# Patient Record
Sex: Female | Born: 1935 | Race: Black or African American | Hispanic: No | Smoking: Former smoker
Health system: Southern US, Community
[De-identification: ages and names within clinical notes are randomized; demographics above are authoritative.]

## PROBLEM LIST (undated history)

## (undated) DIAGNOSIS — D509 Iron deficiency anemia, unspecified: Secondary | ICD-10-CM

## (undated) DIAGNOSIS — C189 Malignant neoplasm of colon, unspecified: Secondary | ICD-10-CM

## (undated) DIAGNOSIS — I480 Paroxysmal atrial fibrillation: Secondary | ICD-10-CM

## (undated) DIAGNOSIS — I739 Peripheral vascular disease, unspecified: Secondary | ICD-10-CM

## (undated) DIAGNOSIS — N186 End stage renal disease: Secondary | ICD-10-CM

## (undated) DIAGNOSIS — I10 Essential (primary) hypertension: Secondary | ICD-10-CM

## (undated) DIAGNOSIS — E7800 Pure hypercholesterolemia, unspecified: Secondary | ICD-10-CM

## (undated) DIAGNOSIS — I639 Cerebral infarction, unspecified: Secondary | ICD-10-CM

## (undated) DIAGNOSIS — Z972 Presence of dental prosthetic device (complete) (partial): Secondary | ICD-10-CM

## (undated) DIAGNOSIS — K08109 Complete loss of teeth, unspecified cause, unspecified class: Secondary | ICD-10-CM

## (undated) DIAGNOSIS — K219 Gastro-esophageal reflux disease without esophagitis: Secondary | ICD-10-CM

## (undated) DIAGNOSIS — D72819 Decreased white blood cell count, unspecified: Secondary | ICD-10-CM

## (undated) DIAGNOSIS — Z973 Presence of spectacles and contact lenses: Secondary | ICD-10-CM

## (undated) HISTORY — PX: CATARACT EXTRACTION, BILATERAL: SHX1313

## (undated) HISTORY — DX: Malignant neoplasm of colon, unspecified: C18.9

## (undated) HISTORY — DX: Paroxysmal atrial fibrillation: I48.0

## (undated) HISTORY — DX: Pure hypercholesterolemia, unspecified: E78.00

## (undated) HISTORY — DX: Cerebral infarction, unspecified: I63.9

## (undated) HISTORY — PX: ABDOMINAL HYSTERECTOMY: SHX81

## (undated) HISTORY — PX: MULTIPLE TOOTH EXTRACTIONS: SHX2053

## (undated) HISTORY — DX: Essential (primary) hypertension: I10

## (undated) HISTORY — PX: EYE SURGERY: SHX253

## (undated) HISTORY — DX: Iron deficiency anemia, unspecified: D50.9

## (undated) HISTORY — DX: Decreased white blood cell count, unspecified: D72.819

---

## 2003-08-19 DIAGNOSIS — I639 Cerebral infarction, unspecified: Secondary | ICD-10-CM

## 2003-08-19 HISTORY — DX: Cerebral infarction, unspecified: I63.9

## 2005-02-12 ENCOUNTER — Ambulatory Visit (HOSPITAL_COMMUNITY): Admission: RE | Admit: 2005-02-12 | Discharge: 2005-02-12 | Payer: Self-pay | Admitting: Internal Medicine

## 2005-03-26 ENCOUNTER — Ambulatory Visit (HOSPITAL_COMMUNITY): Admission: RE | Admit: 2005-03-26 | Discharge: 2005-03-26 | Payer: Self-pay | Admitting: Internal Medicine

## 2006-04-22 ENCOUNTER — Ambulatory Visit (HOSPITAL_COMMUNITY): Admission: RE | Admit: 2006-04-22 | Discharge: 2006-04-22 | Payer: Self-pay | Admitting: Internal Medicine

## 2006-08-18 HISTORY — PX: COLONOSCOPY: SHX174

## 2006-08-18 HISTORY — PX: ESOPHAGOGASTRODUODENOSCOPY: SHX1529

## 2006-08-18 HISTORY — PX: HEMICOLECTOMY: SHX854

## 2007-05-06 ENCOUNTER — Ambulatory Visit (HOSPITAL_COMMUNITY): Admission: RE | Admit: 2007-05-06 | Discharge: 2007-05-06 | Payer: Self-pay | Admitting: Internal Medicine

## 2007-05-27 ENCOUNTER — Ambulatory Visit: Payer: Self-pay | Admitting: Internal Medicine

## 2007-05-28 ENCOUNTER — Ambulatory Visit (HOSPITAL_COMMUNITY): Admission: RE | Admit: 2007-05-28 | Discharge: 2007-05-28 | Payer: Self-pay | Admitting: Internal Medicine

## 2007-07-05 ENCOUNTER — Ambulatory Visit: Payer: Self-pay | Admitting: Gastroenterology

## 2007-07-05 ENCOUNTER — Inpatient Hospital Stay (HOSPITAL_COMMUNITY): Admission: AD | Admit: 2007-07-05 | Discharge: 2007-07-13 | Payer: Self-pay | Admitting: Internal Medicine

## 2007-07-06 ENCOUNTER — Encounter: Payer: Self-pay | Admitting: Gastroenterology

## 2007-07-06 ENCOUNTER — Ambulatory Visit: Payer: Self-pay | Admitting: Gastroenterology

## 2007-07-07 ENCOUNTER — Ambulatory Visit: Payer: Self-pay | Admitting: Internal Medicine

## 2007-07-09 ENCOUNTER — Encounter (INDEPENDENT_AMBULATORY_CARE_PROVIDER_SITE_OTHER): Payer: Self-pay | Admitting: General Surgery

## 2007-08-17 ENCOUNTER — Encounter (HOSPITAL_COMMUNITY): Admission: RE | Admit: 2007-08-17 | Discharge: 2007-08-18 | Payer: Self-pay | Admitting: Oncology

## 2007-08-17 ENCOUNTER — Ambulatory Visit (HOSPITAL_COMMUNITY): Payer: Self-pay | Admitting: Oncology

## 2007-08-19 HISTORY — PX: ESOPHAGOGASTRODUODENOSCOPY: SHX1529

## 2007-08-19 HISTORY — PX: COLONOSCOPY: SHX174

## 2007-08-19 HISTORY — PX: GIVENS CAPSULE STUDY: SHX5432

## 2007-08-24 ENCOUNTER — Ambulatory Visit (HOSPITAL_COMMUNITY): Admission: RE | Admit: 2007-08-24 | Discharge: 2007-08-24 | Payer: Self-pay | Admitting: Oncology

## 2007-08-30 ENCOUNTER — Encounter (HOSPITAL_COMMUNITY): Admission: RE | Admit: 2007-08-30 | Discharge: 2007-09-29 | Payer: Self-pay | Admitting: Oncology

## 2007-11-16 ENCOUNTER — Ambulatory Visit (HOSPITAL_COMMUNITY): Payer: Self-pay | Admitting: Oncology

## 2007-11-16 ENCOUNTER — Encounter (HOSPITAL_COMMUNITY): Admission: RE | Admit: 2007-11-16 | Discharge: 2007-12-16 | Payer: Self-pay | Admitting: Oncology

## 2007-12-24 ENCOUNTER — Ambulatory Visit: Payer: Self-pay | Admitting: Gastroenterology

## 2008-05-23 ENCOUNTER — Ambulatory Visit (HOSPITAL_COMMUNITY): Payer: Self-pay | Admitting: Oncology

## 2008-05-23 ENCOUNTER — Ambulatory Visit (HOSPITAL_COMMUNITY): Admission: RE | Admit: 2008-05-23 | Discharge: 2008-05-23 | Payer: Self-pay | Admitting: Internal Medicine

## 2008-07-03 ENCOUNTER — Ambulatory Visit: Payer: Self-pay | Admitting: Gastroenterology

## 2008-07-04 ENCOUNTER — Encounter: Payer: Self-pay | Admitting: Gastroenterology

## 2008-07-04 LAB — CONVERTED CEMR LAB
Basophils Relative: 1 % (ref 0–1)
Eosinophils Absolute: 0.2 10*3/uL (ref 0.0–0.7)
Lymphocytes Relative: 34 % (ref 12–46)
Lymphs Abs: 1.1 10*3/uL (ref 0.7–4.0)
MCHC: 29.1 g/dL — ABNORMAL LOW (ref 30.0–36.0)
MCV: 76.2 fL — ABNORMAL LOW (ref 78.0–100.0)
Neutro Abs: 1.7 10*3/uL (ref 1.7–7.7)
Platelets: 203 10*3/uL (ref 150–400)
RDW: 17.8 % — ABNORMAL HIGH (ref 11.5–15.5)
WBC: 3.2 10*3/uL — ABNORMAL LOW (ref 4.0–10.5)

## 2008-07-20 ENCOUNTER — Ambulatory Visit (HOSPITAL_COMMUNITY): Admission: RE | Admit: 2008-07-20 | Discharge: 2008-07-20 | Payer: Self-pay | Admitting: Gastroenterology

## 2008-07-20 ENCOUNTER — Encounter: Payer: Self-pay | Admitting: Gastroenterology

## 2008-07-20 ENCOUNTER — Ambulatory Visit: Payer: Self-pay | Admitting: Gastroenterology

## 2008-07-27 ENCOUNTER — Ambulatory Visit (HOSPITAL_COMMUNITY): Admission: RE | Admit: 2008-07-27 | Discharge: 2008-07-27 | Payer: Self-pay | Admitting: Gastroenterology

## 2008-07-28 ENCOUNTER — Ambulatory Visit: Payer: Self-pay | Admitting: Gastroenterology

## 2008-08-14 ENCOUNTER — Ambulatory Visit (HOSPITAL_COMMUNITY): Payer: Self-pay | Admitting: Oncology

## 2008-08-14 ENCOUNTER — Encounter (HOSPITAL_COMMUNITY): Admission: RE | Admit: 2008-08-14 | Discharge: 2008-09-13 | Payer: Self-pay | Admitting: Oncology

## 2008-08-18 HISTORY — PX: COLON SURGERY: SHX602

## 2008-10-09 ENCOUNTER — Encounter (HOSPITAL_COMMUNITY): Admission: RE | Admit: 2008-10-09 | Discharge: 2008-11-08 | Payer: Self-pay | Admitting: Oncology

## 2008-10-10 ENCOUNTER — Ambulatory Visit (HOSPITAL_COMMUNITY): Payer: Self-pay | Admitting: Oncology

## 2008-10-30 DIAGNOSIS — I10 Essential (primary) hypertension: Secondary | ICD-10-CM | POA: Insufficient documentation

## 2008-10-30 DIAGNOSIS — C189 Malignant neoplasm of colon, unspecified: Secondary | ICD-10-CM

## 2008-10-30 DIAGNOSIS — E119 Type 2 diabetes mellitus without complications: Secondary | ICD-10-CM

## 2008-10-30 DIAGNOSIS — Z87891 Personal history of nicotine dependence: Secondary | ICD-10-CM

## 2008-10-30 HISTORY — DX: Malignant neoplasm of colon, unspecified: C18.9

## 2008-11-01 ENCOUNTER — Encounter (INDEPENDENT_AMBULATORY_CARE_PROVIDER_SITE_OTHER): Payer: Self-pay | Admitting: *Deleted

## 2008-11-22 ENCOUNTER — Ambulatory Visit: Payer: Self-pay | Admitting: Gastroenterology

## 2008-11-22 DIAGNOSIS — K649 Unspecified hemorrhoids: Secondary | ICD-10-CM | POA: Insufficient documentation

## 2008-11-22 DIAGNOSIS — R319 Hematuria, unspecified: Secondary | ICD-10-CM

## 2008-11-23 ENCOUNTER — Encounter: Payer: Self-pay | Admitting: Gastroenterology

## 2008-11-23 LAB — CONVERTED CEMR LAB
Specific Gravity, Urine: 1.02 (ref 1.005–1.030)
Urobilinogen, UA: 0.2 (ref 0.0–1.0)
WBC, UA: NONE SEEN cells/hpf (ref <3)
pH: 5.5 (ref 5.0–8.0)

## 2008-12-01 ENCOUNTER — Encounter: Payer: Self-pay | Admitting: Gastroenterology

## 2008-12-04 ENCOUNTER — Ambulatory Visit (HOSPITAL_COMMUNITY): Admission: RE | Admit: 2008-12-04 | Discharge: 2008-12-04 | Payer: Self-pay | Admitting: Urology

## 2008-12-25 ENCOUNTER — Ambulatory Visit (HOSPITAL_COMMUNITY): Admission: RE | Admit: 2008-12-25 | Discharge: 2008-12-26 | Payer: Self-pay | Admitting: Urology

## 2008-12-25 ENCOUNTER — Encounter (INDEPENDENT_AMBULATORY_CARE_PROVIDER_SITE_OTHER): Payer: Self-pay | Admitting: Urology

## 2009-02-12 ENCOUNTER — Ambulatory Visit (HOSPITAL_COMMUNITY): Admission: RE | Admit: 2009-02-12 | Discharge: 2009-02-12 | Payer: Self-pay | Admitting: Urology

## 2009-02-12 ENCOUNTER — Encounter (INDEPENDENT_AMBULATORY_CARE_PROVIDER_SITE_OTHER): Payer: Self-pay | Admitting: Urology

## 2009-03-06 ENCOUNTER — Encounter: Payer: Self-pay | Admitting: Gastroenterology

## 2009-10-09 ENCOUNTER — Ambulatory Visit (HOSPITAL_COMMUNITY): Payer: Self-pay | Admitting: Oncology

## 2009-10-09 ENCOUNTER — Encounter (HOSPITAL_COMMUNITY): Admission: RE | Admit: 2009-10-09 | Discharge: 2009-11-08 | Payer: Self-pay | Admitting: Oncology

## 2009-11-06 ENCOUNTER — Encounter (HOSPITAL_COMMUNITY): Admission: RE | Admit: 2009-11-06 | Discharge: 2009-12-06 | Payer: Self-pay | Admitting: Oncology

## 2009-12-04 ENCOUNTER — Ambulatory Visit (HOSPITAL_COMMUNITY): Payer: Self-pay | Admitting: Oncology

## 2010-01-30 ENCOUNTER — Encounter: Payer: Self-pay | Admitting: Gastroenterology

## 2010-04-10 ENCOUNTER — Encounter (HOSPITAL_COMMUNITY): Admission: RE | Admit: 2010-04-10 | Discharge: 2010-05-10 | Payer: Self-pay | Admitting: Oncology

## 2010-04-10 ENCOUNTER — Ambulatory Visit (HOSPITAL_COMMUNITY): Payer: Self-pay | Admitting: Oncology

## 2010-07-02 ENCOUNTER — Encounter (HOSPITAL_COMMUNITY)
Admission: RE | Admit: 2010-07-02 | Discharge: 2010-08-01 | Payer: Self-pay | Source: Home / Self Care | Attending: Oncology | Admitting: Oncology

## 2010-07-02 ENCOUNTER — Ambulatory Visit (HOSPITAL_COMMUNITY): Payer: Self-pay | Admitting: Oncology

## 2010-07-08 ENCOUNTER — Ambulatory Visit (HOSPITAL_COMMUNITY): Admission: RE | Admit: 2010-07-08 | Discharge: 2010-07-08 | Payer: Self-pay | Admitting: Oncology

## 2010-07-18 ENCOUNTER — Encounter: Payer: Self-pay | Admitting: Gastroenterology

## 2010-09-07 ENCOUNTER — Other Ambulatory Visit (HOSPITAL_COMMUNITY): Payer: Self-pay | Admitting: Oncology

## 2010-09-07 ENCOUNTER — Encounter: Payer: Self-pay | Admitting: Internal Medicine

## 2010-09-07 DIAGNOSIS — M8580 Other specified disorders of bone density and structure, unspecified site: Secondary | ICD-10-CM

## 2010-09-07 DIAGNOSIS — Z859 Personal history of malignant neoplasm, unspecified: Secondary | ICD-10-CM

## 2010-09-07 DIAGNOSIS — D509 Iron deficiency anemia, unspecified: Secondary | ICD-10-CM

## 2010-09-08 ENCOUNTER — Encounter: Payer: Self-pay | Admitting: Internal Medicine

## 2010-09-08 ENCOUNTER — Encounter (HOSPITAL_COMMUNITY): Payer: Self-pay | Admitting: Oncology

## 2010-09-17 NOTE — Letter (Signed)
Summary: [facility]  [facility]   Imported By: Rexene Alberts 07/18/2010 09:44:05  _____________________________________________________________________  External Attachment:    Type:   Image     Comment:   External Document

## 2010-09-17 NOTE — Letter (Signed)
Summary: External Other  External Other   Imported By: Peggyann Shoals 01/30/2010 14:13:03  _____________________________________________________________________  External Attachment:    Type:   Image     Comment:   External Document

## 2010-10-01 ENCOUNTER — Other Ambulatory Visit (HOSPITAL_COMMUNITY): Payer: Medicare Other

## 2010-10-01 ENCOUNTER — Encounter (HOSPITAL_COMMUNITY): Payer: Medicare Other | Attending: Oncology

## 2010-10-01 DIAGNOSIS — E119 Type 2 diabetes mellitus without complications: Secondary | ICD-10-CM | POA: Insufficient documentation

## 2010-10-01 DIAGNOSIS — D509 Iron deficiency anemia, unspecified: Secondary | ICD-10-CM | POA: Insufficient documentation

## 2010-10-01 DIAGNOSIS — Z85038 Personal history of other malignant neoplasm of large intestine: Secondary | ICD-10-CM | POA: Insufficient documentation

## 2010-10-01 DIAGNOSIS — C180 Malignant neoplasm of cecum: Secondary | ICD-10-CM

## 2010-10-04 ENCOUNTER — Ambulatory Visit (HOSPITAL_COMMUNITY): Payer: Medicare Other | Admitting: Oncology

## 2010-10-04 DIAGNOSIS — D509 Iron deficiency anemia, unspecified: Secondary | ICD-10-CM

## 2010-10-04 DIAGNOSIS — C180 Malignant neoplasm of cecum: Secondary | ICD-10-CM

## 2010-10-29 LAB — CBC
HCT: 31.1 % — ABNORMAL LOW (ref 36.0–46.0)
Hemoglobin: 10.1 g/dL — ABNORMAL LOW (ref 12.0–15.0)
MCH: 26.6 pg (ref 26.0–34.0)
MCHC: 32.5 g/dL (ref 30.0–36.0)
MCV: 82 fL (ref 78.0–100.0)
Platelets: 197 10*3/uL (ref 150–400)
RBC: 3.8 MIL/uL — ABNORMAL LOW (ref 3.87–5.11)

## 2010-10-29 LAB — IRON AND TIBC
Saturation Ratios: 20 % (ref 20–55)
UIBC: 347 ug/dL

## 2010-10-31 LAB — CEA: CEA: 8.5 ng/mL — ABNORMAL HIGH (ref 0.0–5.0)

## 2010-11-01 LAB — IRON AND TIBC
Iron: 97 ug/dL (ref 42–135)
Saturation Ratios: 23 % (ref 20–55)
TIBC: 421 ug/dL (ref 250–470)

## 2010-11-01 LAB — CBC
RDW: 15.1 % (ref 11.5–15.5)
WBC: 4.2 10*3/uL (ref 4.0–10.5)

## 2010-11-01 LAB — CEA: CEA: 12.4 ng/mL — ABNORMAL HIGH (ref 0.0–5.0)

## 2010-11-01 LAB — FERRITIN: Ferritin: 279 ng/mL (ref 10–291)

## 2010-11-05 LAB — CBC
MCHC: 32.8 g/dL (ref 30.0–36.0)
MCV: 80.3 fL (ref 78.0–100.0)
RBC: 4.01 MIL/uL (ref 3.87–5.11)
WBC: 4.1 10*3/uL (ref 4.0–10.5)

## 2010-11-05 LAB — FERRITIN: Ferritin: 363 ng/mL — ABNORMAL HIGH (ref 10–291)

## 2010-11-05 LAB — IRON AND TIBC
Iron: 96 ug/dL (ref 42–135)
Saturation Ratios: 23 % (ref 20–55)
TIBC: 426 ug/dL (ref 250–470)

## 2010-11-06 LAB — CBC
Hemoglobin: 10.2 g/dL — ABNORMAL LOW (ref 12.0–15.0)
MCHC: 32.2 g/dL (ref 30.0–36.0)
RDW: 15.2 % (ref 11.5–15.5)

## 2010-11-06 LAB — IRON AND TIBC: Iron: 60 ug/dL (ref 42–135)

## 2010-11-06 LAB — FERRITIN: Ferritin: 31 ng/mL (ref 10–291)

## 2010-11-25 LAB — CBC
HCT: 31.1 % — ABNORMAL LOW (ref 36.0–46.0)
MCV: 78.2 fL (ref 78.0–100.0)
RBC: 3.97 MIL/uL (ref 3.87–5.11)
WBC: 3.9 10*3/uL — ABNORMAL LOW (ref 4.0–10.5)

## 2010-11-25 LAB — BASIC METABOLIC PANEL
BUN: 32 mg/dL — ABNORMAL HIGH (ref 6–23)
Chloride: 111 mEq/L (ref 96–112)
Potassium: 4 mEq/L (ref 3.5–5.1)

## 2010-11-26 LAB — BASIC METABOLIC PANEL
BUN: 12 mg/dL (ref 6–23)
BUN: 27 mg/dL — ABNORMAL HIGH (ref 6–23)
Chloride: 111 mEq/L (ref 96–112)
Chloride: 114 mEq/L — ABNORMAL HIGH (ref 96–112)
Glucose, Bld: 109 mg/dL — ABNORMAL HIGH (ref 70–99)
Glucose, Bld: 131 mg/dL — ABNORMAL HIGH (ref 70–99)
Potassium: 3.4 mEq/L — ABNORMAL LOW (ref 3.5–5.1)
Potassium: 4.1 mEq/L (ref 3.5–5.1)

## 2010-11-26 LAB — CBC
HCT: 30.4 % — ABNORMAL LOW (ref 36.0–46.0)
HCT: 32.1 % — ABNORMAL LOW (ref 36.0–46.0)
MCV: 76.8 fL — ABNORMAL LOW (ref 78.0–100.0)
MCV: 78 fL (ref 78.0–100.0)
Platelets: 153 10*3/uL (ref 150–400)
Platelets: 188 10*3/uL (ref 150–400)
RDW: 16.2 % — ABNORMAL HIGH (ref 11.5–15.5)
RDW: 16.3 % — ABNORMAL HIGH (ref 11.5–15.5)
WBC: 3.9 10*3/uL — ABNORMAL LOW (ref 4.0–10.5)

## 2010-12-03 LAB — CBC
HCT: 33.2 % — ABNORMAL LOW (ref 36.0–46.0)
Hemoglobin: 10.9 g/dL — ABNORMAL LOW (ref 12.0–15.0)
MCV: 75.8 fL — ABNORMAL LOW (ref 78.0–100.0)
Platelets: 203 10*3/uL (ref 150–400)
WBC: 3 10*3/uL — ABNORMAL LOW (ref 4.0–10.5)

## 2010-12-11 ENCOUNTER — Ambulatory Visit (HOSPITAL_COMMUNITY)
Admission: RE | Admit: 2010-12-11 | Discharge: 2010-12-11 | Disposition: A | Discharge status: Home / Self Care | Payer: Medicare Other | Source: Ambulatory Visit | Attending: Internal Medicine | Admitting: Internal Medicine

## 2010-12-11 ENCOUNTER — Other Ambulatory Visit (HOSPITAL_COMMUNITY): Payer: Self-pay | Admitting: Internal Medicine

## 2010-12-11 DIAGNOSIS — M25439 Effusion, unspecified wrist: Secondary | ICD-10-CM | POA: Insufficient documentation

## 2010-12-11 DIAGNOSIS — M25539 Pain in unspecified wrist: Secondary | ICD-10-CM | POA: Insufficient documentation

## 2010-12-11 DIAGNOSIS — M899 Disorder of bone, unspecified: Secondary | ICD-10-CM | POA: Insufficient documentation

## 2010-12-11 DIAGNOSIS — M25639 Stiffness of unspecified wrist, not elsewhere classified: Secondary | ICD-10-CM | POA: Insufficient documentation

## 2010-12-11 DIAGNOSIS — M25531 Pain in right wrist: Secondary | ICD-10-CM

## 2010-12-11 DIAGNOSIS — M19039 Primary osteoarthritis, unspecified wrist: Secondary | ICD-10-CM | POA: Insufficient documentation

## 2010-12-25 ENCOUNTER — Other Ambulatory Visit (HOSPITAL_COMMUNITY): Payer: Self-pay | Admitting: Oncology

## 2010-12-25 ENCOUNTER — Encounter (HOSPITAL_COMMUNITY): Payer: Medicare Other

## 2010-12-25 ENCOUNTER — Encounter (HOSPITAL_COMMUNITY): Payer: Medicare Other | Admitting: Oncology

## 2010-12-25 DIAGNOSIS — Z85038 Personal history of other malignant neoplasm of large intestine: Secondary | ICD-10-CM | POA: Insufficient documentation

## 2010-12-25 DIAGNOSIS — E119 Type 2 diabetes mellitus without complications: Secondary | ICD-10-CM | POA: Insufficient documentation

## 2010-12-25 DIAGNOSIS — Z79899 Other long term (current) drug therapy: Secondary | ICD-10-CM | POA: Insufficient documentation

## 2010-12-25 DIAGNOSIS — D509 Iron deficiency anemia, unspecified: Secondary | ICD-10-CM | POA: Insufficient documentation

## 2010-12-25 DIAGNOSIS — C180 Malignant neoplasm of cecum: Secondary | ICD-10-CM

## 2010-12-25 LAB — CBC
Hemoglobin: 9.7 g/dL — ABNORMAL LOW (ref 12.0–15.0)
MCH: 24.9 pg — ABNORMAL LOW (ref 26.0–34.0)
MCHC: 31 g/dL (ref 30.0–36.0)
MCV: 80.5 fL (ref 78.0–100.0)

## 2010-12-25 LAB — IRON AND TIBC: UIBC: 285 ug/dL

## 2010-12-25 LAB — FERRITIN: Ferritin: 269 ng/mL (ref 10–291)

## 2010-12-25 LAB — URIC ACID: Uric Acid, Serum: 10 mg/dL — ABNORMAL HIGH (ref 2.4–7.0)

## 2010-12-25 LAB — CEA: CEA: 10.2 ng/mL — ABNORMAL HIGH (ref 0.0–5.0)

## 2010-12-27 ENCOUNTER — Ambulatory Visit (HOSPITAL_COMMUNITY): Payer: Medicare Other

## 2010-12-27 ENCOUNTER — Other Ambulatory Visit (HOSPITAL_COMMUNITY): Payer: Self-pay | Admitting: Oncology

## 2010-12-27 ENCOUNTER — Encounter (HOSPITAL_COMMUNITY): Payer: Medicare Other | Attending: Oncology | Admitting: Oncology

## 2010-12-27 DIAGNOSIS — R634 Abnormal weight loss: Secondary | ICD-10-CM

## 2010-12-27 DIAGNOSIS — C180 Malignant neoplasm of cecum: Secondary | ICD-10-CM

## 2010-12-27 DIAGNOSIS — C801 Malignant (primary) neoplasm, unspecified: Secondary | ICD-10-CM

## 2010-12-27 LAB — CREATININE, SERUM
Creatinine, Ser: 3.19 mg/dL — ABNORMAL HIGH (ref 0.4–1.2)
GFR calc Af Amer: 17 mL/min — ABNORMAL LOW (ref >60)

## 2010-12-31 ENCOUNTER — Other Ambulatory Visit (HOSPITAL_COMMUNITY): Payer: Medicare Other

## 2010-12-31 NOTE — Op Note (Signed)
NAME:  DREYA, BUHRMAN              ACCOUNT NO.:  [id number]   MEDICAL RECORD NO.:  [id number]          PATIENT TYPE:  AMB   LOCATION:  DAY                           FACILITY:  [facility]   PHYSICIAN:  Kassie Mends, M.D.      DATE OF BIRTH:  [birth year]   DATE OF PROCEDURE:  DATE OF DISCHARGE:                               OPERATIVE REPORT   REFERRING Annajulia Lewing:  Tesfaye D. Felecia Shelling, MD   PRIMARY ONCOLOGIST:  Ladona Horns. Neijstrom, MD   PROCEDURE:  1. Ileocolonoscopy with snare cautery polypectomy.  2. Esophagogastroduodenoscopy with cold forceps biopsy of the gastric      and duodenal mucosa.   INDICATION FOR EXAM:  [name] is a 77 year old female who continues  to have intermittent rectal bleeding.  She was diagnosed with colon  cancer in November 2008.  She also was noted to have microcytic anemia.  Her hemoglobin was noted to be 9.8 with an MCV of 73.9.  She denies any  problem swallowing, nausea, vomiting, or abdominal pain.  She is  chronically maintained on aspirin 81 mg daily with no PPI or H2 blocker.   FINDINGS:  1. Normal neoterminal ileum, approximately 20 cm visualized.  2. A 6-mm sessile rectal polyp removed via snare cautery.  3. Rare sigmoid colon diverticula.  Otherwise, no masses, inflammatory      changes, or AVMs seen.  4. Normal anastomosis.  5. Normal esophagus without evidence of Barrett's, mass, erosion,      ulceration, or stricture.  6. Multiple erosions seen in the antrum with occasional evidence of      oozing.  No ulcerations.  Biopsies obtained via cold forceps to      evaluate for H. pylori gastritis.  7. Normal duodenal bulb and second portion of the duodenum.  Biopsies      obtained via cold forceps to evaluate for celiac sprue as an      etiology for microcytic anemia.  8. Moderate internal hemorrhoids.  Otherwise, normal retroflex view of      the rectum.   DIAGNOSES:  1. Rectal polyp.  2. Mild sigmoid colon diverticulosis.  3. Moderate internal  hemorrhoids.  4. Moderate gastritis.   RECOMMENDATIONS:  1. Will call Ms. Apgar with the results of her biopsies.  2. She should stop aspirin until August 21, 2008.  She should begin      Prilosec 20 mg daily and continue it indefinitely while taking      aspirin.  3. We will measure her ferritin today.  4. No NSAIDs for 14 days.  No anticoagulation for 5 days.  5. She was given a handout on gastritis, high-fiber diet, polyps,      diverticulosis, and hemorrhoids.   MEDICATIONS:  1. Demerol 75 mg IV.  2. Versed 5 mg IV.   PROCEDURE TECHNIQUE:  Physical exam was performed.  Informed consent was  obtained from the patient after explaining the benefits, risks, and  alternatives to the procedure.  The patient was connected to the monitor  and placed in the left lateral position.  Continuous oxygen was provided  by nasal cannula.  IV medicine administered with an indwelling cannula.  After administration of sedation and rectal exam, the patient's rectum  was intubated, and the scope was advanced under direct visualization to  the neoterminal ileum.  The scope was removed slowly by carefully  examining the color, texture, anatomy, and integrity of the mucosa on  the way out.   After the colonoscopy, the patient's esophagus was intubated with a  diagnostic gastroscope, and the scope was advanced under direct  visualization to the second portion of the duodenum.  The scope was  removed slowly by carefully examining the color, texture, anatomy, and  integrity of the mucosa on the way out.  The patient was recovered in  Endoscopy and discharged home in satisfactory condition.   PATH:  Benign colonic mucosa. Reactive gastropathy. Nl duodenum.      Kassie Mends, M.D.  Electronically Signed     SM/MEDQ  D:  07/20/2008  T:  07/20/2008  Job:  [id number]   cc:   Ladona Horns. Mariel Sleet, MD  Fax: [phone]   Tesfaye D. Felecia Shelling, MD  Fax: [phone]

## 2010-12-31 NOTE — Consult Note (Signed)
NAMECHANON, Destiny Boyd              ACCOUNT NO.:  [id number]   MEDICAL RECORD NO.:  [id number]          PATIENT TYPE:  INP   LOCATION:  IC04                          FACILITY:  [facility]   PHYSICIAN:  Dalia Heading, M.D.  DATE OF BIRTH:  [birth year]   DATE OF CONSULTATION:  07/08/2007  DATE OF DISCHARGE:                                 CONSULTATION   REASON FOR CONSULTATION:  Cecal carcinoma.   HISTORY OF PRESENT ILLNESS:  The patient is a 77 year old black female  who presented to the emergency department with generalized malaise, was  noted to be severely anemic.  She underwent a colonoscopy by Dr. Cira Servant  and was found to have a cecal mass.  This was biopsy positive for  adenocarcinoma.  Since her colonoscopy, she has started having episodes  of hematochezia.  She had one episode of the drop in blood pressure,  thus she was transferred to the intensive care unit.  While in the  intensive care unit, her blood pressure and pulse have been stable.  She  has had several more episodes of hematochezia.  The patient states that  she feels okay and denies any significant abdominal pain, nausea,  vomiting.   PAST MEDICAL HISTORY:  Includes hypertension and insulin-dependent  diabetes mellitus.   PAST SURGICAL HISTORY:  Hysterectomy.   CURRENT MEDICATIONS:  As listed on the chart.   ALLERGIES:  NO KNOWN DRUG ALLERGIES.   REVIEW OF SYSTEMS:  The patient denies drinking or smoking.  She denies  any recent chest pain, MI, CVA, or known bleeding disorders.   PHYSICAL EXAMINATION:  GENERAL:  The patient is a pleasant black female  in no acute distress.  VITAL SIGNS:  Blood pressure is 115/70, pulse 70, respirations 18.  ABDOMEN:  Her abdomen is soft.  Periumbilical incisional hernia is  present.  No hepatosplenomegaly, masses, rigidity are noted.   LABORATORY DATA:  White blood cell count 9.1, hemoglobin 7.8, hematocrit  24.3, platelet count is now 104, though it had been as low as 68.  __________  was remarkable for potassium of 3.3, BUN 22, creatinine 1.4  CEA level is pending.  Liver profile done at the time of admission was  unremarkable.   IMPRESSION:  Cecal mass with post procedure hemorrhage.  She appears to  be stable at this point.   PLAN:  I have ordered a transfusion of platelets which are arriving from  another city in the next few hours.  I would like to see her hemoglobin  up to 10.  She will need a right hemicolectomy as well as incisional  herniorrhaphy.  This  will be planned for tomorrow.  I did make aware to the patient that she  does have a cancer.  The risks and benefits of the procedure including  bleeding, infection and cardiopulmonary difficulties were fully  explained to the patient, who gave informed consent.      Dalia Heading, M.D.  Electronically Signed     MAJ/MEDQ  D:  07/08/2007  T:  07/08/2007  Job:  [id number]   cc:   Tesfaye D.  Felecia Shelling, MD  Fax: [phone]   Kassie Mends, M.D.  [address]  [place] , [place] [address]

## 2010-12-31 NOTE — Op Note (Signed)
Destiny Boyd, Destiny Boyd              ACCOUNT NO.:  [id number]   MEDICAL RECORD NO.:  [id number]          PATIENT TYPE:  AMB   LOCATION:  DAY                           FACILITY:  [facility]   PHYSICIAN:  Dennie Maizes, M.D.   DATE OF BIRTH:  April 10, 1934   DATE OF PROCEDURE:  02/12/2009  DATE OF DISCHARGE:                               OPERATIVE REPORT   PREOPERATIVE DIAGNOSIS:  Carcinoma of bladder.   POSTOPERATIVE DIAGNOSIS:  Carcinoma of bladder.   OPERATIVE PROCEDURE:  Cystoscopy, biopsy of bladder tumor, and  fulguration of bladder tumor.   ANESTHESIA:  General.   SURGEON:  Dennie Maizes, MD   COMPLICATIONS:  None.   ESTIMATED BLOOD LOSS:  Minimal.   DRAINS:  18-French Foley catheter in the bladder.   SPECIMEN:  Bladder tumor.   INDICATIONS FOR THE PROCEDURE:  This 77 year old female was evaluated  for intermittent gross hematuria and found to have 2.5 cm size papillary  tumor on the post lateral wall of the bladder on the right side.  She  has undergone transurethral  resection of bladder tumor about a month  ago.  This was done under spinal anesthesia and deep resection could not  be done as the patient had obturator nerve stimulation and adductor  spasms at that time.  The patient was brought back to the operating room  today for repeat transurethral  resection of bladder tumor under general  anesthesia with muscle relaxation.   DESCRIPTION OF THE PROCEDURE:  General anesthesia was induced and muscle  relaxants were given.  The patient was then placed on the table in the  dorsal lithotomy position.  The lower abdomen and genitalia were prepped  and draped in a sterile fashion.  Cystoscopy was done with 25-French  scope.  The trigone ureteral orifices were normal.  There was some  residual tumor on the right lateral wall and the posterior side about 1  cm in size.  The tumor was flat and not amendable for transurethral  resection.  Cold cup biopsies of the lesion was done  x2.  The cystoscope  was then removed.   A 28-French  resectoscope with continuous bladder irrigation was then  inserted into the bladder.  The biopsy sites were fulgurated thoroughly.  The tumor was thoroughly fulgurated.  There was no active bleeding at  this time.  The rest of the bladder mucosa was normal.  A 18-French  Foley catheter was inserted into the bladder.  The patient was  transferred to the PACU in a satisfactory condition.      Dennie Maizes, M.D.  Electronically Signed     SK/MEDQ  D:  02/12/2009  T:  02/12/2009  Job:  [id number]   cc:   Tesfaye D. Felecia Shelling, MD  Fax: [phone]   Kassie Mends, M.D.  [address]  [place] , [place] [address]

## 2010-12-31 NOTE — Op Note (Signed)
[name], Destiny Boyd              ACCOUNT NO.:  [id number]   MEDICAL RECORD NO.:  [id number]          PATIENT TYPE:  INP   LOCATION:  A301                          FACILITY:  [facility]   PHYSICIAN:  Kassie Mends, M.D.      DATE OF BIRTH:  1934-04-01   DATE OF PROCEDURE:  07/06/2007  DATE OF DISCHARGE:                               OPERATIVE REPORT   REFERRING PHYSICIAN:  Tesfaye D. Felecia Shelling, MD   PROCEDURES:  1. Colonoscopy with snare cautery polypectomy.  2. Esophagogastroduodenoscopy.   FINDINGS:  1. A 3-4 cm cecal polyp, sessile, which was partially removed via      snare cautery.  Otherwise, no inflammatory changes, diverticula or      AVMs.  2. Normal retroflexed view of the rectum.  3. Normal esophagus without evidence of Barrett's, mass, erosions,      ulceration or stricture.  4. Normal stomach, duodenal bulb and second portion of the duodenum.   DIAGNOSIS:  Ms. Boot profound iron-deficiency anemia is likely  secondary to her cecal polyp.   RECOMMENDATIONS:  1. Advance diet.  2. Avoid aspirin until her polyp is removed.  3. No NSAIDs or anticoagulation for 7 days.  4. Will call [name] with the results of her biopsies.  If her      biopsies show an adenomatous polyp, then she will be referred to      [facility] for endoscopic      mucosal resection.  If her polyp shows cecal adenocarcinoma, then      she should have a right hemicolectomy.   PROCEDURE TECHNIQUE:  Physical exam was performed and informed consent  was obtained from the patient after explaining the benefits, risks and  alternatives to the procedure.  The patient was connected to the monitor  and placed in the left lateral position.  Continuous oxygen was provided  by nasal cannula and IV medicine administered through an indwelling  cannula.  After administration of sedation and rectal exam, the  patient's rectum was intubated and the scope was advanced under  direct  visualization to the cecum.  The scope was removed slowly by carefully  examining the color, texture, anatomy and integrity of the mucosa on the  way out.   After the colonoscopy, the patient's esophagus was intubated and the  diagnostic gastroscope was passed under direct visualization to the  second portion of the duodenum.  The scope was removed slowly by  carefully examining the color, texture, anatomy and integrity  of the  mucosa on the way out.  The patient was recovered in endoscopy and  discharged to the floor in satisfactory condition.      Kassie Mends, M.D.  Electronically Signed     SM/MEDQ  D:  07/06/2007  T:  07/06/2007  Job:  [id number]

## 2010-12-31 NOTE — Op Note (Signed)
NAMEMERION, CATON              ACCOUNT NO.:  [id number]   MEDICAL RECORD NO.:  [id number]          PATIENT TYPE:  OIB   LOCATION:  A314                          FACILITY:  [facility]   PHYSICIAN:  Dennie Maizes, M.D.   DATE OF BIRTH:  [birth year]   DATE OF PROCEDURE:  DATE OF DISCHARGE:                               OPERATIVE REPORT   PREOPERATIVE DIAGNOSIS:  Hematuria, bladder tumor (2.5 cm).   POSTOPERATIVE DIAGNOSIS:  Hematuria, bladder tumor (2.5 cm).   OPERATIVE PROCEDURE:  Cystoscopy, multiple bladder biopsies, and  transurerthral  resection of bladder tumor (2.5 cm).   ANESTHESIA:  Spinal.   SURGEON:  Dennie Maizes, MD   COMPLICATIONS:  None.   ESTIMATED BLOOD LOSS:  Minimal.   DRAINS:  A 22-French triple-lumen Foley catheter with 30 mL balloon in  the bladder.   SPECIMENS:  Biopsy x3, bladder tumor x1.   INDICATIONS FOR THE PROCEDURE:  This 77 year old female was evaluated  for intermittent gross hematuria.  Evaluation revealed a 2.5-cm  papillary bladder tumor in the right lateral wall of the bladder.  The  patient was taken to the operating room today for cystoscopy, multiple  bladder biopsies, and transurethral resection of bladder tumor.   DESCRIPTION OF THE PROCEDURE:  Spinal anesthesia was induced, and the  patient was placed on the OR table in the dorsal lithotomy position.  Lower abdomen and genitalia were prepped and draped in a sterile  fashion.  Cystoscopy was done with a 20-French scope.  Prior to this,  the urethra needed to be dilated up to 30-French with straight metal  sounds.  Cystoscopy revealed normal trigone and ureteral orifices.  The  rest of the papillary tumor on the right lateral wall of the bladder  about 2.5 cm in size.  The rest of bladder mucosa was normal.   With a rigid biopsy forceps, mucosal biopsies were done from the right  lateral wall, posterior wall, and left lateral wall of the bladder.  The  cystoscope was then removed.   A 28-French Iglesias resectoscope with continuous bladder irrigation was  then inserted into the bladder.  The biopsy sites were fulgurated and  hemostasis was obtained.  Tumor was then resected up to the muscle base.  During this process, the patient had obturator nerve stimulation with  adductor contractions.  I could not resect the base of the tumor due to  the adductor spasms.  I fulgurated the bladder tumor base thoroughly.  There was no active bleeding at this time.  The instruments were  removed.  A 22-French triple-lumen Foley catheter with 30 mL balloon was  inserted to the bladder and continuous bladder irrigation was started.  The returns were clear.  The patient was transferred to the PACU in a  satisfactory condition.   Due to obturator nerve stimulation and the adductor spasms, I could not  resect the base of the tumor completely.  I plan to bring the patient to  the OR in about 6 weeks and re-resect the tumor base under general  anesthesia with muscle relaxation.      Dennie Maizes,  M.D.  Electronically Signed     SK/MEDQ  D:  12/25/2008  T:  12/26/2008  Job:  [id number]   cc:   Tesfaye D. Felecia Shelling, MD  Fax: [phone]   Kassie Mends, M.D.  [address]  [place] , [place] [address]

## 2010-12-31 NOTE — H&P (Signed)
NAME:  Destiny Boyd, Destiny Boyd              ACCOUNT NO.:  [id number]   MEDICAL RECORD NO.:  [id number]          PATIENT TYPE:  INP   LOCATION:  A301                          FACILITY:  [facility]   PHYSICIAN:  Tesfaye D. Felecia Shelling, MD   DATE OF BIRTH:  22-Mar-1934   DATE OF ADMISSION:  07/05/2007  DATE OF DISCHARGE:  LH                              HISTORY & PHYSICAL   CHIEF COMPLAINT:  Low hemoglobin and hematocrit.   HISTORY OF PRESENT ILLNESS:  This is a 77 year old female patient with  known case of hypertension and diabetes mellitus who was admitted due to  low hemoglobin and hematocrit.  The patient was recently seen in the  cardiology clinic for complaint of shortness of breath.  She was  evaluated, and routine lab test were done.  Her lab test showed a very  low hemoglobin and hematocrit.  The labs were faxed to me, and I  repeated them in the office.  Her hemoglobin was like 4.7 with  hematocrit of 16.  The patient had no symptoms.  She denies nausea,  vomiting, melena, or hematemesis.  The patient, however, continued to  have intermittent shortness of breath and some weakness.  Her stool  guaiac done in the office was positive.  The patient was then admitted  for transfusion and further evaluation.   REVIEW OF SYSTEMS:  The patient has no fever, chills, cough, nausea,  vomiting, abdominal pain, dysuria, urgency or frequency of urination.   PAST MEDICAL HISTORY:  1. Hypertension.  2. Diabetes mellitus.  3. Status post CVA.   CURRENT MEDICATIONS:  1. Actos 45 mg p.o. daily.  2. Glucophage 500 mg 2 tablets p.o. b.i.d.  3. Glucotrol XL 10 mg p.o. daily.  4. Diovan and HCTZ 160/25 1 tablet p.o. daily.  5. Lipitor 10 mg p.o. daily.  6. Atenolol 100 mg p.o. daily.  7. Aspirin 81 mg daily.  8. Plavix 75 mg p.o. daily.   SOCIAL HISTORY:  The patient is single.  No history of alcohol, tobacco,  or substance abuse.   PHYSICAL EXAMINATION:  GENERAL:  The patient is alert, awake, and not in  any acute distress.  VITAL SIGNS:  Blood pressure 124/59, pulse 71, respiratory rate 20,  temperature 99.2 degrees Fahrenheit.  HEENT:  Pupils are equal and reactive.  NECK:  Supple.  CHEST:  Decreased air entry, few rhonchi.  CARDIOVASCULAR:  First and second heart sounds heard.  There is a  systolic murmur, grade 2/6, no gallop.  ABDOMEN:  Soft and lax.  Bowel  sounds positive.  No mass or organomegaly.  EXTREMITIES:  No leg edema.   LABORATORY DATA ON ADMISSION:  CBC:  WBC 4.6, hemoglobin 4.7, hematocrit  16.5, and platelets 81.  Sodium 140, potassium 3.8, chloride 107, carbon  dioxide 27, glucose 137, BUN 22, creatinine 1.19, calcium 9.1.   ASSESSMENT:  1. Severe microcytic anemia secondary to chronic gastrointestinal      bleed.  2. Diabetes mellitus, type 2.  3. Hypertension.   PLAN:  Will type, cross match, and transfuse 3 units of packed red blood  cells.  Will do GI consult.  Will do serum ferritin, iron, vitamin B,  and folate level.  Will continue the patient on her regular medications.      Tesfaye D. Felecia Shelling, MD  Electronically Signed     TDF/MEDQ  D:  07/06/2007  T:  07/06/2007  Job:  [id number]

## 2010-12-31 NOTE — Procedures (Signed)
NAMEREASE, SWINSON              ACCOUNT NO.:  [id number]   MEDICAL RECORD NO.:  [id number]          PATIENT TYPE:  OUT   LOCATION:  RAD                           FACILITY:  [facility]   PHYSICIAN:  Pricilla Riffle, MD, FACCDATE OF BIRTH:  06/13/1934   DATE OF PROCEDURE:  05/28/2007  DATE OF DISCHARGE:                                ECHOCARDIOGRAM   REFERRING PHYSICIAN:  Tesfaye D. Felecia Shelling, MD.   TEST INDICATION:  The patient is a 77 year old with shortness of breath,  edema, test to evaluate LV function.   2-D ECHO WITH ECHO DOPPLER:  The left ventricle is normal in size with  an end-diastolic dimension of 47 mm.  The interventricular septum and  posterior wall are minimally thickened at 13 mm of the septum, posterior  wall normal at 11 mm.   The left atrium is grossly normal, right atrium and right ventricle are  normal. Right atrium and right ventricle are normal in size.  Aortic  root is normal at in size at 31 mm.   The aortic valve is mildly sclerotic.  There is no stenosis.  No  insufficiency.   Mitral valve is mildly thickened without stenosis. There is mild  insufficiency (1/4). Pulmonic valve is normal with no insufficiency.  Tricuspid valve is normal with trace insufficiency.   Overall LV systolic function is normal with an LVEF of 60%.  RVEF is  normal.   No pericardial effusion is seen.  IVC is slightly prominent.      Pricilla Riffle, MD, [facility]  Electronically Signed     PVR/MEDQ  D:  05/28/2007  T:  05/29/2007  Job:  [id number]

## 2010-12-31 NOTE — Assessment & Plan Note (Signed)
NAMEZONYA, GUDGER               CHART#:  [id number]   DATE:  12/24/2007                       DOB:  01/10/1934   CHIEF COMPLAINT:  Recent diarrhea.   PRIMARY CARE PHYSICIAN:  Tesfaye D. Felecia Shelling, M.D.   SUBJECTIVE:  Destiny Boyd is a 77 year old African American female who  underwent a right hemicolectomy with resection of the terminal ileum on  July 09, 2007, because of a cecal adenocarcinoma detected at time of  colonoscopy by Dr. Cira Servant on July 06, 2007.  A colonoscopy was being  done for iron deficiency anemia.  Destiny Boyd had tumor invading, but not  through muscularis mucosa, though with lymphovascular space invasion  present, the mucosal margins were negative for tumor.  Destiny Boyd had a total  of 21 lymph nodes which were negative for tumor.  Destiny Boyd has been seen by  Dr. Mariel Sleet.  The patient states that Destiny Boyd was not offered any adjuvant  therapy.  Destiny Boyd is generally following up with him every 3 months per her  report.  Her last CEA on November 16, 2007, was 7.6.  In December, it was  7.8.  This is mildly elevated with the upper limits of normal being 5.   Destiny Boyd states Destiny Boyd recently saw Dr. Felecia Shelling because Destiny Boyd went to the bathroom  and when Destiny Boyd urinated Destiny Boyd saw blood in the toilet.  Destiny Boyd thought it was  because Destiny Boyd was doing some heavy lifting.  Destiny Boyd says Destiny Boyd had some blood  work and urinalysis done through his office.  We are trying to retrieve  his records.  Destiny Boyd says Destiny Boyd was having diarrhea ever since her surgery,  but about 3 weeks ago it when away.  Now Destiny Boyd is having one daily bowel  movement.  Destiny Boyd denies any blood in the stool or melena.  Destiny Boyd denies any  abdominal pain.  Her appetite is good.  Her weight has been stable and  Destiny Boyd says Destiny Boyd has actually been gaining some weight.  Destiny Boyd is down about  19 pounds since her surgery, but Destiny Boyd states Destiny Boyd lost all that around the  time of her surgery.  Destiny Boyd denies any nausea or vomiting.   CURRENT MEDICATIONS:  See updated list.   ALLERGIES:  No  known drug allergies.   PHYSICAL EXAMINATION:  VITAL SIGNS:  Weight 170 pounds, height 5 feet 3-  1/2 inches, temperature 97.7, blood pressure 144/80, pulse 56.  GENERAL:  Pleasant, well-nourished, well-developed black female in no acute  distress.  SKIN:  Warm and dry.  No jaundice.  HEENT:  Sclerae are  nonicteric.  Oropharyngeal mucosa moist and pink.  CHEST:  Lungs are clear to auscultation.  CARDIAC:  Reveals regular rate  and rhythm.  ABDOMEN:  Positive bowel sounds.  Abdomen is soft,  nontender, nondistended.  No organomegaly or masses.  No rebound or  guarding.  LOWER EXTREMITIES:  No edema.   IMPRESSION:  [name] is a 73-year lady with a history of cecal  adenocarcinoma status post right hemicolectomy and resection of part of  the terminal ileum with 21 negative lymph nodes.  Tumor measured 2.3 cm.  Destiny Boyd did have postoperative diarrhea.  This has since resolved.  Destiny Boyd  seems to be doing fairly well.  Recently Destiny Boyd had blood in the toilet  after urination.  This does not seem to be  associated with her bowels.  This was an isolated event and associated with heavy lifting.  I would  like to review her recent workup that was done at Dr. Letitia Neri office.  Will also review the last office note from Dr. Mariel Sleet.   I have discussed this case with Dr. Kassie Mends.  At this point in time,  there is no recommendation for a colonoscopy.  However, Destiny Boyd will need to  have a follow up colonoscopy 1 year from diagnosis which would make that  November 2009.   PLAN:  1. Retrieve records from Dr. Felecia Shelling including office note and      urinalysis and labs.  2. Retrieve office note from Dr. Mariel Sleet.  3. Colonoscopy in November 2009 for surveillance given history of      colon cancer.       Tana Coast, P.A.  Electronically Signed     Kassie Mends, M.D.  Electronically Signed    LL/MEDQ  D:  12/24/2007  T:  12/24/2007  Job:  [id number]   cc:   Tesfaye D. Felecia Shelling, MD

## 2010-12-31 NOTE — Op Note (Signed)
NAMETEMPESTT, SILBA              ACCOUNT NO.:  [id number]   MEDICAL RECORD NO.:  [id number]          PATIENT TYPE:  INP   LOCATION:  A313                          FACILITY:  [facility]   PHYSICIAN:  Dalia Heading, M.D.  DATE OF BIRTH:  1933-10-28   DATE OF PROCEDURE:  07/09/2007  DATE OF DISCHARGE:                               OPERATIVE REPORT   PREOPERATIVE DIAGNOSIS:  Cecal carcinoma, incisional hernia.   POSTOPERATIVE DIAGNOSIS:  Cecal carcinoma, incisional hernia.   PROCEDURE:  1. Right hemicolectomy with resection of terminal ileum.  2. Incisional herniorrhaphy.   SURGEON:  Dalia Heading, M.D.   ANESTHESIA:  General endotracheal.   INDICATIONS:  The patient is a 77 year old black female who presented to  [facility] with severe anemia.  A colonoscopy was performed  which revealed a cecal mass which was biopsy-positive for  adenocarcinoma.  The patient has also had previous hysterectomy and has  an incisional hernia.  The risks and benefits of both procedures  including bleeding, infection, cardiopulmonary difficulties, the  possible recurrence of the hernia were fully explained to the patient,  who gave informed consent.   PROCEDURE NOTE:  The patient was placed in supine position.  After  induction of general endotracheal anesthesia, the abdomen was prepped  and draped in the usual sterile technique with Betadine.  Surgical site  confirmation was performed.   A midline incision was made from above the umbilicus to below the  umbilicus.  The incisional hernia was entered into in order to  facilitate reduction of the abdominal contents as well as resection of  the hernia sac.  This was performed down to the fascia.  Multiple layers  of omentum and small bowel were noted adhesed to the anterior abdominal  wall as well as into the pelvis.  She had a lot of adhesions to an  atretic right ovary.  These were freed away using sharp dissection.  The  right ureter was  identified and kept from the operative field.  The  right colon was mobilized along its peritoneal reflection.  The  dissection was taken around to the mid transverse colon.  A GIA stapler  was initially placed across the terminal ileum and fired.  This was  likewise done across the proximal transverse colon.  The mesentery was  then divided using the LigaSure.  The specimen was removed from the  operative field.  The specimen was opened and the neoplasm was noted be  within the specimen removed.  In order to facilitate a tension-free  ileocolic anastomosis, more transverse colon was resected as well as  more terminal ileum.  A side-to-side ileocolic anastomosis was then  performed using a GIA 70 stapler.  The enterotomy was closed using a TA-  60 stapler.  The staple line was bolstered using 3-0 silk Lembert  sutures.  Omentum was then placed over the anastomosis and secured in  place using a silk suture.  The mesenteric defect was closed using a 2-0  chromic gut running suture.  The bowel was returned into the abdominal  cavity in orderly fashion.  The abdominal cavity was then copiously  irrigated with gentamicin and normal saline.  Liver was inspected and  noted to be within normal limits.  The gallbladder, stomach, spleen,  proximal small bowel, descending colon, sigmoid colon, and rectum all  within normal limits.  Uterus was absent.  All operating room personnel  then changed their gloves.   The fascia was then reapproximated in order to close the incisional  hernia using a looped O Novofil running suture.  Subcutaneous layer was  irrigated with normal saline and the skin was closed using staples.  Betadine ointment and dry sterile dressings were applied.   All tape and needle counts correct at the end of the procedure.  The  patient was extubated in the operating room went back to recovery room  awake in stable condition.   COMPLICATIONS:  None.   SPECIMEN:  Terminal ileum  and ascending colon, more terminal ileum, more  transverse colon.   ESTIMATED BLOOD LOSS:  150 mL.      Dalia Heading, M.D.  Electronically Signed     MAJ/MEDQ  D:  07/09/2007  T:  07/10/2007  Job:  [id number]   cc:   Tesfaye D. Felecia Shelling, MD  Fax: [phone]   Kassie Mends, M.D.  [address]  [place] , [place] [address]

## 2010-12-31 NOTE — Discharge Summary (Signed)
Destiny Boyd, ICENHOUR              ACCOUNT NO.:  [id number]   MEDICAL RECORD NO.:  [id number]          PATIENT TYPE:  INP   LOCATION:  A313                          FACILITY:  [facility]   PHYSICIAN:  Dalia Heading, M.D.  DATE OF BIRTH:  1934-03-30   DATE OF ADMISSION:  07/05/2007  DATE OF DISCHARGE:  11/25/2008LH                               DISCHARGE SUMMARY   HOSPITAL COURSE SUMMARY:  The patient is a 76 year old black female who  presented to the emergency room with generalized weakness.  She was  noted be severely anemic.  She was admitted to the hospital for further  evaluation and treatment.  She did receive multiple transfusions of  blood.  Gastroenterology was consulted, and colonoscopy was performed  which revealed a cecal mass.  This was biopsy-positive for  adenocarcinoma.  A surgery consultation was obtained and the patient  subsequently was taken to the operating room on July 09, 2007, and  underwent a right hemicolectomy.  She tolerated the surgery well.  Her  preoperative CEA level was elevated at 11, though her liver examination  intraoperatively was unremarkable.  Final pathology revealed a T2N0M0  adenocarcinoma of the colon.  The patient did receive a transfusion of  blood postoperatively.  Her diet was advanced without difficulty once  her bowel function returned.   The patient is being discharged home on postoperative day #4 in good and  improving condition.   DISCHARGE INSTRUCTIONS:  The patient is to follow up with Dr. Franky Macho on July 20, 2007.   DISCHARGE MEDICATIONS:  Discharge medications include:  1. Vicodin, 1-2 tablets by mouth every 4 hours p.r.n. pain.  2. Glucophage, 2 tablets by mouth twice a day.  3. Glucotrol XL 10 mg by mouth daily.  4. Diovan/hydrochlorothiazide 160/25 mg by mouth daily.  5. Lipitor 10 mg by mouth daily.  6. Atenolol 100 mg by mouth daily.  7. Actos 45 mg by mouth daily.  8. She is to hold her aspirin and Plavix  for now.   PRINCIPLE DIAGNOSES:  1. Colon carcinoma.  2. Anemia.  3. Hypertension.  4. Non-insulin dependent diabetes mellitus.  5. Status post cerebrovascular accident.   PRINCIPLE PROCEDURES:  1. Colonoscopy by Dr. Cira Servant.  2. Right hemicolectomy by Dr. Franky Macho on July 09, 2007.      Dalia Heading, M.D.  Electronically Signed     MAJ/MEDQ  D:  07/13/2007  T:  07/13/2007  Job:  [id number]   cc:   Dalia Heading, M.D.  Fax: [phone]   Tesfaye D. Felecia Shelling, MD  Fax: [phone]   Kassie Mends, M.D.  [address]  [place] , [place] [address]

## 2010-12-31 NOTE — H&P (Signed)
NAME:  Destiny Boyd, Destiny Boyd              ACCOUNT NO.:  [id number]   MEDICAL RECORD NO.:  [id number]          PATIENT TYPE:  AMB   LOCATION:  DAY                           FACILITY:  [facility]   PHYSICIAN:  Kassie Mends, M.D.      DATE OF BIRTH:  04-Jun-1934   DATE OF ADMISSION:  DATE OF DISCHARGE:  LH                              HISTORY & PHYSICAL   REASON FOR VISIT:  Time for colonoscopy, history of colon cancer.   HISTORY OF PRESENT ILLNESS:  The patient is a very pleasant 77 year old  African American female with a history of right hemicolectomy with  resection of the terminal ileum on July 09, 2007, because of cecal  adenocarcinoma detected at time of colonoscopy by Dr. Cira Servant on July 06, 2007.  Colonoscopy was done for iron deficiency anemia.  She had  tumor invading, but not through the muscularis mucosa, though with  lymphovascular space invasion present, the mucosa margins were negative  for tumor.  She had a total of 21 lymph nodes, which were negative.  She  has been doing well.  She states that she occasionally goes to the  bathroom and was seen blood in the toilet.  Most of the time when she  has a bowel movement.  Sometimes is just when she urinates.  She notes  that especially if she does some heavy lifting or stands for long time.  Generally, she has couple of stools a day.  Denies any abdominal pain.  Her appetite has been good.  She has gained about 9 pounds since May  2009.  She denies any nausea, vomiting, or heartburn.   CURRENT MEDICATIONS:  1. Metformin 1 g b.i.d.  2. Glipizide 10 mg daily.  3. Actos 45 mg daily.  4. Aspirin 81 mg daily.  5. Diovan 160 mg daily.  6. Lasix 40 mg daily.  7. Simvastatin 20 mg daily.  8. Atenolol 100 mg daily.   ALLERGIES:  No known drug allergies.   PAST MEDICAL HISTORY:  Colon cancer as above, hypertension, diabetes  mellitus, hysterectomy, and status post right hemicolectomy as above.   FAMILY HISTORY:  Negative for  colorectal cancer or colon polyps, brother  died with cancer of unknown source.   SOCIAL HISTORY:  She is widowed.  She has five children.  One of her  children lives in [place].  She does not drink any alcohol.  She quit  smoking 5-10 years ago.   REVIEW OF SYSTEMS:  See HPI for GI.  CONSTITUTIONAL:  No weight loss.  CARDIOPULMONARY:  No chest pain, shortness of breath, palpitations or  cough.  GENITOURINARY:  No dysuria, questionable hematuria.   PHYSICAL EXAMINATION:  VITAL SIGNS:  Weight 179, height 5 feet 3-1/2  inches, temp 97.8, blood pressure 138/82, and pulse 68.  GENERAL:  Pleasant, well-nourished and well-developed black female, in  no acute distress.  SKIN:  Warm and dry.  No jaundice.  HEENT:  Sclerae nonicteric.  Oropharyngeal mucosa moist and pink.  No  lesions, erythema, or exudate.  No lymphadenopathy or thyromegaly.  CHEST:  Lungs are clear  to auscultation.  CARDIAC:  Regular rate and rhythm.  Normal S1 and S2.  No murmurs, rubs,  or gallops.  ABDOMEN:  Positive bowel sounds.  Soft, nontender, and nondistended.  No  organomegaly or masses.  No rebound or guarding.  No abdominal bruits or  hernias.  EXTREMITIES:  Lower extremities with no edema.   IMPRESSION:  Destiny Boyd is a very pleasant 77 year old lady with a  history of colon carcinoma as outlined above.  She is due for 1-year  followup surveillance colonoscopy.  The last hemoglobin we had in the  chart from April 2009, was 9.3.  Dr. Mariel Sleet has been following her  CEA levels on May 23, 2008, was 9.3 up from 7.6 in March 2009.  The  patient describes having blood in the toilet possibly from rectal  bleeding versus urinary or vaginal source.  She states it only happens  intermittently and not very often.  If nothing is seen at time of  colonoscopy, we would recommend her followup with her primary care  physician for evaluation.   PLAN:  1. Colonoscopy in near future with Dr. Cira Servant for  surveillance      purposes.  2. We will get a CBC on the patient.  Further recommendations to      follow.      Tana Coast, P.A.      Kassie Mends, M.D.  Electronically Signed    LL/MEDQ  D:  07/03/2008  T:  07/04/2008  Job:  [id number]   cc:   Tesfaye D. Felecia Shelling, MD  Fax: [phone]

## 2010-12-31 NOTE — H&P (Signed)
Destiny Boyd, Destiny Boyd              ACCOUNT NO.:  [id number]   MEDICAL RECORD NO.:  [id number]          PATIENT TYPE:  AMB   LOCATION:  DAY                           FACILITY:  [facility]   PHYSICIAN:  Dennie Maizes, M.D.   DATE OF BIRTH:  May 11, 1934   DATE OF ADMISSION:  12/25/2008  DATE OF DISCHARGE:  LH                              HISTORY & PHYSICAL   CHIEF COMPLAINT:  Intermittent pain with gross hematuria, bladder tumor.   HISTORY OF PRESENT ILLNESS:  This 73-year female was referred to me by  Dr. Cira Servant for evaluation of intermittent gross painless hematuria.  The  patient has had intermittent gross hematuria for the past 1 year.  She  had bleeding once or twice a week and she had periods during which she  did not have any bleeding at all.  She has not seen any blood clots in  the urine.  She denied having any upper abdominal pain or flank pain.  There is no history of voiding difficulty.  She has urinary frequency x3  to 4 and nocturia times 1.  There is no history of urinary incontinence,  urinary tract infections or urolithiasis.   PAST MEDICAL HISTORY:  1. Non insulin-dependent diabetes mellitus.  2. Chronic anemia due to rectal bleeding.  3. Macrocytic anemia.  4. Hypertension.  5. Elevated cholesterol.  6. Status post right hemicolectomy for colon cancer.  7. Status post resection of some terminal ileum.  8. Status post hysterectomy.   MEDICATIONS:  1. Metformin hydrochloride 500 mg 2 p.o. daily.  2. Glipizide 10 mg 1 p.o. daily.  3. Actos 45 mg 1 p.o. daily.  4. Ferrous sulfate 325 mg 1 p.o. daily.  5. Diovan 160 mg 1 p.o. daily.  6. Lasix 40 mg 1 p.o. daily.  7. Simvastatin 20 mg 1 p.o. daily.  8. Atenolol 100 mg 1 p.o. daily   ALLERGIES:  None.   FAMILY HISTORY:  Unremarkable.   REVIEW OF SYSTEMS:  Positive for hypertension.   EXAMINATION:  Height 5 feet 3, weight 160 pounds.  HEAD, EYES, EARS, NOSE AND THROAT:  Normal.  LUNGS:  Clear to auscultation.  HEART:  Regular rate and rhythm.  No murmurs.  ABDOMEN:  Soft.  No palpable flank mass or CVA tenderness.  Bladder is  not palpable.  PELVIC:  No evidence of any cystocele   Renal function was abnormal with BUN 34, creatinine 1.171.  Urine  cytology revealed malignant cells suggestive of transitional carcinoma.  CT scan of abdomen and pelvis without contrast revealed right renal  cysts.  There was a soft tissue mass in the right lateral wall of the  bladder measuring 2.4 cm in size.  Cystoscopy in the office confirmed  the presence of a papillary bladder tumor on the right lateral wall  measuring about 2.4 cm in size.   IMPRESSION:  Gross hematuria, bladder tumor.   PLAN:  I discussed with the patient regarding the management options.  She is scheduled to undergo cystoscopy, multiple bladder biopsies and  transurethral resection of bladder tumor in the short-stay center.  I  have informed the patient regarding the diagnosis, operative details,  ultimate treatments, the outcome, possible risks and complications, and  she has agreed for the procedure to be done.  Mitomycin-C bladder  irrigation will be done in the postoperative period.      Dennie Maizes, M.D.  Electronically Signed     SK/MEDQ  D:  12/24/2008  T:  12/24/2008  Job:  [id number]   cc:   Kassie Mends, M.D.  [address]  [place] , [place] [address]

## 2010-12-31 NOTE — Op Note (Signed)
[name], [name]              ACCOUNT NO.:  [id number]   MEDICAL RECORD NO.:  [id number]          PATIENT TYPE:  AMB   LOCATION:  DAY                           FACILITY:  [facility]   PHYSICIAN:  Kassie Mends, M.D.      DATE OF BIRTH:  08-02-1934   DATE OF PROCEDURE:  DATE OF DISCHARGE:                               OPERATIVE REPORT   PROCEDURE:  Capsule endoscopy.   INDICATION FOR EXAM:  Destiny Boyd is a 77 year old female with  intermittent rectal bleeding and microcytic anemia.  She had an upper  endoscopy and colonoscopy, which did not reveal etiology for her  microcytic anemia and rectal bleeding.  The capsule endoscopy is being  performed to evaluate obscure GI bleed.   PATIENT'S DATA:  Height 63 inches, weight 173 pounds, waist 36 inches,  felt protuberant, gastric passage time 15 minutes, small bowel passage  time 2 hours 22 minutes.   FINDINGS:  Limited visualization of gastric mucosa due to retained  contents.  Adequate visualization of the small bowel mucosa was  obtained.  No ulcers, AVMs, or masses were seen.  Limited visualization  of the colon due to retained contents.  The ileocolic anastomosis was  identified.   RECOMMENDATIONS:  1. The patient should continue iron twice daily for 3 months.  Will      check her hemoglobin, hematocrit, and ferritin in 3 months.  2. Follow up appointment in 3 months with Dr. Kassie Mends.  I      discussed these findings with Ms. Elliott.  She should hold her      aspirin for 3 months.      Kassie Mends, M.D.  Electronically Signed     SM/MEDQ  D:  08/03/2008  T:  08/04/2008  Job:  [id number]   cc:   Tesfaye D. Felecia Shelling, MD  Fax: [phone]

## 2010-12-31 NOTE — Consult Note (Signed)
NAMEADRIENE, KNIPFER              ACCOUNT NO.:  [id number]   MEDICAL RECORD NO.:  [id number]          PATIENT TYPE:  INP   LOCATION:  A301                          FACILITY:  [facility]   PHYSICIAN:  Kassie Mends, M.D.      DATE OF BIRTH:  07/05/1934   DATE OF CONSULTATION:  07/05/2007  DATE OF DISCHARGE:                                 CONSULTATION   REFERRING PHYSICIAN:  Tesfaye D. Felecia Shelling, MD.   REASON FOR CONSULTATION:  Anemia.   HISTORY OF PRESENT ILLNESS:  Ms. Altemose is a 77 year old female who  has never had a colonoscopy.  She uses baby aspirin on a daily basis for  greater than the last year.  She came in because she did not really feel  well.  In the emergency department she was found to have a hemoglobin of  4.7.  She denies any vomiting, diarrhea, black tarry stools or blood  from her rectum.  She has one daily bowel movement.  She denies any  heartburn, indigestion or difficulty swallowing.  She occasionally has  abdominal pain.  She has had a little weight loss.  Her appetite has  been good.   PAST MEDICAL HISTORY:  1. Hypertension.  2. Diabetes.   PAST SURGICAL HISTORY:  Hysterectomy.   ALLERGIES:  No known drug allergies.   MEDICATIONS:  1. Tenormin.  2. Glucotrol.  3. Hydrochlorothiazide.  4. NovoLog sliding scale.  5. Avapro.  6. Glucophage.  7. Protonix 40 mg daily.  8. Actos.  9. Zocor.   FAMILY HISTORY:  She has no family history of colon cancer or colon  polyps.   SOCIAL HISTORY:  She does not drink any alcohol.  She quit smoking 5-10  years ago.  She is widowed and has five children.  [place] in  [place].   REVIEW OF SYSTEMS:  As per the HPI, otherwise all systems are negative.   PHYSICAL EXAMINATION:  VITAL SIGNS:  T-max 98.3, blood pressure 84/47,  O2 saturation 97% on room air.  GENERAL APPEARANCE:  She is in no acute distress, alert and oriented x4.  HEENT:  Atraumatic and normocephalic.  Pupils are equal and reactive  to  light.  Mouth with no oral lesions.  Posterior pharynx without erythema  or exudate.  NECK:  Full range of motion, no lymphadenopathy.  LUNGS:  Clear to auscultation bilaterally.  CARDIOVASCULAR:  Regular rhythm, no murmur, normal S1 and S2.  ABDOMEN:  Bowel sounds present.  Soft, nontender, nondistended, no  rebound or guarding, no hepatosplenomegaly, obese.  EXTREMITIES:  No cyanosis or edema.  NEUROLOGIC:  She has no focal neurological deficits.   LABORATORY DATA:  Hemoglobin 4.7, platelets 81.  Albumin 3.  Creatinine  1.19.   RADIOLOGY:  Chest x-ray shows cardiomegaly.   Echocardiogram  shows an ejection fraction 60%.   ASSESSMENT:  Ms. Getchell is a 77 year old female with a profound  microcytic anemia and hypoalbuminemia.  The differential diagnosis  includes colorectal cancer or colorectal polyp.  She has little  likelihood of peptic ulcer disease or arteriovenous malformation.   Thank you  for allowing me to see Ms. Bruso in consultation.  My  recommendations follow.   RECOMMENDATIONS:  1. Clear liquids now.  2. Begin bowel prep now.  3. Colonoscopy plus/minus EGD on July 06, 2007, if her systolic      blood pressure is greater than 90-95.  4. Hold Glucotrol on July 06, 2007.      Kassie Mends, M.D.  Electronically Signed     SM/MEDQ  D:  07/06/2007  T:  07/06/2007  Job:  [id number]

## 2010-12-31 NOTE — H&P (Signed)
NAMELEALER, MARSLAND              ACCOUNT NO.:  [id number]   MEDICAL RECORD NO.:  [id number]          PATIENT TYPE:  AMB   LOCATION:  DAY                           FACILITY:  [facility]   PHYSICIAN:  Dennie Maizes, M.D.   DATE OF BIRTH:  1934/07/12   DATE OF ADMISSION:  DATE OF DISCHARGE:  LH                              HISTORY & PHYSICAL   CHIEF COMPLAINT:  Hematuria, bladder tumor, carcinoma bladder.   HISTORY OF PRESENT ILLNESS:  This 77 year old female was referred to me  by Dr. Cira Servant for evaluation of intermittent gross painless hematuria.  She underwent evaluation in May 2010.  Cystoscopy revealed a large  papillary tumor in the right lateral wall of the bladder.  The patient  was taken to the operating room on Dec 25, 2008.  Cystoscopy, multiple  bladder biopsies and transurethral  resection of bladder tumor were done  at that time.  The patient had spinal anesthesia  for the resection. She  had obturator nerve stimulation and spasms during the procedure.The  tumor base  could only be fulgurated.  Pathology revealed no evidence of  malignancy  in the bladder biopsies.  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  Muscle was not  present in the specimen.  The patient is doing well at present.  She  denied having voiding difficulty, hematuria or abdominal pain.  She has  urinary frequency x3, nocturia times one.  There is no history of  urinary incontinence, urinary tract infections or urolithiasis.   She is brought to the short-stay center today for cystoscopy, multiple  bladder biopsy and three TURBT.   PAST MEDICAL HISTORY:  1. Non-insulin-dependent diabetes mellitus.  2. Chronic anemia due to rectal bleeding.  3. Microcytic anemia.  4. Hypertension.  5. Elevated cholesterol.  6. Status post right hemicolectomy for colon cancer.  7. Status post resection of the terminal ileum.  8. Status post hysterectomy.   MEDICATIONS:  1.  Metformin.  2. Hydrochloride 5 mg two p.o. daily.  3. Glipizide 10 mg p.o. daily.  4. Actos 45 mg p.o. daily.  5. Ferrous sulfate 325 mg p.o. daily.  6. Diovan 160 one p.o. daily.  7. Lasix 40 mg p.o. daily.  8. Simvastatin 20 mg one p.o. daily.  9. Atenolol 100 mg one p.o. daily.   ALLERGIES:  None.   FAMILY HISTORY:  Unremarkable.   PHYSICAL EXAMINATION:  VITAL SIGNS:  Height 5 feet, weight 150 pounds.  HEAD, EYES, EARS, NOSE AND THROAT:  Normal.  NECK:  No masses.  LUNGS:  Clear to auscultation.  HEART:  Regular rate and rhythm.  No murmurs.  ABDOMEN:  Soft.  No palpable flank mass or costovertebral angle  tenderness.  Bladder is not palpable.   IMPRESSION:  Carcinoma of the bladder, high-grade, history of hematuria.   PLAN:  Cystoscopy, multiple bladder biopsies and repeat transurethral  resection of bladder tumor under general anesthesia with multiple  relaxation.  I explained to the patient regarding the diagnosis,  operative details of the treatment outcome, possible risks and  complications and she  has agreed for the procedure to be done.  She will  receive mitomycin C, bladder irrigation of the postoperative care unit.      Dennie Maizes, M.D.  Electronically Signed     SK/MEDQ  D:  02/11/2009  T:  02/12/2009  Job:  [id number]   cc:   Tesfaye D. Felecia Shelling, MD  Fax: [phone]   Kassie Mends, M.D.  [address]  [place] , [place] [address]   [facility]

## 2011-01-14 ENCOUNTER — Ambulatory Visit (HOSPITAL_COMMUNITY)
Admission: RE | Admit: 2011-01-14 | Discharge: 2011-01-14 | Disposition: A | Discharge status: Home / Self Care | Payer: Medicare Other | Source: Ambulatory Visit | Attending: Oncology | Admitting: Oncology

## 2011-01-14 DIAGNOSIS — C801 Malignant (primary) neoplasm, unspecified: Secondary | ICD-10-CM

## 2011-01-14 DIAGNOSIS — R634 Abnormal weight loss: Secondary | ICD-10-CM | POA: Insufficient documentation

## 2011-01-14 DIAGNOSIS — K8020 Calculus of gallbladder without cholecystitis without obstruction: Secondary | ICD-10-CM | POA: Insufficient documentation

## 2011-01-14 DIAGNOSIS — C189 Malignant neoplasm of colon, unspecified: Secondary | ICD-10-CM | POA: Insufficient documentation

## 2011-01-14 DIAGNOSIS — Q619 Cystic kidney disease, unspecified: Secondary | ICD-10-CM | POA: Insufficient documentation

## 2011-03-20 ENCOUNTER — Encounter (HOSPITAL_COMMUNITY): Payer: Medicare Other | Attending: Oncology

## 2011-03-20 DIAGNOSIS — C189 Malignant neoplasm of colon, unspecified: Secondary | ICD-10-CM

## 2011-03-20 DIAGNOSIS — D509 Iron deficiency anemia, unspecified: Secondary | ICD-10-CM | POA: Insufficient documentation

## 2011-03-20 LAB — DIFFERENTIAL
Eosinophils Relative: 8 % — ABNORMAL HIGH (ref 0–5)
Lymphocytes Relative: 39 % (ref 12–46)
Lymphs Abs: 1.7 10*3/uL (ref 0.7–4.0)
Monocytes Relative: 8 % (ref 3–12)

## 2011-03-20 LAB — CBC
HCT: 32.2 % — ABNORMAL LOW (ref 36.0–46.0)
Hemoglobin: 10.2 g/dL — ABNORMAL LOW (ref 12.0–15.0)
MCH: 25.1 pg — ABNORMAL LOW (ref 26.0–34.0)
MCHC: 31.7 g/dL (ref 30.0–36.0)
MCV: 79.3 fL (ref 78.0–100.0)
RBC: 4.06 MIL/uL (ref 3.87–5.11)

## 2011-03-20 LAB — IRON AND TIBC
Iron: 89 ug/dL (ref 42–135)
UIBC: 307 ug/dL

## 2011-03-20 LAB — FERRITIN: Ferritin: 226 ng/mL (ref 10–291)

## 2011-03-20 LAB — CEA: CEA: 9.4 ng/mL — ABNORMAL HIGH (ref 0.0–5.0)

## 2011-03-20 NOTE — Progress Notes (Signed)
Labs drawn today for cbc/diff,cea,ferr,feibc

## 2011-03-21 ENCOUNTER — Ambulatory Visit (HOSPITAL_COMMUNITY): Payer: Medicare Other

## 2011-04-16 ENCOUNTER — Encounter (HOSPITAL_COMMUNITY): Payer: Self-pay | Admitting: Oncology

## 2011-04-16 ENCOUNTER — Encounter (HOSPITAL_BASED_OUTPATIENT_CLINIC_OR_DEPARTMENT_OTHER): Payer: Medicare Other | Admitting: Oncology

## 2011-04-16 DIAGNOSIS — C189 Malignant neoplasm of colon, unspecified: Secondary | ICD-10-CM

## 2011-04-16 DIAGNOSIS — C180 Malignant neoplasm of cecum: Secondary | ICD-10-CM

## 2011-04-16 DIAGNOSIS — D509 Iron deficiency anemia, unspecified: Secondary | ICD-10-CM

## 2011-04-16 NOTE — Patient Instructions (Signed)
[facility]  Discharge Instructions  RECOMMENDATIONS MADE BY THE CONSULTANT AND ANY TEST RESULTS WILL BE SENT TO YOUR REFERRING DOCTOR.         SPECIAL INSTRUCTIONS/FOLLOW-UP:  Return to clinic in 3 months for lab work and MD appointment.   I acknowledge that I have been informed and understand all the instructions given to me and received a copy. I do not have any more questions at this time, but understand that I may call the Specialty Clinic at [facility] at [phone] during business hours should I have any further questions or need assistance in obtaining follow-up care.    __________________________________________  _____________  __________ Signature of Patient or Authorized Representative            Date                   Time    __________________________________________ Nurse's Signature

## 2011-04-16 NOTE — Progress Notes (Signed)
Avon Gully, MD [address] [place] [address]  1. Adenocarcinoma of cecum  CBC, Differential, Comprehensive metabolic panel, CEA  2. Iron deficiency anemia  Iron and TIBC, Ferritin    CURRENT THERAPY: S/P resection on 07/09/2007.  S/P Feraheme 510 mg infusion on 10/30/09 and 11/06/09  INTERVAL HISTORY: Destiny Boyd 77 y.o. female returns for  regular  visit for followup of Stage I Adenocarcinoma of the cecum.    The patient admits to a swollen right knee which she blames on a gouty attack.  She denies any trauma to the joint.  She denies any pain.    The patient reports that her bowels are moving appropriately.  She denies any pain or discomfort.  She denies any blood in her stool, black tarry stool, and hematuria.  She denies any fevers, chills, night sweats.    She explains to me that she is going on a cruise to [place] at the beginning of September.  She is really looking forward to that.  She wishes for a "good report" from me before she departs.  She explains that her energy level is good and her appetite is great.  I spent some time personally going over the patient's lab work with her.  We also went over a CT scan from May 2012 which does not illustrate any evidence for disease.  Past Medical History  Diagnosis Date  . Colon cancer     cecum cancer  . Iron deficiency anemia   . Diabetes mellitus   . Leukopenia   . Gout   . Hypercholesteremia   . Stroke 2005  . Obesity   . Adenocarcinoma of cecum 10/30/2008    has Adenocarcinoma of cecum; DM; HYPERTENSION; HEMORRHOIDS; HEMATURIA UNSPECIFIED; and TOBACCO USE, QUIT on her problem list.      has no known allergies.  Destiny Boyd does not currently have medications on file.  Past Surgical History  Procedure Date  . Cataract extraction, bilateral     lens implants    Denies any headaches, dizziness, double vision, fevers, chills, night sweats, nausea, vomiting, diarrhea, constipation, chest pain,  heart palpitations, shortness of breath, blood in stool, black tarry stool, urinary pain, urinary burning, urinary frequency, hematuria.   PHYSICAL EXAMINATION  ECOG PERFORMANCE STATUS: 0 - Asymptomatic  Filed Vitals:   04/16/11 0907  BP: 157/75  Pulse: 61  Temp: 98.2 F (36.8 C)    GENERAL:alert, healthy, no distress, well nourished, well developed, comfortable, cooperative and smiling SKIN: skin color, texture, turgor are normal HEAD: Normocephalic EYES: normal EARS: External ears normal OROPHARYNX:mucous membranes are moist  NECK: supple, no bruits, no JVD, thyroid normal size, non-tender, without nodularity, no stridor, non-tender LYMPH:  No epitrochlear nodes or inguinal nodes noted BREAST:not examined LUNGS: clear to auscultation and percussion HEART: regular rate & rhythm, no murmurs, no gallops, S1 normal and S2 normal ABDOMEN:abdomen soft, non-tender, normal bowel sounds, no masses or organomegaly and no hepatosplenomegaly BACK: Back symmetric, no curvature., No CVA tenderness EXTREMITIES:less then 2 second capillary refill, no edema, no skin discoloration, no clubbing, no cyanosis, no tenderness to palpation of the right knee.  No right knee heat or erythema.  No tenderness through its range of motion.   NEURO: alert & oriented x 3 with fluent speech, no focal motor/sensory deficits, gait normal   LABORATORY DATA: CBC    Component Value Date/Time   WBC 4.3 03/20/2011 0902   RBC 4.06 03/20/2011 0902   HGB 10.2* 03/20/2011 0902  HCT 32.2* 03/20/2011 0902   PLT 204 03/20/2011 0902   MCV 79.3 03/20/2011 0902   MCH 25.1* 03/20/2011 0902   MCHC 31.7 03/20/2011 0902   RDW 13.9 03/20/2011 0902   LYMPHSABS 1.7 03/20/2011 0902   MONOABS 0.3 03/20/2011 0902   EOSABS 0.3 03/20/2011 0902   BASOSABS 0.0 03/20/2011 0902      Chemistry      Component Value Date/Time   NA 141 02/09/2009 0933   K 4.0 02/09/2009 0933   CL 111 02/09/2009 0933   CO2 25 02/09/2009 0933   BUN 32* 02/09/2009 0933    CREATININE 3.19* 12/27/2010 1156      Component Value Date/Time   CALCIUM 9.6 02/09/2009 0933     Lab Results  Component Value Date   CEA 9.4* 03/20/2011   RADIOGRAPHIC STUDIES:  01/14/2011  *RADIOLOGY REPORT*  Clinical Data: Unintentional weight loss. Colon carcinoma.  CT ABDOMEN AND PELVIS WITHOUT CONTRAST  Technique: Multidetector CT imaging of the abdomen and pelvis was  performed following the standard protocol without intravenous  contrast.  Comparison: 12/04/2008  Findings: Previously suspected bladder mass along the right  lateral wall is no longer visualized on today's study. There is no  evidence of ureteral calculi or dilatation. No evidence of renal  calculi hydronephrosis. 3 cm fluid attenuation cyst in the upper  pole of the right kidney is stable, as well as a smaller cyst in  the mid pole of the right kidney. No evidence of perinephric fluid  or inflammatory changes.  Cholelithiasis again noted, however there is no evidence of  cholecystitis. Noncontrast images of the liver, spleen, pancreas,  and adrenal glands are normal in appearance. No lymphadenopathy  identified within the abdomen or pelvis. No evidence of bowel wall  thickening or dilatation. Previous hysterectomy noted. A small  periumbilical abdominal wall hernia containing only fat is stable  in appearance. No evidence of herniated bowel.  IMPRESSION:  1. No acute findings within the abdomen or pelvis.  2. Stable right renal fluid attenuation cysts. No evidence of  urolithiasis or hydronephrosis.  3. Cholelithiasis. No evidence of cholecystitis.  4. Stable small periumbilical hernia containing only fat.  Original Report Authenticated By: Danae Orleans, M.D.   PATHOLOGY: 1. Intestine, segmental resection of right colon and terminal ileum- invasive colonic adenocarcinoma, 2.3 cm, moderately differentiated.  Tumor invades into but not through the muscularis mucosa.  LVI identified.  Mucosal margins  negative for tumor.  0/21 nodes for metastatic disease.    ASSESSMENT:  1. Stage I Adenocarcinoma of the cecum, S/P resection on 07/09/2007.   2. Iron deficiency anemia at presentation.  Poor oral absorption presently requiring intermittent IV iron 3. Anemia of chronic disease 4. Right knee "swelling"   PLAN:  1. Recommend an over the counter anti-inflammatory medication such as Advil, aleve, Ibuprofen. 2. Lab work in 3 months: CBC diff, CMET, CEA, Iron and TIBC, Ferritin 3. Return in 3 months. 4. I personally reviewed the patient's lab work with the patient 5. I personally reviewed and went over radiographic studies with the patient.    All questions were answered. The patient knows to call the clinic with any problems, questions or concerns. We can certainly see the patient much sooner if necessary.  The patient and plan discussed with Glenford Peers, MD and he is in agreement with the aforementioned.  More than 50% of the time spent with the patient was utilized for counseling.   KEFALAS,[place]

## 2011-05-12 LAB — CEA: CEA: 7.6 — ABNORMAL HIGH

## 2011-05-23 ENCOUNTER — Encounter: Payer: Self-pay | Admitting: Gastroenterology

## 2011-05-23 LAB — COMPREHENSIVE METABOLIC PANEL
ALT: 11 U/L (ref 0–35)
ALT: 12
AST: 17 U/L (ref 0–37)
AST: 19
Albumin: 3.6 g/dL (ref 3.5–5.2)
Albumin: 3.7
Alkaline Phosphatase: 130 U/L — ABNORMAL HIGH (ref 39–117)
Alkaline Phosphatase: 84
BUN: 26 mg/dL — ABNORMAL HIGH (ref 6–23)
BUN: 15
CO2: 23 mEq/L (ref 19–32)
CO2: 19
Calcium: 9.3 mg/dL (ref 8.4–10.5)
Calcium: 9.5
Chloride: 110 mEq/L (ref 96–112)
Chloride: 113 — ABNORMAL HIGH
Creatinine, Ser: 1.53 mg/dL — ABNORMAL HIGH (ref 0.4–1.2)
Creatinine, Ser: 1.22 — ABNORMAL HIGH
GFR calc Af Amer: 40 mL/min — ABNORMAL LOW (ref >60)
GFR calc Af Amer: 53 — ABNORMAL LOW
GFR calc non Af Amer: 33 mL/min — ABNORMAL LOW (ref >60)
GFR calc non Af Amer: 43 — ABNORMAL LOW
Glucose, Bld: 64 mg/dL — ABNORMAL LOW (ref 70–99)
Glucose, Bld: 94
Potassium: 3.8 mEq/L (ref 3.5–5.1)
Potassium: 3.9
Sodium: 139 mEq/L (ref 135–145)
Sodium: 139
Total Bilirubin: 0.7 mg/dL (ref 0.3–1.2)
Total Bilirubin: 0.7
Total Protein: 7.4 g/dL (ref 6.0–8.3)
Total Protein: 7.8

## 2011-05-23 LAB — CBC
HCT: 31.7 % — ABNORMAL LOW (ref 36.0–46.0)
Hemoglobin: 10.3 g/dL — ABNORMAL LOW (ref 12.0–15.0)
MCHC: 32.5 g/dL (ref 30.0–36.0)
MCV: 74.6 fL — ABNORMAL LOW (ref 78.0–100.0)
Platelets: 197 10*3/uL (ref 150–400)
RBC: 4.25 MIL/uL (ref 3.87–5.11)
RBC: 4.01
RDW: 17.5 % — ABNORMAL HIGH (ref 11.5–15.5)
WBC: 3.7 10*3/uL — ABNORMAL LOW (ref 4.0–10.5)
WBC: 4.9

## 2011-05-23 LAB — CEA
CEA: 8.6 ng/mL — ABNORMAL HIGH (ref 0.0–5.0)
CEA: 7.8 — ABNORMAL HIGH

## 2011-05-23 LAB — FERRITIN: Ferritin: 8 ng/mL — ABNORMAL LOW (ref 10–291)

## 2011-05-23 LAB — GLUCOSE, CAPILLARY: Glucose-Capillary: 109 mg/dL — ABNORMAL HIGH (ref 70–99)

## 2011-05-27 LAB — BASIC METABOLIC PANEL
BUN: 13
BUN: 16
BUN: 20
BUN: 22
CO2: 26
CO2: 29
CO2: 30
Calcium: 8.1 — ABNORMAL LOW
Calcium: 8.1 — ABNORMAL LOW
Calcium: 8.4
Calcium: 8.5
Calcium: 8.9
Calcium: 9.1
Chloride: 106
Chloride: 106
Chloride: 113 — ABNORMAL HIGH
Creatinine, Ser: 1.13
Creatinine, Ser: 1.22 — ABNORMAL HIGH
Creatinine, Ser: 1.44 — ABNORMAL HIGH
Creatinine, Ser: 1.5 — ABNORMAL HIGH
GFR calc Af Amer: 41 — ABNORMAL LOW
GFR calc Af Amer: 43 — ABNORMAL LOW
GFR calc Af Amer: 48 — ABNORMAL LOW
GFR calc Af Amer: 53 — ABNORMAL LOW
GFR calc Af Amer: 57 — ABNORMAL LOW
GFR calc non Af Amer: 34 — ABNORMAL LOW
GFR calc non Af Amer: 36 — ABNORMAL LOW
GFR calc non Af Amer: 40 — ABNORMAL LOW
GFR calc non Af Amer: 47 — ABNORMAL LOW
GFR calc non Af Amer: 47 — ABNORMAL LOW
Glucose, Bld: 138 — ABNORMAL HIGH
Glucose, Bld: 145 — ABNORMAL HIGH
Glucose, Bld: 151 — ABNORMAL HIGH
Glucose, Bld: 192 — ABNORMAL HIGH
Potassium: 3.3 — ABNORMAL LOW
Potassium: 3.5
Potassium: 4.1
Potassium: 4.5
Sodium: 139
Sodium: 141
Sodium: 141
Sodium: 143
Sodium: 143

## 2011-05-27 LAB — DIFFERENTIAL
Basophils Absolute: 0
Basophils Absolute: 0
Basophils Absolute: 0
Basophils Absolute: 0
Basophils Relative: 0
Basophils Relative: 0
Basophils Relative: 0
Basophils Relative: 0
Basophils Relative: 0
Basophils Relative: 0
Basophils Relative: 0
Eosinophils Absolute: 0 — ABNORMAL LOW
Eosinophils Absolute: 0.2
Eosinophils Absolute: 0.2
Eosinophils Absolute: 0.3
Eosinophils Absolute: 0.3
Eosinophils Absolute: 0.3
Eosinophils Relative: 0
Eosinophils Relative: 3
Eosinophils Relative: 3
Eosinophils Relative: 3
Eosinophils Relative: 4
Lymphocytes Relative: 11 — ABNORMAL LOW
Lymphocytes Relative: 14
Lymphocytes Relative: 14
Lymphocytes Relative: 15
Lymphocytes Relative: 17
Lymphocytes Relative: 17
Lymphocytes Relative: 6 — ABNORMAL LOW
Lymphocytes Relative: 9 — ABNORMAL LOW
Lymphs Abs: 0.9
Lymphs Abs: 1.2
Lymphs Abs: 1.3
Lymphs Abs: 1.3
Lymphs Abs: 1.4
Lymphs Abs: 1.5
Lymphs Abs: 1.8
Monocytes Absolute: 0.6
Monocytes Absolute: 0.8
Monocytes Absolute: 0.8
Monocytes Absolute: 0.8
Monocytes Absolute: 0.8
Monocytes Absolute: 0.9
Monocytes Absolute: 0.9
Monocytes Relative: 10
Monocytes Relative: 11
Monocytes Relative: 13 — ABNORMAL HIGH
Monocytes Relative: 5
Monocytes Relative: 7
Monocytes Relative: 9
Monocytes Relative: 9
Monocytes Relative: 9
Neutro Abs: 11.3 — ABNORMAL HIGH
Neutro Abs: 3.2
Neutro Abs: 5.7
Neutro Abs: 6
Neutro Abs: 6.1
Neutro Abs: 6.4
Neutro Abs: 6.7
Neutro Abs: 7.6
Neutrophils Relative %: 53
Neutrophils Relative %: 54
Neutrophils Relative %: 73
Neutrophils Relative %: 73
Neutrophils Relative %: 76
Neutrophils Relative %: 78 — ABNORMAL HIGH
Neutrophils Relative %: 83 — ABNORMAL HIGH
Neutrophils Relative %: 88 — ABNORMAL HIGH
Smear Review: DECREASED

## 2011-05-27 LAB — CROSSMATCH
ABO/RH(D): B POS
Antibody Screen: NEGATIVE
Antibody Screen: NEGATIVE

## 2011-05-27 LAB — CBC
HCT: 16.5 — ABNORMAL LOW
HCT: 22.5 — ABNORMAL LOW
HCT: 24.3 — ABNORMAL LOW
HCT: 24.8 — ABNORMAL LOW
HCT: 25.1 — ABNORMAL LOW
HCT: 30.4 — ABNORMAL LOW
HCT: 31.2 — ABNORMAL LOW
Hemoglobin: 10.1 — ABNORMAL LOW
Hemoglobin: 4.7 — CL
Hemoglobin: 7.2 — CL
Hemoglobin: 7.8 — CL
Hemoglobin: 7.8 — CL
Hemoglobin: 8 — ABNORMAL LOW
Hemoglobin: 8.8 — ABNORMAL LOW
Hemoglobin: 9.7 — ABNORMAL LOW
MCHC: 28.6 — ABNORMAL LOW
MCHC: 31.5
MCHC: 31.7
MCHC: 32
MCHC: 32
MCHC: 32.1
MCHC: 32.1
MCHC: 32.2
MCV: 56.4 — ABNORMAL LOW
MCV: 66.9 — ABNORMAL LOW
MCV: 67.7 — ABNORMAL LOW
MCV: 71.9 — ABNORMAL LOW
MCV: 74.3 — ABNORMAL LOW
MCV: 79
MCV: 79.9
Platelets: 104 — ABNORMAL LOW
Platelets: 157
Platelets: 168
Platelets: 266
Platelets: 273
Platelets: 66 — ABNORMAL LOW
Platelets: 68 — ABNORMAL LOW
Platelets: 81 — ABNORMAL LOW
Platelets: 88 — ABNORMAL LOW
RBC: 2.93 — ABNORMAL LOW
RBC: 3.03 — ABNORMAL LOW
RBC: 3.37 — ABNORMAL LOW
RBC: 3.47 — ABNORMAL LOW
RBC: 3.51 — ABNORMAL LOW
RBC: 3.67 — ABNORMAL LOW
RBC: 3.83 — ABNORMAL LOW
RBC: 3.85 — ABNORMAL LOW
RBC: 3.85 — ABNORMAL LOW
RBC: 3.9
RDW: 25.1 — ABNORMAL HIGH
RDW: 26 — ABNORMAL HIGH
RDW: 26.2 — ABNORMAL HIGH
RDW: 26.6 — ABNORMAL HIGH
RDW: 29.8 — ABNORMAL HIGH
RDW: 33.8 — ABNORMAL HIGH
RDW: 34.5 — ABNORMAL HIGH
WBC: 13.6 — ABNORMAL HIGH
WBC: 15.8 — ABNORMAL HIGH
WBC: 4.6
WBC: 5.9
WBC: 8
WBC: 8.2
WBC: 8.7
WBC: 8.9
WBC: 9.1
WBC: 9.7

## 2011-05-27 LAB — MAGNESIUM
Magnesium: 1.3 — ABNORMAL LOW
Magnesium: 1.4 — ABNORMAL LOW

## 2011-05-27 LAB — CEA: CEA: 11.7 — ABNORMAL HIGH

## 2011-05-27 LAB — URINALYSIS, ROUTINE W REFLEX MICROSCOPIC
Bilirubin Urine: NEGATIVE
Glucose, UA: NEGATIVE
Ketones, ur: NEGATIVE
Leukocytes, UA: NEGATIVE
Nitrite: NEGATIVE
Protein, ur: NEGATIVE
Specific Gravity, Urine: 1.015
Urobilinogen, UA: 0.2
pH: 5.5

## 2011-05-27 LAB — PREPARE RBC (CROSSMATCH)

## 2011-05-27 LAB — COMPREHENSIVE METABOLIC PANEL
ALT: 19
AST: 24
Alkaline Phosphatase: 81
GFR calc Af Amer: 54 — ABNORMAL LOW
Glucose, Bld: 137 — ABNORMAL HIGH
Potassium: 3.8
Sodium: 140
Total Protein: 6.2

## 2011-05-27 LAB — IRON AND TIBC: Iron: 25 — ABNORMAL LOW

## 2011-05-27 LAB — FOLATE RBC: RBC Folate: 1209 — ABNORMAL HIGH

## 2011-05-27 LAB — PHOSPHORUS
Phosphorus: 2.6
Phosphorus: 2.9
Phosphorus: 3.3

## 2011-05-27 LAB — URINE MICROSCOPIC-ADD ON

## 2011-05-27 LAB — FERRITIN: Ferritin: 6 — ABNORMAL LOW (ref 10–291)

## 2011-05-27 LAB — PREPARE PLATELET PHERESIS

## 2011-05-27 LAB — PROTIME-INR
INR: 1.1
Prothrombin Time: 14.3

## 2011-05-27 LAB — ALBUMIN: Albumin: 2.5 — ABNORMAL LOW

## 2011-07-17 ENCOUNTER — Other Ambulatory Visit (HOSPITAL_COMMUNITY): Payer: Medicare Other

## 2011-07-21 ENCOUNTER — Other Ambulatory Visit (HOSPITAL_COMMUNITY): Payer: Self-pay | Admitting: Oncology

## 2011-07-21 ENCOUNTER — Encounter (HOSPITAL_COMMUNITY): Payer: Medicare Other | Attending: Oncology | Admitting: Oncology

## 2011-07-21 DIAGNOSIS — Z09 Encounter for follow-up examination after completed treatment for conditions other than malignant neoplasm: Secondary | ICD-10-CM | POA: Insufficient documentation

## 2011-07-21 DIAGNOSIS — C180 Malignant neoplasm of cecum: Secondary | ICD-10-CM

## 2011-07-21 DIAGNOSIS — D509 Iron deficiency anemia, unspecified: Secondary | ICD-10-CM | POA: Insufficient documentation

## 2011-07-21 DIAGNOSIS — E876 Hypokalemia: Secondary | ICD-10-CM

## 2011-07-21 DIAGNOSIS — C189 Malignant neoplasm of colon, unspecified: Secondary | ICD-10-CM

## 2011-07-21 DIAGNOSIS — Z85038 Personal history of other malignant neoplasm of large intestine: Secondary | ICD-10-CM | POA: Insufficient documentation

## 2011-07-21 LAB — DIFFERENTIAL
Basophils Relative: 1 % (ref 0–1)
Eosinophils Absolute: 0.4 10*3/uL (ref 0.0–0.7)
Eosinophils Relative: 9 % — ABNORMAL HIGH (ref 0–5)
Monocytes Absolute: 0.4 10*3/uL (ref 0.1–1.0)
Monocytes Relative: 8 % (ref 3–12)

## 2011-07-21 LAB — COMPREHENSIVE METABOLIC PANEL
Albumin: 3.8 g/dL (ref 3.5–5.2)
BUN: 60 mg/dL — ABNORMAL HIGH (ref 6–23)
Creatinine, Ser: 3.73 mg/dL — ABNORMAL HIGH (ref 0.50–1.10)
GFR calc Af Amer: 13 mL/min — ABNORMAL LOW (ref >90)
Glucose, Bld: 145 mg/dL — ABNORMAL HIGH (ref 70–99)
Total Protein: 7.7 g/dL (ref 6.0–8.3)

## 2011-07-21 LAB — CBC
HCT: 32.1 % — ABNORMAL LOW (ref 36.0–46.0)
Hemoglobin: 10.3 g/dL — ABNORMAL LOW (ref 12.0–15.0)
MCH: 25.6 pg — ABNORMAL LOW (ref 26.0–34.0)
MCHC: 32.1 g/dL (ref 30.0–36.0)
MCV: 79.9 fL (ref 78.0–100.0)

## 2011-07-21 LAB — CEA: CEA: 14.3 ng/mL — ABNORMAL HIGH (ref 0.0–5.0)

## 2011-07-21 LAB — IRON AND TIBC
Iron: 46 ug/dL (ref 42–135)
Saturation Ratios: 11 % — ABNORMAL LOW (ref 20–55)
UIBC: 368 ug/dL (ref 125–400)

## 2011-07-21 NOTE — Patient Instructions (Signed)
Destiny Boyd  [id number] 19-Apr-1934  [facility]  Discharge Instructions  RECOMMENDATIONS MADE BY THE CONSULTANT AND ANY TEST RESULTS WILL BE SENT TO YOUR REFERRING DOCTOR.   EXAM FINDINGS BY MD TODAY AND SIGNS AND SYMPTOMS TO REPORT TO CLINIC OR PRIMARY MD: you are doing well.  If your labs are ok we will check labs and see you in 4 months.  If not ok, we will recheck your labs in 2 months, we will call you.  MEDICATIONS PRESCRIBED: none   INSTRUCTIONS GIVEN AND DISCUSSED: Other :  Report shortness of breath, changes in bowel habits, blood in stool, etc.  SPECIAL INSTRUCTIONS/FOLLOW-UP: Return to Clinic in 4 months for labs and follow-up   I acknowledge that I have been informed and understand all the instructions given to me and received a copy. I do not have any more questions at this time, but understand that I may call the Specialty Clinic at [facility] at [phone] during business hours should I have any further questions or need assistance in obtaining follow-up care.    __________________________________________  _____________  __________ Signature of Patient or Authorized Representative            Date                   Time    __________________________________________ Nurse's Signature

## 2011-07-21 NOTE — Progress Notes (Signed)
Avon Gully, MD [address] [place] [address]  1. Adenocarcinoma of cecum  CBC, Differential, Comprehensive metabolic panel, CEA, CBC, Differential, Ferritin, Basic metabolic panel, CEA  2. Iron deficiency anemia  Iron and TIBC, Ferritin, CBC, Ferritin    CURRENT THERAPY:S/P resection on 07/09/2007. S/P Feraheme 510 mg infusion on 10/30/09 and 11/06/09   INTERVAL HISTORY: Destiny Boyd 77 y.o. female returns for  regular  visit for followup of Stage I Adenocarcinoma of the cecum.   The patient recently went on a cruise. She reports a cruise with extremely well. She is excited to go again. She explains that she would recommend a cruise to anyone. Patient also reports that she had a wonderful Thanksgiving holiday.  The patient denies any complaints. She reports that she is moving her bowels well. She denies any blood in her stool or black tarry stool. She denies any pencil thin stools. Her bowels are operating as expected. She does not have any problems with her urine. She denies any fevers, chills, night sweats. She has not had any gout flareups.   Past Medical History  Diagnosis Date  . Colon cancer     cecum cancer  . Iron deficiency anemia   . Diabetes mellitus   . Leukopenia   . Gout   . Hypercholesteremia   . Stroke 2005  . Obesity   . Adenocarcinoma of cecum 10/30/2008    has Adenocarcinoma of cecum; DM; HYPERTENSION; HEMORRHOIDS; HEMATURIA UNSPECIFIED; and TOBACCO USE, QUIT on her problem list.      has no known allergies.  Ms. Llera does not currently have medications on file.  Past Surgical History  Procedure Date  . Cataract extraction, bilateral     lens implants    Denies any headaches, dizziness, double vision, fevers, chills, night sweats, nausea, vomiting, diarrhea, constipation, chest pain, heart palpitations, shortness of breath, blood in stool, black tarry stool, urinary pain, urinary burning, urinary frequency, hematuria.   PHYSICAL  EXAMINATION  ECOG PERFORMANCE STATUS: 0 - Asymptomatic  Filed Vitals:   07/21/11 1023  BP: 161/78  Pulse: 59  Temp: 97.9 F (36.6 C)    GENERAL:alert, healthy, no distress, well nourished, well developed, comfortable, cooperative, obese and smiling SKIN: skin color, texture, turgor are normal HEAD: Normocephalic EYES: normal EARS: External ears normal OROPHARYNX:mucous membranes are moist  NECK: supple, no adenopathy, no bruits, thyroid normal size, non-tender, without nodularity, no stridor, non-tender, trachea midline LYMPH:  no palpable lymphadenopathy, no hepatosplenomegaly BREAST:not examined LUNGS: clear to auscultation and percussion HEART: regular rate & rhythm, no murmurs, no gallops, S1 normal and S2 normal ABDOMEN:abdomen soft, non-tender, obese, normal bowel sounds and no hepatosplenomegaly BACK: Back symmetric, no curvature., No CVA tenderness EXTREMITIES:less then 2 second capillary refill, no joint deformities, effusion, or inflammation, no edema, no skin discoloration, no clubbing, no cyanosis  NEURO: alert & oriented x 3 with fluent speech, no focal motor/sensory deficits, gait normal   LABORATORY DATA: CBC    Component Value Date/Time   WBC 4.4 07/21/2011 1023   RBC 4.02 07/21/2011 1023   HGB 10.3* 07/21/2011 1023   HCT 32.1* 07/21/2011 1023   PLT 213 07/21/2011 1023   MCV 79.9 07/21/2011 1023   MCH 25.6* 07/21/2011 1023   MCHC 32.1 07/21/2011 1023   RDW 14.4 07/21/2011 1023   LYMPHSABS 1.5 07/21/2011 1023   MONOABS 0.4 07/21/2011 1023   EOSABS 0.4 07/21/2011 1023   BASOSABS 0.0 07/21/2011 1023      Chemistry  Component Value Date/Time   NA 140 07/21/2011 1023   K 3.4* 07/21/2011 1023   CL 106 07/21/2011 1023   CO2 22 07/21/2011 1023   BUN 60* 07/21/2011 1023   CREATININE 3.73* 07/21/2011 1023      Component Value Date/Time   CALCIUM 9.4 07/21/2011 1023   ALKPHOS 144* 07/21/2011 1023   AST 13 07/21/2011 1023   ALT 9 07/21/2011 1023   BILITOT 0.4 07/21/2011  1023     Lab Results  Component Value Date   CEA 9.4* 03/20/2011      PENDING LABS: Ferritin, Iron/TIBC, CEA   RADIOGRAPHIC STUDIES:  01/14/2011  *RADIOLOGY REPORT*  Clinical Data: Unintentional weight loss. Colon carcinoma.  CT ABDOMEN AND PELVIS WITHOUT CONTRAST  Technique: Multidetector CT imaging of the abdomen and pelvis was  performed following the standard protocol without intravenous  contrast.  Comparison: 12/04/2008  Findings: Previously suspected bladder mass along the right  lateral wall is no longer visualized on today's study. There is no  evidence of ureteral calculi or dilatation. No evidence of renal  calculi hydronephrosis. 3 cm fluid attenuation cyst in the upper  pole of the right kidney is stable, as well as a smaller cyst in  the mid pole of the right kidney. No evidence of perinephric fluid  or inflammatory changes.  Cholelithiasis again noted, however there is no evidence of  cholecystitis. Noncontrast images of the liver, spleen, pancreas,  and adrenal glands are normal in appearance. No lymphadenopathy  identified within the abdomen or pelvis. No evidence of bowel wall  thickening or dilatation. Previous hysterectomy noted. A small  periumbilical abdominal wall hernia containing only fat is stable  in appearance. No evidence of herniated bowel.  IMPRESSION:  1. No acute findings within the abdomen or pelvis.  2. Stable right renal fluid attenuation cysts. No evidence of  urolithiasis or hydronephrosis.  3. Cholelithiasis. No evidence of cholecystitis.  4. Stable small periumbilical hernia containing only fat.  Original Report Authenticated By: Danae Orleans, M.D.    ASSESSMENT:  1. Stage I adenocarcinoma of cecum, S/P resection on 07/09/2007. 2. Iron deficiency anemia requiring intermittent feraheme infusion, S/P Feraheme 510 mg infusion on 10/30/09 and 11/06/09   PLAN:  1. Pending lab results today, may order lab work in 2 months 2. Lab work  in 4 months: CBC diff, BMET, CEA, Ferritin 3. I personally reviewed and went over laboratory results with the patient. 4. Return in 4 months for follow-up   All questions were answered. The patient knows to call the clinic with any problems, questions or concerns. We can certainly see the patient much sooner if necessary.   Birda Didonato

## 2011-07-22 ENCOUNTER — Other Ambulatory Visit (HOSPITAL_COMMUNITY): Payer: Self-pay

## 2011-07-31 ENCOUNTER — Other Ambulatory Visit (HOSPITAL_COMMUNITY): Payer: Self-pay | Admitting: Oncology

## 2011-07-31 DIAGNOSIS — C189 Malignant neoplasm of colon, unspecified: Secondary | ICD-10-CM

## 2011-08-06 ENCOUNTER — Other Ambulatory Visit (HOSPITAL_COMMUNITY): Payer: Medicare Other

## 2011-08-25 ENCOUNTER — Other Ambulatory Visit (HOSPITAL_COMMUNITY): Payer: Medicare Other

## 2011-08-27 ENCOUNTER — Encounter (HOSPITAL_COMMUNITY): Payer: Medicare Other | Attending: Oncology

## 2011-08-27 DIAGNOSIS — C189 Malignant neoplasm of colon, unspecified: Secondary | ICD-10-CM | POA: Insufficient documentation

## 2011-08-27 DIAGNOSIS — C180 Malignant neoplasm of cecum: Secondary | ICD-10-CM | POA: Diagnosis not present

## 2011-08-27 LAB — CBC
Hemoglobin: 9.8 g/dL — ABNORMAL LOW (ref 12.0–15.0)
MCH: 25.5 pg — ABNORMAL LOW (ref 26.0–34.0)
MCHC: 32.2 g/dL (ref 30.0–36.0)
MCV: 79 fL (ref 78.0–100.0)
Platelets: 193 10*3/uL (ref 150–400)

## 2011-08-27 NOTE — Progress Notes (Signed)
Labs drawn today for cbc,cea 

## 2011-08-28 LAB — CEA: CEA: 10.6 ng/mL — ABNORMAL HIGH (ref 0.0–5.0)

## 2011-09-22 ENCOUNTER — Encounter (HOSPITAL_COMMUNITY): Payer: Medicare Other | Attending: Oncology

## 2011-09-22 DIAGNOSIS — C180 Malignant neoplasm of cecum: Secondary | ICD-10-CM

## 2011-09-22 DIAGNOSIS — E876 Hypokalemia: Secondary | ICD-10-CM | POA: Diagnosis not present

## 2011-09-22 LAB — BASIC METABOLIC PANEL
BUN: 39 mg/dL — ABNORMAL HIGH (ref 6–23)
CO2: 21 mEq/L (ref 19–32)
Calcium: 9.4 mg/dL (ref 8.4–10.5)
Chloride: 111 mEq/L (ref 96–112)
Creatinine, Ser: 2.95 mg/dL — ABNORMAL HIGH (ref 0.50–1.10)

## 2011-09-22 NOTE — Progress Notes (Signed)
Labs drawn today for bmp 

## 2011-11-14 ENCOUNTER — Other Ambulatory Visit (HOSPITAL_COMMUNITY): Payer: Medicare Other

## 2011-11-17 ENCOUNTER — Encounter (HOSPITAL_COMMUNITY): Payer: Medicare Other | Attending: Oncology

## 2011-11-17 ENCOUNTER — Telehealth (HOSPITAL_COMMUNITY): Payer: Self-pay | Admitting: *Deleted

## 2011-11-17 DIAGNOSIS — C189 Malignant neoplasm of colon, unspecified: Secondary | ICD-10-CM | POA: Insufficient documentation

## 2011-11-17 DIAGNOSIS — D509 Iron deficiency anemia, unspecified: Secondary | ICD-10-CM | POA: Insufficient documentation

## 2011-11-17 LAB — BASIC METABOLIC PANEL
BUN: 75 mg/dL — ABNORMAL HIGH (ref 6–23)
Calcium: 9.9 mg/dL (ref 8.4–10.5)
Chloride: 103 mEq/L (ref 96–112)
Creatinine, Ser: 4 mg/dL — ABNORMAL HIGH (ref 0.50–1.10)
GFR calc Af Amer: 12 mL/min — ABNORMAL LOW (ref >90)
GFR calc non Af Amer: 10 mL/min — ABNORMAL LOW (ref >90)

## 2011-11-17 LAB — DIFFERENTIAL
Basophils Absolute: 0 10*3/uL (ref 0.0–0.1)
Basophils Relative: 1 % (ref 0–1)
Eosinophils Absolute: 0.3 10*3/uL (ref 0.0–0.7)
Eosinophils Relative: 7 % — ABNORMAL HIGH (ref 0–5)
Lymphocytes Relative: 35 % (ref 12–46)
Monocytes Absolute: 0.3 10*3/uL (ref 0.1–1.0)

## 2011-11-17 LAB — CBC
HCT: 31.7 % — ABNORMAL LOW (ref 36.0–46.0)
MCHC: 31.5 g/dL (ref 30.0–36.0)
MCV: 78.5 fL (ref 78.0–100.0)
Platelets: 232 10*3/uL (ref 150–400)
RDW: 14.4 % (ref 11.5–15.5)
WBC: 4.1 10*3/uL (ref 4.0–10.5)

## 2011-11-17 LAB — FERRITIN: Ferritin: 190 ng/mL (ref 10–291)

## 2011-11-17 LAB — CEA: CEA: 10.8 ng/mL — ABNORMAL HIGH (ref 0.0–5.0)

## 2011-11-17 NOTE — Telephone Encounter (Signed)
Message copied by Dennie Maizes on Mon Nov 17, 2011  2:20 PM ------      Message from: Ellouise Newer III      Created: Mon Nov 17, 2011  1:32 PM       Is she taking potassium?  Have her take it BID x 21 days

## 2011-11-18 ENCOUNTER — Other Ambulatory Visit (HOSPITAL_COMMUNITY): Payer: Self-pay

## 2011-11-18 DIAGNOSIS — E876 Hypokalemia: Secondary | ICD-10-CM

## 2011-11-18 MED ORDER — POTASSIUM CHLORIDE ER 10 MEQ PO TBCR: EXTENDED_RELEASE_TABLET | ORAL | Status: DC

## 2011-11-19 ENCOUNTER — Ambulatory Visit (HOSPITAL_COMMUNITY): Payer: Medicare Other | Admitting: Oncology

## 2011-11-21 ENCOUNTER — Ambulatory Visit (HOSPITAL_COMMUNITY): Payer: Medicare Other | Admitting: Oncology

## 2011-11-24 ENCOUNTER — Encounter (HOSPITAL_BASED_OUTPATIENT_CLINIC_OR_DEPARTMENT_OTHER): Payer: Medicare Other | Admitting: Oncology

## 2011-11-24 VITALS — BP 182/75 | HR 58 | Temp 97.8°F | Wt 153.2 lb

## 2011-11-24 DIAGNOSIS — C180 Malignant neoplasm of cecum: Secondary | ICD-10-CM

## 2011-11-24 DIAGNOSIS — C189 Malignant neoplasm of colon, unspecified: Secondary | ICD-10-CM

## 2011-11-24 DIAGNOSIS — D509 Iron deficiency anemia, unspecified: Secondary | ICD-10-CM | POA: Diagnosis not present

## 2011-11-24 NOTE — Progress Notes (Signed)
Avon Gully, MD, MD [address] [place] [address]  1. Adenocarcinoma of cecum  CBC, CEA, Basic metabolic panel, Ferritin, Ambulatory referral to Nephrology    CURRENT THERAPY:S/P resection on 07/09/2007. S/P Feraheme 510 mg infusion on 10/30/09 and 11/06/09   INTERVAL HISTORY: Destiny Boyd 78 y.o. female returns for  regular  visit for followup of Stage I Adenocarcinoma of the cecum.   The patient reports that she had a nice Easter holiday. The patient reports to me that she is living with a lady in [facility] because she broke her foot. Charlann is helping taking care of this lady.  Otherwise, the patient denies any complaints. She reports that she is urinating "frequently", she denies any burning, and hematuria. She also denies any diarrhea, constipation, pencil thin stools, blood in the stools, and black tarry stool.  I personally reviewed and went over laboratory results with the patient. The patient's CEA value although elevated remains very stable. She continues to remain mildly anemic. Her chemistries reveal hypokalemia, with an elevated BUN at 75 and elevated creatinine at 4.00. The patient may have an element of anemia of chronic disease secondary to renal failure/insufficiency. Due to the patient's BUN and creatinine, we will refer the patient to nephrology. Patient reports that she has not seen her primary care physician in quite some time. We will take care of this referral.  ROS: No TIA's or unusual headaches, no dysphagia.  No prolonged cough. No dyspnea or chest pain on exertion.  No abdominal pain, change in bowel habits, black or bloody stools.  No urinary tract symptoms.  No new or unusual musculoskeletal symptoms.  Normal menses, no abnormal vaginal bleeding, discharge or unexpected pelvic pain. No new breast lumps, breast pain or nipple discharge.  Patient does report a decreased appetite. I suggested to her that she try Glucerna to supplement her  meal intake.  She has not lost any weight in the past 3 months. She weighs in at 153 pounds which is consistent compared to her 07/21/2011 weight.     Past Medical History  Diagnosis Date  . Colon cancer     cecum cancer  . Iron deficiency anemia   . Diabetes mellitus   . Leukopenia   . Gout   . Hypercholesteremia   . Stroke 2005  . Obesity   . Adenocarcinoma of cecum 10/30/2008    has Adenocarcinoma of cecum; DM; HYPERTENSION; HEMORRHOIDS; HEMATURIA UNSPECIFIED; and TOBACCO USE, QUIT on her problem list.      has no known allergies.  [name] does not currently have medications on file.  Past Surgical History  Procedure Date  . Cataract extraction, bilateral     lens implants    Denies any headaches, dizziness, double vision, fevers, chills, night sweats, nausea, vomiting, diarrhea, constipation, chest pain, heart palpitations, shortness of breath, blood in stool, black tarry stool, urinary pain, urinary burning, urinary frequency, hematuria.   PHYSICAL EXAMINATION  ECOG PERFORMANCE STATUS: 0 - Asymptomatic  Filed Vitals:   11/24/11 1112  BP: 182/75  Pulse: 58  Temp: 97.8 F (36.6 C)    GENERAL:alert, no distress, well nourished, well developed, comfortable, cooperative and smiling SKIN: skin color, texture, turgor are normal, no rashes or significant lesions HEAD: Normocephalic, No masses, lesions, tenderness or abnormalities EYES: normal, Conjunctiva are pink and non-injected EARS: External ears normal OROPHARYNX:lips, buccal mucosa, and tongue normal and mucous membranes are moist  NECK: supple, trachea midline LYMPH:  no palpable lymphadenopathy BREAST:not examined  LUNGS: clear to auscultation and percussion HEART: regular rate & rhythm, no murmurs, no gallops, S1 normal and S2 normal ABDOMEN:abdomen soft, non-tender, obese and normal bowel sounds BACK: Back symmetric, no curvature., No CVA tenderness EXTREMITIES:less then 2 second capillary refill, no  joint deformities, effusion, or inflammation, no edema, no skin discoloration, no clubbing, no cyanosis  NEURO: alert & oriented x 3 with fluent speech, no focal motor/sensory deficits, gait normal    LABORATORY DATA: Results for Destiny, Boyd (MRN [id number]) as of 11/24/2011 11:50  Ref. Range 11/17/2011 08:42  Ferritin Latest Range: 10-291 ng/mL 190  WBC Latest Range: 4.0-10.5 K/uL 4.1  RBC Latest Range: 3.87-5.11 MIL/uL 4.04  HGB Latest Range: 12.0-15.0 g/dL 40.9 (L)  HCT Latest Range: 36.0-46.0 % 31.7 (L)  MCV Latest Range: 78.0-100.0 fL 78.5  MCH Latest Range: 26.0-34.0 pg 24.8 (L)  MCHC Latest Range: 30.0-36.0 g/dL 81.1  RDW Latest Range: 11.5-15.5 % 14.4  Platelets Latest Range: 150-400 K/uL 232  Neutrophils Relative Latest Range: 43-77 % 50  Lymphocytes Relative Latest Range: 12-46 % 35  Monocytes Relative Latest Range: 3-12 % 7  Eosinophils Relative Latest Range: 0-5 % 7 (H)  Basophils Relative Latest Range: 0-1 % 1  NEUT# Latest Range: 1.7-7.7 K/uL 2.1  Lymphocytes Absolute Latest Range: 0.7-4.0 K/uL 1.5  Monocytes Absolute Latest Range: 0.1-1.0 K/uL 0.3  Eosinophils Absolute Latest Range: 0.0-0.7 K/uL 0.3  Basophils Absolute Latest Range: 0.0-0.1 K/uL 0.0  Glucose Latest Range: 70-99 mg/dL 914 (H)  CEA Latest Range: 0.0-5.0 ng/mL 10.8 (H)    Results for Destiny, Boyd (MRN [id number]) as of 11/24/2011 11:50  Ref. Range 07/21/2011 10:23 08/27/2011 09:45 09/22/2011 09:03 11/17/2011 08:42  Sodium Latest Range: 135-145 mEq/L 140  144 140  Potassium Latest Range: 3.5-5.1 mEq/L 3.4 (L)  3.6 3.2 (L)  Chloride Latest Range: 96-112 mEq/L 106  111 103  CO2 Latest Range: 19-32 mEq/L 22  21 22   BUN Latest Range: 6-23 mg/dL 60 (H)  39 (H) 75 (H)  Creat Latest Range: 0.50-1.10 mg/dL 0.86 (H)  5.78 (H) 4.69 (H)  Calcium Latest Range: 8.4-10.5 mg/dL 9.4  9.4 9.9  GFR calc non Af Amer Latest Range: >90 mL/min 11 (L)  15 (L) 10 (L)  GFR calc Af Amer Latest Range: >90 mL/min 13 (L)  17 (L)  12 (L)  Glucose Latest Range: 70-99 mg/dL 629 (H)  528 (H) 413 (H)  Alkaline Phosphatase Latest Range: 39-117 U/L 144 (H)     Albumin Latest Range: 3.5-5.2 g/dL 3.8     AST Latest Range: 0-37 U/L 13     ALT Latest Range: 0-35 U/L 9     Total Protein Latest Range: 6.0-8.3 g/dL 7.7     Total Bilirubin Latest Range: 0.3-1.2 mg/dL 0.4        ASSESSMENT:  1. Stage I adenocarcinoma of cecum, S/P resection on 07/09/2007.  2. Iron deficiency anemia requiring intermittent feraheme infusion, S/P Feraheme 510 mg infusion on 10/30/09 and 11/06/09, likely secondary to #1   PLAN:  1. I personally reviewed and went over laboratory results with the patient. 2. Referral to Nephrology for elevated BUN and Creatinine 3. Lab work every 8 weeks: CBC, CEA 4. Lab work in 16 weeks: CBC, CEA, BMET, Ferritin 5. Continue with potassium, PO. 6. Recommend Glucerna to supplement her oral food intake. 7. Return in 5 months for follow-up.   All questions were answered. The patient knows to call the clinic with any problems, questions or concerns. We  can certainly see the patient much sooner if necessary.  The patient and plan discussed with Glenford Peers, MD and he is in agreement with the aforementioned.   Fatin Bachicha

## 2011-11-24 NOTE — Patient Instructions (Signed)
[facility]  Discharge Instructions  RECOMMENDATIONS MADE BY THE CONSULTANT AND ANY TEST RESULTS WILL BE SENT TO YOUR REFERRING DOCTOR.   Return to clinic as scheduled for lab work. Return to clinic in 5 months to see MD. We will refer you to a kidney doctor because your lab values have been abnormal.   I acknowledge that I have been informed and understand all the instructions given to me and received a copy. I do not have any more questions at this time, but understand that I may call the Specialty Clinic at [facility] at [phone] during business hours should I have any further questions or need assistance in obtaining follow-up care.    __________________________________________  _____________  __________ Signature of Patient or Authorized Representative            Date                   Time    __________________________________________ Nurse's Signature

## 2011-12-08 NOTE — Progress Notes (Signed)
Labs drawn

## 2011-12-16 DIAGNOSIS — I10 Essential (primary) hypertension: Secondary | ICD-10-CM | POA: Diagnosis not present

## 2011-12-16 DIAGNOSIS — E119 Type 2 diabetes mellitus without complications: Secondary | ICD-10-CM | POA: Diagnosis not present

## 2011-12-16 DIAGNOSIS — I6789 Other cerebrovascular disease: Secondary | ICD-10-CM | POA: Diagnosis not present

## 2011-12-31 ENCOUNTER — Other Ambulatory Visit (HOSPITAL_COMMUNITY): Payer: Medicare Other

## 2012-03-16 DIAGNOSIS — E119 Type 2 diabetes mellitus without complications: Secondary | ICD-10-CM | POA: Diagnosis not present

## 2012-03-16 DIAGNOSIS — D649 Anemia, unspecified: Secondary | ICD-10-CM | POA: Diagnosis not present

## 2012-03-16 DIAGNOSIS — E7800 Pure hypercholesterolemia, unspecified: Secondary | ICD-10-CM | POA: Diagnosis not present

## 2012-03-16 DIAGNOSIS — I10 Essential (primary) hypertension: Secondary | ICD-10-CM | POA: Diagnosis not present

## 2012-03-17 DIAGNOSIS — I10 Essential (primary) hypertension: Secondary | ICD-10-CM | POA: Diagnosis not present

## 2012-03-17 DIAGNOSIS — E7800 Pure hypercholesterolemia, unspecified: Secondary | ICD-10-CM | POA: Diagnosis not present

## 2012-03-17 DIAGNOSIS — D649 Anemia, unspecified: Secondary | ICD-10-CM | POA: Diagnosis not present

## 2012-03-17 DIAGNOSIS — E119 Type 2 diabetes mellitus without complications: Secondary | ICD-10-CM | POA: Diagnosis not present

## 2012-03-25 DIAGNOSIS — E119 Type 2 diabetes mellitus without complications: Secondary | ICD-10-CM | POA: Diagnosis not present

## 2012-03-25 DIAGNOSIS — N19 Unspecified kidney failure: Secondary | ICD-10-CM | POA: Diagnosis not present

## 2012-04-01 DIAGNOSIS — N19 Unspecified kidney failure: Secondary | ICD-10-CM | POA: Diagnosis not present

## 2012-04-01 DIAGNOSIS — I10 Essential (primary) hypertension: Secondary | ICD-10-CM | POA: Diagnosis not present

## 2012-04-01 DIAGNOSIS — E119 Type 2 diabetes mellitus without complications: Secondary | ICD-10-CM | POA: Diagnosis not present

## 2012-04-15 DIAGNOSIS — N19 Unspecified kidney failure: Secondary | ICD-10-CM | POA: Diagnosis not present

## 2012-04-15 DIAGNOSIS — E119 Type 2 diabetes mellitus without complications: Secondary | ICD-10-CM | POA: Diagnosis not present

## 2012-04-15 DIAGNOSIS — K319 Disease of stomach and duodenum, unspecified: Secondary | ICD-10-CM | POA: Diagnosis not present

## 2012-04-15 DIAGNOSIS — I10 Essential (primary) hypertension: Secondary | ICD-10-CM | POA: Diagnosis not present

## 2012-05-07 ENCOUNTER — Ambulatory Visit (HOSPITAL_COMMUNITY): Payer: Medicare Other | Admitting: Oncology

## 2012-05-17 DIAGNOSIS — Z23 Encounter for immunization: Secondary | ICD-10-CM | POA: Diagnosis not present

## 2012-05-17 DIAGNOSIS — E119 Type 2 diabetes mellitus without complications: Secondary | ICD-10-CM | POA: Diagnosis not present

## 2012-05-17 DIAGNOSIS — N19 Unspecified kidney failure: Secondary | ICD-10-CM | POA: Diagnosis not present

## 2012-05-17 DIAGNOSIS — I10 Essential (primary) hypertension: Secondary | ICD-10-CM | POA: Diagnosis not present

## 2012-05-20 ENCOUNTER — Encounter (HOSPITAL_COMMUNITY): Payer: Medicare Other | Attending: Oncology | Admitting: Oncology

## 2012-05-20 ENCOUNTER — Encounter (HOSPITAL_COMMUNITY): Payer: Self-pay | Admitting: Oncology

## 2012-05-20 VITALS — BP 188/86 | HR 65 | Temp 97.8°F | Resp 18 | Wt 159.0 lb

## 2012-05-20 DIAGNOSIS — Z85038 Personal history of other malignant neoplasm of large intestine: Secondary | ICD-10-CM | POA: Insufficient documentation

## 2012-05-20 DIAGNOSIS — Z09 Encounter for follow-up examination after completed treatment for conditions other than malignant neoplasm: Secondary | ICD-10-CM | POA: Diagnosis not present

## 2012-05-20 DIAGNOSIS — C189 Malignant neoplasm of colon, unspecified: Secondary | ICD-10-CM

## 2012-05-20 DIAGNOSIS — C180 Malignant neoplasm of cecum: Secondary | ICD-10-CM | POA: Diagnosis not present

## 2012-05-20 DIAGNOSIS — D509 Iron deficiency anemia, unspecified: Secondary | ICD-10-CM | POA: Insufficient documentation

## 2012-05-20 DIAGNOSIS — E611 Iron deficiency: Secondary | ICD-10-CM | POA: Insufficient documentation

## 2012-05-20 LAB — COMPREHENSIVE METABOLIC PANEL
ALT: 10 U/L (ref 0–35)
AST: 17 U/L (ref 0–37)
Albumin: 4 g/dL (ref 3.5–5.2)
CO2: 20 mEq/L (ref 19–32)
Calcium: 9.4 mg/dL (ref 8.4–10.5)
Chloride: 109 mEq/L (ref 96–112)
GFR calc non Af Amer: 13 mL/min — ABNORMAL LOW (ref >90)
Sodium: 141 mEq/L (ref 135–145)

## 2012-05-20 LAB — CBC WITH DIFFERENTIAL/PLATELET
Basophils Absolute: 0 10*3/uL (ref 0.0–0.1)
Basophils Relative: 1 % (ref 0–1)
Eosinophils Absolute: 0.4 10*3/uL (ref 0.0–0.7)
Hemoglobin: 9.5 g/dL — ABNORMAL LOW (ref 12.0–15.0)
MCH: 25 pg — ABNORMAL LOW (ref 26.0–34.0)
MCHC: 31.7 g/dL (ref 30.0–36.0)
Monocytes Relative: 8 % (ref 3–12)
Neutro Abs: 2.3 10*3/uL (ref 1.7–7.7)
Neutrophils Relative %: 53 % (ref 43–77)
RDW: 15.1 % (ref 11.5–15.5)

## 2012-05-20 NOTE — Patient Instructions (Signed)
[facility]  Discharge Instructions  RECOMMENDATIONS MADE BY THE CONSULTANT AND ANY TEST RESULTS WILL BE SENT TO YOUR REFERRING DOCTOR.   EXAM FINDINGS BY MD TODAY AND SIGNS AND SYMPTOMS TO REPORT TO CLINIC OR PRIMARY MD: Exam per T. Jacalyn Lefevre PA   Call us with any problems, belly pain, blood in stools or change in bowel habits.:   SPECIAL INSTRUCTIONS/FOLLOW-UP: Labs today and in 6 months then to see T. Kefalas PA   I acknowledge that I have been informed and understand all the instructions given to me and received a copy. I do not have any more questions at this time, but understand that I may call the Specialty Clinic at [facility] at [phone] during business hours should I have any further questions or need assistance in obtaining follow-up care.    __________________________________________  _____________  __________ Signature of Patient or Authorized Representative            Date                   Time    __________________________________________ Nurse's Signature

## 2012-05-20 NOTE — Progress Notes (Signed)
Destiny Gully, MD [address] [place] [address]  1. Adenocarcinoma of cecum  amLODipine (NORVASC) 5 MG tablet, hydrALAZINE (APRESOLINE) 25 MG tablet, linagliptin (TRADJENTA) 5 MG TABS tablet, CBC with Differential, Comprehensive metabolic panel, CEA, Ferritin, CEA, CBC with Differential, Comprehensive metabolic panel, CEA, Ferritin  2. Iron deficiency      CURRENT THERAPY: CEA observation  INTERVAL HISTORY: Destiny Boyd 78 y.o. female returns for  regular  visit for followup of Stage I Adenocarcinoma of the cecum S/P resection on 07/09/2007. AND Iron deficiency anemia requiring IV Feraheme occasionally.  S/P Feraheme 510 mg infusion on 10/30/09 and 11/06/09  The patient saw her PCP, Dr. Felecia Shelling, the other day and he referred her to a kidney specialist.  She has an appointment this month she reports.   Otherwise, Destiny Boyd is doing well.  She will be unable to go on a cruise this year due to finances, but she is saving up for a cruise next year.  Complete ROS questionings if negative.  She denies any changes in appetite, stool color or caliber, etc.     Past Medical History  Diagnosis Date  . Colon cancer     cecum cancer  . Iron deficiency anemia   . Diabetes mellitus   . Leukopenia   . Gout   . Hypercholesteremia   . Stroke 2005  . Obesity   . 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 Iron deficiency 05/20/2012    has Adenocarcinoma of cecum; DM; HYPERTENSION; HEMORRHOIDS; HEMATURIA UNSPECIFIED; TOBACCO USE, QUIT; and Iron deficiency on her problem list.      has no known allergies.  Destiny Boyd does not currently have medications on file.  Past Surgical History  Procedure Date  . Cataract extraction, bilateral     lens implants    Denies any headaches, dizziness, double vision, fevers, chills, night sweats, nausea, vomiting, diarrhea, constipation, chest pain, heart palpitations, shortness of breath, blood in stool, black tarry stool, urinary pain, urinary  burning, urinary frequency, hematuria.   PHYSICAL EXAMINATION  ECOG PERFORMANCE STATUS: 0 - Asymptomatic  Filed Vitals:   05/20/12 0936  BP: 188/86  Pulse: 65  Temp: 97.8 F (36.6 C)  Resp: 18    GENERAL:alert, no distress, well nourished, well developed, comfortable, cooperative and smiling SKIN: skin color, texture, turgor are normal, no rashes or significant lesions HEAD: Normocephalic, No masses, lesions, tenderness or abnormalities EYES: normal, Conjunctiva are pink and non-injected EARS: External ears normal OROPHARYNX:lips, buccal mucosa, and tongue normal and mucous membranes are moist  NECK: supple, no adenopathy, thyroid normal size, non-tender, without nodularity, no stridor, non-tender, trachea midline LYMPH:  no palpable lymphadenopathy BREAST:not examined LUNGS: clear to auscultation and percussion HEART: regular rate & rhythm, no murmurs, no gallops, S1 normal and S2 normal ABDOMEN:abdomen soft, non-tender, obese, normal bowel sounds, no masses or organomegaly and no hepatosplenomegaly BACK: Back symmetric, no curvature., No CVA tenderness EXTREMITIES:less then 2 second capillary refill, no joint deformities, effusion, or inflammation, no edema, no skin discoloration, no clubbing, no cyanosis  NEURO: alert & oriented x 3 with fluent speech, no focal motor/sensory deficits, gait normal    LABORATORY DATA: CBC    Component Value Date/Time   WBC 4.1 11/17/2011 0842   RBC 4.04 11/17/2011 0842   HGB 10.0* 11/17/2011 0842   HCT 31.7* 11/17/2011 0842   PLT 232 11/17/2011 0842   MCV 78.5 11/17/2011 0842   MCH 24.8* 11/17/2011 0842   MCHC 31.5 11/17/2011 0842   RDW 14.4 11/17/2011  0842   LYMPHSABS 1.5 11/17/2011 0842   MONOABS 0.3 11/17/2011 0842   EOSABS 0.3 11/17/2011 0842   BASOSABS 0.0 11/17/2011 0842      Chemistry      Component Value Date/Time   NA 140 11/17/2011 0842   K 3.2* 11/17/2011 0842   CL 103 11/17/2011 0842   CO2 22 11/17/2011 0842   BUN 75* 11/17/2011 0842   CREATININE  4.00* 11/17/2011 0842      Component Value Date/Time   CALCIUM 9.9 11/17/2011 0842   ALKPHOS 144* 07/21/2011 1023   AST 13 07/21/2011 1023   ALT 9 07/21/2011 1023   BILITOT 0.4 07/21/2011 1023     Lab Results  Component Value Date   FERRITIN 190 11/17/2011     PENDING LABS: CBC diff, CMET, CEA, Ferritin   ASSESSMENT:  1. Stage I adenocarcinoma of cecum, S/P resection on 07/09/2007 with stable, yet elevated CEA. 2. Iron deficiency anemia requiring intermittent feraheme infusion, S/P Feraheme 510 mg infusion on 10/30/09 and 11/06/09, likely secondary    PLAN:  1. Lab work today:  CEA, Ferritin 2. Will ascertain lab work from PCP. 3. Patient will follow-up with Nephrologist as recommended by PCP. 4. Lab work in 6 months: CBC diff, CMET, CEA, Ferritin 5. Return in 6 months for follow-up.    All questions were answered. The patient knows to call the clinic with any problems, questions or concerns. We can certainly see the patient much sooner if necessary.  Shafin Pollio

## 2012-05-24 ENCOUNTER — Other Ambulatory Visit (HOSPITAL_COMMUNITY): Payer: Self-pay | Admitting: Oncology

## 2012-06-14 ENCOUNTER — Other Ambulatory Visit (HOSPITAL_COMMUNITY): Payer: Self-pay | Admitting: Nephrology

## 2012-06-14 DIAGNOSIS — N289 Disorder of kidney and ureter, unspecified: Secondary | ICD-10-CM

## 2012-06-21 ENCOUNTER — Ambulatory Visit (HOSPITAL_COMMUNITY)
Admission: RE | Admit: 2012-06-21 | Discharge: 2012-06-21 | Disposition: A | Discharge status: Home / Self Care | Payer: Medicare Other | Source: Ambulatory Visit | Attending: Nephrology | Admitting: Nephrology

## 2012-06-21 DIAGNOSIS — R809 Proteinuria, unspecified: Secondary | ICD-10-CM | POA: Diagnosis not present

## 2012-06-21 DIAGNOSIS — N189 Chronic kidney disease, unspecified: Secondary | ICD-10-CM | POA: Diagnosis not present

## 2012-06-21 DIAGNOSIS — Q619 Cystic kidney disease, unspecified: Secondary | ICD-10-CM | POA: Insufficient documentation

## 2012-06-21 DIAGNOSIS — Z79899 Other long term (current) drug therapy: Secondary | ICD-10-CM | POA: Diagnosis not present

## 2012-06-21 DIAGNOSIS — N281 Cyst of kidney, acquired: Secondary | ICD-10-CM | POA: Diagnosis not present

## 2012-06-21 DIAGNOSIS — N289 Disorder of kidney and ureter, unspecified: Secondary | ICD-10-CM | POA: Insufficient documentation

## 2012-06-21 DIAGNOSIS — D649 Anemia, unspecified: Secondary | ICD-10-CM | POA: Diagnosis not present

## 2012-07-28 DIAGNOSIS — D649 Anemia, unspecified: Secondary | ICD-10-CM | POA: Diagnosis not present

## 2012-07-28 DIAGNOSIS — I10 Essential (primary) hypertension: Secondary | ICD-10-CM | POA: Diagnosis not present

## 2012-07-28 DIAGNOSIS — E872 Acidosis: Secondary | ICD-10-CM | POA: Diagnosis not present

## 2012-07-28 DIAGNOSIS — N184 Chronic kidney disease, stage 4 (severe): Secondary | ICD-10-CM | POA: Diagnosis not present

## 2012-10-18 DIAGNOSIS — N189 Chronic kidney disease, unspecified: Secondary | ICD-10-CM | POA: Diagnosis not present

## 2012-10-18 DIAGNOSIS — Z79899 Other long term (current) drug therapy: Secondary | ICD-10-CM | POA: Diagnosis not present

## 2012-10-18 DIAGNOSIS — I10 Essential (primary) hypertension: Secondary | ICD-10-CM | POA: Diagnosis not present

## 2012-10-18 DIAGNOSIS — D649 Anemia, unspecified: Secondary | ICD-10-CM | POA: Diagnosis not present

## 2012-10-18 DIAGNOSIS — E1165 Type 2 diabetes mellitus with hyperglycemia: Secondary | ICD-10-CM | POA: Diagnosis not present

## 2012-10-18 DIAGNOSIS — E559 Vitamin D deficiency, unspecified: Secondary | ICD-10-CM | POA: Diagnosis not present

## 2012-10-20 DIAGNOSIS — E872 Acidosis: Secondary | ICD-10-CM | POA: Diagnosis not present

## 2012-10-20 DIAGNOSIS — I10 Essential (primary) hypertension: Secondary | ICD-10-CM | POA: Diagnosis not present

## 2012-10-20 DIAGNOSIS — E559 Vitamin D deficiency, unspecified: Secondary | ICD-10-CM | POA: Diagnosis not present

## 2012-10-20 DIAGNOSIS — N184 Chronic kidney disease, stage 4 (severe): Secondary | ICD-10-CM | POA: Diagnosis not present

## 2012-10-20 DIAGNOSIS — R809 Proteinuria, unspecified: Secondary | ICD-10-CM | POA: Diagnosis not present

## 2012-10-20 DIAGNOSIS — N2581 Secondary hyperparathyroidism of renal origin: Secondary | ICD-10-CM | POA: Diagnosis not present

## 2012-10-20 DIAGNOSIS — D649 Anemia, unspecified: Secondary | ICD-10-CM | POA: Diagnosis not present

## 2012-11-18 ENCOUNTER — Other Ambulatory Visit (HOSPITAL_COMMUNITY): Payer: Medicare Other

## 2012-11-22 ENCOUNTER — Encounter (HOSPITAL_COMMUNITY): Payer: Medicare Other | Attending: Oncology | Admitting: Oncology

## 2012-11-22 ENCOUNTER — Encounter (HOSPITAL_COMMUNITY): Payer: Self-pay | Admitting: Oncology

## 2012-11-22 VITALS — BP 135/62 | HR 66 | Temp 97.9°F | Resp 16 | Wt 156.3 lb

## 2012-11-22 DIAGNOSIS — C180 Malignant neoplasm of cecum: Secondary | ICD-10-CM

## 2012-11-22 DIAGNOSIS — N19 Unspecified kidney failure: Secondary | ICD-10-CM | POA: Insufficient documentation

## 2012-11-22 DIAGNOSIS — Z0000 Encounter for general adult medical examination without abnormal findings: Secondary | ICD-10-CM

## 2012-11-22 DIAGNOSIS — Z85038 Personal history of other malignant neoplasm of large intestine: Secondary | ICD-10-CM | POA: Diagnosis not present

## 2012-11-22 DIAGNOSIS — E611 Iron deficiency: Secondary | ICD-10-CM

## 2012-11-22 DIAGNOSIS — D509 Iron deficiency anemia, unspecified: Secondary | ICD-10-CM | POA: Diagnosis not present

## 2012-11-22 DIAGNOSIS — Z09 Encounter for follow-up examination after completed treatment for conditions other than malignant neoplasm: Secondary | ICD-10-CM | POA: Diagnosis not present

## 2012-11-22 DIAGNOSIS — C189 Malignant neoplasm of colon, unspecified: Secondary | ICD-10-CM

## 2012-11-22 LAB — CBC WITH DIFFERENTIAL/PLATELET
Eosinophils Absolute: 0.2 10*3/uL (ref 0.0–0.7)
Eosinophils Relative: 5 % (ref 0–5)
HCT: 29.3 % — ABNORMAL LOW (ref 36.0–46.0)
Lymphocytes Relative: 23 % (ref 12–46)
Lymphs Abs: 0.9 10*3/uL (ref 0.7–4.0)
MCH: 24.3 pg — ABNORMAL LOW (ref 26.0–34.0)
MCV: 78.3 fL (ref 78.0–100.0)
Monocytes Absolute: 0.3 10*3/uL (ref 0.1–1.0)
Monocytes Relative: 7 % (ref 3–12)
Platelets: 195 10*3/uL (ref 150–400)
RBC: 3.74 MIL/uL — ABNORMAL LOW (ref 3.87–5.11)

## 2012-11-22 LAB — COMPREHENSIVE METABOLIC PANEL
AST: 15 U/L (ref 0–37)
Albumin: 3.6 g/dL (ref 3.5–5.2)
BUN: 41 mg/dL — ABNORMAL HIGH (ref 6–23)
Calcium: 8.8 mg/dL (ref 8.4–10.5)
Creatinine, Ser: 3.63 mg/dL — ABNORMAL HIGH (ref 0.50–1.10)

## 2012-11-22 LAB — FERRITIN: Ferritin: 63 ng/mL (ref 10–291)

## 2012-11-22 LAB — CEA: CEA: 7 ng/mL — ABNORMAL HIGH (ref 0.0–5.0)

## 2012-11-22 NOTE — Progress Notes (Signed)
[facility], MD [address] [place] [address]  Adenocarcinoma of cecum - Plan: CBC with Differential, Comprehensive metabolic panel, CEA, Ferritin  Iron deficiency  CURRENT THERAPY:CEA observation per NCCN guidelines  INTERVAL HISTORY: Destiny Boyd 79 y.o. female returns for  regular  visit for followup of Stage I Adenocarcinoma of the cecum S/P resection on 07/09/2007. AND Iron deficiency anemia requiring IV Feraheme occasionally. S/P Feraheme 510 mg infusion on 10/30/09 and 11/06/09.  [name] is here for her regular follow-up.  She reports that she is seeing Dr. Kristian Covey for her renal issues. I do not have any notes from him so I will ask our office staff for his last note or so.   [name] denies any complaints.  She reports that she feels well.  She denies any issues with her bowel.  She denies any blood in stool, black tarry stool, rectal pain, and pencil thin stools.   I personally reviewed and went over laboratory results with the patient.  Some of her labs are still pending, but her CBC is stable with moderate anemia and mild leukopenia which she is typically low-normal and an unremarkable differential.   I asked her about her last mammogram.  She reports that it was last year and she is not yet scheduled for another mammogram yet because she has not received a letter for this to be scheduled.  On chart review, her mammogram was last performed in 2009.  She laughed about this.  I provided her education regarding there importance of preventative health.  I will order a screening mammogram for this month.  She was encouraged to have an annual mammogram.   Oncologically, she denies any complaints and ROS questioning is negative.    Past Medical History  Diagnosis Date  . Colon cancer     cecum cancer  . Iron deficiency anemia   . Diabetes mellitus   . Leukopenia   . Gout   . Hypercholesteremia   . Stroke 2005  . Obesity   . 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  Iron deficiency 05/20/2012    has Adenocarcinoma of cecum; DM; HYPERTENSION; HEMORRHOIDS; HEMATURIA UNSPECIFIED; TOBACCO USE, QUIT; and Iron deficiency on her problem list.     has No Known Allergies.  Ms. Gardiner had no medications administered during this visit.  Past Surgical History  Procedure Laterality Date  . Cataract extraction, bilateral      lens implants    Denies any headaches, dizziness, double vision, fevers, chills, night sweats, nausea, vomiting, diarrhea, constipation, chest pain, heart palpitations, shortness of breath, blood in stool, black tarry stool, urinary pain, urinary burning, urinary frequency, hematuria.   PHYSICAL EXAMINATION  ECOG PERFORMANCE STATUS: 0 - Asymptomatic  Filed Vitals:   11/22/12 0943  BP: 135/62  Pulse: 66  Temp: 97.9 F (36.6 C)  Resp: 16    GENERAL:alert, no distress, well nourished, well developed, comfortable, cooperative and smiling  SKIN: skin color, texture, turgor are normal, no rashes or significant lesions  HEAD: Normocephalic, No masses, lesions, tenderness or abnormalities  EYES: normal, Conjunctiva are pink and non-injected  EARS: External ears normal  OROPHARYNX:lips, buccal mucosa, and tongue normal and mucous membranes are moist  NECK: supple, no adenopathy, thyroid normal size, non-tender, without nodularity, no stridor, non-tender, trachea midline  LYMPH: no palpable lymphadenopathy  BREAST:not examined  LUNGS: clear to auscultation and percussion  HEART: regular rate & rhythm, no murmurs, no gallops, S1 normal and S2 normal  ABDOMEN:abdomen soft, non-tender, obese, normal  bowel sounds, no masses or organomegaly and no hepatosplenomegaly  BACK: Back symmetric, no curvature., No CVA tenderness  EXTREMITIES:less then 2 second capillary refill, no joint deformities, effusion, or inflammation, no edema, no skin discoloration, no clubbing, no cyanosis  NEURO: alert & oriented x 3 with fluent speech, no focal  motor/sensory deficits, gait normal   LABORATORY DATA: CBC    Component Value Date/Time   WBC 3.7* 11/22/2012 0958   RBC 3.74* 11/22/2012 0958   HGB 9.1* 11/22/2012 0958   HCT 29.3* 11/22/2012 0958   PLT 195 11/22/2012 0958   MCV 78.3 11/22/2012 0958   MCH 24.3* 11/22/2012 0958   MCHC 31.1 11/22/2012 0958   RDW 15.8* 11/22/2012 0958   LYMPHSABS 0.9 11/22/2012 0958   MONOABS 0.3 11/22/2012 0958   EOSABS 0.2 11/22/2012 0958   BASOSABS 0.0 11/22/2012 0958      Chemistry      Component Value Date/Time   NA 141 05/20/2012 1048   K 3.8 05/20/2012 1048   CL 109 05/20/2012 1048   CO2 20 05/20/2012 1048   BUN 40* 05/20/2012 1048   CREATININE 3.26* 05/20/2012 1048      Component Value Date/Time   CALCIUM 9.4 05/20/2012 1048   ALKPHOS 124* 05/20/2012 1048   AST 17 05/20/2012 1048   ALT 10 05/20/2012 1048   BILITOT 0.4 05/20/2012 1048     Lab Results  Component Value Date   IRON 46 07/21/2011   TIBC 414 07/21/2011   FERRITIN 104 05/20/2012   Lab Results  Component Value Date   CEA 6.2* 05/20/2012      PENDING LABS: CEA, CMET, and Ferritin     ASSESSMENT:  1. Stage I adenocarcinoma of cecum, S/P resection on 07/09/2007 with stable, yet elevated CEA.  2. Iron deficiency anemia requiring intermittent feraheme infusion, S/P Feraheme 510 mg infusion on 10/30/09 and 11/06/09, likely secondary  3. Renal failure, followed by Dr. Kristian Covey.   PLAN:  1. I personally reviewed and went over laboratory results with the patient. 2. Labs today: CBC diff, CEA, Ferritin, CMET.  3. Labs in 6 months:  CBC diff, CMET, Ferritin 4. Screening mammogram ordered for the month of April 2014. 5. Will ask staff to ascertain Dr. Susa Griffins last notes.  6. Patient encouraged to continue follow-up with nephrology. 7. We will cease CEA monitoring due to the patient being out more than 5 years from her cancer per NCCN guidelines.  8. Return in 6 months for follow-up.  On the next visit we will need to see when her last colonoscopy  was performed and make sure she is following through with preventative health.    All questions were answered. The patient knows to call the clinic with any problems, questions or concerns. We can certainly see the patient much sooner if necessary.  The patient and plan discussed with Glenford Peers, MD and he is in agreement with the aforementioned.  Ethelyn Cerniglia

## 2012-11-22 NOTE — Patient Instructions (Addendum)
[facility] Discharge Instructions  RECOMMENDATIONS MADE BY THE CONSULTANT AND ANY TEST RESULTS WILL BE SENT TO YOUR REFERRING PHYSICIAN.  Lab work today. Lab work again in 6 months. Return to see Dr.Neijstrom in 6 months. We will get you scheduled for a mammogram sometime this month here at [facility]. Report any issues/concerns to clinic as needed.  Thank you for choosing [facility] to provide your oncology and hematology care.  To afford each patient quality time with our providers, please arrive at least 15 minutes before your scheduled appointment time.  With your help, our goal is to use those 15 minutes to complete the necessary work-up to ensure our physicians have the information they need to help with your evaluation and healthcare recommendations.    Effective January 1st, 2014, we ask that you re-schedule your appointment with our physicians should you arrive 10 or more minutes late for your appointment.  We strive to give you quality time with our providers, and arriving late affects you and other patients whose appointments are after yours.    Again, thank you for choosing [facility].  Our hope is that these requests will decrease the amount of time that you wait before being seen by our physicians.       _____________________________________________________________  Should you have questions after your visit to [facility], please contact our office at [phone] between the hours of 8:30 a.m. and 5:00 p.m.  Voicemails left after 4:30 p.m. will not be returned until the following business day.  For prescription refill requests, have your pharmacy contact our office with your prescription refill request.

## 2012-12-13 ENCOUNTER — Ambulatory Visit (HOSPITAL_COMMUNITY)
Admission: RE | Admit: 2012-12-13 | Discharge: 2012-12-13 | Disposition: A | Discharge status: Home / Self Care | Payer: Medicare Other | Source: Ambulatory Visit | Attending: Oncology | Admitting: Oncology

## 2012-12-13 DIAGNOSIS — Z1231 Encounter for screening mammogram for malignant neoplasm of breast: Secondary | ICD-10-CM | POA: Diagnosis not present

## 2012-12-13 DIAGNOSIS — Z0000 Encounter for general adult medical examination without abnormal findings: Secondary | ICD-10-CM

## 2012-12-14 ENCOUNTER — Telehealth (HOSPITAL_COMMUNITY): Payer: Self-pay

## 2012-12-14 NOTE — Telephone Encounter (Signed)
Patient called and informed of scan results.

## 2012-12-21 DIAGNOSIS — E119 Type 2 diabetes mellitus without complications: Secondary | ICD-10-CM | POA: Diagnosis not present

## 2012-12-21 DIAGNOSIS — D649 Anemia, unspecified: Secondary | ICD-10-CM | POA: Diagnosis not present

## 2012-12-21 DIAGNOSIS — I10 Essential (primary) hypertension: Secondary | ICD-10-CM | POA: Diagnosis not present

## 2012-12-21 DIAGNOSIS — N19 Unspecified kidney failure: Secondary | ICD-10-CM | POA: Diagnosis not present

## 2013-03-22 DIAGNOSIS — I10 Essential (primary) hypertension: Secondary | ICD-10-CM | POA: Diagnosis not present

## 2013-03-22 DIAGNOSIS — D649 Anemia, unspecified: Secondary | ICD-10-CM | POA: Diagnosis not present

## 2013-03-22 DIAGNOSIS — N19 Unspecified kidney failure: Secondary | ICD-10-CM | POA: Diagnosis not present

## 2013-03-22 DIAGNOSIS — E119 Type 2 diabetes mellitus without complications: Secondary | ICD-10-CM | POA: Diagnosis not present

## 2013-03-28 DIAGNOSIS — I10 Essential (primary) hypertension: Secondary | ICD-10-CM | POA: Diagnosis not present

## 2013-03-28 DIAGNOSIS — E559 Vitamin D deficiency, unspecified: Secondary | ICD-10-CM | POA: Diagnosis not present

## 2013-03-28 DIAGNOSIS — Z79899 Other long term (current) drug therapy: Secondary | ICD-10-CM | POA: Diagnosis not present

## 2013-03-28 DIAGNOSIS — R809 Proteinuria, unspecified: Secondary | ICD-10-CM | POA: Diagnosis not present

## 2013-03-28 DIAGNOSIS — D649 Anemia, unspecified: Secondary | ICD-10-CM | POA: Diagnosis not present

## 2013-04-06 DIAGNOSIS — N185 Chronic kidney disease, stage 5: Secondary | ICD-10-CM | POA: Diagnosis not present

## 2013-04-06 DIAGNOSIS — I10 Essential (primary) hypertension: Secondary | ICD-10-CM | POA: Diagnosis not present

## 2013-04-06 DIAGNOSIS — D649 Anemia, unspecified: Secondary | ICD-10-CM | POA: Diagnosis not present

## 2013-04-06 DIAGNOSIS — E559 Vitamin D deficiency, unspecified: Secondary | ICD-10-CM | POA: Diagnosis not present

## 2013-04-19 ENCOUNTER — Other Ambulatory Visit: Payer: Self-pay | Admitting: *Deleted

## 2013-04-19 DIAGNOSIS — Z01810 Encounter for preprocedural cardiovascular examination: Secondary | ICD-10-CM

## 2013-04-19 DIAGNOSIS — N185 Chronic kidney disease, stage 5: Secondary | ICD-10-CM

## 2013-04-28 ENCOUNTER — Encounter (HOSPITAL_COMMUNITY): Payer: Medicare Other | Attending: Hematology and Oncology

## 2013-04-28 DIAGNOSIS — C180 Malignant neoplasm of cecum: Secondary | ICD-10-CM | POA: Diagnosis not present

## 2013-04-28 DIAGNOSIS — Z85038 Personal history of other malignant neoplasm of large intestine: Secondary | ICD-10-CM | POA: Insufficient documentation

## 2013-04-28 DIAGNOSIS — C189 Malignant neoplasm of colon, unspecified: Secondary | ICD-10-CM

## 2013-04-28 DIAGNOSIS — Z09 Encounter for follow-up examination after completed treatment for conditions other than malignant neoplasm: Secondary | ICD-10-CM | POA: Diagnosis not present

## 2013-04-28 DIAGNOSIS — E611 Iron deficiency: Secondary | ICD-10-CM

## 2013-04-28 LAB — CBC WITH DIFFERENTIAL/PLATELET
Eosinophils Relative: 6 % — ABNORMAL HIGH (ref 0–5)
HCT: 29.6 % — ABNORMAL LOW (ref 36.0–46.0)
Lymphocytes Relative: 32 % (ref 12–46)
Lymphs Abs: 1.3 10*3/uL (ref 0.7–4.0)
MCV: 78.7 fL (ref 78.0–100.0)
Monocytes Absolute: 0.3 10*3/uL (ref 0.1–1.0)
Neutro Abs: 2.2 10*3/uL (ref 1.7–7.7)
RBC: 3.76 MIL/uL — ABNORMAL LOW (ref 3.87–5.11)
WBC: 4 10*3/uL (ref 4.0–10.5)

## 2013-04-28 LAB — COMPREHENSIVE METABOLIC PANEL
ALT: 8 U/L (ref 0–35)
AST: 17 U/L (ref 0–37)
CO2: 20 mEq/L (ref 19–32)
Calcium: 9.1 mg/dL (ref 8.4–10.5)
Chloride: 109 mEq/L (ref 96–112)
GFR calc Af Amer: 13 mL/min — ABNORMAL LOW (ref >90)
GFR calc non Af Amer: 11 mL/min — ABNORMAL LOW (ref >90)
Glucose, Bld: 169 mg/dL — ABNORMAL HIGH (ref 70–99)
Sodium: 143 mEq/L (ref 135–145)
Total Bilirubin: 0.5 mg/dL (ref 0.3–1.2)

## 2013-04-28 NOTE — Progress Notes (Signed)
Labs drawn today for cbc/diff,cmp,ferr

## 2013-04-29 ENCOUNTER — Other Ambulatory Visit (HOSPITAL_COMMUNITY): Payer: Self-pay | Admitting: Oncology

## 2013-04-29 DIAGNOSIS — E876 Hypokalemia: Secondary | ICD-10-CM

## 2013-04-29 MED ORDER — POTASSIUM CHLORIDE ER 10 MEQ PO TBCR: 10.0000 meq | EXTENDED_RELEASE_TABLET | Freq: Every day | ORAL | Status: DC

## 2013-05-03 ENCOUNTER — Encounter: Payer: Self-pay | Admitting: Nephrology

## 2013-05-05 ENCOUNTER — Encounter (HOSPITAL_COMMUNITY): Payer: Self-pay

## 2013-05-05 ENCOUNTER — Encounter (HOSPITAL_BASED_OUTPATIENT_CLINIC_OR_DEPARTMENT_OTHER): Payer: Medicare Other

## 2013-05-05 VITALS — BP 165/99 | HR 93 | Temp 97.9°F | Resp 18 | Wt 155.0 lb

## 2013-05-05 DIAGNOSIS — N189 Chronic kidney disease, unspecified: Secondary | ICD-10-CM | POA: Diagnosis not present

## 2013-05-05 DIAGNOSIS — Z23 Encounter for immunization: Secondary | ICD-10-CM

## 2013-05-05 DIAGNOSIS — C180 Malignant neoplasm of cecum: Secondary | ICD-10-CM | POA: Diagnosis not present

## 2013-05-05 DIAGNOSIS — D638 Anemia in other chronic diseases classified elsewhere: Secondary | ICD-10-CM | POA: Diagnosis not present

## 2013-05-05 DIAGNOSIS — E611 Iron deficiency: Secondary | ICD-10-CM

## 2013-05-05 DIAGNOSIS — D631 Anemia in chronic kidney disease: Secondary | ICD-10-CM | POA: Insufficient documentation

## 2013-05-05 DIAGNOSIS — C189 Malignant neoplasm of colon, unspecified: Secondary | ICD-10-CM

## 2013-05-05 DIAGNOSIS — Z0000 Encounter for general adult medical examination without abnormal findings: Secondary | ICD-10-CM

## 2013-05-05 MED ORDER — INFLUENZA VAC SPLIT QUAD 0.5 ML IM SUSP
0.5000 mL | INTRAMUSCULAR | Status: AC
  Administered 2013-05-05: 0.5 mL via INTRAMUSCULAR
  Filled 2013-05-05: qty 0.5

## 2013-05-05 NOTE — Progress Notes (Signed)
Destiny Boyd presents today for injection per MD orders. Flu vaccine administered IM in right Upper Arm. Administration without incident. Patient tolerated well.

## 2013-05-05 NOTE — Addendum Note (Signed)
Addended by: Edythe Lynn A on: 05/05/2013 01:48 PM   Modules accepted: Orders

## 2013-05-05 NOTE — Progress Notes (Signed)
[facility] OFFICE PROGRESS Destiny Kuba, MD [address] [place] [address]  DIAGNOSIS: Adenocarcinoma of cecum  Iron deficiency  Chief Complaint  Patient presents with  . Colon Cancer    CURRENT THERAPY: No active antineoplastic therapy.  INTERVAL HISTORY: Destiny Boyd 79 y.o. female returns for followup of stage I carcinoma of the cecum, status post right hemicolectomy in 2008. Patient states she has been anemic for all her life practically. She denies any worsening shortness of breath, fatigue, diarrhea, constipation, melena, hematochezia, hematuria, lower extremity swelling or redness, cough, wheezing, headache, skin rash, or seizures.   MEDICAL HISTORY: Past Medical History  Diagnosis Date  . Colon cancer     cecum cancer  . Iron deficiency anemia   . Diabetes mellitus   . Leukopenia   . Gout   . Hypercholesteremia   . Stroke 2005  . Obesity   . 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 Iron deficiency 05/20/2012    INTERIM HISTORY: has Adenocarcinoma of cecum; DM; HYPERTENSION; HEMORRHOIDS; HEMATURIA UNSPECIFIED; TOBACCO USE, QUIT; and Iron deficiency on her problem list.    ALLERGIES:  has No Known Allergies.  MEDICATIONS: has a current medication list which includes the following prescription(s): amlodipine, aspirin, atenolol, calcitriol, cholecalciferol, hydralazine, linagliptin, and potassium chloride.  SURGICAL HISTORY:  Past Surgical History  Procedure Laterality Date  . Cataract extraction, bilateral      lens implants    REVIEW OF SYSTEMS:   Constitutional: Denies fevers, chills or abnormal weight loss Eyes: Denies blurriness of vision Ears, nose, mouth, throat, and face: Denies mucositis or sore throat Respiratory: Denies cough, dyspnea or wheezes Cardiovascular: Denies palpitation, chest discomfort or lower extremity swelling Gastrointestinal:  Denies nausea, heartburn or change in bowel habits Skin: Denies  abnormal skin rashes Lymphatics: Denies new lymphadenopathy or easy bruising Neurological:Denies numbness, tingling or new weaknesses Behavioral/Psych: Mood is stable, no new changes  All other systems were reviewed with the patient and are negative.  PHYSICAL EXAMINATION: ECOG PERFORMANCE STATUS: 0 - Asymptomatic  Blood pressure 165/99, pulse 93, temperature 97.9 F (36.6 C), temperature source Oral, resp. rate 18, weight 155 lb (70.308 kg).  GENERAL:alert, no distress and comfortable SKIN: skin color, texture, turgor are normal, no rashes or significant lesions EYES: normal, Conjunctiva are pink and non-injected, sclera clear OROPHARYNX:no exudate, no erythema and lips, buccal mucosa, and tongue normal  NECK: supple, thyroid normal size, non-tender, without nodularity LYMPH:  no palpable lymphadenopathy in the cervical, axillary or inguinal LUNGS: clear to auscultation and percussion with normal breathing effort HEART: regular rate & rhythm and no murmurs and no lower extremity edema ABDOMEN:abdomen soft, non-tender and normal bowel sounds Musculoskeletal:no cyanosis of digits and no clubbing  NEURO: alert & oriented x 3 with fluent speech, no focal motor/sensory deficits   LABORATORY DATA:  Infusion on 04/28/2013  Component Date Value Range Status  . WBC 04/28/2013 4.0  4.0 - 10.5 K/uL Final  . RBC 04/28/2013 3.76* 3.87 - 5.11 MIL/uL Final  . Hemoglobin 04/28/2013 9.3* 12.0 - 15.0 g/dL Final  . HCT 19/14/7829 29.6* 36.0 - 46.0 % Final  . MCV 04/28/2013 78.7  78.0 - 100.0 fL Final  . MCH 04/28/2013 24.7* 26.0 - 34.0 pg Final  . MCHC 04/28/2013 31.4  30.0 - 36.0 g/dL Final  . RDW 56/21/3086 15.6* 11.5 - 15.5 % Final  . Platelets 04/28/2013 177  150 - 400 K/uL Final  . Neutrophils Relative % 04/28/2013 55  43 - 77 %  Final  . Neutro Abs 04/28/2013 2.2  1.7 - 7.7 K/uL Final  . Lymphocytes Relative 04/28/2013 32  12 - 46 % Final  . Lymphs Abs 04/28/2013 1.3  0.7 - 4.0 K/uL Final   . Monocytes Relative 04/28/2013 7  3 - 12 % Final  . Monocytes Absolute 04/28/2013 0.3  0.1 - 1.0 K/uL Final  . Eosinophils Relative 04/28/2013 6* 0 - 5 % Final  . Eosinophils Absolute 04/28/2013 0.3  0.0 - 0.7 K/uL Final  . Basophils Relative 04/28/2013 1  0 - 1 % Final  . Basophils Absolute 04/28/2013 0.0  0.0 - 0.1 K/uL Final  . Sodium 04/28/2013 143  135 - 145 mEq/L Final  . Potassium 04/28/2013 3.4* 3.5 - 5.1 mEq/L Final  . Chloride 04/28/2013 109  96 - 112 mEq/L Final  . CO2 04/28/2013 20  19 - 32 mEq/L Final  . Glucose, Bld 04/28/2013 169* 70 - 99 mg/dL Final  . BUN 45/40/9811 35* 6 - 23 mg/dL Final  . Creatinine, Ser 04/28/2013 3.62* 0.50 - 1.10 mg/dL Final  . Calcium 91/47/8295 9.1  8.4 - 10.5 mg/dL Final  . Total Protein 04/28/2013 7.8  6.0 - 8.3 g/dL Final  . Albumin 62/13/0865 3.8  3.5 - 5.2 g/dL Final  . AST 78/46/9629 17  0 - 37 U/L Final  . ALT 04/28/2013 8  0 - 35 U/L Final  . Alkaline Phosphatase 04/28/2013 118* 39 - 117 U/L Final  . Total Bilirubin 04/28/2013 0.5  0.3 - 1.2 mg/dL Final  . GFR calc non Af Amer 04/28/2013 11* >90 mL/min Final  . GFR calc Af Amer 04/28/2013 13* >90 mL/min Final   Comment: (NOTE)                          The eGFR has been calculated using the CKD EPI equation.                          This calculation has not been validated in all clinical situations.                          eGFR's persistently <90 mL/min signify possible Chronic Kidney                          Disease.  . Ferritin 04/28/2013 64  10 - 291 ng/mL Final   Performed at [facility]     Urinalysis    Component Value Date/Time   COLORURINE YELLOW 11/22/2008 2014   APPEARANCEUR CLEAR 11/22/2008 2014   LABSPEC 1.020 11/22/2008 2014   PHURINE 5.5 11/22/2008 2014   GLUCOSEU NEGATIVE 07/07/2007 0956   HGBUR TRACE* 07/07/2007 0956   BILIRUBINUR NEG 11/22/2008 2014   KETONESUR NEG mg/dL 12/18/8411 2440   PROTEINUR 30* 11/22/2008 2014   UROBILINOGEN 0.2 11/22/2008 2014    NITRITE NEG 11/22/2008 2014   LEUKOCYTESUR NEG 11/22/2008 2014    RADIOGRAPHIC STUDIES: No results found.  ASSESSMENT: #1. Stage I carcinoma of the cecum, status post resection 07/09/2007, no indication for postoperative therapy. #2. Anemia of chronic disease currently with normal ferritin. #3 chronic kidney disease with GFR of 11.   PLAN: #1. Patient is given influenza virus vaccine. #2. Last colonoscopy was last year with plans to repeat in another year. #3 patient is also continue her current medications and of course any  new symptoms occur that are troublesome and/or persistent.   All questions were answered. The patient knows to call the clinic with any problems, questions or concerns. We can certainly see the patient much sooner if necessary.  The patient and plan discussed with Alla German A and he is in agreement with the aforementioned.  I spent 30 minutes counseling the patient face to face. The total time spent in the appointment was 25 minutes.    Maurilio Lovely, MD 05/05/2013 11:49 AM

## 2013-05-05 NOTE — Patient Instructions (Addendum)
.  [facility] Discharge Instructions  RECOMMENDATIONS MADE BY THE CONSULTANT AND ANY TEST RESULTS WILL BE SENT TO YOUR REFERRING PHYSICIAN.  EXAM FINDINGS BY THE PHYSICIAN TODAY AND SIGNS OR SYMPTOMS TO REPORT TO CLINIC OR PRIMARY PHYSICIAN: Exam and findings as discussed by Dr. Zigmund Daniel.   We will give you flu shot today Return in 6 months with labs  Thank you for choosing [facility] to provide your oncology and hematology care.  To afford each patient quality time with our providers, please arrive at least 15 minutes before your scheduled appointment time.  With your help, our goal is to use those 15 minutes to complete the necessary work-up to ensure our physicians have the information they need to help with your evaluation and healthcare recommendations.    Effective January 1st, 2014, we ask that you re-schedule your appointment with our physicians should you arrive 10 or more minutes late for your appointment.  We strive to give you quality time with our providers, and arriving late affects you and other patients whose appointments are after yours.    Again, thank you for choosing [facility].  Our hope is that these requests will decrease the amount of time that you wait before being seen by our physicians.       _____________________________________________________________  Should you have questions after your visit to [facility], please contact our office at [phone] between the hours of 8:30 a.m. and 5:00 p.m.  Voicemails left after 4:30 p.m. will not be returned until the following business day.  For prescription refill requests, have your pharmacy contact our office with your prescription refill request.

## 2013-05-17 DIAGNOSIS — D649 Anemia, unspecified: Secondary | ICD-10-CM | POA: Diagnosis not present

## 2013-05-17 DIAGNOSIS — N19 Unspecified kidney failure: Secondary | ICD-10-CM | POA: Diagnosis not present

## 2013-05-17 DIAGNOSIS — I10 Essential (primary) hypertension: Secondary | ICD-10-CM | POA: Diagnosis not present

## 2013-05-17 DIAGNOSIS — E119 Type 2 diabetes mellitus without complications: Secondary | ICD-10-CM | POA: Diagnosis not present

## 2013-05-18 ENCOUNTER — Other Ambulatory Visit (HOSPITAL_COMMUNITY): Payer: Medicare Other

## 2013-05-18 ENCOUNTER — Ambulatory Visit: Payer: Medicare Other | Admitting: Vascular Surgery

## 2013-05-20 ENCOUNTER — Other Ambulatory Visit (HOSPITAL_COMMUNITY): Payer: Medicare Other

## 2013-05-24 ENCOUNTER — Ambulatory Visit (HOSPITAL_COMMUNITY): Payer: Medicare Other

## 2013-05-26 DIAGNOSIS — I10 Essential (primary) hypertension: Secondary | ICD-10-CM | POA: Diagnosis not present

## 2013-05-26 DIAGNOSIS — Z79899 Other long term (current) drug therapy: Secondary | ICD-10-CM | POA: Diagnosis not present

## 2013-05-26 DIAGNOSIS — N189 Chronic kidney disease, unspecified: Secondary | ICD-10-CM | POA: Diagnosis not present

## 2013-05-26 DIAGNOSIS — E559 Vitamin D deficiency, unspecified: Secondary | ICD-10-CM | POA: Diagnosis not present

## 2013-05-26 DIAGNOSIS — D649 Anemia, unspecified: Secondary | ICD-10-CM | POA: Diagnosis not present

## 2013-05-26 DIAGNOSIS — R809 Proteinuria, unspecified: Secondary | ICD-10-CM | POA: Diagnosis not present

## 2013-06-08 DIAGNOSIS — D649 Anemia, unspecified: Secondary | ICD-10-CM | POA: Diagnosis not present

## 2013-06-08 DIAGNOSIS — N2581 Secondary hyperparathyroidism of renal origin: Secondary | ICD-10-CM | POA: Diagnosis not present

## 2013-06-08 DIAGNOSIS — E559 Vitamin D deficiency, unspecified: Secondary | ICD-10-CM | POA: Diagnosis not present

## 2013-06-08 DIAGNOSIS — N184 Chronic kidney disease, stage 4 (severe): Secondary | ICD-10-CM | POA: Diagnosis not present

## 2013-06-08 DIAGNOSIS — E872 Acidosis: Secondary | ICD-10-CM | POA: Diagnosis not present

## 2013-06-14 ENCOUNTER — Encounter: Payer: Self-pay | Admitting: Vascular Surgery

## 2013-06-15 ENCOUNTER — Ambulatory Visit: Payer: Medicare Other | Admitting: Vascular Surgery

## 2013-06-15 ENCOUNTER — Encounter (HOSPITAL_COMMUNITY): Payer: Medicare Other

## 2013-06-28 ENCOUNTER — Other Ambulatory Visit: Payer: Self-pay | Admitting: *Deleted

## 2013-06-28 DIAGNOSIS — N186 End stage renal disease: Secondary | ICD-10-CM

## 2013-06-28 DIAGNOSIS — Z01810 Encounter for preprocedural cardiovascular examination: Secondary | ICD-10-CM

## 2013-06-29 ENCOUNTER — Encounter: Payer: Self-pay | Admitting: Vascular Surgery

## 2013-06-30 ENCOUNTER — Encounter: Payer: Self-pay | Admitting: Vascular Surgery

## 2013-06-30 ENCOUNTER — Ambulatory Visit (INDEPENDENT_AMBULATORY_CARE_PROVIDER_SITE_OTHER)
Admission: RE | Admit: 2013-06-30 | Discharge: 2013-06-30 | Disposition: A | Discharge status: Home / Self Care | Payer: Medicare Other | Source: Ambulatory Visit | Attending: Vascular Surgery | Admitting: Vascular Surgery

## 2013-06-30 ENCOUNTER — Ambulatory Visit (HOSPITAL_COMMUNITY)
Admission: RE | Admit: 2013-06-30 | Discharge: 2013-06-30 | Disposition: A | Discharge status: Home / Self Care | Payer: Medicare Other | Source: Ambulatory Visit | Attending: Vascular Surgery | Admitting: Vascular Surgery

## 2013-06-30 ENCOUNTER — Ambulatory Visit (INDEPENDENT_AMBULATORY_CARE_PROVIDER_SITE_OTHER): Payer: Medicare Other | Admitting: Vascular Surgery

## 2013-06-30 VITALS — BP 161/70 | HR 58 | Resp 16 | Ht 64.5 in | Wt 155.3 lb

## 2013-06-30 DIAGNOSIS — N186 End stage renal disease: Secondary | ICD-10-CM | POA: Insufficient documentation

## 2013-06-30 DIAGNOSIS — Z01810 Encounter for preprocedural cardiovascular examination: Secondary | ICD-10-CM | POA: Diagnosis not present

## 2013-06-30 DIAGNOSIS — N185 Chronic kidney disease, stage 5: Secondary | ICD-10-CM

## 2013-06-30 NOTE — Progress Notes (Signed)
[facility] [place] HISTORY AND PHYSICAL   History of Present Illness:  Patient is a 79 y.o. year old female who presents for placement of a permanent hemodialysis access. She is referred by Dr. Fausto Skillern. The patient is right handed .  The patient is not currently on hemodialysis.  The cause of renal failure is thought to be secondary to diabetes and hypertension.  Other chronic medical problems include anemia, leukopenia, elevated cholesterol, prior history of stroke, obesity, prior history of colon cancer. All these are currently stable.  Past Medical History  Diagnosis Date  . Colon cancer     cecum cancer  . Iron deficiency anemia   . Diabetes mellitus   . Leukopenia   . Gout   . Hypercholesteremia   . Stroke 2005  . Obesity   . 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 Iron deficiency 05/20/2012  . Chronic kidney disease   . Hypertension     Past Surgical History  Procedure Laterality Date  . Cataract extraction, bilateral      lens implants  . Abdominal hysterectomy       Social History History  Substance Use Topics  . Smoking status: Former Games developer  . Smokeless tobacco: Never Used  . Alcohol Use: No    Family History Family History  Problem Relation Age of Onset  . Diabetes Mother   . Hypertension Mother     Allergies  No Known Allergies   Current Outpatient Prescriptions  Medication Sig Dispense Refill  . amLODipine (NORVASC) 10 MG tablet Take 10 mg by mouth daily.      Marland Kitchen aspirin 81 MG tablet Take 81 mg by mouth daily.        Marland Kitchen atenolol (TENORMIN) 100 MG tablet Take 100 mg by mouth daily.        . calcitRIOL (ROCALTROL) 0.25 MCG capsule Take 0.25 mcg by mouth 3 (three) times a week. Takes 1 capsule on Mondays, Wednesdays and Fridays      . Cholecalciferol (D3 HIGH POTENCY) 1000 UNITS capsule Take 1,000 Units by mouth daily.      . hydrALAZINE (APRESOLINE) 50 MG tablet Take 50 mg by mouth 2 (two) times daily.      Marland Kitchen linagliptin  (TRADJENTA) 5 MG TABS tablet Take 5 mg by mouth daily.      . potassium chloride (K-DUR) 10 MEQ tablet Take 1 tablet (10 mEq total) by mouth daily.  30 tablet  0   No current facility-administered medications for this visit.    ROS:   General:  No weight loss, Fever, chills  HEENT: No recent headaches, no nasal bleeding, no visual changes, no sore throat  Neurologic: No dizziness, blackouts, seizures. No recent symptoms of stroke or mini- stroke. No recent episodes of slurred speech, or temporary blindness.  Cardiac: No recent episodes of chest pain/pressure, no shortness of breath at rest.  +shortness of breath with exertion.  Denies history of atrial fibrillation or irregular heartbeat  Vascular: No history of rest pain in feet.  No history of claudication.  No history of non-healing ulcer, No history of DVT   Pulmonary: No home oxygen, no productive cough, no hemoptysis,  No asthma or wheezing  Musculoskeletal:  [ ]  Arthritis, [ ]  Low back pain,  [ ]  Joint pain  Hematologic:No history of hypercoagulable state.  No history of easy bleeding.  + history of anemia  Gastrointestinal: No hematochezia or melena,  No gastroesophageal reflux, no trouble swallowing  Urinary: [x ]  chronic Kidney disease, [ ]  on HD - [ ]  MWF or [ ]  TTHS, [ ]  Burning with urination, [ ]  Frequent urination, [ ]  Difficulty urinating;   Skin: No rashes  Psychological: No history of anxiety,  No history of depression   Physical Examination  Filed Vitals:   06/30/13 1317  BP: 161/70  Pulse: 58  Resp: 16  Height: 5' 4.5" (1.638 m)  Weight: 155 lb 4.8 oz (70.444 kg)    Body mass index is 26.26 kg/(m^2).  General:  Alert and oriented, no acute distress HEENT: Normal Neck: No bruit or JVD Pulmonary: Clear to auscultation bilaterally Cardiac: Regular Rate and Rhythm without murmur Gastrointestinal: Soft, non-tender, non-distended, no mass Skin: No rash Extremity Pulses:  2+ radial, brachial pulses  bilaterally Musculoskeletal: No deformity or edema  Neurologic: Upper and lower extremity motor 5/5 and symmetric  DATA: The patient had a vein mapping ultrasound of both upper extremities today. This showed the cephalic vein in the forearm is too small for use. The cephalic vein in the upper arm bilaterally is greater than 3 mm. The basilic vein is fairly small in the left. The right basilic vein is also fairly small but potentially usable if necessary. I reviewed and interpreted this study.   ASSESSMENT: Patient needs long-term hemodialysis access.   PLAN:  Left brachiocephalic AV fistula 07/26/2013. Risks benefits possible complications and procedure details including but not limited to non-maturation of fistula, ischemic steal, bleeding infection The patient today. She understands and agrees to proceed.  Fabienne Bruns, MD [facility] [place] Office: [phone] Pager: [phone]

## 2013-07-18 ENCOUNTER — Encounter (HOSPITAL_COMMUNITY): Payer: Self-pay | Admitting: Pharmacist

## 2013-07-19 ENCOUNTER — Other Ambulatory Visit: Payer: Self-pay

## 2013-08-09 DIAGNOSIS — I10 Essential (primary) hypertension: Secondary | ICD-10-CM | POA: Diagnosis not present

## 2013-08-09 DIAGNOSIS — N19 Unspecified kidney failure: Secondary | ICD-10-CM | POA: Diagnosis not present

## 2013-08-09 DIAGNOSIS — D649 Anemia, unspecified: Secondary | ICD-10-CM | POA: Diagnosis not present

## 2013-08-09 DIAGNOSIS — E119 Type 2 diabetes mellitus without complications: Secondary | ICD-10-CM | POA: Diagnosis not present

## 2013-08-22 NOTE — Pre-Procedure Instructions (Signed)
Destiny Boyd  08/22/2013   Your procedure is scheduled on:  Wednesday, Jan. 7th             [facility] Parking is available)  Report to [facility] Main Entrance "A" at 8:30 AM.  Call this number if you have problems the morning of surgery: [phone]   Remember:   Do not eat food or drink liquids after midnight Tuesday   Take these medicines the morning of surgery with A SIP OF WATER: Norvasc, Atenolol   Do not wear jewelry, make-up or nail polish.  Do not wear lotions, powders, or perfumes. You may NOT wear deodorant.  Do not shave underarms & legs 48 hours prior to surgery.    Do not bring valuables to the hospital.  [facility] is not responsible for any belongings or valuables.               Contacts, dentures or bridgework may not be worn into surgery.  Leave suitcase in the car. After surgery it may be brought to your room.               Patients discharged the day of surgery will not be allowed to drive home.   Name and phone number of your driver:    Special Instructions: Shower using CHG 2 nights before surgery and the night before surgery.  If you shower the day of surgery use CHG.  Use special wash - you have one bottle of CHG for all showers.  You should use approximately 1/3 of the bottle for each shower.   Please read over the following fact sheets that you were given: Pain Booklet and Surgical Site Infection Prevention

## 2013-08-23 ENCOUNTER — Encounter (HOSPITAL_COMMUNITY): Payer: Self-pay

## 2013-08-23 ENCOUNTER — Encounter (HOSPITAL_COMMUNITY)
Admission: RE | Admit: 2013-08-23 | Discharge: 2013-08-23 | Disposition: A | Discharge status: Home / Self Care | Payer: Medicare Other | Source: Ambulatory Visit | Attending: Vascular Surgery | Admitting: Vascular Surgery

## 2013-08-23 ENCOUNTER — Encounter (HOSPITAL_COMMUNITY)
Admission: RE | Admit: 2013-08-23 | Discharge: 2013-08-23 | Disposition: A | Discharge status: Home / Self Care | Payer: Medicare Other | Source: Ambulatory Visit | Attending: Anesthesiology | Admitting: Anesthesiology

## 2013-08-23 DIAGNOSIS — Z01818 Encounter for other preprocedural examination: Secondary | ICD-10-CM | POA: Diagnosis not present

## 2013-08-23 DIAGNOSIS — Z7982 Long term (current) use of aspirin: Secondary | ICD-10-CM | POA: Diagnosis not present

## 2013-08-23 DIAGNOSIS — N184 Chronic kidney disease, stage 4 (severe): Secondary | ICD-10-CM | POA: Diagnosis not present

## 2013-08-23 DIAGNOSIS — E119 Type 2 diabetes mellitus without complications: Secondary | ICD-10-CM | POA: Diagnosis not present

## 2013-08-23 DIAGNOSIS — Z85038 Personal history of other malignant neoplasm of large intestine: Secondary | ICD-10-CM | POA: Diagnosis not present

## 2013-08-23 DIAGNOSIS — Z01812 Encounter for preprocedural laboratory examination: Secondary | ICD-10-CM | POA: Diagnosis not present

## 2013-08-23 DIAGNOSIS — Z8673 Personal history of transient ischemic attack (TIA), and cerebral infarction without residual deficits: Secondary | ICD-10-CM | POA: Diagnosis not present

## 2013-08-23 DIAGNOSIS — Z01810 Encounter for preprocedural cardiovascular examination: Secondary | ICD-10-CM | POA: Diagnosis not present

## 2013-08-23 DIAGNOSIS — I129 Hypertensive chronic kidney disease with stage 1 through stage 4 chronic kidney disease, or unspecified chronic kidney disease: Secondary | ICD-10-CM | POA: Diagnosis not present

## 2013-08-23 DIAGNOSIS — Z87891 Personal history of nicotine dependence: Secondary | ICD-10-CM | POA: Diagnosis not present

## 2013-08-23 MED ORDER — CEFUROXIME SODIUM 1.5 G IJ SOLR
1.5000 g | INTRAMUSCULAR | Status: AC
  Administered 2013-08-24: 1.5 g via INTRAVENOUS
  Filled 2013-08-23: qty 1.5

## 2013-08-23 NOTE — Progress Notes (Signed)
Anesthesia Chart Review:  Patient is a 80 year old female scheduled for creation of left brachiocephlic AVF on 7/41/63 by Dr. Oneida Alar. Nephrologist is Dr. Hinda Lenis.  She is not currently on HD.  History includes CKD, DM2, HTN, CVA '05, colon cancer s/p right hemicolectomy with resection of terminal ilium 06/2007, bladder cancer s/p fulguration of bladder tumor '10, anemia. PCP is listed as Dr. Rosita Fire.  EKG on 08/23/13 showed NSR, LAD, LVH, non-specific ST/T wave abnormality. Her last echo was on 05/28/07 and showed EF 60%, mild MR, trace TR. No chest pain symptoms documented at her PAT visit.  CXR on 08/23/13 showed no acute cardiopulmonary disease, stable cardiomegaly.  She is for ISTAT on the day of surgery.  Further evaluation by her assigned anesthesiologist on the day of surgery.  If no acute changes and labs are acceptable then I would anticipate that she could proceed as planned.  George Hugh [facility]/Anesthesiology Phone [phone] 08/23/2013 2:01 PM

## 2013-08-24 ENCOUNTER — Encounter (HOSPITAL_COMMUNITY): Payer: Self-pay | Admitting: *Deleted

## 2013-08-24 ENCOUNTER — Ambulatory Visit (HOSPITAL_COMMUNITY)
Admission: RE | Admit: 2013-08-24 | Discharge: 2013-08-24 | Disposition: A | Discharge status: Home / Self Care | Payer: Medicare Other | Source: Ambulatory Visit | Attending: Vascular Surgery | Admitting: Vascular Surgery

## 2013-08-24 ENCOUNTER — Encounter (HOSPITAL_COMMUNITY): Payer: Medicare Other | Admitting: Vascular Surgery

## 2013-08-24 ENCOUNTER — Ambulatory Visit (HOSPITAL_COMMUNITY): Payer: Medicare Other | Admitting: Anesthesiology

## 2013-08-24 ENCOUNTER — Encounter (HOSPITAL_COMMUNITY)
Admission: RE | Disposition: A | Discharge status: Home / Self Care | Payer: Self-pay | Source: Ambulatory Visit | Attending: Vascular Surgery

## 2013-08-24 ENCOUNTER — Telehealth: Payer: Self-pay | Admitting: Vascular Surgery

## 2013-08-24 DIAGNOSIS — N183 Chronic kidney disease, stage 3 unspecified: Secondary | ICD-10-CM | POA: Diagnosis not present

## 2013-08-24 DIAGNOSIS — N186 End stage renal disease: Secondary | ICD-10-CM

## 2013-08-24 DIAGNOSIS — I129 Hypertensive chronic kidney disease with stage 1 through stage 4 chronic kidney disease, or unspecified chronic kidney disease: Secondary | ICD-10-CM | POA: Diagnosis not present

## 2013-08-24 DIAGNOSIS — Z01818 Encounter for other preprocedural examination: Secondary | ICD-10-CM | POA: Insufficient documentation

## 2013-08-24 DIAGNOSIS — Z7982 Long term (current) use of aspirin: Secondary | ICD-10-CM | POA: Insufficient documentation

## 2013-08-24 DIAGNOSIS — Z01812 Encounter for preprocedural laboratory examination: Secondary | ICD-10-CM | POA: Insufficient documentation

## 2013-08-24 DIAGNOSIS — E119 Type 2 diabetes mellitus without complications: Secondary | ICD-10-CM | POA: Insufficient documentation

## 2013-08-24 DIAGNOSIS — Z87891 Personal history of nicotine dependence: Secondary | ICD-10-CM | POA: Insufficient documentation

## 2013-08-24 DIAGNOSIS — Z8673 Personal history of transient ischemic attack (TIA), and cerebral infarction without residual deficits: Secondary | ICD-10-CM | POA: Insufficient documentation

## 2013-08-24 DIAGNOSIS — N184 Chronic kidney disease, stage 4 (severe): Secondary | ICD-10-CM | POA: Diagnosis not present

## 2013-08-24 DIAGNOSIS — Z01810 Encounter for preprocedural cardiovascular examination: Secondary | ICD-10-CM | POA: Insufficient documentation

## 2013-08-24 DIAGNOSIS — Z85038 Personal history of other malignant neoplasm of large intestine: Secondary | ICD-10-CM | POA: Insufficient documentation

## 2013-08-24 DIAGNOSIS — D509 Iron deficiency anemia, unspecified: Secondary | ICD-10-CM | POA: Diagnosis not present

## 2013-08-24 HISTORY — PX: AV FISTULA PLACEMENT: SHX1204

## 2013-08-24 LAB — POCT I-STAT 4, (NA,K, GLUC, HGB,HCT)
Glucose, Bld: 114 mg/dL — ABNORMAL HIGH (ref 70–99)
HCT: 33 % — ABNORMAL LOW (ref 36.0–46.0)
Hemoglobin: 11.2 g/dL — ABNORMAL LOW (ref 12.0–15.0)
Potassium: 3.7 mEq/L (ref 3.7–5.3)
SODIUM: 146 meq/L (ref 137–147)

## 2013-08-24 LAB — GLUCOSE, CAPILLARY
Glucose-Capillary: 106 mg/dL — ABNORMAL HIGH (ref 70–99)
Glucose-Capillary: 88 mg/dL (ref 70–99)

## 2013-08-24 SURGERY — ARTERIOVENOUS (AV) FISTULA CREATION
Anesthesia: Monitor Anesthesia Care | Site: Arm Upper | Laterality: Left

## 2013-08-24 MED ORDER — MEPERIDINE HCL 25 MG/ML IJ SOLN: 6.2500 mg | INTRAMUSCULAR | Status: DC | PRN

## 2013-08-24 MED ORDER — HYDROMORPHONE HCL PF 1 MG/ML IJ SOLN: 0.2500 mg | INTRAMUSCULAR | Status: DC | PRN

## 2013-08-24 MED ORDER — LIDOCAINE-EPINEPHRINE (PF) 1 %-1:200000 IJ SOLN
INTRAMUSCULAR | Status: DC | PRN
  Administered 2013-08-24: 2.5 mL

## 2013-08-24 MED ORDER — OXYCODONE HCL 5 MG/5ML PO SOLN: 5.0000 mg | Freq: Once | ORAL | Status: DC | PRN

## 2013-08-24 MED ORDER — THROMBIN 20000 UNITS EX SOLR
CUTANEOUS | Status: AC
  Filled 2013-08-24: qty 20000

## 2013-08-24 MED ORDER — BUPIVACAINE HCL (PF) 0.5 % IJ SOLN
INTRAMUSCULAR | Status: DC | PRN
  Administered 2013-08-24: 2.5 mL

## 2013-08-24 MED ORDER — SODIUM CHLORIDE 0.9 % IR SOLN
Status: DC | PRN
  Administered 2013-08-24: 13:00:00

## 2013-08-24 MED ORDER — 0.9 % SODIUM CHLORIDE (POUR BTL) OPTIME
TOPICAL | Status: DC | PRN
  Administered 2013-08-24: 1000 mL

## 2013-08-24 MED ORDER — ONDANSETRON HCL 4 MG/2ML IJ SOLN: 4.0000 mg | Freq: Once | INTRAMUSCULAR | Status: DC | PRN

## 2013-08-24 MED ORDER — LIDOCAINE-EPINEPHRINE (PF) 1 %-1:200000 IJ SOLN
INTRAMUSCULAR | Status: AC
  Filled 2013-08-24: qty 10

## 2013-08-24 MED ORDER — SODIUM CHLORIDE 0.9 % IV SOLN
INTRAVENOUS | Status: DC
  Administered 2013-08-24: 09:00:00 via INTRAVENOUS

## 2013-08-24 MED ORDER — OXYCODONE-ACETAMINOPHEN 5-325 MG PO TABS: 1.0000 | ORAL_TABLET | Freq: Four times a day (QID) | ORAL | Status: DC | PRN

## 2013-08-24 MED ORDER — OXYCODONE HCL 5 MG PO TABS: 5.0000 mg | ORAL_TABLET | Freq: Once | ORAL | Status: DC | PRN

## 2013-08-24 MED ORDER — PROPOFOL INFUSION 10 MG/ML OPTIME
INTRAVENOUS | Status: DC | PRN
  Administered 2013-08-24: 100 ug/kg/min via INTRAVENOUS

## 2013-08-24 MED ORDER — FENTANYL CITRATE 0.05 MG/ML IJ SOLN
INTRAMUSCULAR | Status: DC | PRN
  Administered 2013-08-24: 50 ug via INTRAVENOUS

## 2013-08-24 MED ORDER — MIDAZOLAM HCL 5 MG/5ML IJ SOLN
INTRAMUSCULAR | Status: DC | PRN
  Administered 2013-08-24: 1 mg via INTRAVENOUS

## 2013-08-24 SURGICAL SUPPLY — 36 items
ARMBAND PINK RESTRICT EXTREMIT (MISCELLANEOUS) ×3 IMPLANT
CANISTER SUCTION 2500CC (MISCELLANEOUS) ×3 IMPLANT
CLIP TI MEDIUM 6 (CLIP) ×3 IMPLANT
CLIP TI WIDE RED SMALL 6 (CLIP) ×3 IMPLANT
COVER PROBE W GEL 5X96 (DRAPES) IMPLANT
COVER SURGICAL LIGHT HANDLE (MISCELLANEOUS) ×3 IMPLANT
DECANTER SPIKE VIAL GLASS SM (MISCELLANEOUS) ×3 IMPLANT
DERMABOND ADVANCED (GAUZE/BANDAGES/DRESSINGS) ×2
DERMABOND ADVANCED .7 DNX12 (GAUZE/BANDAGES/DRESSINGS) ×1 IMPLANT
DRAIN PENROSE 1/4X12 LTX STRL (WOUND CARE) ×3 IMPLANT
ELECT REM PT RETURN 9FT ADLT (ELECTROSURGICAL) ×3
ELECTRODE REM PT RTRN 9FT ADLT (ELECTROSURGICAL) ×1 IMPLANT
GEL ULTRASOUND 20GR AQUASONIC (MISCELLANEOUS) IMPLANT
GLOVE BIO SURGEON STRL SZ7.5 (GLOVE) ×3 IMPLANT
GLOVE BIOGEL PI IND STRL 7.0 (GLOVE) ×1 IMPLANT
GLOVE BIOGEL PI INDICATOR 7.0 (GLOVE) ×2
GLOVE ECLIPSE 6.5 STRL STRAW (GLOVE) ×3 IMPLANT
GOWN PREVENTION PLUS XLARGE (GOWN DISPOSABLE) ×3 IMPLANT
GOWN STRL NON-REIN LRG LVL3 (GOWN DISPOSABLE) ×9 IMPLANT
KIT BASIN OR (CUSTOM PROCEDURE TRAY) ×3 IMPLANT
KIT ROOM TURNOVER OR (KITS) ×3 IMPLANT
LOOP VESSEL MINI RED (MISCELLANEOUS) IMPLANT
NEEDLE HYPO 25GX1X1/2 BEV (NEEDLE) ×3 IMPLANT
NS IRRIG 1000ML POUR BTL (IV SOLUTION) ×3 IMPLANT
PACK CV ACCESS (CUSTOM PROCEDURE TRAY) ×3 IMPLANT
PAD ARMBOARD 7.5X6 YLW CONV (MISCELLANEOUS) ×6 IMPLANT
SPONGE SURGIFOAM ABS GEL 100 (HEMOSTASIS) IMPLANT
SUT MNCRL AB 4-0 PS2 18 (SUTURE) ×3 IMPLANT
SUT PROLENE 7 0 BV 1 (SUTURE) ×3 IMPLANT
SUT VIC AB 3-0 SH 27 (SUTURE) ×2
SUT VIC AB 3-0 SH 27X BRD (SUTURE) ×1 IMPLANT
SUT VICRYL 4-0 PS2 18IN ABS (SUTURE) ×3 IMPLANT
TOWEL OR 17X24 6PK STRL BLUE (TOWEL DISPOSABLE) ×3 IMPLANT
TOWEL OR 17X26 10 PK STRL BLUE (TOWEL DISPOSABLE) ×3 IMPLANT
UNDERPAD 30X30 INCONTINENT (UNDERPADS AND DIAPERS) ×3 IMPLANT
WATER STERILE IRR 1000ML POUR (IV SOLUTION) ×3 IMPLANT

## 2013-08-24 NOTE — Preoperative (Signed)
Beta Blockers   Reason not to administer Beta Blockers:Not Applicable 

## 2013-08-24 NOTE — H&P (Signed)
Brief History and Physical  History of Present Illness  Destiny Boyd is a 80 y.o. female who presents with chief complaint: chronic kidney disease stage III-IV .  The patient presents today for L [facility] AVF placement.    Past Medical History  Diagnosis Date  . Colon cancer     cecum cancer  . Iron deficiency anemia   . Diabetes mellitus   . Leukopenia   . Gout   . Hypercholesteremia   . Stroke 2005  . Obesity   . 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 Iron deficiency 05/20/2012  . Chronic kidney disease   . Hypertension   . Shortness of breath     wheezest time    Past Surgical History  Procedure Laterality Date  . Cataract extraction, bilateral      lens implants  . Abdominal hysterectomy    . Colon surgery  2010  . Eye surgery      History   Social History  . Marital Status: Widowed    Spouse Name: N/A    Number of Children: N/A  . Years of Education: N/A   Occupational History  . Not on file.   Social History Main Topics  . Smoking status: Former Smoker -- 1.00 packs/day for 10 years    Types: Cigarettes  . Smokeless tobacco: Never Used  . Alcohol Use: No  . Drug Use: No  . Sexual Activity: No   Other Topics Concern  . Not on file   Social History Narrative  . No narrative on file    Family History  Problem Relation Age of Onset  . Diabetes Mother   . Hypertension Mother     No current facility-administered medications on file prior to encounter.   Current Outpatient Prescriptions on File Prior to Encounter  Medication Sig Dispense Refill  . amLODipine (NORVASC) 10 MG tablet Take 10 mg by mouth daily.      Marland Kitchen aspirin 81 MG tablet Take 81 mg by mouth daily.        Marland Kitchen atenolol (TENORMIN) 100 MG tablet Take 100 mg by mouth daily.        . calcitRIOL (ROCALTROL) 0.25 MCG capsule Take 0.25 mcg by mouth daily.       . Cholecalciferol (D3 HIGH POTENCY) 1000 UNITS capsule Take 1,000 Units by mouth daily.      . hydrALAZINE (APRESOLINE) 50 MG  tablet Take 50 mg by mouth daily.         No Known Allergies  Review of Systems: As listed above, otherwise negative.  Physical Examination  Filed Vitals:   08/24/13 0828 08/24/13 0835  BP:  199/72  Pulse: 69   Temp: 97.9 F (36.6 C)   TempSrc: Oral   Resp: 20   SpO2: 98%     General: A&O x 3, WDWN  Pulmonary: Sym exp, good air movt, CTAB, no rales, rhonchi, & wheezing  Cardiac: RRR, Nl S1, S2, no Murmurs, rubs or gallops  Gastrointestinal: soft, NTND, -G/R, - HSM, - masses, - CVAT B  Musculoskeletal: M/S 5/5 throughout , Extremities without ischemic changes   Laboratory See iStat  Medical Decision Making  Destiny Boyd is a 80 y.o. female who presents with: chronic kidney disease stage III-IV.   The patient is scheduled for: L BC AVF  Risk, benefits, and alternatives to access surgery were discussed.  The patient is aware the risks include but are not limited to: bleeding, infection, steal syndrome, nerve  damage, ischemic monomelic neuropathy, failure to mature, and need for additional procedures.  The patient is aware of the risks and agrees to proceed.  Adele Barthel, MD [facility] [place] Office: [phone] Pager: [phone]  08/24/2013, 12:02 PM

## 2013-08-24 NOTE — Anesthesia Preprocedure Evaluation (Signed)
Anesthesia Evaluation  Patient identified by MRN, date of birth, ID band Patient awake    Reviewed: Allergy & Precautions, H&P , NPO status , Patient's Chart, lab work & pertinent test results  Airway Mallampati: I TM Distance: >3 FB Neck ROM: Full    Dental   Pulmonary former smoker,          Cardiovascular hypertension, Pt. on medications     Neuro/Psych    GI/Hepatic   Endo/Other  diabetes, Type 2, Oral Hypoglycemic Agents  Renal/GU Dialysis and CRFRenal disease     Musculoskeletal   Abdominal   Peds  Hematology   Anesthesia Other Findings   Reproductive/Obstetrics                           Anesthesia Physical Anesthesia Plan  ASA: III  Anesthesia Plan: MAC   Post-op Pain Management:    Induction: Intravenous  Airway Management Planned:   Additional Equipment:   Intra-op Plan:   Post-operative Plan:   Informed Consent: I have reviewed the patients History and Physical, chart, labs and discussed the procedure including the risks, benefits and alternatives for the proposed anesthesia with the patient or authorized representative who has indicated his/her understanding and acceptance.     Plan Discussed with: CRNA and Surgeon  Anesthesia Plan Comments:         Anesthesia Quick Evaluation

## 2013-08-24 NOTE — Transfer of Care (Signed)
Immediate Anesthesia Transfer of Care Note  Patient: Destiny Boyd  Procedure(s) Performed: Procedure(s): ARTERIOVENOUS (AV) FISTULA CREATION- LEFT BRACHIAL CEPHALIC (Left)  Patient Location: PACU  Anesthesia Type:MAC  Level of Consciousness: awake, alert , oriented and patient cooperative  Airway & Oxygen Therapy: Patient Spontanous Breathing  Post-op Assessment: Report given to PACU RN, Post -op Vital signs reviewed and stable and Patient moving all extremities  Post vital signs: Reviewed and stable  Complications: No apparent anesthesia complications

## 2013-08-24 NOTE — Op Note (Signed)
OPERATIVE NOTE   PROCEDURE: left brachiocephalic arteriovenous fistula placement  PRE-OPERATIVE DIAGNOSIS: chronic kidney disease stage III-IV   POST-OPERATIVE DIAGNOSIS: same as above   SURGEON: Adele Barthel, MD  ASSISTANT(S): Gerri Lins, PAC   ANESTHESIA: local and MAC  ESTIMATED BLOOD LOSS: 50 cc  FINDING(S): 1. Palpable thrill at end of case 2. Dopplerable left radial signal at end of case  SPECIMEN(S):  none  INDICATIONS:   Destiny Boyd is a 80 y.o. female who presents with chronic kidney disease stage III-IV.  The patient is scheduled for left brachiocephalic arteriovenous fistula placement.  The patient is aware the risks include but are not limited to: bleeding, infection, steal syndrome, nerve damage, ischemic monomelic neuropathy, failure to mature, and need for additional procedures.  The patient is aware of the risks of the procedure and elects to proceed forward.  DESCRIPTION: After full informed written consent was obtained from the patient, the patient was brought back to the operating room and placed supine upon the operating table.  Prior to induction, the patient received IV antibiotics.   After obtaining adequate anesthesia, the patient was then prepped and draped in the standard fashion for a left arm access procedure.  I turned my attention first to identifying the patient's cephalic vein and brachial artery.  Using SonoSite guidance, the location of these vessels were marked out on the skin.   At this point, I injected local anesthetic to obtain a field block of the antecubitum.  In total, I injected about 5 mL of a 1:1 mixture of 0.5% Marcaine without epinephrine and 1% lidocaine with epinephrine.  I made a transverse incision at the level of the antecubitum and dissected through the subcutaneous tissue and fascia to gain exposure of the brachial artery.  This was noted to be 4 mm in diameter externally.  This was dissected out proximally and distally  and controlled with vessel loops .  I then dissected out the cephalic vein.  This was noted to be 4 mm in diameter externally.  The distal segment of the vein was ligated with a  2-0 silk, and the vein was transected.  The proximal segment was iinterrogated with serial dilators.  The vein accepted up to a 4 mm dilator without any difficulty.  I then instilled the heparinized saline into the vein and clamped it.  At this point, I reset my exposure of the brachial artery and placed the artery under tension proximally and distally.  I made an arteriotomy with a #11 blade, and then I extended the arteriotomy with a Potts scissor.  I injected heparinized saline proximal and distal to this arteriotomy.  The vein was then sewn to the artery in an end-to-side configuration with a running stitch of 7-0 Prolene.  Prior to completing this anastomosis, I allowed the vein and artery to backbleed.  There was no evidence of clot from any vessels.  I completed the anastomosis in the usual fashion and then released all vessel loops and clamps.  There was a palpable thrill in the venous outflow, and there was a dopplerable radial signal.  At this point, I irrigated out the surgical wound.  There was no further active bleeding.  The subcutaneous tissue was reapproximated with a running stitch of 3-0 Vicryl.  The skin was then reapproximated with a running subcuticular stitch of 4-0 Vicryl.  The skin was then cleaned, dried, and reinforced with Dermabond.  The patient tolerated this procedure well.   COMPLICATIONS: none  CONDITION: stable  Adele Barthel, MD [facility] Office: [phone] Pager: [phone]  08/24/2013, 1:26 PM

## 2013-08-24 NOTE — Telephone Encounter (Addendum)
Message copied by Gena Fray on Wed Aug 24, 2013  3:16 PM ------      Message from: Alfonso Patten      Created: Wed Aug 24, 2013  1:37 PM                   ----- Message -----         From: Conrad Benson, MD         Sent: 08/24/2013   1:28 PM           To: Patrici Ranks, Alfonso Patten, RN            Destiny Boyd      [id number]      10/28/1933            PROCEDURE:      left brachiocephalic arteriovenous fistula placement            Asst: Gerri Lins, [facility]             Follow-up: 6 weeks ------  08/24/13: lm for pt, dpm

## 2013-08-24 NOTE — Anesthesia Postprocedure Evaluation (Signed)
Anesthesia Post Note  Patient: Destiny Boyd  Procedure(s) Performed: Procedure(s) (LRB): ARTERIOVENOUS (AV) FISTULA CREATION- LEFT BRACHIAL CEPHALIC (Left)  Anesthesia type: general  Patient location: PACU  Post pain: Pain level controlled  Post assessment: Patient's Cardiovascular Status Stable  Last Vitals:  Filed Vitals:   08/24/13 1438  BP:   Pulse: 62  Temp:   Resp: 13    Post vital signs: Reviewed and stable  Level of consciousness: sedated  Complications: No apparent anesthesia complications

## 2013-08-24 NOTE — Anesthesia Procedure Notes (Signed)
Procedure Name: MAC Date/Time: 08/24/2013 12:22 PM Performed by: Julian Reil Pre-anesthesia Checklist: Patient identified, Emergency Drugs available, Suction available and Patient being monitored Patient Re-evaluated:Patient Re-evaluated prior to inductionIntubation Type: IV induction Placement Confirmation: positive ETCO2 and breath sounds checked- equal and bilateral

## 2013-08-29 ENCOUNTER — Encounter (HOSPITAL_COMMUNITY): Payer: Self-pay | Admitting: Vascular Surgery

## 2013-09-06 ENCOUNTER — Other Ambulatory Visit: Payer: Self-pay | Admitting: Vascular Surgery

## 2013-09-06 DIAGNOSIS — Z4931 Encounter for adequacy testing for hemodialysis: Secondary | ICD-10-CM

## 2013-09-06 DIAGNOSIS — N186 End stage renal disease: Secondary | ICD-10-CM

## 2013-09-08 DIAGNOSIS — D649 Anemia, unspecified: Secondary | ICD-10-CM | POA: Diagnosis not present

## 2013-09-08 DIAGNOSIS — I10 Essential (primary) hypertension: Secondary | ICD-10-CM | POA: Diagnosis not present

## 2013-09-08 DIAGNOSIS — N189 Chronic kidney disease, unspecified: Secondary | ICD-10-CM | POA: Diagnosis not present

## 2013-09-08 DIAGNOSIS — R809 Proteinuria, unspecified: Secondary | ICD-10-CM | POA: Diagnosis not present

## 2013-09-08 DIAGNOSIS — Z79899 Other long term (current) drug therapy: Secondary | ICD-10-CM | POA: Diagnosis not present

## 2013-09-08 DIAGNOSIS — E559 Vitamin D deficiency, unspecified: Secondary | ICD-10-CM | POA: Diagnosis not present

## 2013-09-13 DIAGNOSIS — E872 Acidosis, unspecified: Secondary | ICD-10-CM | POA: Diagnosis not present

## 2013-09-13 DIAGNOSIS — D649 Anemia, unspecified: Secondary | ICD-10-CM | POA: Diagnosis not present

## 2013-09-13 DIAGNOSIS — I10 Essential (primary) hypertension: Secondary | ICD-10-CM | POA: Diagnosis not present

## 2013-09-13 DIAGNOSIS — N185 Chronic kidney disease, stage 5: Secondary | ICD-10-CM | POA: Diagnosis not present

## 2013-09-22 DIAGNOSIS — H26499 Other secondary cataract, unspecified eye: Secondary | ICD-10-CM | POA: Diagnosis not present

## 2013-09-22 DIAGNOSIS — E119 Type 2 diabetes mellitus without complications: Secondary | ICD-10-CM | POA: Diagnosis not present

## 2013-09-22 DIAGNOSIS — H35379 Puckering of macula, unspecified eye: Secondary | ICD-10-CM | POA: Diagnosis not present

## 2013-10-06 ENCOUNTER — Encounter: Payer: Self-pay | Admitting: Vascular Surgery

## 2013-10-07 ENCOUNTER — Encounter: Payer: Medicare Other | Admitting: Vascular Surgery

## 2013-10-07 ENCOUNTER — Other Ambulatory Visit (HOSPITAL_COMMUNITY): Payer: Medicare Other

## 2013-10-11 DIAGNOSIS — I10 Essential (primary) hypertension: Secondary | ICD-10-CM | POA: Diagnosis not present

## 2013-10-11 DIAGNOSIS — E119 Type 2 diabetes mellitus without complications: Secondary | ICD-10-CM | POA: Diagnosis not present

## 2013-10-11 DIAGNOSIS — N19 Unspecified kidney failure: Secondary | ICD-10-CM | POA: Diagnosis not present

## 2013-10-20 ENCOUNTER — Encounter: Payer: Self-pay | Admitting: Vascular Surgery

## 2013-10-21 ENCOUNTER — Other Ambulatory Visit (HOSPITAL_COMMUNITY): Payer: Medicare Other

## 2013-10-21 ENCOUNTER — Encounter: Payer: Medicare Other | Admitting: Vascular Surgery

## 2013-10-25 ENCOUNTER — Ambulatory Visit (HOSPITAL_COMMUNITY)
Admission: RE | Admit: 2013-10-25 | Discharge: 2013-10-25 | Disposition: A | Discharge status: Home / Self Care | Payer: Medicare Other | Source: Ambulatory Visit | Attending: Internal Medicine | Admitting: Internal Medicine

## 2013-10-25 ENCOUNTER — Emergency Department (HOSPITAL_COMMUNITY): Payer: Medicare Other

## 2013-10-25 ENCOUNTER — Encounter (HOSPITAL_COMMUNITY): Payer: Self-pay | Admitting: Emergency Medicine

## 2013-10-25 ENCOUNTER — Emergency Department (HOSPITAL_COMMUNITY)
Admission: EM | Admit: 2013-10-25 | Discharge: 2013-10-25 | Disposition: A | Discharge status: Home / Self Care | Payer: Medicare Other | Attending: Emergency Medicine | Admitting: Emergency Medicine

## 2013-10-25 ENCOUNTER — Other Ambulatory Visit (HOSPITAL_COMMUNITY): Payer: Self-pay | Admitting: Internal Medicine

## 2013-10-25 DIAGNOSIS — R109 Unspecified abdominal pain: Secondary | ICD-10-CM | POA: Diagnosis not present

## 2013-10-25 DIAGNOSIS — Z87891 Personal history of nicotine dependence: Secondary | ICD-10-CM | POA: Diagnosis not present

## 2013-10-25 DIAGNOSIS — Z8673 Personal history of transient ischemic attack (TIA), and cerebral infarction without residual deficits: Secondary | ICD-10-CM | POA: Diagnosis not present

## 2013-10-25 DIAGNOSIS — Z7982 Long term (current) use of aspirin: Secondary | ICD-10-CM | POA: Diagnosis not present

## 2013-10-25 DIAGNOSIS — N189 Chronic kidney disease, unspecified: Secondary | ICD-10-CM | POA: Insufficient documentation

## 2013-10-25 DIAGNOSIS — K8020 Calculus of gallbladder without cholecystitis without obstruction: Secondary | ICD-10-CM | POA: Insufficient documentation

## 2013-10-25 DIAGNOSIS — E119 Type 2 diabetes mellitus without complications: Secondary | ICD-10-CM | POA: Diagnosis not present

## 2013-10-25 DIAGNOSIS — Z79899 Other long term (current) drug therapy: Secondary | ICD-10-CM | POA: Insufficient documentation

## 2013-10-25 DIAGNOSIS — Z85038 Personal history of other malignant neoplasm of large intestine: Secondary | ICD-10-CM | POA: Insufficient documentation

## 2013-10-25 DIAGNOSIS — I10 Essential (primary) hypertension: Secondary | ICD-10-CM | POA: Diagnosis not present

## 2013-10-25 DIAGNOSIS — Z9889 Other specified postprocedural states: Secondary | ICD-10-CM | POA: Insufficient documentation

## 2013-10-25 DIAGNOSIS — N281 Cyst of kidney, acquired: Secondary | ICD-10-CM | POA: Diagnosis not present

## 2013-10-25 DIAGNOSIS — E669 Obesity, unspecified: Secondary | ICD-10-CM | POA: Insufficient documentation

## 2013-10-25 DIAGNOSIS — N039 Chronic nephritic syndrome with unspecified morphologic changes: Secondary | ICD-10-CM

## 2013-10-25 DIAGNOSIS — R0602 Shortness of breath: Secondary | ICD-10-CM | POA: Diagnosis not present

## 2013-10-25 DIAGNOSIS — D631 Anemia in chronic kidney disease: Secondary | ICD-10-CM | POA: Insufficient documentation

## 2013-10-25 DIAGNOSIS — R1033 Periumbilical pain: Secondary | ICD-10-CM | POA: Diagnosis not present

## 2013-10-25 DIAGNOSIS — I129 Hypertensive chronic kidney disease with stage 1 through stage 4 chronic kidney disease, or unspecified chronic kidney disease: Secondary | ICD-10-CM | POA: Insufficient documentation

## 2013-10-25 DIAGNOSIS — R197 Diarrhea, unspecified: Secondary | ICD-10-CM | POA: Diagnosis not present

## 2013-10-25 DIAGNOSIS — Z90710 Acquired absence of both cervix and uterus: Secondary | ICD-10-CM | POA: Insufficient documentation

## 2013-10-25 LAB — CBC WITH DIFFERENTIAL/PLATELET
BASOS PCT: 0 % (ref 0–1)
Basophils Absolute: 0 10*3/uL (ref 0.0–0.1)
EOS ABS: 0.1 10*3/uL (ref 0.0–0.7)
EOS PCT: 1 % (ref 0–5)
HCT: 27.4 % — ABNORMAL LOW (ref 36.0–46.0)
Hemoglobin: 8.6 g/dL — ABNORMAL LOW (ref 12.0–15.0)
Lymphocytes Relative: 18 % (ref 12–46)
Lymphs Abs: 1 10*3/uL (ref 0.7–4.0)
MCH: 24.8 pg — ABNORMAL LOW (ref 26.0–34.0)
MCHC: 31.4 g/dL (ref 30.0–36.0)
MCV: 79 fL (ref 78.0–100.0)
Monocytes Absolute: 0.4 10*3/uL (ref 0.1–1.0)
Monocytes Relative: 6 % (ref 3–12)
NEUTROS PCT: 74 % (ref 43–77)
Neutro Abs: 4.3 10*3/uL (ref 1.7–7.7)
Platelets: 221 10*3/uL (ref 150–400)
RBC: 3.47 MIL/uL — ABNORMAL LOW (ref 3.87–5.11)
RDW: 16.5 % — AB (ref 11.5–15.5)
WBC: 5.8 10*3/uL (ref 4.0–10.5)

## 2013-10-25 LAB — COMPREHENSIVE METABOLIC PANEL
ALBUMIN: 3.6 g/dL (ref 3.5–5.2)
ALT: 37 U/L — AB (ref 0–35)
AST: 31 U/L (ref 0–37)
Alkaline Phosphatase: 129 U/L — ABNORMAL HIGH (ref 39–117)
BUN: 43 mg/dL — ABNORMAL HIGH (ref 6–23)
CO2: 18 mEq/L — ABNORMAL LOW (ref 19–32)
Calcium: 8.9 mg/dL (ref 8.4–10.5)
Chloride: 111 mEq/L (ref 96–112)
Creatinine, Ser: 3.7 mg/dL — ABNORMAL HIGH (ref 0.50–1.10)
GFR calc Af Amer: 13 mL/min — ABNORMAL LOW (ref >90)
GFR calc non Af Amer: 11 mL/min — ABNORMAL LOW (ref >90)
Glucose, Bld: 146 mg/dL — ABNORMAL HIGH (ref 70–99)
Potassium: 3.7 mEq/L (ref 3.7–5.3)
Sodium: 147 mEq/L (ref 137–147)
TOTAL PROTEIN: 7.6 g/dL (ref 6.0–8.3)
Total Bilirubin: 0.6 mg/dL (ref 0.3–1.2)

## 2013-10-25 LAB — LIPASE, BLOOD: LIPASE: 23 U/L (ref 11–59)

## 2013-10-25 LAB — TYPE AND SCREEN
ABO/RH(D): B POS
ANTIBODY SCREEN: NEGATIVE

## 2013-10-25 LAB — POC OCCULT BLOOD, ED: Fecal Occult Bld: NEGATIVE

## 2013-10-25 LAB — LACTIC ACID, PLASMA: LACTIC ACID, VENOUS: 0.9 mmol/L (ref 0.5–2.2)

## 2013-10-25 LAB — PROTIME-INR
INR: 1.13 (ref 0.00–1.49)
Prothrombin Time: 14.3 seconds (ref 11.6–15.2)

## 2013-10-25 MED ORDER — TRAMADOL HCL 50 MG PO TABS: 50.0000 mg | ORAL_TABLET | Freq: Four times a day (QID) | ORAL | Status: DC | PRN

## 2013-10-25 MED ORDER — RANITIDINE HCL 150 MG PO TABS: 150.0000 mg | ORAL_TABLET | Freq: Two times a day (BID) | ORAL | Status: DC

## 2013-10-25 MED ORDER — SODIUM CHLORIDE 0.9 % IV SOLN
INTRAVENOUS | Status: DC
  Administered 2013-10-25: 14:00:00 via INTRAVENOUS

## 2013-10-25 MED ORDER — SUCRALFATE 1 GM/10ML PO SUSP: 1.0000 g | Freq: Three times a day (TID) | ORAL | Status: DC

## 2013-10-25 MED ORDER — IOHEXOL 300 MG/ML  SOLN
50.0000 mL | Freq: Once | INTRAMUSCULAR | Status: AC | PRN
Start: 2013-10-25 — End: 2013-10-25
  Administered 2013-10-25: 50 mL via ORAL

## 2013-10-25 NOTE — ED Provider Notes (Signed)
CSN: [id number]     Arrival date & time 10/25/13  1118 History  This chart was scribed for Orpah Greek, MD by Roxan Diesel, ED scribe.  This patient was seen in room APA08/APA08 and the patient's care was started at 1:18 PM.   Chief Complaint  Patient presents with  . GI Bleeding    The history is provided by the patient. No language interpreter was used.    HPI Comments: Destiny Boyd is a 80 y.o. female who presents to the Emergency Department complaining of intermittent sharp central abdominal pain that began 2 weeks ago.  Pt reports her pain generally occurs in episodes but has been constant since last night.  She reports she has also noticed dark stools whenever she moves her bowels over the past several days.  She denies vomiting.  Today she also became SOB.  She denies CP, cough, or fever.   Past Medical History  Diagnosis Date  . Colon cancer     cecum cancer  . Iron deficiency anemia   . Diabetes mellitus   . Leukopenia   . Gout   . Hypercholesteremia   . Stroke 2005  . Obesity   . 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 Iron deficiency 05/20/2012  . Chronic kidney disease   . Hypertension   . Shortness of breath     wheezest time    Past Surgical History  Procedure Laterality Date  . Cataract extraction, bilateral      lens implants  . Abdominal hysterectomy    . Colon surgery  2010  . Eye surgery    . Av fistula placement Left 08/24/2013    Procedure: ARTERIOVENOUS (AV) FISTULA CREATION- LEFT BRACHIAL CEPHALIC;  Surgeon: Conrad Gem Lake, MD;  Location: [facility] OR;  Service: Vascular;  Laterality: Left;    Family History  Problem Relation Age of Onset  . Diabetes Mother   . Hypertension Mother     History  Substance Use Topics  . Smoking status: Former Smoker -- 1.00 packs/day for 10 years    Types: Cigarettes  . Smokeless tobacco: Never Used  . Alcohol Use: No    OB History   Grav Para Term Preterm Abortions TAB SAB Ect Mult Living                    Review of Systems  Constitutional: Negative for fever.  Respiratory: Positive for shortness of breath. Negative for cough.   Cardiovascular: Negative for chest pain.  Gastrointestinal: Positive for abdominal pain and blood in stool (black stools). Negative for vomiting.  All other systems reviewed and are negative.      Allergies  Review of patient's allergies indicates no known allergies.  Home Medications   Current Outpatient Rx  Name  Route  Sig  Dispense  Refill  . amLODipine (NORVASC) 10 MG tablet   Oral   Take 10 mg by mouth daily.         Marland Kitchen aspirin EC 81 MG tablet   Oral   Take 81 mg by mouth daily.         Marland Kitchen atenolol (TENORMIN) 100 MG tablet   Oral   Take 100 mg by mouth daily.           . calcitRIOL (ROCALTROL) 0.25 MCG capsule   Oral   Take 0.25 mcg by mouth daily.          . Cholecalciferol (D3 HIGH POTENCY) 1000 UNITS capsule   Oral  Take 1,000 Units by mouth daily.         . hydrALAZINE (APRESOLINE) 50 MG tablet   Oral   Take 50 mg by mouth 2 (two) times daily.          Marland Kitchen linagliptin (TRADJENTA) 5 MG TABS tablet   Oral   Take 5 mg by mouth daily.          BP 171/84  Pulse 68  Temp(Src) 98.2 F (36.8 C) (Oral)  Resp 20  Ht 5\' 5"  (1.651 m)  Wt 151 lb (68.493 kg)  BMI 25.13 kg/m2  SpO2 96%  Physical Exam  Nursing note and vitals reviewed. Constitutional: She is oriented to person, place, and time. She appears well-developed and well-nourished. No distress.  HENT:  Head: Normocephalic and atraumatic.  Right Ear: Hearing normal.  Left Ear: Hearing normal.  Nose: Nose normal.  Mouth/Throat: Oropharynx is clear and moist and mucous membranes are normal.  Eyes: Conjunctivae and EOM are normal. Pupils are equal, round, and reactive to light.  Neck: Normal range of motion. Neck supple.  Cardiovascular: Regular rhythm, S1 normal and S2 normal.  Exam reveals no gallop and no friction rub.   No murmur  heard. Pulmonary/Chest: Effort normal and breath sounds normal. No respiratory distress. She exhibits no tenderness.  Abdominal: Soft. Normal appearance and bowel sounds are normal. There is no hepatosplenomegaly. There is tenderness (periumbilical and diffusely in lower abdomen). There is no rebound, no guarding, no tenderness at McBurney's point and negative Murphy's sign. No hernia.  Musculoskeletal: Normal range of motion.  Neurological: She is alert and oriented to person, place, and time. She has normal strength. No cranial nerve deficit or sensory deficit. Coordination normal. GCS eye subscore is 4. GCS verbal subscore is 5. GCS motor subscore is 6.  Skin: Skin is warm, dry and intact. No rash noted. No cyanosis.  Psychiatric: She has a normal mood and affect. Her speech is normal and behavior is normal. Thought content normal.    ED Course  Procedures (including critical care time)  DIAGNOSTIC STUDIES: Oxygen Saturation is 96% on room air, normal by my interpretation.    COORDINATION OF CARE: 1:21 PM-Discussed treatment plan which includes labs with pt at bedside and pt agreed to plan.     Labs Review Labs Reviewed  CBC WITH DIFFERENTIAL - Abnormal; Notable for the following:    RBC 3.47 (*)    Hemoglobin 8.6 (*)    HCT 27.4 (*)    MCH 24.8 (*)    RDW 16.5 (*)    All other components within normal limits  COMPREHENSIVE METABOLIC PANEL - Abnormal; Notable for the following:    CO2 18 (*)    Glucose, Bld 146 (*)    BUN 43 (*)    Creatinine, Ser 3.70 (*)    ALT 37 (*)    Alkaline Phosphatase 129 (*)    GFR calc non Af Amer 11 (*)    GFR calc Af Amer 13 (*)    All other components within normal limits  PROTIME-INR  LACTIC ACID, PLASMA  LIPASE, BLOOD  URINALYSIS, ROUTINE W REFLEX MICROSCOPIC  POC OCCULT BLOOD, ED  TYPE AND SCREEN    Imaging Review Ct Abdomen Pelvis Wo Contrast  10/25/2013   CLINICAL DATA Abdominal pain for several days, history diabetes, chronic  kidney disease, hypertension, colon cancer, stroke  EXAM CT ABDOMEN AND PELVIS WITHOUT CONTRAST  TECHNIQUE Multidetector CT imaging of the abdomen and pelvis was performed following the  standard protocol without intravenous contrast. Sagittal and coronal MPR images reconstructed from axial data set.  COMPARISON 01/14/2011  FINDINGS Enlargement of cardiac chambers.  Atherosclerotic calcifications aorta and coronary arteries.  Small bibasilar pleural effusions and atelectasis.  Low-attenuation lesion at upper pole of right kidney likely cyst 3.4 x 3.1 cm, minimally increased.  Question additional cyst at medial aspect of mid to inferior right kidney 13 x 16 mm image 36.  Within limits of a nonenhanced exam, remainder of liver, spleen, pancreas, kidneys, and adrenal glands unremarkable.  Minimal pericardial fluid.  Small periumbilical hernia on right containing fat.  Unremarkable bladder.  Uterus surgically absent with nonvisualization of ovaries.  Question prior right colon resection.  Stomach and bowel loops otherwise unremarkable.  No additional mass, adenopathy, free fluid, or inflammatory process.  No acute osseous findings.  IMPRESSION Right renal cysts, stable to minimally increased in size.  Enlargement of cardiac silhouette with minimal pericardial effusion and extensive atherosclerotic disease changes including coronary arteries.  Small periumbilical hernia containing fat. No new intra-abdominal or intrapelvic abnormalities identified.  SIGNATURE  Electronically Signed   By: Lavonia Dana M.D.   On: 10/25/2013 15:54   US Abdomen Complete  10/25/2013   CLINICAL DATA:  Abdominal pain  EXAM: ULTRASOUND ABDOMEN COMPLETE  COMPARISON:  US RENAL dated 06/21/2012; CT ABD/PELVIS W CM dated 01/14/2011; CT ABDOMEN W/O CM dated 12/04/2008  FINDINGS: Gallbladder:  The gallbladder is not well demonstrated and is likely contracted demonstrating a wall echo shadow contour. The gallbladder wall is difficult to measure  accurately. No pericholecystic fluid is demonstrated. There is no positive sonographic Murphy's sign. A CT scan from May of 2012 reveals a contracted stone-filled gallbladder.  Common bile duct:  Diameter: 4.3 mm  Liver:  No focal lesion identified. Within normal limits in parenchymal echogenicity.  IVC:  Mild prominence of the IVC without abnormal intraluminal echoes.  Pancreas:  Visualized portion unremarkable.  Spleen:  Size and appearance within normal limits.  Right Kidney:  Length: 9.5 cm the echotexture of the renal parenchyma on the right is increased and greater than that of the adjacent liver. There are multiple cysts. In the upper pole 83.8 cm diameter cyst is demonstrated. In the midpole a 2.1 cm diameter cyst is demonstrated. In the lower pole a 0.9 cm diameter cyst is demonstrated. These have been previously described. No hydronephrosis visualized.  Left Kidney:  Length: 8.4 cm. The renal cortical echotexture is diffusely increased. No mass or hydronephrosis is demonstrated.  Abdominal aorta:  No aneurysm visualized.  Other findings:  Mild prominence of the hepatic veins and of the inferior vena cava may reflect increased right heart pressures.  IMPRESSION: 1. The gallbladder appears contracted and contains multiple calcified stones. This appearance is similar to that on previous CT scans. There is no sonographic evidence of acute cholecystitis. 2. The liver and pancreas and spleen exhibit no acute abnormalities. 3. Increased echotexture both kidneys is consistent with medical renal disease. Stable simple cysts on the right are present. 4. There is prominence of the hepatic veins and inferior vena cava which may reflect increased right heart pressure.   Electronically Signed   By: David  Martinique   On: 10/25/2013 10:47     EKG Interpretation None      MDM   Final diagnoses:  None   Patient presents to the ER for evaluation of abdominal pain. She has been experiencing episodic pain in her  upper abdomen, especially at night. She has been having  diarrhea and her stool has been "dark". She has not noticed any blood.  Examination was fairly unremarkable. Patient has chronic renal insufficiency, BUN/creatinine are at her baseline. She also has chronic anemia, hemoglobin is slightly below her normal level of approximately 9, today was 8.6. A rectal exam, however, was negative for blood.  CT scan was performed. No acute pathology seen. She does have gallstones. This might be the cause of the intermittent upper abdominal pain. At this point she is stable. She feels much better after IV fluids. There is no further pain. Weakness and dizziness she was experiencing have resolved. She is ambulatory here in the ER. She will therefore be discharged, followup with Doctor Pilar Plate in the office for repeat CBC to ensure that she has not exhibited in previously worsening anemia. Consideration for possible evaluation by general surgery for gallstones can be performed as an outpatient as well. Will treat with Ultram, Ranitidine, Carafate.    I personally performed the services described in this documentation, which was scribed in my presence. The recorded information has been reviewed and is accurate.    Orpah Greek, MD 10/26/13 1534

## 2013-10-25 NOTE — ED Notes (Signed)
hemoccult card negative

## 2013-10-25 NOTE — Discharge Instructions (Signed)
Abdominal Pain, Adult °Many things can cause abdominal pain. Usually, abdominal pain is not caused by a disease and will improve without treatment. It can often be observed and treated at home. Your health care provider will do a physical exam and possibly order blood tests and X-rays to help determine the seriousness of your pain. However, in many cases, more time must pass before a clear cause of the pain can be found. Before that point, your health care provider may not know if you need more testing or further treatment. °HOME CARE INSTRUCTIONS  °Monitor your abdominal pain for any changes. The following actions may help to alleviate any discomfort you are experiencing: °· Only take over-the-counter or prescription medicines as directed by your health care provider. °· Do not take laxatives unless directed to do so by your health care provider. °· Try a clear liquid diet (broth, tea, or water) as directed by your health care provider. Slowly move to a bland diet as tolerated. °SEEK MEDICAL CARE IF: °· You have unexplained abdominal pain. °· You have abdominal pain associated with nausea or diarrhea. °· You have pain when you urinate or have a bowel movement. °· You experience abdominal pain that wakes you in the night. °· You have abdominal pain that is worsened or improved by eating food. °· You have abdominal pain that is worsened with eating fatty foods. °SEEK IMMEDIATE MEDICAL CARE IF:  °· Your pain does not go away within 2 hours. °· You have a fever. °· You keep throwing up (vomiting). °· Your pain is felt only in portions of the abdomen, such as the right side or the left lower portion of the abdomen. °· You pass bloody or black tarry stools. °MAKE SURE YOU: °· Understand these instructions.   °· Will watch your condition.   °· Will get help right away if you are not doing well or get worse.   °Document Released: 05/14/2005 Document Revised: 05/25/2013 Document Reviewed: 04/13/2013 °ExitCare® Patient  Information ©2014 [employer]. °Cholelithiasis °Cholelithiasis (also called gallstones) is a form of gallbladder disease in which gallstones form in your gallbladder. The gallbladder is an organ that stores bile made in the liver, which helps digest fats. Gallstones begin as small crystals and slowly grow into stones. Gallstone pain occurs when the gallbladder spasms and a gallstone is blocking the duct. Pain can also occur when a stone passes out of the duct.  °RISK FACTORS °· Being female.   °· Having multiple pregnancies. Health care providers sometimes advise removing diseased gallbladders before future pregnancies.   °· Being obese. °· Eating a diet heavy in fried foods and fat.   °· Being older than 60 years and increasing age.   °· Prolonged use of medicines containing female hormones.   °· Having diabetes mellitus.   °· Rapidly losing weight.   °· Having a family history of gallstones (heredity).   °SYMPTOMS °· Nausea.   °· Vomiting. °· Abdominal pain.   °· Yellowing of the skin (jaundice).   °· Sudden pain. It may persist from several minutes to several hours. °· Fever.   °· Tenderness to the touch.  °In some cases, when gallstones do not move into the bile duct, people have no pain or symptoms. These are called "silent" gallstones.  °TREATMENT °Silent gallstones do not need treatment. In severe cases, emergency surgery may be required. Options for treatment include: °· Surgery to remove the gallbladder. This is the most common treatment. °· Medicines. These do not always work and may take 6 12 months or more to work. °· Shock wave treatment (  extracorporeal biliary lithotripsy). In this treatment an ultrasound machine sends shock waves to the gallbladder to break gallstones into smaller pieces that can pass into the intestines or be dissolved by medicine. °HOME CARE INSTRUCTIONS  °· Only take over-the-counter or prescription medicines for pain, discomfort, or fever as directed by your health care  provider.   °· Follow a low-fat diet until seen again by your health care provider. Fat causes the gallbladder to contract, which can result in pain.   °· Follow up with your health care provider as directed. Attacks are almost always recurrent and surgery is usually required for permanent treatment.   °SEEK IMMEDIATE MEDICAL CARE IF:  °· Your pain increases and is not controlled by medicines.   °· You have a fever or persistent symptoms for more than 2 3 days.   °· You have a fever and your symptoms suddenly get worse.   °· You have persistent nausea and vomiting.   °MAKE SURE YOU:  °· Understand these instructions. °· Will watch your condition. °· Will get help right away if you are not doing well or get worse. °Document Released: 07/31/2005 Document Revised: 04/06/2013 Document Reviewed: 01/26/2013 °ExitCare® Patient Information ©2014 [employer]. ° °

## 2013-10-25 NOTE — ED Notes (Signed)
Pt sent to er from Dr. Legrand Rams office for further evaluation of abd cramping, diarrhea, pt has been seeing Dr Legrand Rams for symptoms, had blood work, ultrasound performed today with no results, Dr Legrand Rams concerned with possible GI bleed. Pt denies any bleeding in diarrhea but states that her stools have gotten darker "lately"

## 2013-10-25 NOTE — ED Notes (Signed)
When pt came into triage, pt sat down in seat, advised pt that I would be taking her blood pressure, pt held out left arm, blood pressure cuff applied and started, family then advises that pt has shunt in left arm, blood pressure cuff removed from left arm before blood pressure was finished taking, pt states "I did not think about y shunt when you told me you were going to take a blood pressure, advised pt to always speak up and let any staff member now about any restrictions, , restricted arm band applied,

## 2013-10-27 ENCOUNTER — Encounter: Payer: Self-pay | Admitting: Vascular Surgery

## 2013-10-28 ENCOUNTER — Ambulatory Visit (INDEPENDENT_AMBULATORY_CARE_PROVIDER_SITE_OTHER): Payer: Self-pay | Admitting: Vascular Surgery

## 2013-10-28 ENCOUNTER — Ambulatory Visit (HOSPITAL_COMMUNITY)
Admission: RE | Admit: 2013-10-28 | Discharge: 2013-10-28 | Disposition: A | Discharge status: Home / Self Care | Payer: Medicare Other | Source: Ambulatory Visit | Attending: Vascular Surgery | Admitting: Vascular Surgery

## 2013-10-28 ENCOUNTER — Encounter: Payer: Self-pay | Admitting: Vascular Surgery

## 2013-10-28 VITALS — BP 175/77 | HR 60 | Ht 65.0 in | Wt 157.4 lb

## 2013-10-28 DIAGNOSIS — N186 End stage renal disease: Secondary | ICD-10-CM | POA: Diagnosis not present

## 2013-10-28 DIAGNOSIS — Z4931 Encounter for adequacy testing for hemodialysis: Secondary | ICD-10-CM | POA: Insufficient documentation

## 2013-10-28 DIAGNOSIS — N184 Chronic kidney disease, stage 4 (severe): Secondary | ICD-10-CM

## 2013-10-28 NOTE — Progress Notes (Signed)
    Postoperative Access Visit   History of Present Illness  Destiny Boyd is a 80 y.o. year old female who presents for postoperative follow-up for: L BC AVF (Date: 08/24/13).  The patient's wounds are healed.  The patient notes no steal symptoms.  The patient is able to complete their activities of daily living.  The patient's current symptoms are: none.  For VQI Use Only  PRE-ADM LIVING: Home  AMB STATUS: Ambulatory  Physical Examination Filed Vitals:   10/28/13 1133  BP: 175/77  Pulse: 60    LUE: Incision is healed, skin feels warm, hand grip is 5/5, sensation in digits is intact, palpable thrill, bruit can  be auscultated   L access duplex (10/28/2013)  Diameters: 7.1-8.8 mm  Depth: 2.9-7.2 mm  Retained valve leaflets near anastomosis  Medical Decision Making  Destiny Boyd is a 80 y.o. year old female who presents s/p L BC AVF.  The patient's access is ready for use.  Thank you for allowing [place] to participate in this patient's care.  Adele Barthel, MD [facility] [place] Office: [phone] Pager: [phone]  10/28/2013, 12:06 PM

## 2013-11-01 ENCOUNTER — Other Ambulatory Visit (HOSPITAL_COMMUNITY): Payer: Medicare Other

## 2013-11-03 ENCOUNTER — Encounter (HOSPITAL_COMMUNITY): Payer: Medicare Other

## 2013-11-04 ENCOUNTER — Encounter (HOSPITAL_COMMUNITY): Payer: Self-pay

## 2013-11-15 DIAGNOSIS — R809 Proteinuria, unspecified: Secondary | ICD-10-CM | POA: Diagnosis not present

## 2013-11-15 DIAGNOSIS — E872 Acidosis, unspecified: Secondary | ICD-10-CM | POA: Diagnosis not present

## 2013-11-15 DIAGNOSIS — D509 Iron deficiency anemia, unspecified: Secondary | ICD-10-CM | POA: Diagnosis not present

## 2013-11-15 DIAGNOSIS — R609 Edema, unspecified: Secondary | ICD-10-CM | POA: Diagnosis not present

## 2013-11-15 DIAGNOSIS — I10 Essential (primary) hypertension: Secondary | ICD-10-CM | POA: Diagnosis not present

## 2013-11-15 DIAGNOSIS — N184 Chronic kidney disease, stage 4 (severe): Secondary | ICD-10-CM | POA: Diagnosis not present

## 2013-11-15 DIAGNOSIS — N19 Unspecified kidney failure: Secondary | ICD-10-CM | POA: Diagnosis not present

## 2013-11-15 DIAGNOSIS — E1129 Type 2 diabetes mellitus with other diabetic kidney complication: Secondary | ICD-10-CM | POA: Diagnosis not present

## 2013-11-15 DIAGNOSIS — E119 Type 2 diabetes mellitus without complications: Secondary | ICD-10-CM | POA: Diagnosis not present

## 2014-01-05 DIAGNOSIS — R809 Proteinuria, unspecified: Secondary | ICD-10-CM | POA: Diagnosis not present

## 2014-01-05 DIAGNOSIS — N184 Chronic kidney disease, stage 4 (severe): Secondary | ICD-10-CM | POA: Diagnosis not present

## 2014-01-05 DIAGNOSIS — E1129 Type 2 diabetes mellitus with other diabetic kidney complication: Secondary | ICD-10-CM | POA: Diagnosis not present

## 2014-01-05 DIAGNOSIS — D649 Anemia, unspecified: Secondary | ICD-10-CM | POA: Diagnosis not present

## 2014-01-05 DIAGNOSIS — Z79899 Other long term (current) drug therapy: Secondary | ICD-10-CM | POA: Diagnosis not present

## 2014-01-10 DIAGNOSIS — N183 Chronic kidney disease, stage 3 unspecified: Secondary | ICD-10-CM | POA: Diagnosis not present

## 2014-01-10 DIAGNOSIS — N184 Chronic kidney disease, stage 4 (severe): Secondary | ICD-10-CM | POA: Diagnosis not present

## 2014-01-10 DIAGNOSIS — D631 Anemia in chronic kidney disease: Secondary | ICD-10-CM | POA: Diagnosis not present

## 2014-01-10 DIAGNOSIS — D649 Anemia, unspecified: Secondary | ICD-10-CM | POA: Diagnosis not present

## 2014-01-10 DIAGNOSIS — E872 Acidosis, unspecified: Secondary | ICD-10-CM | POA: Diagnosis not present

## 2014-01-10 DIAGNOSIS — E1129 Type 2 diabetes mellitus with other diabetic kidney complication: Secondary | ICD-10-CM | POA: Diagnosis not present

## 2014-01-10 DIAGNOSIS — Z79899 Other long term (current) drug therapy: Secondary | ICD-10-CM | POA: Diagnosis not present

## 2014-01-10 DIAGNOSIS — I10 Essential (primary) hypertension: Secondary | ICD-10-CM | POA: Diagnosis not present

## 2014-01-10 DIAGNOSIS — N039 Chronic nephritic syndrome with unspecified morphologic changes: Secondary | ICD-10-CM | POA: Diagnosis not present

## 2014-01-12 DIAGNOSIS — E119 Type 2 diabetes mellitus without complications: Secondary | ICD-10-CM | POA: Diagnosis not present

## 2014-01-12 DIAGNOSIS — D649 Anemia, unspecified: Secondary | ICD-10-CM | POA: Diagnosis not present

## 2014-01-12 DIAGNOSIS — I10 Essential (primary) hypertension: Secondary | ICD-10-CM | POA: Diagnosis not present

## 2014-01-12 DIAGNOSIS — N19 Unspecified kidney failure: Secondary | ICD-10-CM | POA: Diagnosis not present

## 2014-04-13 DIAGNOSIS — N184 Chronic kidney disease, stage 4 (severe): Secondary | ICD-10-CM | POA: Diagnosis not present

## 2014-04-13 DIAGNOSIS — D649 Anemia, unspecified: Secondary | ICD-10-CM | POA: Diagnosis not present

## 2014-04-13 DIAGNOSIS — IMO0001 Reserved for inherently not codable concepts without codable children: Secondary | ICD-10-CM | POA: Diagnosis not present

## 2014-04-13 DIAGNOSIS — Z23 Encounter for immunization: Secondary | ICD-10-CM | POA: Diagnosis not present

## 2014-04-13 DIAGNOSIS — I10 Essential (primary) hypertension: Secondary | ICD-10-CM | POA: Diagnosis not present

## 2014-07-11 DIAGNOSIS — N184 Chronic kidney disease, stage 4 (severe): Secondary | ICD-10-CM | POA: Diagnosis not present

## 2014-07-11 DIAGNOSIS — I10 Essential (primary) hypertension: Secondary | ICD-10-CM | POA: Diagnosis not present

## 2014-07-11 DIAGNOSIS — Z23 Encounter for immunization: Secondary | ICD-10-CM | POA: Diagnosis not present

## 2014-07-11 DIAGNOSIS — E118 Type 2 diabetes mellitus with unspecified complications: Secondary | ICD-10-CM | POA: Diagnosis not present

## 2014-07-11 DIAGNOSIS — E784 Other hyperlipidemia: Secondary | ICD-10-CM | POA: Diagnosis not present

## 2014-09-19 DIAGNOSIS — I10 Essential (primary) hypertension: Secondary | ICD-10-CM | POA: Diagnosis not present

## 2014-09-19 DIAGNOSIS — D649 Anemia, unspecified: Secondary | ICD-10-CM | POA: Diagnosis not present

## 2014-09-19 DIAGNOSIS — N184 Chronic kidney disease, stage 4 (severe): Secondary | ICD-10-CM | POA: Diagnosis not present

## 2014-09-19 DIAGNOSIS — E784 Other hyperlipidemia: Secondary | ICD-10-CM | POA: Diagnosis not present

## 2014-10-10 DIAGNOSIS — D649 Anemia, unspecified: Secondary | ICD-10-CM | POA: Diagnosis not present

## 2014-10-10 DIAGNOSIS — E118 Type 2 diabetes mellitus with unspecified complications: Secondary | ICD-10-CM | POA: Diagnosis not present

## 2014-10-10 DIAGNOSIS — N184 Chronic kidney disease, stage 4 (severe): Secondary | ICD-10-CM | POA: Diagnosis not present

## 2014-10-10 DIAGNOSIS — I10 Essential (primary) hypertension: Secondary | ICD-10-CM | POA: Diagnosis not present

## 2014-10-18 DIAGNOSIS — N184 Chronic kidney disease, stage 4 (severe): Secondary | ICD-10-CM | POA: Diagnosis not present

## 2014-10-18 DIAGNOSIS — D649 Anemia, unspecified: Secondary | ICD-10-CM | POA: Diagnosis not present

## 2014-10-18 DIAGNOSIS — I10 Essential (primary) hypertension: Secondary | ICD-10-CM | POA: Diagnosis not present

## 2014-10-18 DIAGNOSIS — E118 Type 2 diabetes mellitus with unspecified complications: Secondary | ICD-10-CM | POA: Diagnosis not present

## 2014-12-07 DIAGNOSIS — R0600 Dyspnea, unspecified: Secondary | ICD-10-CM | POA: Diagnosis not present

## 2014-12-07 DIAGNOSIS — I10 Essential (primary) hypertension: Secondary | ICD-10-CM | POA: Diagnosis not present

## 2014-12-07 DIAGNOSIS — N184 Chronic kidney disease, stage 4 (severe): Secondary | ICD-10-CM | POA: Diagnosis not present

## 2014-12-07 DIAGNOSIS — E118 Type 2 diabetes mellitus with unspecified complications: Secondary | ICD-10-CM | POA: Diagnosis not present

## 2014-12-07 DIAGNOSIS — Z79899 Other long term (current) drug therapy: Secondary | ICD-10-CM | POA: Diagnosis not present

## 2014-12-07 DIAGNOSIS — N183 Chronic kidney disease, stage 3 (moderate): Secondary | ICD-10-CM | POA: Diagnosis not present

## 2014-12-07 DIAGNOSIS — D631 Anemia in chronic kidney disease: Secondary | ICD-10-CM | POA: Diagnosis not present

## 2014-12-19 ENCOUNTER — Other Ambulatory Visit (HOSPITAL_COMMUNITY): Payer: Self-pay | Admitting: Internal Medicine

## 2014-12-19 ENCOUNTER — Ambulatory Visit (HOSPITAL_COMMUNITY)
Admission: RE | Admit: 2014-12-19 | Discharge: 2014-12-19 | Disposition: A | Discharge status: Home / Self Care | Payer: Medicare Other | Source: Ambulatory Visit | Attending: Internal Medicine | Admitting: Internal Medicine

## 2014-12-19 DIAGNOSIS — R0989 Other specified symptoms and signs involving the circulatory and respiratory systems: Secondary | ICD-10-CM | POA: Diagnosis not present

## 2014-12-19 DIAGNOSIS — R0602 Shortness of breath: Secondary | ICD-10-CM | POA: Diagnosis not present

## 2014-12-19 DIAGNOSIS — E118 Type 2 diabetes mellitus with unspecified complications: Secondary | ICD-10-CM | POA: Diagnosis not present

## 2014-12-19 DIAGNOSIS — N184 Chronic kidney disease, stage 4 (severe): Secondary | ICD-10-CM | POA: Diagnosis not present

## 2014-12-19 DIAGNOSIS — R05 Cough: Secondary | ICD-10-CM | POA: Insufficient documentation

## 2015-02-09 DIAGNOSIS — N183 Chronic kidney disease, stage 3 (moderate): Secondary | ICD-10-CM | POA: Diagnosis not present

## 2015-02-09 DIAGNOSIS — I10 Essential (primary) hypertension: Secondary | ICD-10-CM | POA: Diagnosis not present

## 2015-02-09 DIAGNOSIS — R809 Proteinuria, unspecified: Secondary | ICD-10-CM | POA: Diagnosis not present

## 2015-02-09 DIAGNOSIS — D509 Iron deficiency anemia, unspecified: Secondary | ICD-10-CM | POA: Diagnosis not present

## 2015-02-09 DIAGNOSIS — E559 Vitamin D deficiency, unspecified: Secondary | ICD-10-CM | POA: Diagnosis not present

## 2015-02-09 DIAGNOSIS — Z79899 Other long term (current) drug therapy: Secondary | ICD-10-CM | POA: Diagnosis not present

## 2015-02-12 DIAGNOSIS — Z111 Encounter for screening for respiratory tuberculosis: Secondary | ICD-10-CM | POA: Diagnosis not present

## 2015-02-12 DIAGNOSIS — D631 Anemia in chronic kidney disease: Secondary | ICD-10-CM | POA: Diagnosis not present

## 2015-02-12 DIAGNOSIS — N184 Chronic kidney disease, stage 4 (severe): Secondary | ICD-10-CM | POA: Diagnosis not present

## 2015-02-12 DIAGNOSIS — I10 Essential (primary) hypertension: Secondary | ICD-10-CM | POA: Diagnosis not present

## 2015-02-12 DIAGNOSIS — E1129 Type 2 diabetes mellitus with other diabetic kidney complication: Secondary | ICD-10-CM | POA: Diagnosis not present

## 2015-02-13 DIAGNOSIS — N186 End stage renal disease: Secondary | ICD-10-CM | POA: Diagnosis not present

## 2015-02-22 DIAGNOSIS — N186 End stage renal disease: Secondary | ICD-10-CM | POA: Diagnosis not present

## 2015-02-22 DIAGNOSIS — D631 Anemia in chronic kidney disease: Secondary | ICD-10-CM | POA: Diagnosis not present

## 2015-02-22 DIAGNOSIS — N2581 Secondary hyperparathyroidism of renal origin: Secondary | ICD-10-CM | POA: Diagnosis not present

## 2015-02-22 DIAGNOSIS — Z992 Dependence on renal dialysis: Secondary | ICD-10-CM | POA: Diagnosis not present

## 2015-02-22 DIAGNOSIS — D509 Iron deficiency anemia, unspecified: Secondary | ICD-10-CM | POA: Diagnosis not present

## 2015-02-22 DIAGNOSIS — E611 Iron deficiency: Secondary | ICD-10-CM | POA: Diagnosis not present

## 2015-02-24 DIAGNOSIS — N186 End stage renal disease: Secondary | ICD-10-CM | POA: Diagnosis not present

## 2015-02-24 DIAGNOSIS — Z992 Dependence on renal dialysis: Secondary | ICD-10-CM | POA: Diagnosis not present

## 2015-02-24 DIAGNOSIS — D509 Iron deficiency anemia, unspecified: Secondary | ICD-10-CM | POA: Diagnosis not present

## 2015-02-24 DIAGNOSIS — D631 Anemia in chronic kidney disease: Secondary | ICD-10-CM | POA: Diagnosis not present

## 2015-02-24 DIAGNOSIS — N2581 Secondary hyperparathyroidism of renal origin: Secondary | ICD-10-CM | POA: Diagnosis not present

## 2015-02-24 DIAGNOSIS — E611 Iron deficiency: Secondary | ICD-10-CM | POA: Diagnosis not present

## 2015-02-27 DIAGNOSIS — D509 Iron deficiency anemia, unspecified: Secondary | ICD-10-CM | POA: Diagnosis not present

## 2015-02-27 DIAGNOSIS — Z992 Dependence on renal dialysis: Secondary | ICD-10-CM | POA: Diagnosis not present

## 2015-02-27 DIAGNOSIS — N2581 Secondary hyperparathyroidism of renal origin: Secondary | ICD-10-CM | POA: Diagnosis not present

## 2015-02-27 DIAGNOSIS — D631 Anemia in chronic kidney disease: Secondary | ICD-10-CM | POA: Diagnosis not present

## 2015-02-27 DIAGNOSIS — N186 End stage renal disease: Secondary | ICD-10-CM | POA: Diagnosis not present

## 2015-02-27 DIAGNOSIS — E611 Iron deficiency: Secondary | ICD-10-CM | POA: Diagnosis not present

## 2015-03-01 DIAGNOSIS — N186 End stage renal disease: Secondary | ICD-10-CM | POA: Diagnosis not present

## 2015-03-01 DIAGNOSIS — N2581 Secondary hyperparathyroidism of renal origin: Secondary | ICD-10-CM | POA: Diagnosis not present

## 2015-03-01 DIAGNOSIS — E611 Iron deficiency: Secondary | ICD-10-CM | POA: Diagnosis not present

## 2015-03-01 DIAGNOSIS — Z992 Dependence on renal dialysis: Secondary | ICD-10-CM | POA: Diagnosis not present

## 2015-03-01 DIAGNOSIS — D509 Iron deficiency anemia, unspecified: Secondary | ICD-10-CM | POA: Diagnosis not present

## 2015-03-01 DIAGNOSIS — D631 Anemia in chronic kidney disease: Secondary | ICD-10-CM | POA: Diagnosis not present

## 2015-03-02 DIAGNOSIS — T82858A Stenosis of vascular prosthetic devices, implants and grafts, initial encounter: Secondary | ICD-10-CM | POA: Diagnosis not present

## 2015-03-02 DIAGNOSIS — N186 End stage renal disease: Secondary | ICD-10-CM | POA: Diagnosis not present

## 2015-03-02 DIAGNOSIS — Z992 Dependence on renal dialysis: Secondary | ICD-10-CM | POA: Diagnosis not present

## 2015-03-02 DIAGNOSIS — I871 Compression of vein: Secondary | ICD-10-CM | POA: Diagnosis not present

## 2015-03-03 DIAGNOSIS — D509 Iron deficiency anemia, unspecified: Secondary | ICD-10-CM | POA: Diagnosis not present

## 2015-03-03 DIAGNOSIS — N2581 Secondary hyperparathyroidism of renal origin: Secondary | ICD-10-CM | POA: Diagnosis not present

## 2015-03-03 DIAGNOSIS — Z992 Dependence on renal dialysis: Secondary | ICD-10-CM | POA: Diagnosis not present

## 2015-03-03 DIAGNOSIS — D631 Anemia in chronic kidney disease: Secondary | ICD-10-CM | POA: Diagnosis not present

## 2015-03-03 DIAGNOSIS — N186 End stage renal disease: Secondary | ICD-10-CM | POA: Diagnosis not present

## 2015-03-03 DIAGNOSIS — E611 Iron deficiency: Secondary | ICD-10-CM | POA: Diagnosis not present

## 2015-03-06 DIAGNOSIS — E611 Iron deficiency: Secondary | ICD-10-CM | POA: Diagnosis not present

## 2015-03-06 DIAGNOSIS — D631 Anemia in chronic kidney disease: Secondary | ICD-10-CM | POA: Diagnosis not present

## 2015-03-06 DIAGNOSIS — Z992 Dependence on renal dialysis: Secondary | ICD-10-CM | POA: Diagnosis not present

## 2015-03-06 DIAGNOSIS — D509 Iron deficiency anemia, unspecified: Secondary | ICD-10-CM | POA: Diagnosis not present

## 2015-03-06 DIAGNOSIS — N2581 Secondary hyperparathyroidism of renal origin: Secondary | ICD-10-CM | POA: Diagnosis not present

## 2015-03-06 DIAGNOSIS — N186 End stage renal disease: Secondary | ICD-10-CM | POA: Diagnosis not present

## 2015-03-08 DIAGNOSIS — Z992 Dependence on renal dialysis: Secondary | ICD-10-CM | POA: Diagnosis not present

## 2015-03-08 DIAGNOSIS — D509 Iron deficiency anemia, unspecified: Secondary | ICD-10-CM | POA: Diagnosis not present

## 2015-03-08 DIAGNOSIS — N186 End stage renal disease: Secondary | ICD-10-CM | POA: Diagnosis not present

## 2015-03-08 DIAGNOSIS — E611 Iron deficiency: Secondary | ICD-10-CM | POA: Diagnosis not present

## 2015-03-08 DIAGNOSIS — N2581 Secondary hyperparathyroidism of renal origin: Secondary | ICD-10-CM | POA: Diagnosis not present

## 2015-03-08 DIAGNOSIS — D631 Anemia in chronic kidney disease: Secondary | ICD-10-CM | POA: Diagnosis not present

## 2015-03-10 DIAGNOSIS — N186 End stage renal disease: Secondary | ICD-10-CM | POA: Diagnosis not present

## 2015-03-10 DIAGNOSIS — E611 Iron deficiency: Secondary | ICD-10-CM | POA: Diagnosis not present

## 2015-03-10 DIAGNOSIS — N2581 Secondary hyperparathyroidism of renal origin: Secondary | ICD-10-CM | POA: Diagnosis not present

## 2015-03-10 DIAGNOSIS — D509 Iron deficiency anemia, unspecified: Secondary | ICD-10-CM | POA: Diagnosis not present

## 2015-03-10 DIAGNOSIS — D631 Anemia in chronic kidney disease: Secondary | ICD-10-CM | POA: Diagnosis not present

## 2015-03-10 DIAGNOSIS — Z992 Dependence on renal dialysis: Secondary | ICD-10-CM | POA: Diagnosis not present

## 2015-03-13 DIAGNOSIS — N186 End stage renal disease: Secondary | ICD-10-CM | POA: Diagnosis not present

## 2015-03-13 DIAGNOSIS — N2581 Secondary hyperparathyroidism of renal origin: Secondary | ICD-10-CM | POA: Diagnosis not present

## 2015-03-13 DIAGNOSIS — D631 Anemia in chronic kidney disease: Secondary | ICD-10-CM | POA: Diagnosis not present

## 2015-03-13 DIAGNOSIS — D509 Iron deficiency anemia, unspecified: Secondary | ICD-10-CM | POA: Diagnosis not present

## 2015-03-13 DIAGNOSIS — E611 Iron deficiency: Secondary | ICD-10-CM | POA: Diagnosis not present

## 2015-03-13 DIAGNOSIS — Z992 Dependence on renal dialysis: Secondary | ICD-10-CM | POA: Diagnosis not present

## 2015-03-15 DIAGNOSIS — D631 Anemia in chronic kidney disease: Secondary | ICD-10-CM | POA: Diagnosis not present

## 2015-03-15 DIAGNOSIS — D509 Iron deficiency anemia, unspecified: Secondary | ICD-10-CM | POA: Diagnosis not present

## 2015-03-15 DIAGNOSIS — N186 End stage renal disease: Secondary | ICD-10-CM | POA: Diagnosis not present

## 2015-03-15 DIAGNOSIS — Z992 Dependence on renal dialysis: Secondary | ICD-10-CM | POA: Diagnosis not present

## 2015-03-15 DIAGNOSIS — E611 Iron deficiency: Secondary | ICD-10-CM | POA: Diagnosis not present

## 2015-03-15 DIAGNOSIS — N2581 Secondary hyperparathyroidism of renal origin: Secondary | ICD-10-CM | POA: Diagnosis not present

## 2015-03-17 DIAGNOSIS — D509 Iron deficiency anemia, unspecified: Secondary | ICD-10-CM | POA: Diagnosis not present

## 2015-03-17 DIAGNOSIS — N186 End stage renal disease: Secondary | ICD-10-CM | POA: Diagnosis not present

## 2015-03-17 DIAGNOSIS — D631 Anemia in chronic kidney disease: Secondary | ICD-10-CM | POA: Diagnosis not present

## 2015-03-17 DIAGNOSIS — N2581 Secondary hyperparathyroidism of renal origin: Secondary | ICD-10-CM | POA: Diagnosis not present

## 2015-03-17 DIAGNOSIS — E611 Iron deficiency: Secondary | ICD-10-CM | POA: Diagnosis not present

## 2015-03-17 DIAGNOSIS — Z992 Dependence on renal dialysis: Secondary | ICD-10-CM | POA: Diagnosis not present

## 2015-03-18 DIAGNOSIS — Z992 Dependence on renal dialysis: Secondary | ICD-10-CM | POA: Diagnosis not present

## 2015-03-18 DIAGNOSIS — N186 End stage renal disease: Secondary | ICD-10-CM | POA: Diagnosis not present

## 2015-03-20 DIAGNOSIS — N186 End stage renal disease: Secondary | ICD-10-CM | POA: Diagnosis not present

## 2015-03-20 DIAGNOSIS — N2581 Secondary hyperparathyroidism of renal origin: Secondary | ICD-10-CM | POA: Diagnosis not present

## 2015-03-20 DIAGNOSIS — D509 Iron deficiency anemia, unspecified: Secondary | ICD-10-CM | POA: Diagnosis not present

## 2015-03-20 DIAGNOSIS — D631 Anemia in chronic kidney disease: Secondary | ICD-10-CM | POA: Diagnosis not present

## 2015-03-20 DIAGNOSIS — Z992 Dependence on renal dialysis: Secondary | ICD-10-CM | POA: Diagnosis not present

## 2015-03-22 DIAGNOSIS — Z992 Dependence on renal dialysis: Secondary | ICD-10-CM | POA: Diagnosis not present

## 2015-03-22 DIAGNOSIS — N186 End stage renal disease: Secondary | ICD-10-CM | POA: Diagnosis not present

## 2015-03-22 DIAGNOSIS — D509 Iron deficiency anemia, unspecified: Secondary | ICD-10-CM | POA: Diagnosis not present

## 2015-03-22 DIAGNOSIS — D631 Anemia in chronic kidney disease: Secondary | ICD-10-CM | POA: Diagnosis not present

## 2015-03-22 DIAGNOSIS — N2581 Secondary hyperparathyroidism of renal origin: Secondary | ICD-10-CM | POA: Diagnosis not present

## 2015-03-23 DIAGNOSIS — Z992 Dependence on renal dialysis: Secondary | ICD-10-CM | POA: Diagnosis not present

## 2015-03-23 DIAGNOSIS — I771 Stricture of artery: Secondary | ICD-10-CM | POA: Diagnosis not present

## 2015-03-23 DIAGNOSIS — N186 End stage renal disease: Secondary | ICD-10-CM | POA: Diagnosis not present

## 2015-03-23 DIAGNOSIS — T82858D Stenosis of vascular prosthetic devices, implants and grafts, subsequent encounter: Secondary | ICD-10-CM | POA: Diagnosis not present

## 2015-03-24 DIAGNOSIS — D509 Iron deficiency anemia, unspecified: Secondary | ICD-10-CM | POA: Diagnosis not present

## 2015-03-24 DIAGNOSIS — N186 End stage renal disease: Secondary | ICD-10-CM | POA: Diagnosis not present

## 2015-03-24 DIAGNOSIS — N2581 Secondary hyperparathyroidism of renal origin: Secondary | ICD-10-CM | POA: Diagnosis not present

## 2015-03-24 DIAGNOSIS — Z992 Dependence on renal dialysis: Secondary | ICD-10-CM | POA: Diagnosis not present

## 2015-03-24 DIAGNOSIS — D631 Anemia in chronic kidney disease: Secondary | ICD-10-CM | POA: Diagnosis not present

## 2015-03-26 DIAGNOSIS — L259 Unspecified contact dermatitis, unspecified cause: Secondary | ICD-10-CM | POA: Diagnosis not present

## 2015-03-27 DIAGNOSIS — Z992 Dependence on renal dialysis: Secondary | ICD-10-CM | POA: Diagnosis not present

## 2015-03-27 DIAGNOSIS — D631 Anemia in chronic kidney disease: Secondary | ICD-10-CM | POA: Diagnosis not present

## 2015-03-27 DIAGNOSIS — N2581 Secondary hyperparathyroidism of renal origin: Secondary | ICD-10-CM | POA: Diagnosis not present

## 2015-03-27 DIAGNOSIS — N186 End stage renal disease: Secondary | ICD-10-CM | POA: Diagnosis not present

## 2015-03-27 DIAGNOSIS — D509 Iron deficiency anemia, unspecified: Secondary | ICD-10-CM | POA: Diagnosis not present

## 2015-03-29 DIAGNOSIS — D631 Anemia in chronic kidney disease: Secondary | ICD-10-CM | POA: Diagnosis not present

## 2015-03-29 DIAGNOSIS — Z992 Dependence on renal dialysis: Secondary | ICD-10-CM | POA: Diagnosis not present

## 2015-03-29 DIAGNOSIS — D509 Iron deficiency anemia, unspecified: Secondary | ICD-10-CM | POA: Diagnosis not present

## 2015-03-29 DIAGNOSIS — N2581 Secondary hyperparathyroidism of renal origin: Secondary | ICD-10-CM | POA: Diagnosis not present

## 2015-03-29 DIAGNOSIS — N186 End stage renal disease: Secondary | ICD-10-CM | POA: Diagnosis not present

## 2015-03-30 DIAGNOSIS — N186 End stage renal disease: Secondary | ICD-10-CM | POA: Diagnosis not present

## 2015-03-30 DIAGNOSIS — D509 Iron deficiency anemia, unspecified: Secondary | ICD-10-CM | POA: Diagnosis not present

## 2015-03-30 DIAGNOSIS — N2581 Secondary hyperparathyroidism of renal origin: Secondary | ICD-10-CM | POA: Diagnosis not present

## 2015-03-30 DIAGNOSIS — Z992 Dependence on renal dialysis: Secondary | ICD-10-CM | POA: Diagnosis not present

## 2015-03-30 DIAGNOSIS — D631 Anemia in chronic kidney disease: Secondary | ICD-10-CM | POA: Diagnosis not present

## 2015-04-03 DIAGNOSIS — N2581 Secondary hyperparathyroidism of renal origin: Secondary | ICD-10-CM | POA: Diagnosis not present

## 2015-04-03 DIAGNOSIS — D631 Anemia in chronic kidney disease: Secondary | ICD-10-CM | POA: Diagnosis not present

## 2015-04-03 DIAGNOSIS — D509 Iron deficiency anemia, unspecified: Secondary | ICD-10-CM | POA: Diagnosis not present

## 2015-04-03 DIAGNOSIS — Z992 Dependence on renal dialysis: Secondary | ICD-10-CM | POA: Diagnosis not present

## 2015-04-03 DIAGNOSIS — N186 End stage renal disease: Secondary | ICD-10-CM | POA: Diagnosis not present

## 2015-04-05 DIAGNOSIS — D631 Anemia in chronic kidney disease: Secondary | ICD-10-CM | POA: Diagnosis not present

## 2015-04-05 DIAGNOSIS — N186 End stage renal disease: Secondary | ICD-10-CM | POA: Diagnosis not present

## 2015-04-05 DIAGNOSIS — N2581 Secondary hyperparathyroidism of renal origin: Secondary | ICD-10-CM | POA: Diagnosis not present

## 2015-04-05 DIAGNOSIS — D509 Iron deficiency anemia, unspecified: Secondary | ICD-10-CM | POA: Diagnosis not present

## 2015-04-05 DIAGNOSIS — Z992 Dependence on renal dialysis: Secondary | ICD-10-CM | POA: Diagnosis not present

## 2015-04-07 DIAGNOSIS — D509 Iron deficiency anemia, unspecified: Secondary | ICD-10-CM | POA: Diagnosis not present

## 2015-04-07 DIAGNOSIS — Z992 Dependence on renal dialysis: Secondary | ICD-10-CM | POA: Diagnosis not present

## 2015-04-07 DIAGNOSIS — D631 Anemia in chronic kidney disease: Secondary | ICD-10-CM | POA: Diagnosis not present

## 2015-04-07 DIAGNOSIS — N186 End stage renal disease: Secondary | ICD-10-CM | POA: Diagnosis not present

## 2015-04-07 DIAGNOSIS — N2581 Secondary hyperparathyroidism of renal origin: Secondary | ICD-10-CM | POA: Diagnosis not present

## 2015-04-10 DIAGNOSIS — Z992 Dependence on renal dialysis: Secondary | ICD-10-CM | POA: Diagnosis not present

## 2015-04-10 DIAGNOSIS — D509 Iron deficiency anemia, unspecified: Secondary | ICD-10-CM | POA: Diagnosis not present

## 2015-04-10 DIAGNOSIS — D631 Anemia in chronic kidney disease: Secondary | ICD-10-CM | POA: Diagnosis not present

## 2015-04-10 DIAGNOSIS — N186 End stage renal disease: Secondary | ICD-10-CM | POA: Diagnosis not present

## 2015-04-10 DIAGNOSIS — N2581 Secondary hyperparathyroidism of renal origin: Secondary | ICD-10-CM | POA: Diagnosis not present

## 2015-04-12 ENCOUNTER — Encounter: Payer: Self-pay | Admitting: Vascular Surgery

## 2015-04-12 DIAGNOSIS — N2581 Secondary hyperparathyroidism of renal origin: Secondary | ICD-10-CM | POA: Diagnosis not present

## 2015-04-12 DIAGNOSIS — N186 End stage renal disease: Secondary | ICD-10-CM | POA: Diagnosis not present

## 2015-04-12 DIAGNOSIS — D509 Iron deficiency anemia, unspecified: Secondary | ICD-10-CM | POA: Diagnosis not present

## 2015-04-12 DIAGNOSIS — Z992 Dependence on renal dialysis: Secondary | ICD-10-CM | POA: Diagnosis not present

## 2015-04-12 DIAGNOSIS — D631 Anemia in chronic kidney disease: Secondary | ICD-10-CM | POA: Diagnosis not present

## 2015-04-13 ENCOUNTER — Ambulatory Visit (INDEPENDENT_AMBULATORY_CARE_PROVIDER_SITE_OTHER): Payer: Medicare Other | Admitting: Vascular Surgery

## 2015-04-13 ENCOUNTER — Encounter: Payer: Self-pay | Admitting: Vascular Surgery

## 2015-04-13 VITALS — BP 173/82 | HR 82 | Ht 65.0 in | Wt 124.0 lb

## 2015-04-13 DIAGNOSIS — N186 End stage renal disease: Secondary | ICD-10-CM | POA: Diagnosis not present

## 2015-04-13 DIAGNOSIS — Z992 Dependence on renal dialysis: Secondary | ICD-10-CM

## 2015-04-13 NOTE — Progress Notes (Signed)
Established Dialysis Access  History of Present Illness  Destiny Boyd is a 81 y.o. (1933-12-13) female who presents for re-evaluation of L BC AVF for SBL.  Reported unable to cannulate the fistula.  Pt denies any steal sx.  She current HD via a TDC.  The patient's PMH, PSH, SH, and FamHx are unchanged from 10/28/13.  Current Outpatient Prescriptions  Medication Sig Dispense Refill  . amLODipine (NORVASC) 10 MG tablet Take 10 mg by mouth daily.    Marland Kitchen aspirin EC 81 MG tablet Take 81 mg by mouth daily.    Marland Kitchen atenolol (TENORMIN) 100 MG tablet Take 100 mg by mouth daily.      . calcitRIOL (ROCALTROL) 0.25 MCG capsule Take 0.25 mcg by mouth daily.     . Cholecalciferol (D3 HIGH POTENCY) 1000 UNITS capsule Take 1,000 Units by mouth daily.    . hydrALAZINE (APRESOLINE) 50 MG tablet Take 50 mg by mouth 2 (two) times daily.     Marland Kitchen linagliptin (TRADJENTA) 5 MG TABS tablet Take 5 mg by mouth daily.    . ranitidine (ZANTAC) 150 MG tablet Take 1 tablet (150 mg total) by mouth 2 (two) times daily. 60 tablet 0  . sucralfate (CARAFATE) 1 GM/10ML suspension Take 10 mLs (1 g total) by mouth 4 (four) times daily -  with meals and at bedtime. 420 mL 0  . traMADol (ULTRAM) 50 MG tablet Take 1 tablet (50 mg total) by mouth every 6 (six) hours as needed. 15 tablet 0  . furosemide (LASIX) 40 MG tablet     . lidocaine-prilocaine (EMLA) cream     . predniSONE (DELTASONE) 10 MG tablet      No current facility-administered medications for this visit.    No Known Allergies  On ROS today: no steal sx, prior infilitration of L BC AVF   Physical Examination  Filed Vitals:   04/13/15 1443 04/13/15 1445  BP: 162/72 173/82  Pulse: 82   Height: 5\' 5"  (1.651 m)   Weight: 124 lb (56.246 kg)   SpO2: 99%    Body mass index is 20.63 kg/(m^2).  General: A&O x 3, WD, WN  Pulmonary: Sym exp, good air movt, CTAB, no rales, rhonchi, & wheezing  Cardiac: RRR, Nl S1, S2, no Murmurs, rubs or  gallops  Vascular: Vessel Right Left  Radial Palpable Not Palpable  Ulnar Not Palpable Not Palpable  Brachial Palpable Palpable   Gastrointestinal: soft, NTND, no G/R, bo HSM, no masses, no CVAT B  Musculoskeletal: M/S 5/5 throughout , Extremities without  ischemic changes , palpable thrill in access in LUA AVG, + bruit in access, visible L BC AVF >8 mm throughout, some visible side branches  Neurologic: Pain and light touch intact in extremities , Motor exam as listed above   Medical Decision Making  Destiny Boyd is a 81 y.o. female who presents with ESRD requiring hemodialysis, s/p L BC AVF which is matured   The fistula is <6 mm deep and >8 mm throughout much of its length.  I doubt any benefit to side branch ligation as side branch ligation only increases the size of the fistula, which is not the issue here anyway.  I suspect the problem is the technique used to cannulate this fistula.  Given the large size to shallow depth, a flat angle of cannulation is necessary to avoid backwalling this fistula.  Available as needed.   Adele Barthel, MD [facility] [place] Office: [phone] Pager:  [phone]  04/13/2015, 3:01 PM

## 2015-04-14 DIAGNOSIS — D509 Iron deficiency anemia, unspecified: Secondary | ICD-10-CM | POA: Diagnosis not present

## 2015-04-14 DIAGNOSIS — N2581 Secondary hyperparathyroidism of renal origin: Secondary | ICD-10-CM | POA: Diagnosis not present

## 2015-04-14 DIAGNOSIS — N186 End stage renal disease: Secondary | ICD-10-CM | POA: Diagnosis not present

## 2015-04-14 DIAGNOSIS — Z992 Dependence on renal dialysis: Secondary | ICD-10-CM | POA: Diagnosis not present

## 2015-04-14 DIAGNOSIS — D631 Anemia in chronic kidney disease: Secondary | ICD-10-CM | POA: Diagnosis not present

## 2015-04-17 ENCOUNTER — Encounter: Payer: Self-pay | Admitting: Nephrology

## 2015-04-17 DIAGNOSIS — N2581 Secondary hyperparathyroidism of renal origin: Secondary | ICD-10-CM | POA: Diagnosis not present

## 2015-04-17 DIAGNOSIS — D509 Iron deficiency anemia, unspecified: Secondary | ICD-10-CM | POA: Diagnosis not present

## 2015-04-17 DIAGNOSIS — Z992 Dependence on renal dialysis: Secondary | ICD-10-CM | POA: Diagnosis not present

## 2015-04-17 DIAGNOSIS — N186 End stage renal disease: Secondary | ICD-10-CM | POA: Diagnosis not present

## 2015-04-17 DIAGNOSIS — D631 Anemia in chronic kidney disease: Secondary | ICD-10-CM | POA: Diagnosis not present

## 2015-04-18 DIAGNOSIS — N186 End stage renal disease: Secondary | ICD-10-CM | POA: Diagnosis not present

## 2015-04-18 DIAGNOSIS — Z992 Dependence on renal dialysis: Secondary | ICD-10-CM | POA: Diagnosis not present

## 2015-04-19 DIAGNOSIS — D509 Iron deficiency anemia, unspecified: Secondary | ICD-10-CM | POA: Diagnosis not present

## 2015-04-19 DIAGNOSIS — N186 End stage renal disease: Secondary | ICD-10-CM | POA: Diagnosis not present

## 2015-04-19 DIAGNOSIS — D631 Anemia in chronic kidney disease: Secondary | ICD-10-CM | POA: Diagnosis not present

## 2015-04-19 DIAGNOSIS — Z992 Dependence on renal dialysis: Secondary | ICD-10-CM | POA: Diagnosis not present

## 2015-04-19 DIAGNOSIS — Z23 Encounter for immunization: Secondary | ICD-10-CM | POA: Diagnosis not present

## 2015-04-19 DIAGNOSIS — N2581 Secondary hyperparathyroidism of renal origin: Secondary | ICD-10-CM | POA: Diagnosis not present

## 2015-04-21 DIAGNOSIS — Z992 Dependence on renal dialysis: Secondary | ICD-10-CM | POA: Diagnosis not present

## 2015-04-21 DIAGNOSIS — D631 Anemia in chronic kidney disease: Secondary | ICD-10-CM | POA: Diagnosis not present

## 2015-04-21 DIAGNOSIS — N2581 Secondary hyperparathyroidism of renal origin: Secondary | ICD-10-CM | POA: Diagnosis not present

## 2015-04-21 DIAGNOSIS — N186 End stage renal disease: Secondary | ICD-10-CM | POA: Diagnosis not present

## 2015-04-21 DIAGNOSIS — Z23 Encounter for immunization: Secondary | ICD-10-CM | POA: Diagnosis not present

## 2015-04-21 DIAGNOSIS — D509 Iron deficiency anemia, unspecified: Secondary | ICD-10-CM | POA: Diagnosis not present

## 2015-04-24 DIAGNOSIS — N2581 Secondary hyperparathyroidism of renal origin: Secondary | ICD-10-CM | POA: Diagnosis not present

## 2015-04-24 DIAGNOSIS — D509 Iron deficiency anemia, unspecified: Secondary | ICD-10-CM | POA: Diagnosis not present

## 2015-04-24 DIAGNOSIS — Z992 Dependence on renal dialysis: Secondary | ICD-10-CM | POA: Diagnosis not present

## 2015-04-24 DIAGNOSIS — Z23 Encounter for immunization: Secondary | ICD-10-CM | POA: Diagnosis not present

## 2015-04-24 DIAGNOSIS — D631 Anemia in chronic kidney disease: Secondary | ICD-10-CM | POA: Diagnosis not present

## 2015-04-24 DIAGNOSIS — N186 End stage renal disease: Secondary | ICD-10-CM | POA: Diagnosis not present

## 2015-04-26 DIAGNOSIS — Z23 Encounter for immunization: Secondary | ICD-10-CM | POA: Diagnosis not present

## 2015-04-26 DIAGNOSIS — D509 Iron deficiency anemia, unspecified: Secondary | ICD-10-CM | POA: Diagnosis not present

## 2015-04-26 DIAGNOSIS — D631 Anemia in chronic kidney disease: Secondary | ICD-10-CM | POA: Diagnosis not present

## 2015-04-26 DIAGNOSIS — Z992 Dependence on renal dialysis: Secondary | ICD-10-CM | POA: Diagnosis not present

## 2015-04-26 DIAGNOSIS — N2581 Secondary hyperparathyroidism of renal origin: Secondary | ICD-10-CM | POA: Diagnosis not present

## 2015-04-26 DIAGNOSIS — N186 End stage renal disease: Secondary | ICD-10-CM | POA: Diagnosis not present

## 2015-04-28 DIAGNOSIS — D509 Iron deficiency anemia, unspecified: Secondary | ICD-10-CM | POA: Diagnosis not present

## 2015-04-28 DIAGNOSIS — D631 Anemia in chronic kidney disease: Secondary | ICD-10-CM | POA: Diagnosis not present

## 2015-04-28 DIAGNOSIS — N186 End stage renal disease: Secondary | ICD-10-CM | POA: Diagnosis not present

## 2015-04-28 DIAGNOSIS — Z992 Dependence on renal dialysis: Secondary | ICD-10-CM | POA: Diagnosis not present

## 2015-04-28 DIAGNOSIS — N2581 Secondary hyperparathyroidism of renal origin: Secondary | ICD-10-CM | POA: Diagnosis not present

## 2015-04-28 DIAGNOSIS — Z23 Encounter for immunization: Secondary | ICD-10-CM | POA: Diagnosis not present

## 2015-05-01 DIAGNOSIS — D631 Anemia in chronic kidney disease: Secondary | ICD-10-CM | POA: Diagnosis not present

## 2015-05-01 DIAGNOSIS — D509 Iron deficiency anemia, unspecified: Secondary | ICD-10-CM | POA: Diagnosis not present

## 2015-05-01 DIAGNOSIS — Z23 Encounter for immunization: Secondary | ICD-10-CM | POA: Diagnosis not present

## 2015-05-01 DIAGNOSIS — Z992 Dependence on renal dialysis: Secondary | ICD-10-CM | POA: Diagnosis not present

## 2015-05-01 DIAGNOSIS — N2581 Secondary hyperparathyroidism of renal origin: Secondary | ICD-10-CM | POA: Diagnosis not present

## 2015-05-01 DIAGNOSIS — N186 End stage renal disease: Secondary | ICD-10-CM | POA: Diagnosis not present

## 2015-05-03 DIAGNOSIS — Z23 Encounter for immunization: Secondary | ICD-10-CM | POA: Diagnosis not present

## 2015-05-03 DIAGNOSIS — N2581 Secondary hyperparathyroidism of renal origin: Secondary | ICD-10-CM | POA: Diagnosis not present

## 2015-05-03 DIAGNOSIS — D509 Iron deficiency anemia, unspecified: Secondary | ICD-10-CM | POA: Diagnosis not present

## 2015-05-03 DIAGNOSIS — N186 End stage renal disease: Secondary | ICD-10-CM | POA: Diagnosis not present

## 2015-05-03 DIAGNOSIS — D631 Anemia in chronic kidney disease: Secondary | ICD-10-CM | POA: Diagnosis not present

## 2015-05-03 DIAGNOSIS — Z992 Dependence on renal dialysis: Secondary | ICD-10-CM | POA: Diagnosis not present

## 2015-05-05 DIAGNOSIS — D509 Iron deficiency anemia, unspecified: Secondary | ICD-10-CM | POA: Diagnosis not present

## 2015-05-05 DIAGNOSIS — N2581 Secondary hyperparathyroidism of renal origin: Secondary | ICD-10-CM | POA: Diagnosis not present

## 2015-05-05 DIAGNOSIS — D631 Anemia in chronic kidney disease: Secondary | ICD-10-CM | POA: Diagnosis not present

## 2015-05-05 DIAGNOSIS — N186 End stage renal disease: Secondary | ICD-10-CM | POA: Diagnosis not present

## 2015-05-05 DIAGNOSIS — Z992 Dependence on renal dialysis: Secondary | ICD-10-CM | POA: Diagnosis not present

## 2015-05-05 DIAGNOSIS — Z23 Encounter for immunization: Secondary | ICD-10-CM | POA: Diagnosis not present

## 2015-05-08 DIAGNOSIS — Z23 Encounter for immunization: Secondary | ICD-10-CM | POA: Diagnosis not present

## 2015-05-08 DIAGNOSIS — D509 Iron deficiency anemia, unspecified: Secondary | ICD-10-CM | POA: Diagnosis not present

## 2015-05-08 DIAGNOSIS — D631 Anemia in chronic kidney disease: Secondary | ICD-10-CM | POA: Diagnosis not present

## 2015-05-08 DIAGNOSIS — N2581 Secondary hyperparathyroidism of renal origin: Secondary | ICD-10-CM | POA: Diagnosis not present

## 2015-05-08 DIAGNOSIS — Z992 Dependence on renal dialysis: Secondary | ICD-10-CM | POA: Diagnosis not present

## 2015-05-08 DIAGNOSIS — N186 End stage renal disease: Secondary | ICD-10-CM | POA: Diagnosis not present

## 2015-05-10 DIAGNOSIS — N186 End stage renal disease: Secondary | ICD-10-CM | POA: Diagnosis not present

## 2015-05-10 DIAGNOSIS — Z992 Dependence on renal dialysis: Secondary | ICD-10-CM | POA: Diagnosis not present

## 2015-05-10 DIAGNOSIS — D509 Iron deficiency anemia, unspecified: Secondary | ICD-10-CM | POA: Diagnosis not present

## 2015-05-10 DIAGNOSIS — N2581 Secondary hyperparathyroidism of renal origin: Secondary | ICD-10-CM | POA: Diagnosis not present

## 2015-05-10 DIAGNOSIS — D631 Anemia in chronic kidney disease: Secondary | ICD-10-CM | POA: Diagnosis not present

## 2015-05-10 DIAGNOSIS — Z23 Encounter for immunization: Secondary | ICD-10-CM | POA: Diagnosis not present

## 2015-05-11 DIAGNOSIS — I10 Essential (primary) hypertension: Secondary | ICD-10-CM | POA: Diagnosis not present

## 2015-05-11 DIAGNOSIS — E118 Type 2 diabetes mellitus with unspecified complications: Secondary | ICD-10-CM | POA: Diagnosis not present

## 2015-05-11 DIAGNOSIS — M109 Gout, unspecified: Secondary | ICD-10-CM | POA: Diagnosis not present

## 2015-05-11 DIAGNOSIS — D649 Anemia, unspecified: Secondary | ICD-10-CM | POA: Diagnosis not present

## 2015-05-11 DIAGNOSIS — N184 Chronic kidney disease, stage 4 (severe): Secondary | ICD-10-CM | POA: Diagnosis not present

## 2015-05-11 DIAGNOSIS — N186 End stage renal disease: Secondary | ICD-10-CM | POA: Diagnosis not present

## 2015-05-12 DIAGNOSIS — Z992 Dependence on renal dialysis: Secondary | ICD-10-CM | POA: Diagnosis not present

## 2015-05-12 DIAGNOSIS — D509 Iron deficiency anemia, unspecified: Secondary | ICD-10-CM | POA: Diagnosis not present

## 2015-05-12 DIAGNOSIS — D631 Anemia in chronic kidney disease: Secondary | ICD-10-CM | POA: Diagnosis not present

## 2015-05-12 DIAGNOSIS — Z23 Encounter for immunization: Secondary | ICD-10-CM | POA: Diagnosis not present

## 2015-05-12 DIAGNOSIS — N2581 Secondary hyperparathyroidism of renal origin: Secondary | ICD-10-CM | POA: Diagnosis not present

## 2015-05-12 DIAGNOSIS — N186 End stage renal disease: Secondary | ICD-10-CM | POA: Diagnosis not present

## 2015-05-15 DIAGNOSIS — Z992 Dependence on renal dialysis: Secondary | ICD-10-CM | POA: Diagnosis not present

## 2015-05-15 DIAGNOSIS — N2581 Secondary hyperparathyroidism of renal origin: Secondary | ICD-10-CM | POA: Diagnosis not present

## 2015-05-15 DIAGNOSIS — N186 End stage renal disease: Secondary | ICD-10-CM | POA: Diagnosis not present

## 2015-05-15 DIAGNOSIS — D509 Iron deficiency anemia, unspecified: Secondary | ICD-10-CM | POA: Diagnosis not present

## 2015-05-15 DIAGNOSIS — D631 Anemia in chronic kidney disease: Secondary | ICD-10-CM | POA: Diagnosis not present

## 2015-05-15 DIAGNOSIS — Z23 Encounter for immunization: Secondary | ICD-10-CM | POA: Diagnosis not present

## 2015-05-17 DIAGNOSIS — D509 Iron deficiency anemia, unspecified: Secondary | ICD-10-CM | POA: Diagnosis not present

## 2015-05-17 DIAGNOSIS — N186 End stage renal disease: Secondary | ICD-10-CM | POA: Diagnosis not present

## 2015-05-17 DIAGNOSIS — Z992 Dependence on renal dialysis: Secondary | ICD-10-CM | POA: Diagnosis not present

## 2015-05-17 DIAGNOSIS — Z23 Encounter for immunization: Secondary | ICD-10-CM | POA: Diagnosis not present

## 2015-05-17 DIAGNOSIS — D631 Anemia in chronic kidney disease: Secondary | ICD-10-CM | POA: Diagnosis not present

## 2015-05-17 DIAGNOSIS — N2581 Secondary hyperparathyroidism of renal origin: Secondary | ICD-10-CM | POA: Diagnosis not present

## 2015-05-18 DIAGNOSIS — Z23 Encounter for immunization: Secondary | ICD-10-CM | POA: Diagnosis not present

## 2015-05-18 DIAGNOSIS — Z992 Dependence on renal dialysis: Secondary | ICD-10-CM | POA: Diagnosis not present

## 2015-05-18 DIAGNOSIS — N186 End stage renal disease: Secondary | ICD-10-CM | POA: Diagnosis not present

## 2015-05-18 DIAGNOSIS — N2581 Secondary hyperparathyroidism of renal origin: Secondary | ICD-10-CM | POA: Diagnosis not present

## 2015-05-18 DIAGNOSIS — D631 Anemia in chronic kidney disease: Secondary | ICD-10-CM | POA: Diagnosis not present

## 2015-05-18 DIAGNOSIS — D509 Iron deficiency anemia, unspecified: Secondary | ICD-10-CM | POA: Diagnosis not present

## 2015-05-22 DIAGNOSIS — D631 Anemia in chronic kidney disease: Secondary | ICD-10-CM | POA: Diagnosis not present

## 2015-05-22 DIAGNOSIS — D509 Iron deficiency anemia, unspecified: Secondary | ICD-10-CM | POA: Diagnosis not present

## 2015-05-22 DIAGNOSIS — N2581 Secondary hyperparathyroidism of renal origin: Secondary | ICD-10-CM | POA: Diagnosis not present

## 2015-05-22 DIAGNOSIS — N186 End stage renal disease: Secondary | ICD-10-CM | POA: Diagnosis not present

## 2015-05-22 DIAGNOSIS — Z992 Dependence on renal dialysis: Secondary | ICD-10-CM | POA: Diagnosis not present

## 2015-05-23 DIAGNOSIS — Z452 Encounter for adjustment and management of vascular access device: Secondary | ICD-10-CM | POA: Diagnosis not present

## 2015-05-24 DIAGNOSIS — D509 Iron deficiency anemia, unspecified: Secondary | ICD-10-CM | POA: Diagnosis not present

## 2015-05-24 DIAGNOSIS — D631 Anemia in chronic kidney disease: Secondary | ICD-10-CM | POA: Diagnosis not present

## 2015-05-24 DIAGNOSIS — N2581 Secondary hyperparathyroidism of renal origin: Secondary | ICD-10-CM | POA: Diagnosis not present

## 2015-05-24 DIAGNOSIS — Z992 Dependence on renal dialysis: Secondary | ICD-10-CM | POA: Diagnosis not present

## 2015-05-24 DIAGNOSIS — N186 End stage renal disease: Secondary | ICD-10-CM | POA: Diagnosis not present

## 2015-05-26 DIAGNOSIS — D509 Iron deficiency anemia, unspecified: Secondary | ICD-10-CM | POA: Diagnosis not present

## 2015-05-26 DIAGNOSIS — Z992 Dependence on renal dialysis: Secondary | ICD-10-CM | POA: Diagnosis not present

## 2015-05-26 DIAGNOSIS — D631 Anemia in chronic kidney disease: Secondary | ICD-10-CM | POA: Diagnosis not present

## 2015-05-26 DIAGNOSIS — N2581 Secondary hyperparathyroidism of renal origin: Secondary | ICD-10-CM | POA: Diagnosis not present

## 2015-05-26 DIAGNOSIS — N186 End stage renal disease: Secondary | ICD-10-CM | POA: Diagnosis not present

## 2015-05-29 DIAGNOSIS — Z992 Dependence on renal dialysis: Secondary | ICD-10-CM | POA: Diagnosis not present

## 2015-05-29 DIAGNOSIS — N186 End stage renal disease: Secondary | ICD-10-CM | POA: Diagnosis not present

## 2015-05-29 DIAGNOSIS — D631 Anemia in chronic kidney disease: Secondary | ICD-10-CM | POA: Diagnosis not present

## 2015-05-29 DIAGNOSIS — N2581 Secondary hyperparathyroidism of renal origin: Secondary | ICD-10-CM | POA: Diagnosis not present

## 2015-05-29 DIAGNOSIS — D509 Iron deficiency anemia, unspecified: Secondary | ICD-10-CM | POA: Diagnosis not present

## 2015-05-31 DIAGNOSIS — N2581 Secondary hyperparathyroidism of renal origin: Secondary | ICD-10-CM | POA: Diagnosis not present

## 2015-05-31 DIAGNOSIS — D631 Anemia in chronic kidney disease: Secondary | ICD-10-CM | POA: Diagnosis not present

## 2015-05-31 DIAGNOSIS — D509 Iron deficiency anemia, unspecified: Secondary | ICD-10-CM | POA: Diagnosis not present

## 2015-05-31 DIAGNOSIS — Z992 Dependence on renal dialysis: Secondary | ICD-10-CM | POA: Diagnosis not present

## 2015-05-31 DIAGNOSIS — N186 End stage renal disease: Secondary | ICD-10-CM | POA: Diagnosis not present

## 2015-06-02 DIAGNOSIS — D509 Iron deficiency anemia, unspecified: Secondary | ICD-10-CM | POA: Diagnosis not present

## 2015-06-02 DIAGNOSIS — Z992 Dependence on renal dialysis: Secondary | ICD-10-CM | POA: Diagnosis not present

## 2015-06-02 DIAGNOSIS — N186 End stage renal disease: Secondary | ICD-10-CM | POA: Diagnosis not present

## 2015-06-02 DIAGNOSIS — N2581 Secondary hyperparathyroidism of renal origin: Secondary | ICD-10-CM | POA: Diagnosis not present

## 2015-06-02 DIAGNOSIS — D631 Anemia in chronic kidney disease: Secondary | ICD-10-CM | POA: Diagnosis not present

## 2015-06-05 DIAGNOSIS — Z992 Dependence on renal dialysis: Secondary | ICD-10-CM | POA: Diagnosis not present

## 2015-06-05 DIAGNOSIS — N186 End stage renal disease: Secondary | ICD-10-CM | POA: Diagnosis not present

## 2015-06-05 DIAGNOSIS — D509 Iron deficiency anemia, unspecified: Secondary | ICD-10-CM | POA: Diagnosis not present

## 2015-06-05 DIAGNOSIS — N2581 Secondary hyperparathyroidism of renal origin: Secondary | ICD-10-CM | POA: Diagnosis not present

## 2015-06-05 DIAGNOSIS — D631 Anemia in chronic kidney disease: Secondary | ICD-10-CM | POA: Diagnosis not present

## 2015-06-07 DIAGNOSIS — Z992 Dependence on renal dialysis: Secondary | ICD-10-CM | POA: Diagnosis not present

## 2015-06-07 DIAGNOSIS — D509 Iron deficiency anemia, unspecified: Secondary | ICD-10-CM | POA: Diagnosis not present

## 2015-06-07 DIAGNOSIS — N186 End stage renal disease: Secondary | ICD-10-CM | POA: Diagnosis not present

## 2015-06-07 DIAGNOSIS — D631 Anemia in chronic kidney disease: Secondary | ICD-10-CM | POA: Diagnosis not present

## 2015-06-07 DIAGNOSIS — N2581 Secondary hyperparathyroidism of renal origin: Secondary | ICD-10-CM | POA: Diagnosis not present

## 2015-06-09 DIAGNOSIS — N186 End stage renal disease: Secondary | ICD-10-CM | POA: Diagnosis not present

## 2015-06-09 DIAGNOSIS — Z992 Dependence on renal dialysis: Secondary | ICD-10-CM | POA: Diagnosis not present

## 2015-06-09 DIAGNOSIS — N2581 Secondary hyperparathyroidism of renal origin: Secondary | ICD-10-CM | POA: Diagnosis not present

## 2015-06-09 DIAGNOSIS — D631 Anemia in chronic kidney disease: Secondary | ICD-10-CM | POA: Diagnosis not present

## 2015-06-09 DIAGNOSIS — D509 Iron deficiency anemia, unspecified: Secondary | ICD-10-CM | POA: Diagnosis not present

## 2015-06-12 DIAGNOSIS — D509 Iron deficiency anemia, unspecified: Secondary | ICD-10-CM | POA: Diagnosis not present

## 2015-06-12 DIAGNOSIS — D631 Anemia in chronic kidney disease: Secondary | ICD-10-CM | POA: Diagnosis not present

## 2015-06-12 DIAGNOSIS — N2581 Secondary hyperparathyroidism of renal origin: Secondary | ICD-10-CM | POA: Diagnosis not present

## 2015-06-12 DIAGNOSIS — N186 End stage renal disease: Secondary | ICD-10-CM | POA: Diagnosis not present

## 2015-06-12 DIAGNOSIS — Z992 Dependence on renal dialysis: Secondary | ICD-10-CM | POA: Diagnosis not present

## 2015-06-14 DIAGNOSIS — N186 End stage renal disease: Secondary | ICD-10-CM | POA: Diagnosis not present

## 2015-06-14 DIAGNOSIS — N2581 Secondary hyperparathyroidism of renal origin: Secondary | ICD-10-CM | POA: Diagnosis not present

## 2015-06-14 DIAGNOSIS — D631 Anemia in chronic kidney disease: Secondary | ICD-10-CM | POA: Diagnosis not present

## 2015-06-14 DIAGNOSIS — D509 Iron deficiency anemia, unspecified: Secondary | ICD-10-CM | POA: Diagnosis not present

## 2015-06-14 DIAGNOSIS — Z992 Dependence on renal dialysis: Secondary | ICD-10-CM | POA: Diagnosis not present

## 2015-06-16 DIAGNOSIS — D631 Anemia in chronic kidney disease: Secondary | ICD-10-CM | POA: Diagnosis not present

## 2015-06-16 DIAGNOSIS — Z992 Dependence on renal dialysis: Secondary | ICD-10-CM | POA: Diagnosis not present

## 2015-06-16 DIAGNOSIS — D509 Iron deficiency anemia, unspecified: Secondary | ICD-10-CM | POA: Diagnosis not present

## 2015-06-16 DIAGNOSIS — N2581 Secondary hyperparathyroidism of renal origin: Secondary | ICD-10-CM | POA: Diagnosis not present

## 2015-06-16 DIAGNOSIS — N186 End stage renal disease: Secondary | ICD-10-CM | POA: Diagnosis not present

## 2015-06-18 DIAGNOSIS — N186 End stage renal disease: Secondary | ICD-10-CM | POA: Diagnosis not present

## 2015-06-18 DIAGNOSIS — Z992 Dependence on renal dialysis: Secondary | ICD-10-CM | POA: Diagnosis not present

## 2015-06-19 DIAGNOSIS — D509 Iron deficiency anemia, unspecified: Secondary | ICD-10-CM | POA: Diagnosis not present

## 2015-06-19 DIAGNOSIS — D631 Anemia in chronic kidney disease: Secondary | ICD-10-CM | POA: Diagnosis not present

## 2015-06-19 DIAGNOSIS — N2581 Secondary hyperparathyroidism of renal origin: Secondary | ICD-10-CM | POA: Diagnosis not present

## 2015-06-19 DIAGNOSIS — Z992 Dependence on renal dialysis: Secondary | ICD-10-CM | POA: Diagnosis not present

## 2015-06-19 DIAGNOSIS — N186 End stage renal disease: Secondary | ICD-10-CM | POA: Diagnosis not present

## 2015-06-21 DIAGNOSIS — N186 End stage renal disease: Secondary | ICD-10-CM | POA: Diagnosis not present

## 2015-06-21 DIAGNOSIS — D631 Anemia in chronic kidney disease: Secondary | ICD-10-CM | POA: Diagnosis not present

## 2015-06-21 DIAGNOSIS — N2581 Secondary hyperparathyroidism of renal origin: Secondary | ICD-10-CM | POA: Diagnosis not present

## 2015-06-21 DIAGNOSIS — D509 Iron deficiency anemia, unspecified: Secondary | ICD-10-CM | POA: Diagnosis not present

## 2015-06-21 DIAGNOSIS — Z992 Dependence on renal dialysis: Secondary | ICD-10-CM | POA: Diagnosis not present

## 2015-06-23 DIAGNOSIS — D631 Anemia in chronic kidney disease: Secondary | ICD-10-CM | POA: Diagnosis not present

## 2015-06-23 DIAGNOSIS — D509 Iron deficiency anemia, unspecified: Secondary | ICD-10-CM | POA: Diagnosis not present

## 2015-06-23 DIAGNOSIS — N186 End stage renal disease: Secondary | ICD-10-CM | POA: Diagnosis not present

## 2015-06-23 DIAGNOSIS — Z992 Dependence on renal dialysis: Secondary | ICD-10-CM | POA: Diagnosis not present

## 2015-06-23 DIAGNOSIS — N2581 Secondary hyperparathyroidism of renal origin: Secondary | ICD-10-CM | POA: Diagnosis not present

## 2015-06-26 DIAGNOSIS — Z992 Dependence on renal dialysis: Secondary | ICD-10-CM | POA: Diagnosis not present

## 2015-06-26 DIAGNOSIS — N2581 Secondary hyperparathyroidism of renal origin: Secondary | ICD-10-CM | POA: Diagnosis not present

## 2015-06-26 DIAGNOSIS — N186 End stage renal disease: Secondary | ICD-10-CM | POA: Diagnosis not present

## 2015-06-26 DIAGNOSIS — D631 Anemia in chronic kidney disease: Secondary | ICD-10-CM | POA: Diagnosis not present

## 2015-06-26 DIAGNOSIS — D509 Iron deficiency anemia, unspecified: Secondary | ICD-10-CM | POA: Diagnosis not present

## 2015-06-28 DIAGNOSIS — D631 Anemia in chronic kidney disease: Secondary | ICD-10-CM | POA: Diagnosis not present

## 2015-06-28 DIAGNOSIS — D509 Iron deficiency anemia, unspecified: Secondary | ICD-10-CM | POA: Diagnosis not present

## 2015-06-28 DIAGNOSIS — N2581 Secondary hyperparathyroidism of renal origin: Secondary | ICD-10-CM | POA: Diagnosis not present

## 2015-06-28 DIAGNOSIS — Z992 Dependence on renal dialysis: Secondary | ICD-10-CM | POA: Diagnosis not present

## 2015-06-28 DIAGNOSIS — N186 End stage renal disease: Secondary | ICD-10-CM | POA: Diagnosis not present

## 2015-06-30 DIAGNOSIS — N2581 Secondary hyperparathyroidism of renal origin: Secondary | ICD-10-CM | POA: Diagnosis not present

## 2015-06-30 DIAGNOSIS — N186 End stage renal disease: Secondary | ICD-10-CM | POA: Diagnosis not present

## 2015-06-30 DIAGNOSIS — D509 Iron deficiency anemia, unspecified: Secondary | ICD-10-CM | POA: Diagnosis not present

## 2015-06-30 DIAGNOSIS — D631 Anemia in chronic kidney disease: Secondary | ICD-10-CM | POA: Diagnosis not present

## 2015-06-30 DIAGNOSIS — Z992 Dependence on renal dialysis: Secondary | ICD-10-CM | POA: Diagnosis not present

## 2015-07-03 DIAGNOSIS — Z992 Dependence on renal dialysis: Secondary | ICD-10-CM | POA: Diagnosis not present

## 2015-07-03 DIAGNOSIS — D631 Anemia in chronic kidney disease: Secondary | ICD-10-CM | POA: Diagnosis not present

## 2015-07-03 DIAGNOSIS — N186 End stage renal disease: Secondary | ICD-10-CM | POA: Diagnosis not present

## 2015-07-03 DIAGNOSIS — N2581 Secondary hyperparathyroidism of renal origin: Secondary | ICD-10-CM | POA: Diagnosis not present

## 2015-07-03 DIAGNOSIS — D509 Iron deficiency anemia, unspecified: Secondary | ICD-10-CM | POA: Diagnosis not present

## 2015-07-05 DIAGNOSIS — D509 Iron deficiency anemia, unspecified: Secondary | ICD-10-CM | POA: Diagnosis not present

## 2015-07-05 DIAGNOSIS — D631 Anemia in chronic kidney disease: Secondary | ICD-10-CM | POA: Diagnosis not present

## 2015-07-05 DIAGNOSIS — Z992 Dependence on renal dialysis: Secondary | ICD-10-CM | POA: Diagnosis not present

## 2015-07-05 DIAGNOSIS — N2581 Secondary hyperparathyroidism of renal origin: Secondary | ICD-10-CM | POA: Diagnosis not present

## 2015-07-05 DIAGNOSIS — N186 End stage renal disease: Secondary | ICD-10-CM | POA: Diagnosis not present

## 2015-07-07 DIAGNOSIS — N2581 Secondary hyperparathyroidism of renal origin: Secondary | ICD-10-CM | POA: Diagnosis not present

## 2015-07-07 DIAGNOSIS — Z992 Dependence on renal dialysis: Secondary | ICD-10-CM | POA: Diagnosis not present

## 2015-07-07 DIAGNOSIS — D509 Iron deficiency anemia, unspecified: Secondary | ICD-10-CM | POA: Diagnosis not present

## 2015-07-07 DIAGNOSIS — N186 End stage renal disease: Secondary | ICD-10-CM | POA: Diagnosis not present

## 2015-07-07 DIAGNOSIS — D631 Anemia in chronic kidney disease: Secondary | ICD-10-CM | POA: Diagnosis not present

## 2015-07-10 DIAGNOSIS — Z992 Dependence on renal dialysis: Secondary | ICD-10-CM | POA: Diagnosis not present

## 2015-07-10 DIAGNOSIS — D509 Iron deficiency anemia, unspecified: Secondary | ICD-10-CM | POA: Diagnosis not present

## 2015-07-10 DIAGNOSIS — N186 End stage renal disease: Secondary | ICD-10-CM | POA: Diagnosis not present

## 2015-07-10 DIAGNOSIS — D631 Anemia in chronic kidney disease: Secondary | ICD-10-CM | POA: Diagnosis not present

## 2015-07-10 DIAGNOSIS — N2581 Secondary hyperparathyroidism of renal origin: Secondary | ICD-10-CM | POA: Diagnosis not present

## 2015-07-12 DIAGNOSIS — N2581 Secondary hyperparathyroidism of renal origin: Secondary | ICD-10-CM | POA: Diagnosis not present

## 2015-07-12 DIAGNOSIS — N186 End stage renal disease: Secondary | ICD-10-CM | POA: Diagnosis not present

## 2015-07-12 DIAGNOSIS — D631 Anemia in chronic kidney disease: Secondary | ICD-10-CM | POA: Diagnosis not present

## 2015-07-12 DIAGNOSIS — D509 Iron deficiency anemia, unspecified: Secondary | ICD-10-CM | POA: Diagnosis not present

## 2015-07-12 DIAGNOSIS — Z992 Dependence on renal dialysis: Secondary | ICD-10-CM | POA: Diagnosis not present

## 2015-07-14 DIAGNOSIS — D509 Iron deficiency anemia, unspecified: Secondary | ICD-10-CM | POA: Diagnosis not present

## 2015-07-14 DIAGNOSIS — N2581 Secondary hyperparathyroidism of renal origin: Secondary | ICD-10-CM | POA: Diagnosis not present

## 2015-07-14 DIAGNOSIS — D631 Anemia in chronic kidney disease: Secondary | ICD-10-CM | POA: Diagnosis not present

## 2015-07-14 DIAGNOSIS — Z992 Dependence on renal dialysis: Secondary | ICD-10-CM | POA: Diagnosis not present

## 2015-07-14 DIAGNOSIS — N186 End stage renal disease: Secondary | ICD-10-CM | POA: Diagnosis not present

## 2015-07-17 DIAGNOSIS — N2581 Secondary hyperparathyroidism of renal origin: Secondary | ICD-10-CM | POA: Diagnosis not present

## 2015-07-17 DIAGNOSIS — N186 End stage renal disease: Secondary | ICD-10-CM | POA: Diagnosis not present

## 2015-07-17 DIAGNOSIS — D509 Iron deficiency anemia, unspecified: Secondary | ICD-10-CM | POA: Diagnosis not present

## 2015-07-17 DIAGNOSIS — D631 Anemia in chronic kidney disease: Secondary | ICD-10-CM | POA: Diagnosis not present

## 2015-07-17 DIAGNOSIS — Z992 Dependence on renal dialysis: Secondary | ICD-10-CM | POA: Diagnosis not present

## 2015-07-18 DIAGNOSIS — Z992 Dependence on renal dialysis: Secondary | ICD-10-CM | POA: Diagnosis not present

## 2015-07-18 DIAGNOSIS — N186 End stage renal disease: Secondary | ICD-10-CM | POA: Diagnosis not present

## 2015-07-19 DIAGNOSIS — N186 End stage renal disease: Secondary | ICD-10-CM | POA: Diagnosis not present

## 2015-07-19 DIAGNOSIS — E162 Hypoglycemia, unspecified: Secondary | ICD-10-CM | POA: Diagnosis not present

## 2015-07-19 DIAGNOSIS — N2581 Secondary hyperparathyroidism of renal origin: Secondary | ICD-10-CM | POA: Diagnosis not present

## 2015-07-19 DIAGNOSIS — Z992 Dependence on renal dialysis: Secondary | ICD-10-CM | POA: Diagnosis not present

## 2015-07-19 DIAGNOSIS — E11649 Type 2 diabetes mellitus with hypoglycemia without coma: Secondary | ICD-10-CM | POA: Diagnosis not present

## 2015-07-19 DIAGNOSIS — D509 Iron deficiency anemia, unspecified: Secondary | ICD-10-CM | POA: Diagnosis not present

## 2015-07-19 DIAGNOSIS — D631 Anemia in chronic kidney disease: Secondary | ICD-10-CM | POA: Diagnosis not present

## 2015-07-21 DIAGNOSIS — D509 Iron deficiency anemia, unspecified: Secondary | ICD-10-CM | POA: Diagnosis not present

## 2015-07-21 DIAGNOSIS — E162 Hypoglycemia, unspecified: Secondary | ICD-10-CM | POA: Diagnosis not present

## 2015-07-21 DIAGNOSIS — D631 Anemia in chronic kidney disease: Secondary | ICD-10-CM | POA: Diagnosis not present

## 2015-07-21 DIAGNOSIS — N186 End stage renal disease: Secondary | ICD-10-CM | POA: Diagnosis not present

## 2015-07-21 DIAGNOSIS — N2581 Secondary hyperparathyroidism of renal origin: Secondary | ICD-10-CM | POA: Diagnosis not present

## 2015-07-21 DIAGNOSIS — Z992 Dependence on renal dialysis: Secondary | ICD-10-CM | POA: Diagnosis not present

## 2015-07-24 DIAGNOSIS — D631 Anemia in chronic kidney disease: Secondary | ICD-10-CM | POA: Diagnosis not present

## 2015-07-24 DIAGNOSIS — N186 End stage renal disease: Secondary | ICD-10-CM | POA: Diagnosis not present

## 2015-07-24 DIAGNOSIS — D509 Iron deficiency anemia, unspecified: Secondary | ICD-10-CM | POA: Diagnosis not present

## 2015-07-24 DIAGNOSIS — Z992 Dependence on renal dialysis: Secondary | ICD-10-CM | POA: Diagnosis not present

## 2015-07-24 DIAGNOSIS — E162 Hypoglycemia, unspecified: Secondary | ICD-10-CM | POA: Diagnosis not present

## 2015-07-24 DIAGNOSIS — N2581 Secondary hyperparathyroidism of renal origin: Secondary | ICD-10-CM | POA: Diagnosis not present

## 2015-07-26 DIAGNOSIS — E162 Hypoglycemia, unspecified: Secondary | ICD-10-CM | POA: Diagnosis not present

## 2015-07-26 DIAGNOSIS — D509 Iron deficiency anemia, unspecified: Secondary | ICD-10-CM | POA: Diagnosis not present

## 2015-07-26 DIAGNOSIS — D631 Anemia in chronic kidney disease: Secondary | ICD-10-CM | POA: Diagnosis not present

## 2015-07-26 DIAGNOSIS — N2581 Secondary hyperparathyroidism of renal origin: Secondary | ICD-10-CM | POA: Diagnosis not present

## 2015-07-26 DIAGNOSIS — N186 End stage renal disease: Secondary | ICD-10-CM | POA: Diagnosis not present

## 2015-07-26 DIAGNOSIS — Z992 Dependence on renal dialysis: Secondary | ICD-10-CM | POA: Diagnosis not present

## 2015-07-28 DIAGNOSIS — Z992 Dependence on renal dialysis: Secondary | ICD-10-CM | POA: Diagnosis not present

## 2015-07-28 DIAGNOSIS — D631 Anemia in chronic kidney disease: Secondary | ICD-10-CM | POA: Diagnosis not present

## 2015-07-28 DIAGNOSIS — E162 Hypoglycemia, unspecified: Secondary | ICD-10-CM | POA: Diagnosis not present

## 2015-07-28 DIAGNOSIS — N2581 Secondary hyperparathyroidism of renal origin: Secondary | ICD-10-CM | POA: Diagnosis not present

## 2015-07-28 DIAGNOSIS — D509 Iron deficiency anemia, unspecified: Secondary | ICD-10-CM | POA: Diagnosis not present

## 2015-07-28 DIAGNOSIS — N186 End stage renal disease: Secondary | ICD-10-CM | POA: Diagnosis not present

## 2015-07-31 DIAGNOSIS — E162 Hypoglycemia, unspecified: Secondary | ICD-10-CM | POA: Diagnosis not present

## 2015-07-31 DIAGNOSIS — D631 Anemia in chronic kidney disease: Secondary | ICD-10-CM | POA: Diagnosis not present

## 2015-07-31 DIAGNOSIS — D509 Iron deficiency anemia, unspecified: Secondary | ICD-10-CM | POA: Diagnosis not present

## 2015-07-31 DIAGNOSIS — Z992 Dependence on renal dialysis: Secondary | ICD-10-CM | POA: Diagnosis not present

## 2015-07-31 DIAGNOSIS — N186 End stage renal disease: Secondary | ICD-10-CM | POA: Diagnosis not present

## 2015-07-31 DIAGNOSIS — N2581 Secondary hyperparathyroidism of renal origin: Secondary | ICD-10-CM | POA: Diagnosis not present

## 2015-08-02 DIAGNOSIS — D631 Anemia in chronic kidney disease: Secondary | ICD-10-CM | POA: Diagnosis not present

## 2015-08-02 DIAGNOSIS — E162 Hypoglycemia, unspecified: Secondary | ICD-10-CM | POA: Diagnosis not present

## 2015-08-02 DIAGNOSIS — D509 Iron deficiency anemia, unspecified: Secondary | ICD-10-CM | POA: Diagnosis not present

## 2015-08-02 DIAGNOSIS — Z992 Dependence on renal dialysis: Secondary | ICD-10-CM | POA: Diagnosis not present

## 2015-08-02 DIAGNOSIS — N2581 Secondary hyperparathyroidism of renal origin: Secondary | ICD-10-CM | POA: Diagnosis not present

## 2015-08-02 DIAGNOSIS — N186 End stage renal disease: Secondary | ICD-10-CM | POA: Diagnosis not present

## 2015-08-04 DIAGNOSIS — E162 Hypoglycemia, unspecified: Secondary | ICD-10-CM | POA: Diagnosis not present

## 2015-08-04 DIAGNOSIS — D631 Anemia in chronic kidney disease: Secondary | ICD-10-CM | POA: Diagnosis not present

## 2015-08-04 DIAGNOSIS — Z992 Dependence on renal dialysis: Secondary | ICD-10-CM | POA: Diagnosis not present

## 2015-08-04 DIAGNOSIS — N2581 Secondary hyperparathyroidism of renal origin: Secondary | ICD-10-CM | POA: Diagnosis not present

## 2015-08-04 DIAGNOSIS — N186 End stage renal disease: Secondary | ICD-10-CM | POA: Diagnosis not present

## 2015-08-04 DIAGNOSIS — D509 Iron deficiency anemia, unspecified: Secondary | ICD-10-CM | POA: Diagnosis not present

## 2015-08-07 DIAGNOSIS — E162 Hypoglycemia, unspecified: Secondary | ICD-10-CM | POA: Diagnosis not present

## 2015-08-07 DIAGNOSIS — Z992 Dependence on renal dialysis: Secondary | ICD-10-CM | POA: Diagnosis not present

## 2015-08-07 DIAGNOSIS — D631 Anemia in chronic kidney disease: Secondary | ICD-10-CM | POA: Diagnosis not present

## 2015-08-07 DIAGNOSIS — D509 Iron deficiency anemia, unspecified: Secondary | ICD-10-CM | POA: Diagnosis not present

## 2015-08-07 DIAGNOSIS — N2581 Secondary hyperparathyroidism of renal origin: Secondary | ICD-10-CM | POA: Diagnosis not present

## 2015-08-07 DIAGNOSIS — N186 End stage renal disease: Secondary | ICD-10-CM | POA: Diagnosis not present

## 2015-08-09 DIAGNOSIS — D631 Anemia in chronic kidney disease: Secondary | ICD-10-CM | POA: Diagnosis not present

## 2015-08-09 DIAGNOSIS — N186 End stage renal disease: Secondary | ICD-10-CM | POA: Diagnosis not present

## 2015-08-09 DIAGNOSIS — N2581 Secondary hyperparathyroidism of renal origin: Secondary | ICD-10-CM | POA: Diagnosis not present

## 2015-08-09 DIAGNOSIS — Z992 Dependence on renal dialysis: Secondary | ICD-10-CM | POA: Diagnosis not present

## 2015-08-09 DIAGNOSIS — E162 Hypoglycemia, unspecified: Secondary | ICD-10-CM | POA: Diagnosis not present

## 2015-08-09 DIAGNOSIS — D509 Iron deficiency anemia, unspecified: Secondary | ICD-10-CM | POA: Diagnosis not present

## 2015-08-10 DIAGNOSIS — N186 End stage renal disease: Secondary | ICD-10-CM | POA: Diagnosis not present

## 2015-08-10 DIAGNOSIS — D649 Anemia, unspecified: Secondary | ICD-10-CM | POA: Diagnosis not present

## 2015-08-10 DIAGNOSIS — M109 Gout, unspecified: Secondary | ICD-10-CM | POA: Diagnosis not present

## 2015-08-10 DIAGNOSIS — I10 Essential (primary) hypertension: Secondary | ICD-10-CM | POA: Diagnosis not present

## 2015-08-11 DIAGNOSIS — D509 Iron deficiency anemia, unspecified: Secondary | ICD-10-CM | POA: Diagnosis not present

## 2015-08-11 DIAGNOSIS — Z992 Dependence on renal dialysis: Secondary | ICD-10-CM | POA: Diagnosis not present

## 2015-08-11 DIAGNOSIS — D631 Anemia in chronic kidney disease: Secondary | ICD-10-CM | POA: Diagnosis not present

## 2015-08-11 DIAGNOSIS — E162 Hypoglycemia, unspecified: Secondary | ICD-10-CM | POA: Diagnosis not present

## 2015-08-11 DIAGNOSIS — N186 End stage renal disease: Secondary | ICD-10-CM | POA: Diagnosis not present

## 2015-08-11 DIAGNOSIS — N2581 Secondary hyperparathyroidism of renal origin: Secondary | ICD-10-CM | POA: Diagnosis not present

## 2015-08-14 DIAGNOSIS — E119 Type 2 diabetes mellitus without complications: Secondary | ICD-10-CM | POA: Diagnosis not present

## 2015-08-14 DIAGNOSIS — E162 Hypoglycemia, unspecified: Secondary | ICD-10-CM | POA: Diagnosis not present

## 2015-08-14 DIAGNOSIS — D631 Anemia in chronic kidney disease: Secondary | ICD-10-CM | POA: Diagnosis not present

## 2015-08-14 DIAGNOSIS — Z992 Dependence on renal dialysis: Secondary | ICD-10-CM | POA: Diagnosis not present

## 2015-08-14 DIAGNOSIS — D509 Iron deficiency anemia, unspecified: Secondary | ICD-10-CM | POA: Diagnosis not present

## 2015-08-14 DIAGNOSIS — N2581 Secondary hyperparathyroidism of renal origin: Secondary | ICD-10-CM | POA: Diagnosis not present

## 2015-08-14 DIAGNOSIS — N186 End stage renal disease: Secondary | ICD-10-CM | POA: Diagnosis not present

## 2015-08-16 DIAGNOSIS — D509 Iron deficiency anemia, unspecified: Secondary | ICD-10-CM | POA: Diagnosis not present

## 2015-08-16 DIAGNOSIS — N2581 Secondary hyperparathyroidism of renal origin: Secondary | ICD-10-CM | POA: Diagnosis not present

## 2015-08-16 DIAGNOSIS — N186 End stage renal disease: Secondary | ICD-10-CM | POA: Diagnosis not present

## 2015-08-16 DIAGNOSIS — D631 Anemia in chronic kidney disease: Secondary | ICD-10-CM | POA: Diagnosis not present

## 2015-08-16 DIAGNOSIS — E162 Hypoglycemia, unspecified: Secondary | ICD-10-CM | POA: Diagnosis not present

## 2015-08-16 DIAGNOSIS — Z992 Dependence on renal dialysis: Secondary | ICD-10-CM | POA: Diagnosis not present

## 2015-08-18 DIAGNOSIS — N186 End stage renal disease: Secondary | ICD-10-CM | POA: Diagnosis not present

## 2015-08-18 DIAGNOSIS — D631 Anemia in chronic kidney disease: Secondary | ICD-10-CM | POA: Diagnosis not present

## 2015-08-18 DIAGNOSIS — D509 Iron deficiency anemia, unspecified: Secondary | ICD-10-CM | POA: Diagnosis not present

## 2015-08-18 DIAGNOSIS — E162 Hypoglycemia, unspecified: Secondary | ICD-10-CM | POA: Diagnosis not present

## 2015-08-18 DIAGNOSIS — N2581 Secondary hyperparathyroidism of renal origin: Secondary | ICD-10-CM | POA: Diagnosis not present

## 2015-08-18 DIAGNOSIS — Z992 Dependence on renal dialysis: Secondary | ICD-10-CM | POA: Diagnosis not present

## 2015-08-21 DIAGNOSIS — D631 Anemia in chronic kidney disease: Secondary | ICD-10-CM | POA: Diagnosis not present

## 2015-08-21 DIAGNOSIS — N2581 Secondary hyperparathyroidism of renal origin: Secondary | ICD-10-CM | POA: Diagnosis not present

## 2015-08-21 DIAGNOSIS — E162 Hypoglycemia, unspecified: Secondary | ICD-10-CM | POA: Diagnosis not present

## 2015-08-21 DIAGNOSIS — D509 Iron deficiency anemia, unspecified: Secondary | ICD-10-CM | POA: Diagnosis not present

## 2015-08-21 DIAGNOSIS — E11649 Type 2 diabetes mellitus with hypoglycemia without coma: Secondary | ICD-10-CM | POA: Diagnosis not present

## 2015-08-21 DIAGNOSIS — N186 End stage renal disease: Secondary | ICD-10-CM | POA: Diagnosis not present

## 2015-08-21 DIAGNOSIS — Z992 Dependence on renal dialysis: Secondary | ICD-10-CM | POA: Diagnosis not present

## 2015-08-23 DIAGNOSIS — N186 End stage renal disease: Secondary | ICD-10-CM | POA: Diagnosis not present

## 2015-08-23 DIAGNOSIS — N2581 Secondary hyperparathyroidism of renal origin: Secondary | ICD-10-CM | POA: Diagnosis not present

## 2015-08-23 DIAGNOSIS — D631 Anemia in chronic kidney disease: Secondary | ICD-10-CM | POA: Diagnosis not present

## 2015-08-23 DIAGNOSIS — E162 Hypoglycemia, unspecified: Secondary | ICD-10-CM | POA: Diagnosis not present

## 2015-08-23 DIAGNOSIS — Z992 Dependence on renal dialysis: Secondary | ICD-10-CM | POA: Diagnosis not present

## 2015-08-23 DIAGNOSIS — D509 Iron deficiency anemia, unspecified: Secondary | ICD-10-CM | POA: Diagnosis not present

## 2015-08-28 DIAGNOSIS — N186 End stage renal disease: Secondary | ICD-10-CM | POA: Diagnosis not present

## 2015-08-28 DIAGNOSIS — D509 Iron deficiency anemia, unspecified: Secondary | ICD-10-CM | POA: Diagnosis not present

## 2015-08-28 DIAGNOSIS — Z992 Dependence on renal dialysis: Secondary | ICD-10-CM | POA: Diagnosis not present

## 2015-08-28 DIAGNOSIS — N2581 Secondary hyperparathyroidism of renal origin: Secondary | ICD-10-CM | POA: Diagnosis not present

## 2015-08-28 DIAGNOSIS — E162 Hypoglycemia, unspecified: Secondary | ICD-10-CM | POA: Diagnosis not present

## 2015-08-28 DIAGNOSIS — D631 Anemia in chronic kidney disease: Secondary | ICD-10-CM | POA: Diagnosis not present

## 2015-08-30 DIAGNOSIS — N2581 Secondary hyperparathyroidism of renal origin: Secondary | ICD-10-CM | POA: Diagnosis not present

## 2015-08-30 DIAGNOSIS — Z992 Dependence on renal dialysis: Secondary | ICD-10-CM | POA: Diagnosis not present

## 2015-08-30 DIAGNOSIS — E162 Hypoglycemia, unspecified: Secondary | ICD-10-CM | POA: Diagnosis not present

## 2015-08-30 DIAGNOSIS — D509 Iron deficiency anemia, unspecified: Secondary | ICD-10-CM | POA: Diagnosis not present

## 2015-08-30 DIAGNOSIS — N186 End stage renal disease: Secondary | ICD-10-CM | POA: Diagnosis not present

## 2015-08-30 DIAGNOSIS — D631 Anemia in chronic kidney disease: Secondary | ICD-10-CM | POA: Diagnosis not present

## 2015-09-01 DIAGNOSIS — D631 Anemia in chronic kidney disease: Secondary | ICD-10-CM | POA: Diagnosis not present

## 2015-09-01 DIAGNOSIS — Z992 Dependence on renal dialysis: Secondary | ICD-10-CM | POA: Diagnosis not present

## 2015-09-01 DIAGNOSIS — D509 Iron deficiency anemia, unspecified: Secondary | ICD-10-CM | POA: Diagnosis not present

## 2015-09-01 DIAGNOSIS — E162 Hypoglycemia, unspecified: Secondary | ICD-10-CM | POA: Diagnosis not present

## 2015-09-01 DIAGNOSIS — N2581 Secondary hyperparathyroidism of renal origin: Secondary | ICD-10-CM | POA: Diagnosis not present

## 2015-09-01 DIAGNOSIS — N186 End stage renal disease: Secondary | ICD-10-CM | POA: Diagnosis not present

## 2015-09-04 DIAGNOSIS — D631 Anemia in chronic kidney disease: Secondary | ICD-10-CM | POA: Diagnosis not present

## 2015-09-04 DIAGNOSIS — Z992 Dependence on renal dialysis: Secondary | ICD-10-CM | POA: Diagnosis not present

## 2015-09-04 DIAGNOSIS — D509 Iron deficiency anemia, unspecified: Secondary | ICD-10-CM | POA: Diagnosis not present

## 2015-09-04 DIAGNOSIS — N186 End stage renal disease: Secondary | ICD-10-CM | POA: Diagnosis not present

## 2015-09-04 DIAGNOSIS — E162 Hypoglycemia, unspecified: Secondary | ICD-10-CM | POA: Diagnosis not present

## 2015-09-04 DIAGNOSIS — N2581 Secondary hyperparathyroidism of renal origin: Secondary | ICD-10-CM | POA: Diagnosis not present

## 2015-09-06 DIAGNOSIS — Z992 Dependence on renal dialysis: Secondary | ICD-10-CM | POA: Diagnosis not present

## 2015-09-06 DIAGNOSIS — N2581 Secondary hyperparathyroidism of renal origin: Secondary | ICD-10-CM | POA: Diagnosis not present

## 2015-09-06 DIAGNOSIS — N186 End stage renal disease: Secondary | ICD-10-CM | POA: Diagnosis not present

## 2015-09-06 DIAGNOSIS — E162 Hypoglycemia, unspecified: Secondary | ICD-10-CM | POA: Diagnosis not present

## 2015-09-06 DIAGNOSIS — D631 Anemia in chronic kidney disease: Secondary | ICD-10-CM | POA: Diagnosis not present

## 2015-09-06 DIAGNOSIS — D509 Iron deficiency anemia, unspecified: Secondary | ICD-10-CM | POA: Diagnosis not present

## 2015-09-08 DIAGNOSIS — Z992 Dependence on renal dialysis: Secondary | ICD-10-CM | POA: Diagnosis not present

## 2015-09-08 DIAGNOSIS — N186 End stage renal disease: Secondary | ICD-10-CM | POA: Diagnosis not present

## 2015-09-08 DIAGNOSIS — D631 Anemia in chronic kidney disease: Secondary | ICD-10-CM | POA: Diagnosis not present

## 2015-09-08 DIAGNOSIS — D509 Iron deficiency anemia, unspecified: Secondary | ICD-10-CM | POA: Diagnosis not present

## 2015-09-08 DIAGNOSIS — N2581 Secondary hyperparathyroidism of renal origin: Secondary | ICD-10-CM | POA: Diagnosis not present

## 2015-09-08 DIAGNOSIS — E162 Hypoglycemia, unspecified: Secondary | ICD-10-CM | POA: Diagnosis not present

## 2015-09-11 DIAGNOSIS — N186 End stage renal disease: Secondary | ICD-10-CM | POA: Diagnosis not present

## 2015-09-11 DIAGNOSIS — N2581 Secondary hyperparathyroidism of renal origin: Secondary | ICD-10-CM | POA: Diagnosis not present

## 2015-09-11 DIAGNOSIS — D509 Iron deficiency anemia, unspecified: Secondary | ICD-10-CM | POA: Diagnosis not present

## 2015-09-11 DIAGNOSIS — Z992 Dependence on renal dialysis: Secondary | ICD-10-CM | POA: Diagnosis not present

## 2015-09-11 DIAGNOSIS — E162 Hypoglycemia, unspecified: Secondary | ICD-10-CM | POA: Diagnosis not present

## 2015-09-11 DIAGNOSIS — D631 Anemia in chronic kidney disease: Secondary | ICD-10-CM | POA: Diagnosis not present

## 2015-09-13 DIAGNOSIS — D631 Anemia in chronic kidney disease: Secondary | ICD-10-CM | POA: Diagnosis not present

## 2015-09-13 DIAGNOSIS — N2581 Secondary hyperparathyroidism of renal origin: Secondary | ICD-10-CM | POA: Diagnosis not present

## 2015-09-13 DIAGNOSIS — E162 Hypoglycemia, unspecified: Secondary | ICD-10-CM | POA: Diagnosis not present

## 2015-09-13 DIAGNOSIS — N186 End stage renal disease: Secondary | ICD-10-CM | POA: Diagnosis not present

## 2015-09-13 DIAGNOSIS — D509 Iron deficiency anemia, unspecified: Secondary | ICD-10-CM | POA: Diagnosis not present

## 2015-09-13 DIAGNOSIS — Z992 Dependence on renal dialysis: Secondary | ICD-10-CM | POA: Diagnosis not present

## 2015-09-15 DIAGNOSIS — N2581 Secondary hyperparathyroidism of renal origin: Secondary | ICD-10-CM | POA: Diagnosis not present

## 2015-09-15 DIAGNOSIS — D631 Anemia in chronic kidney disease: Secondary | ICD-10-CM | POA: Diagnosis not present

## 2015-09-15 DIAGNOSIS — Z992 Dependence on renal dialysis: Secondary | ICD-10-CM | POA: Diagnosis not present

## 2015-09-15 DIAGNOSIS — N186 End stage renal disease: Secondary | ICD-10-CM | POA: Diagnosis not present

## 2015-09-15 DIAGNOSIS — D509 Iron deficiency anemia, unspecified: Secondary | ICD-10-CM | POA: Diagnosis not present

## 2015-09-15 DIAGNOSIS — E162 Hypoglycemia, unspecified: Secondary | ICD-10-CM | POA: Diagnosis not present

## 2015-09-18 DIAGNOSIS — D509 Iron deficiency anemia, unspecified: Secondary | ICD-10-CM | POA: Diagnosis not present

## 2015-09-18 DIAGNOSIS — N186 End stage renal disease: Secondary | ICD-10-CM | POA: Diagnosis not present

## 2015-09-18 DIAGNOSIS — N2581 Secondary hyperparathyroidism of renal origin: Secondary | ICD-10-CM | POA: Diagnosis not present

## 2015-09-18 DIAGNOSIS — E162 Hypoglycemia, unspecified: Secondary | ICD-10-CM | POA: Diagnosis not present

## 2015-09-18 DIAGNOSIS — D631 Anemia in chronic kidney disease: Secondary | ICD-10-CM | POA: Diagnosis not present

## 2015-09-18 DIAGNOSIS — Z992 Dependence on renal dialysis: Secondary | ICD-10-CM | POA: Diagnosis not present

## 2015-09-20 DIAGNOSIS — D509 Iron deficiency anemia, unspecified: Secondary | ICD-10-CM | POA: Diagnosis not present

## 2015-09-20 DIAGNOSIS — E162 Hypoglycemia, unspecified: Secondary | ICD-10-CM | POA: Diagnosis not present

## 2015-09-20 DIAGNOSIS — N2581 Secondary hyperparathyroidism of renal origin: Secondary | ICD-10-CM | POA: Diagnosis not present

## 2015-09-20 DIAGNOSIS — E11649 Type 2 diabetes mellitus with hypoglycemia without coma: Secondary | ICD-10-CM | POA: Diagnosis not present

## 2015-09-20 DIAGNOSIS — Z992 Dependence on renal dialysis: Secondary | ICD-10-CM | POA: Diagnosis not present

## 2015-09-20 DIAGNOSIS — D631 Anemia in chronic kidney disease: Secondary | ICD-10-CM | POA: Diagnosis not present

## 2015-09-20 DIAGNOSIS — N186 End stage renal disease: Secondary | ICD-10-CM | POA: Diagnosis not present

## 2015-09-22 DIAGNOSIS — Z992 Dependence on renal dialysis: Secondary | ICD-10-CM | POA: Diagnosis not present

## 2015-09-22 DIAGNOSIS — D509 Iron deficiency anemia, unspecified: Secondary | ICD-10-CM | POA: Diagnosis not present

## 2015-09-22 DIAGNOSIS — D631 Anemia in chronic kidney disease: Secondary | ICD-10-CM | POA: Diagnosis not present

## 2015-09-22 DIAGNOSIS — N2581 Secondary hyperparathyroidism of renal origin: Secondary | ICD-10-CM | POA: Diagnosis not present

## 2015-09-22 DIAGNOSIS — E162 Hypoglycemia, unspecified: Secondary | ICD-10-CM | POA: Diagnosis not present

## 2015-09-22 DIAGNOSIS — N186 End stage renal disease: Secondary | ICD-10-CM | POA: Diagnosis not present

## 2015-09-25 DIAGNOSIS — D631 Anemia in chronic kidney disease: Secondary | ICD-10-CM | POA: Diagnosis not present

## 2015-09-25 DIAGNOSIS — D509 Iron deficiency anemia, unspecified: Secondary | ICD-10-CM | POA: Diagnosis not present

## 2015-09-25 DIAGNOSIS — Z992 Dependence on renal dialysis: Secondary | ICD-10-CM | POA: Diagnosis not present

## 2015-09-25 DIAGNOSIS — N2581 Secondary hyperparathyroidism of renal origin: Secondary | ICD-10-CM | POA: Diagnosis not present

## 2015-09-25 DIAGNOSIS — N186 End stage renal disease: Secondary | ICD-10-CM | POA: Diagnosis not present

## 2015-09-25 DIAGNOSIS — E162 Hypoglycemia, unspecified: Secondary | ICD-10-CM | POA: Diagnosis not present

## 2015-09-27 DIAGNOSIS — Z992 Dependence on renal dialysis: Secondary | ICD-10-CM | POA: Diagnosis not present

## 2015-09-27 DIAGNOSIS — D509 Iron deficiency anemia, unspecified: Secondary | ICD-10-CM | POA: Diagnosis not present

## 2015-09-27 DIAGNOSIS — E162 Hypoglycemia, unspecified: Secondary | ICD-10-CM | POA: Diagnosis not present

## 2015-09-27 DIAGNOSIS — N186 End stage renal disease: Secondary | ICD-10-CM | POA: Diagnosis not present

## 2015-09-27 DIAGNOSIS — D631 Anemia in chronic kidney disease: Secondary | ICD-10-CM | POA: Diagnosis not present

## 2015-09-27 DIAGNOSIS — N2581 Secondary hyperparathyroidism of renal origin: Secondary | ICD-10-CM | POA: Diagnosis not present

## 2015-09-29 DIAGNOSIS — D509 Iron deficiency anemia, unspecified: Secondary | ICD-10-CM | POA: Diagnosis not present

## 2015-09-29 DIAGNOSIS — E162 Hypoglycemia, unspecified: Secondary | ICD-10-CM | POA: Diagnosis not present

## 2015-09-29 DIAGNOSIS — Z992 Dependence on renal dialysis: Secondary | ICD-10-CM | POA: Diagnosis not present

## 2015-09-29 DIAGNOSIS — N2581 Secondary hyperparathyroidism of renal origin: Secondary | ICD-10-CM | POA: Diagnosis not present

## 2015-09-29 DIAGNOSIS — D631 Anemia in chronic kidney disease: Secondary | ICD-10-CM | POA: Diagnosis not present

## 2015-09-29 DIAGNOSIS — N186 End stage renal disease: Secondary | ICD-10-CM | POA: Diagnosis not present

## 2015-10-02 DIAGNOSIS — D509 Iron deficiency anemia, unspecified: Secondary | ICD-10-CM | POA: Diagnosis not present

## 2015-10-02 DIAGNOSIS — N186 End stage renal disease: Secondary | ICD-10-CM | POA: Diagnosis not present

## 2015-10-02 DIAGNOSIS — Z992 Dependence on renal dialysis: Secondary | ICD-10-CM | POA: Diagnosis not present

## 2015-10-02 DIAGNOSIS — D631 Anemia in chronic kidney disease: Secondary | ICD-10-CM | POA: Diagnosis not present

## 2015-10-02 DIAGNOSIS — N2581 Secondary hyperparathyroidism of renal origin: Secondary | ICD-10-CM | POA: Diagnosis not present

## 2015-10-02 DIAGNOSIS — E162 Hypoglycemia, unspecified: Secondary | ICD-10-CM | POA: Diagnosis not present

## 2015-10-04 DIAGNOSIS — N2581 Secondary hyperparathyroidism of renal origin: Secondary | ICD-10-CM | POA: Diagnosis not present

## 2015-10-04 DIAGNOSIS — N186 End stage renal disease: Secondary | ICD-10-CM | POA: Diagnosis not present

## 2015-10-04 DIAGNOSIS — Z992 Dependence on renal dialysis: Secondary | ICD-10-CM | POA: Diagnosis not present

## 2015-10-04 DIAGNOSIS — D631 Anemia in chronic kidney disease: Secondary | ICD-10-CM | POA: Diagnosis not present

## 2015-10-04 DIAGNOSIS — E162 Hypoglycemia, unspecified: Secondary | ICD-10-CM | POA: Diagnosis not present

## 2015-10-04 DIAGNOSIS — D509 Iron deficiency anemia, unspecified: Secondary | ICD-10-CM | POA: Diagnosis not present

## 2015-10-06 DIAGNOSIS — D631 Anemia in chronic kidney disease: Secondary | ICD-10-CM | POA: Diagnosis not present

## 2015-10-06 DIAGNOSIS — N186 End stage renal disease: Secondary | ICD-10-CM | POA: Diagnosis not present

## 2015-10-06 DIAGNOSIS — D509 Iron deficiency anemia, unspecified: Secondary | ICD-10-CM | POA: Diagnosis not present

## 2015-10-06 DIAGNOSIS — Z992 Dependence on renal dialysis: Secondary | ICD-10-CM | POA: Diagnosis not present

## 2015-10-06 DIAGNOSIS — N2581 Secondary hyperparathyroidism of renal origin: Secondary | ICD-10-CM | POA: Diagnosis not present

## 2015-10-06 DIAGNOSIS — E162 Hypoglycemia, unspecified: Secondary | ICD-10-CM | POA: Diagnosis not present

## 2015-10-09 DIAGNOSIS — N186 End stage renal disease: Secondary | ICD-10-CM | POA: Diagnosis not present

## 2015-10-09 DIAGNOSIS — Z992 Dependence on renal dialysis: Secondary | ICD-10-CM | POA: Diagnosis not present

## 2015-10-09 DIAGNOSIS — D509 Iron deficiency anemia, unspecified: Secondary | ICD-10-CM | POA: Diagnosis not present

## 2015-10-09 DIAGNOSIS — D631 Anemia in chronic kidney disease: Secondary | ICD-10-CM | POA: Diagnosis not present

## 2015-10-09 DIAGNOSIS — N2581 Secondary hyperparathyroidism of renal origin: Secondary | ICD-10-CM | POA: Diagnosis not present

## 2015-10-09 DIAGNOSIS — E162 Hypoglycemia, unspecified: Secondary | ICD-10-CM | POA: Diagnosis not present

## 2015-10-11 DIAGNOSIS — Z992 Dependence on renal dialysis: Secondary | ICD-10-CM | POA: Diagnosis not present

## 2015-10-11 DIAGNOSIS — E162 Hypoglycemia, unspecified: Secondary | ICD-10-CM | POA: Diagnosis not present

## 2015-10-11 DIAGNOSIS — D509 Iron deficiency anemia, unspecified: Secondary | ICD-10-CM | POA: Diagnosis not present

## 2015-10-11 DIAGNOSIS — N186 End stage renal disease: Secondary | ICD-10-CM | POA: Diagnosis not present

## 2015-10-11 DIAGNOSIS — D631 Anemia in chronic kidney disease: Secondary | ICD-10-CM | POA: Diagnosis not present

## 2015-10-11 DIAGNOSIS — N2581 Secondary hyperparathyroidism of renal origin: Secondary | ICD-10-CM | POA: Diagnosis not present

## 2015-10-13 DIAGNOSIS — Z992 Dependence on renal dialysis: Secondary | ICD-10-CM | POA: Diagnosis not present

## 2015-10-13 DIAGNOSIS — E162 Hypoglycemia, unspecified: Secondary | ICD-10-CM | POA: Diagnosis not present

## 2015-10-13 DIAGNOSIS — D509 Iron deficiency anemia, unspecified: Secondary | ICD-10-CM | POA: Diagnosis not present

## 2015-10-13 DIAGNOSIS — D631 Anemia in chronic kidney disease: Secondary | ICD-10-CM | POA: Diagnosis not present

## 2015-10-13 DIAGNOSIS — N186 End stage renal disease: Secondary | ICD-10-CM | POA: Diagnosis not present

## 2015-10-13 DIAGNOSIS — N2581 Secondary hyperparathyroidism of renal origin: Secondary | ICD-10-CM | POA: Diagnosis not present

## 2015-10-16 DIAGNOSIS — N186 End stage renal disease: Secondary | ICD-10-CM | POA: Diagnosis not present

## 2015-10-16 DIAGNOSIS — Z992 Dependence on renal dialysis: Secondary | ICD-10-CM | POA: Diagnosis not present

## 2015-10-16 DIAGNOSIS — E162 Hypoglycemia, unspecified: Secondary | ICD-10-CM | POA: Diagnosis not present

## 2015-10-16 DIAGNOSIS — D509 Iron deficiency anemia, unspecified: Secondary | ICD-10-CM | POA: Diagnosis not present

## 2015-10-16 DIAGNOSIS — N2581 Secondary hyperparathyroidism of renal origin: Secondary | ICD-10-CM | POA: Diagnosis not present

## 2015-10-16 DIAGNOSIS — D631 Anemia in chronic kidney disease: Secondary | ICD-10-CM | POA: Diagnosis not present

## 2015-10-18 DIAGNOSIS — E11649 Type 2 diabetes mellitus with hypoglycemia without coma: Secondary | ICD-10-CM | POA: Diagnosis not present

## 2015-10-18 DIAGNOSIS — N186 End stage renal disease: Secondary | ICD-10-CM | POA: Diagnosis not present

## 2015-10-18 DIAGNOSIS — Z992 Dependence on renal dialysis: Secondary | ICD-10-CM | POA: Diagnosis not present

## 2015-10-18 DIAGNOSIS — D509 Iron deficiency anemia, unspecified: Secondary | ICD-10-CM | POA: Diagnosis not present

## 2015-10-18 DIAGNOSIS — D631 Anemia in chronic kidney disease: Secondary | ICD-10-CM | POA: Diagnosis not present

## 2015-10-18 DIAGNOSIS — E162 Hypoglycemia, unspecified: Secondary | ICD-10-CM | POA: Diagnosis not present

## 2015-10-18 DIAGNOSIS — N2581 Secondary hyperparathyroidism of renal origin: Secondary | ICD-10-CM | POA: Diagnosis not present

## 2015-10-20 DIAGNOSIS — N2581 Secondary hyperparathyroidism of renal origin: Secondary | ICD-10-CM | POA: Diagnosis not present

## 2015-10-20 DIAGNOSIS — D631 Anemia in chronic kidney disease: Secondary | ICD-10-CM | POA: Diagnosis not present

## 2015-10-20 DIAGNOSIS — Z992 Dependence on renal dialysis: Secondary | ICD-10-CM | POA: Diagnosis not present

## 2015-10-20 DIAGNOSIS — N186 End stage renal disease: Secondary | ICD-10-CM | POA: Diagnosis not present

## 2015-10-20 DIAGNOSIS — D509 Iron deficiency anemia, unspecified: Secondary | ICD-10-CM | POA: Diagnosis not present

## 2015-10-20 DIAGNOSIS — E162 Hypoglycemia, unspecified: Secondary | ICD-10-CM | POA: Diagnosis not present

## 2015-10-23 DIAGNOSIS — N2581 Secondary hyperparathyroidism of renal origin: Secondary | ICD-10-CM | POA: Diagnosis not present

## 2015-10-23 DIAGNOSIS — N186 End stage renal disease: Secondary | ICD-10-CM | POA: Diagnosis not present

## 2015-10-23 DIAGNOSIS — E162 Hypoglycemia, unspecified: Secondary | ICD-10-CM | POA: Diagnosis not present

## 2015-10-23 DIAGNOSIS — Z992 Dependence on renal dialysis: Secondary | ICD-10-CM | POA: Diagnosis not present

## 2015-10-23 DIAGNOSIS — D509 Iron deficiency anemia, unspecified: Secondary | ICD-10-CM | POA: Diagnosis not present

## 2015-10-23 DIAGNOSIS — D631 Anemia in chronic kidney disease: Secondary | ICD-10-CM | POA: Diagnosis not present

## 2015-10-25 DIAGNOSIS — D631 Anemia in chronic kidney disease: Secondary | ICD-10-CM | POA: Diagnosis not present

## 2015-10-25 DIAGNOSIS — Z992 Dependence on renal dialysis: Secondary | ICD-10-CM | POA: Diagnosis not present

## 2015-10-25 DIAGNOSIS — D509 Iron deficiency anemia, unspecified: Secondary | ICD-10-CM | POA: Diagnosis not present

## 2015-10-25 DIAGNOSIS — N186 End stage renal disease: Secondary | ICD-10-CM | POA: Diagnosis not present

## 2015-10-25 DIAGNOSIS — E162 Hypoglycemia, unspecified: Secondary | ICD-10-CM | POA: Diagnosis not present

## 2015-10-25 DIAGNOSIS — N2581 Secondary hyperparathyroidism of renal origin: Secondary | ICD-10-CM | POA: Diagnosis not present

## 2015-10-27 DIAGNOSIS — N186 End stage renal disease: Secondary | ICD-10-CM | POA: Diagnosis not present

## 2015-10-27 DIAGNOSIS — N2581 Secondary hyperparathyroidism of renal origin: Secondary | ICD-10-CM | POA: Diagnosis not present

## 2015-10-27 DIAGNOSIS — Z992 Dependence on renal dialysis: Secondary | ICD-10-CM | POA: Diagnosis not present

## 2015-10-27 DIAGNOSIS — E162 Hypoglycemia, unspecified: Secondary | ICD-10-CM | POA: Diagnosis not present

## 2015-10-27 DIAGNOSIS — D509 Iron deficiency anemia, unspecified: Secondary | ICD-10-CM | POA: Diagnosis not present

## 2015-10-27 DIAGNOSIS — D631 Anemia in chronic kidney disease: Secondary | ICD-10-CM | POA: Diagnosis not present

## 2015-10-30 DIAGNOSIS — Z992 Dependence on renal dialysis: Secondary | ICD-10-CM | POA: Diagnosis not present

## 2015-10-30 DIAGNOSIS — N186 End stage renal disease: Secondary | ICD-10-CM | POA: Diagnosis not present

## 2015-10-30 DIAGNOSIS — N2581 Secondary hyperparathyroidism of renal origin: Secondary | ICD-10-CM | POA: Diagnosis not present

## 2015-10-30 DIAGNOSIS — D509 Iron deficiency anemia, unspecified: Secondary | ICD-10-CM | POA: Diagnosis not present

## 2015-10-30 DIAGNOSIS — E162 Hypoglycemia, unspecified: Secondary | ICD-10-CM | POA: Diagnosis not present

## 2015-10-30 DIAGNOSIS — D631 Anemia in chronic kidney disease: Secondary | ICD-10-CM | POA: Diagnosis not present

## 2015-11-01 DIAGNOSIS — D509 Iron deficiency anemia, unspecified: Secondary | ICD-10-CM | POA: Diagnosis not present

## 2015-11-01 DIAGNOSIS — D631 Anemia in chronic kidney disease: Secondary | ICD-10-CM | POA: Diagnosis not present

## 2015-11-01 DIAGNOSIS — E162 Hypoglycemia, unspecified: Secondary | ICD-10-CM | POA: Diagnosis not present

## 2015-11-01 DIAGNOSIS — N186 End stage renal disease: Secondary | ICD-10-CM | POA: Diagnosis not present

## 2015-11-01 DIAGNOSIS — Z992 Dependence on renal dialysis: Secondary | ICD-10-CM | POA: Diagnosis not present

## 2015-11-01 DIAGNOSIS — N2581 Secondary hyperparathyroidism of renal origin: Secondary | ICD-10-CM | POA: Diagnosis not present

## 2015-11-03 DIAGNOSIS — N2581 Secondary hyperparathyroidism of renal origin: Secondary | ICD-10-CM | POA: Diagnosis not present

## 2015-11-03 DIAGNOSIS — N186 End stage renal disease: Secondary | ICD-10-CM | POA: Diagnosis not present

## 2015-11-03 DIAGNOSIS — E162 Hypoglycemia, unspecified: Secondary | ICD-10-CM | POA: Diagnosis not present

## 2015-11-03 DIAGNOSIS — D509 Iron deficiency anemia, unspecified: Secondary | ICD-10-CM | POA: Diagnosis not present

## 2015-11-03 DIAGNOSIS — D631 Anemia in chronic kidney disease: Secondary | ICD-10-CM | POA: Diagnosis not present

## 2015-11-03 DIAGNOSIS — Z992 Dependence on renal dialysis: Secondary | ICD-10-CM | POA: Diagnosis not present

## 2015-11-06 DIAGNOSIS — D509 Iron deficiency anemia, unspecified: Secondary | ICD-10-CM | POA: Diagnosis not present

## 2015-11-06 DIAGNOSIS — N186 End stage renal disease: Secondary | ICD-10-CM | POA: Diagnosis not present

## 2015-11-06 DIAGNOSIS — D631 Anemia in chronic kidney disease: Secondary | ICD-10-CM | POA: Diagnosis not present

## 2015-11-06 DIAGNOSIS — N2581 Secondary hyperparathyroidism of renal origin: Secondary | ICD-10-CM | POA: Diagnosis not present

## 2015-11-06 DIAGNOSIS — E162 Hypoglycemia, unspecified: Secondary | ICD-10-CM | POA: Diagnosis not present

## 2015-11-06 DIAGNOSIS — Z992 Dependence on renal dialysis: Secondary | ICD-10-CM | POA: Diagnosis not present

## 2015-11-08 DIAGNOSIS — Z992 Dependence on renal dialysis: Secondary | ICD-10-CM | POA: Diagnosis not present

## 2015-11-08 DIAGNOSIS — N2581 Secondary hyperparathyroidism of renal origin: Secondary | ICD-10-CM | POA: Diagnosis not present

## 2015-11-08 DIAGNOSIS — N186 End stage renal disease: Secondary | ICD-10-CM | POA: Diagnosis not present

## 2015-11-08 DIAGNOSIS — D509 Iron deficiency anemia, unspecified: Secondary | ICD-10-CM | POA: Diagnosis not present

## 2015-11-08 DIAGNOSIS — E162 Hypoglycemia, unspecified: Secondary | ICD-10-CM | POA: Diagnosis not present

## 2015-11-08 DIAGNOSIS — D631 Anemia in chronic kidney disease: Secondary | ICD-10-CM | POA: Diagnosis not present

## 2015-11-10 DIAGNOSIS — D509 Iron deficiency anemia, unspecified: Secondary | ICD-10-CM | POA: Diagnosis not present

## 2015-11-10 DIAGNOSIS — Z992 Dependence on renal dialysis: Secondary | ICD-10-CM | POA: Diagnosis not present

## 2015-11-10 DIAGNOSIS — D631 Anemia in chronic kidney disease: Secondary | ICD-10-CM | POA: Diagnosis not present

## 2015-11-10 DIAGNOSIS — N186 End stage renal disease: Secondary | ICD-10-CM | POA: Diagnosis not present

## 2015-11-10 DIAGNOSIS — N2581 Secondary hyperparathyroidism of renal origin: Secondary | ICD-10-CM | POA: Diagnosis not present

## 2015-11-10 DIAGNOSIS — E162 Hypoglycemia, unspecified: Secondary | ICD-10-CM | POA: Diagnosis not present

## 2015-11-13 DIAGNOSIS — Z992 Dependence on renal dialysis: Secondary | ICD-10-CM | POA: Diagnosis not present

## 2015-11-13 DIAGNOSIS — E162 Hypoglycemia, unspecified: Secondary | ICD-10-CM | POA: Diagnosis not present

## 2015-11-13 DIAGNOSIS — N2581 Secondary hyperparathyroidism of renal origin: Secondary | ICD-10-CM | POA: Diagnosis not present

## 2015-11-13 DIAGNOSIS — N186 End stage renal disease: Secondary | ICD-10-CM | POA: Diagnosis not present

## 2015-11-13 DIAGNOSIS — D631 Anemia in chronic kidney disease: Secondary | ICD-10-CM | POA: Diagnosis not present

## 2015-11-13 DIAGNOSIS — D509 Iron deficiency anemia, unspecified: Secondary | ICD-10-CM | POA: Diagnosis not present

## 2015-11-15 DIAGNOSIS — Z992 Dependence on renal dialysis: Secondary | ICD-10-CM | POA: Diagnosis not present

## 2015-11-15 DIAGNOSIS — N186 End stage renal disease: Secondary | ICD-10-CM | POA: Diagnosis not present

## 2015-11-15 DIAGNOSIS — D509 Iron deficiency anemia, unspecified: Secondary | ICD-10-CM | POA: Diagnosis not present

## 2015-11-15 DIAGNOSIS — E162 Hypoglycemia, unspecified: Secondary | ICD-10-CM | POA: Diagnosis not present

## 2015-11-15 DIAGNOSIS — D631 Anemia in chronic kidney disease: Secondary | ICD-10-CM | POA: Diagnosis not present

## 2015-11-15 DIAGNOSIS — N2581 Secondary hyperparathyroidism of renal origin: Secondary | ICD-10-CM | POA: Diagnosis not present

## 2015-11-16 DIAGNOSIS — Z992 Dependence on renal dialysis: Secondary | ICD-10-CM | POA: Diagnosis not present

## 2015-11-16 DIAGNOSIS — I10 Essential (primary) hypertension: Secondary | ICD-10-CM | POA: Diagnosis not present

## 2015-11-16 DIAGNOSIS — N186 End stage renal disease: Secondary | ICD-10-CM | POA: Diagnosis not present

## 2015-11-16 DIAGNOSIS — M109 Gout, unspecified: Secondary | ICD-10-CM | POA: Diagnosis not present

## 2015-11-17 DIAGNOSIS — D631 Anemia in chronic kidney disease: Secondary | ICD-10-CM | POA: Diagnosis not present

## 2015-11-17 DIAGNOSIS — N186 End stage renal disease: Secondary | ICD-10-CM | POA: Diagnosis not present

## 2015-11-17 DIAGNOSIS — D509 Iron deficiency anemia, unspecified: Secondary | ICD-10-CM | POA: Diagnosis not present

## 2015-11-17 DIAGNOSIS — Z992 Dependence on renal dialysis: Secondary | ICD-10-CM | POA: Diagnosis not present

## 2015-11-17 DIAGNOSIS — N2581 Secondary hyperparathyroidism of renal origin: Secondary | ICD-10-CM | POA: Diagnosis not present

## 2015-11-20 DIAGNOSIS — N186 End stage renal disease: Secondary | ICD-10-CM | POA: Diagnosis not present

## 2015-11-20 DIAGNOSIS — N2581 Secondary hyperparathyroidism of renal origin: Secondary | ICD-10-CM | POA: Diagnosis not present

## 2015-11-20 DIAGNOSIS — Z992 Dependence on renal dialysis: Secondary | ICD-10-CM | POA: Diagnosis not present

## 2015-11-20 DIAGNOSIS — D631 Anemia in chronic kidney disease: Secondary | ICD-10-CM | POA: Diagnosis not present

## 2015-11-20 DIAGNOSIS — D509 Iron deficiency anemia, unspecified: Secondary | ICD-10-CM | POA: Diagnosis not present

## 2015-11-22 DIAGNOSIS — N2581 Secondary hyperparathyroidism of renal origin: Secondary | ICD-10-CM | POA: Diagnosis not present

## 2015-11-22 DIAGNOSIS — D631 Anemia in chronic kidney disease: Secondary | ICD-10-CM | POA: Diagnosis not present

## 2015-11-22 DIAGNOSIS — N186 End stage renal disease: Secondary | ICD-10-CM | POA: Diagnosis not present

## 2015-11-22 DIAGNOSIS — D509 Iron deficiency anemia, unspecified: Secondary | ICD-10-CM | POA: Diagnosis not present

## 2015-11-22 DIAGNOSIS — Z992 Dependence on renal dialysis: Secondary | ICD-10-CM | POA: Diagnosis not present

## 2015-11-24 DIAGNOSIS — D631 Anemia in chronic kidney disease: Secondary | ICD-10-CM | POA: Diagnosis not present

## 2015-11-24 DIAGNOSIS — N2581 Secondary hyperparathyroidism of renal origin: Secondary | ICD-10-CM | POA: Diagnosis not present

## 2015-11-24 DIAGNOSIS — N186 End stage renal disease: Secondary | ICD-10-CM | POA: Diagnosis not present

## 2015-11-24 DIAGNOSIS — D509 Iron deficiency anemia, unspecified: Secondary | ICD-10-CM | POA: Diagnosis not present

## 2015-11-24 DIAGNOSIS — Z992 Dependence on renal dialysis: Secondary | ICD-10-CM | POA: Diagnosis not present

## 2015-11-27 DIAGNOSIS — D631 Anemia in chronic kidney disease: Secondary | ICD-10-CM | POA: Diagnosis not present

## 2015-11-27 DIAGNOSIS — N186 End stage renal disease: Secondary | ICD-10-CM | POA: Diagnosis not present

## 2015-11-27 DIAGNOSIS — N2581 Secondary hyperparathyroidism of renal origin: Secondary | ICD-10-CM | POA: Diagnosis not present

## 2015-11-27 DIAGNOSIS — Z992 Dependence on renal dialysis: Secondary | ICD-10-CM | POA: Diagnosis not present

## 2015-11-27 DIAGNOSIS — E119 Type 2 diabetes mellitus without complications: Secondary | ICD-10-CM | POA: Diagnosis not present

## 2015-11-27 DIAGNOSIS — D509 Iron deficiency anemia, unspecified: Secondary | ICD-10-CM | POA: Diagnosis not present

## 2015-11-29 DIAGNOSIS — D631 Anemia in chronic kidney disease: Secondary | ICD-10-CM | POA: Diagnosis not present

## 2015-11-29 DIAGNOSIS — N186 End stage renal disease: Secondary | ICD-10-CM | POA: Diagnosis not present

## 2015-11-29 DIAGNOSIS — Z992 Dependence on renal dialysis: Secondary | ICD-10-CM | POA: Diagnosis not present

## 2015-11-29 DIAGNOSIS — D509 Iron deficiency anemia, unspecified: Secondary | ICD-10-CM | POA: Diagnosis not present

## 2015-11-29 DIAGNOSIS — N2581 Secondary hyperparathyroidism of renal origin: Secondary | ICD-10-CM | POA: Diagnosis not present

## 2015-11-30 DIAGNOSIS — N2581 Secondary hyperparathyroidism of renal origin: Secondary | ICD-10-CM | POA: Diagnosis not present

## 2015-11-30 DIAGNOSIS — N186 End stage renal disease: Secondary | ICD-10-CM | POA: Diagnosis not present

## 2015-11-30 DIAGNOSIS — Z992 Dependence on renal dialysis: Secondary | ICD-10-CM | POA: Diagnosis not present

## 2015-11-30 DIAGNOSIS — D509 Iron deficiency anemia, unspecified: Secondary | ICD-10-CM | POA: Diagnosis not present

## 2015-11-30 DIAGNOSIS — D631 Anemia in chronic kidney disease: Secondary | ICD-10-CM | POA: Diagnosis not present

## 2015-12-04 DIAGNOSIS — Z992 Dependence on renal dialysis: Secondary | ICD-10-CM | POA: Diagnosis not present

## 2015-12-04 DIAGNOSIS — D631 Anemia in chronic kidney disease: Secondary | ICD-10-CM | POA: Diagnosis not present

## 2015-12-04 DIAGNOSIS — N2581 Secondary hyperparathyroidism of renal origin: Secondary | ICD-10-CM | POA: Diagnosis not present

## 2015-12-04 DIAGNOSIS — N186 End stage renal disease: Secondary | ICD-10-CM | POA: Diagnosis not present

## 2015-12-04 DIAGNOSIS — D509 Iron deficiency anemia, unspecified: Secondary | ICD-10-CM | POA: Diagnosis not present

## 2015-12-06 DIAGNOSIS — D631 Anemia in chronic kidney disease: Secondary | ICD-10-CM | POA: Diagnosis not present

## 2015-12-06 DIAGNOSIS — N186 End stage renal disease: Secondary | ICD-10-CM | POA: Diagnosis not present

## 2015-12-06 DIAGNOSIS — N2581 Secondary hyperparathyroidism of renal origin: Secondary | ICD-10-CM | POA: Diagnosis not present

## 2015-12-06 DIAGNOSIS — Z992 Dependence on renal dialysis: Secondary | ICD-10-CM | POA: Diagnosis not present

## 2015-12-06 DIAGNOSIS — D509 Iron deficiency anemia, unspecified: Secondary | ICD-10-CM | POA: Diagnosis not present

## 2015-12-08 DIAGNOSIS — Z992 Dependence on renal dialysis: Secondary | ICD-10-CM | POA: Diagnosis not present

## 2015-12-08 DIAGNOSIS — D509 Iron deficiency anemia, unspecified: Secondary | ICD-10-CM | POA: Diagnosis not present

## 2015-12-08 DIAGNOSIS — N186 End stage renal disease: Secondary | ICD-10-CM | POA: Diagnosis not present

## 2015-12-08 DIAGNOSIS — D631 Anemia in chronic kidney disease: Secondary | ICD-10-CM | POA: Diagnosis not present

## 2015-12-08 DIAGNOSIS — N2581 Secondary hyperparathyroidism of renal origin: Secondary | ICD-10-CM | POA: Diagnosis not present

## 2015-12-11 DIAGNOSIS — D631 Anemia in chronic kidney disease: Secondary | ICD-10-CM | POA: Diagnosis not present

## 2015-12-11 DIAGNOSIS — D509 Iron deficiency anemia, unspecified: Secondary | ICD-10-CM | POA: Diagnosis not present

## 2015-12-11 DIAGNOSIS — Z992 Dependence on renal dialysis: Secondary | ICD-10-CM | POA: Diagnosis not present

## 2015-12-11 DIAGNOSIS — N2581 Secondary hyperparathyroidism of renal origin: Secondary | ICD-10-CM | POA: Diagnosis not present

## 2015-12-11 DIAGNOSIS — N186 End stage renal disease: Secondary | ICD-10-CM | POA: Diagnosis not present

## 2015-12-13 DIAGNOSIS — Z992 Dependence on renal dialysis: Secondary | ICD-10-CM | POA: Diagnosis not present

## 2015-12-13 DIAGNOSIS — N186 End stage renal disease: Secondary | ICD-10-CM | POA: Diagnosis not present

## 2015-12-13 DIAGNOSIS — N2581 Secondary hyperparathyroidism of renal origin: Secondary | ICD-10-CM | POA: Diagnosis not present

## 2015-12-13 DIAGNOSIS — D631 Anemia in chronic kidney disease: Secondary | ICD-10-CM | POA: Diagnosis not present

## 2015-12-13 DIAGNOSIS — D509 Iron deficiency anemia, unspecified: Secondary | ICD-10-CM | POA: Diagnosis not present

## 2015-12-15 DIAGNOSIS — N186 End stage renal disease: Secondary | ICD-10-CM | POA: Diagnosis not present

## 2015-12-15 DIAGNOSIS — N2581 Secondary hyperparathyroidism of renal origin: Secondary | ICD-10-CM | POA: Diagnosis not present

## 2015-12-15 DIAGNOSIS — D509 Iron deficiency anemia, unspecified: Secondary | ICD-10-CM | POA: Diagnosis not present

## 2015-12-15 DIAGNOSIS — D631 Anemia in chronic kidney disease: Secondary | ICD-10-CM | POA: Diagnosis not present

## 2015-12-15 DIAGNOSIS — Z992 Dependence on renal dialysis: Secondary | ICD-10-CM | POA: Diagnosis not present

## 2015-12-16 DIAGNOSIS — N186 End stage renal disease: Secondary | ICD-10-CM | POA: Diagnosis not present

## 2015-12-16 DIAGNOSIS — Z992 Dependence on renal dialysis: Secondary | ICD-10-CM | POA: Diagnosis not present

## 2015-12-18 DIAGNOSIS — N2581 Secondary hyperparathyroidism of renal origin: Secondary | ICD-10-CM | POA: Diagnosis not present

## 2015-12-18 DIAGNOSIS — E162 Hypoglycemia, unspecified: Secondary | ICD-10-CM | POA: Diagnosis not present

## 2015-12-18 DIAGNOSIS — D509 Iron deficiency anemia, unspecified: Secondary | ICD-10-CM | POA: Diagnosis not present

## 2015-12-18 DIAGNOSIS — N186 End stage renal disease: Secondary | ICD-10-CM | POA: Diagnosis not present

## 2015-12-18 DIAGNOSIS — E11649 Type 2 diabetes mellitus with hypoglycemia without coma: Secondary | ICD-10-CM | POA: Diagnosis not present

## 2015-12-18 DIAGNOSIS — Z992 Dependence on renal dialysis: Secondary | ICD-10-CM | POA: Diagnosis not present

## 2015-12-18 DIAGNOSIS — D631 Anemia in chronic kidney disease: Secondary | ICD-10-CM | POA: Diagnosis not present

## 2015-12-20 DIAGNOSIS — N186 End stage renal disease: Secondary | ICD-10-CM | POA: Diagnosis not present

## 2015-12-20 DIAGNOSIS — N2581 Secondary hyperparathyroidism of renal origin: Secondary | ICD-10-CM | POA: Diagnosis not present

## 2015-12-20 DIAGNOSIS — D509 Iron deficiency anemia, unspecified: Secondary | ICD-10-CM | POA: Diagnosis not present

## 2015-12-20 DIAGNOSIS — E162 Hypoglycemia, unspecified: Secondary | ICD-10-CM | POA: Diagnosis not present

## 2015-12-20 DIAGNOSIS — Z992 Dependence on renal dialysis: Secondary | ICD-10-CM | POA: Diagnosis not present

## 2015-12-20 DIAGNOSIS — D631 Anemia in chronic kidney disease: Secondary | ICD-10-CM | POA: Diagnosis not present

## 2015-12-22 DIAGNOSIS — D509 Iron deficiency anemia, unspecified: Secondary | ICD-10-CM | POA: Diagnosis not present

## 2015-12-22 DIAGNOSIS — D631 Anemia in chronic kidney disease: Secondary | ICD-10-CM | POA: Diagnosis not present

## 2015-12-22 DIAGNOSIS — N186 End stage renal disease: Secondary | ICD-10-CM | POA: Diagnosis not present

## 2015-12-22 DIAGNOSIS — E162 Hypoglycemia, unspecified: Secondary | ICD-10-CM | POA: Diagnosis not present

## 2015-12-22 DIAGNOSIS — Z992 Dependence on renal dialysis: Secondary | ICD-10-CM | POA: Diagnosis not present

## 2015-12-22 DIAGNOSIS — N2581 Secondary hyperparathyroidism of renal origin: Secondary | ICD-10-CM | POA: Diagnosis not present

## 2015-12-25 DIAGNOSIS — D509 Iron deficiency anemia, unspecified: Secondary | ICD-10-CM | POA: Diagnosis not present

## 2015-12-25 DIAGNOSIS — N186 End stage renal disease: Secondary | ICD-10-CM | POA: Diagnosis not present

## 2015-12-25 DIAGNOSIS — N2581 Secondary hyperparathyroidism of renal origin: Secondary | ICD-10-CM | POA: Diagnosis not present

## 2015-12-25 DIAGNOSIS — Z992 Dependence on renal dialysis: Secondary | ICD-10-CM | POA: Diagnosis not present

## 2015-12-25 DIAGNOSIS — E162 Hypoglycemia, unspecified: Secondary | ICD-10-CM | POA: Diagnosis not present

## 2015-12-25 DIAGNOSIS — D631 Anemia in chronic kidney disease: Secondary | ICD-10-CM | POA: Diagnosis not present

## 2015-12-27 DIAGNOSIS — N2581 Secondary hyperparathyroidism of renal origin: Secondary | ICD-10-CM | POA: Diagnosis not present

## 2015-12-27 DIAGNOSIS — Z992 Dependence on renal dialysis: Secondary | ICD-10-CM | POA: Diagnosis not present

## 2015-12-27 DIAGNOSIS — D631 Anemia in chronic kidney disease: Secondary | ICD-10-CM | POA: Diagnosis not present

## 2015-12-27 DIAGNOSIS — D509 Iron deficiency anemia, unspecified: Secondary | ICD-10-CM | POA: Diagnosis not present

## 2015-12-27 DIAGNOSIS — N186 End stage renal disease: Secondary | ICD-10-CM | POA: Diagnosis not present

## 2015-12-27 DIAGNOSIS — E162 Hypoglycemia, unspecified: Secondary | ICD-10-CM | POA: Diagnosis not present

## 2015-12-29 DIAGNOSIS — N2581 Secondary hyperparathyroidism of renal origin: Secondary | ICD-10-CM | POA: Diagnosis not present

## 2015-12-29 DIAGNOSIS — Z992 Dependence on renal dialysis: Secondary | ICD-10-CM | POA: Diagnosis not present

## 2015-12-29 DIAGNOSIS — D631 Anemia in chronic kidney disease: Secondary | ICD-10-CM | POA: Diagnosis not present

## 2015-12-29 DIAGNOSIS — D509 Iron deficiency anemia, unspecified: Secondary | ICD-10-CM | POA: Diagnosis not present

## 2015-12-29 DIAGNOSIS — E162 Hypoglycemia, unspecified: Secondary | ICD-10-CM | POA: Diagnosis not present

## 2015-12-29 DIAGNOSIS — N186 End stage renal disease: Secondary | ICD-10-CM | POA: Diagnosis not present

## 2016-01-01 DIAGNOSIS — E162 Hypoglycemia, unspecified: Secondary | ICD-10-CM | POA: Diagnosis not present

## 2016-01-01 DIAGNOSIS — N186 End stage renal disease: Secondary | ICD-10-CM | POA: Diagnosis not present

## 2016-01-01 DIAGNOSIS — N2581 Secondary hyperparathyroidism of renal origin: Secondary | ICD-10-CM | POA: Diagnosis not present

## 2016-01-01 DIAGNOSIS — Z992 Dependence on renal dialysis: Secondary | ICD-10-CM | POA: Diagnosis not present

## 2016-01-01 DIAGNOSIS — D509 Iron deficiency anemia, unspecified: Secondary | ICD-10-CM | POA: Diagnosis not present

## 2016-01-01 DIAGNOSIS — D631 Anemia in chronic kidney disease: Secondary | ICD-10-CM | POA: Diagnosis not present

## 2016-01-03 DIAGNOSIS — D631 Anemia in chronic kidney disease: Secondary | ICD-10-CM | POA: Diagnosis not present

## 2016-01-03 DIAGNOSIS — Z992 Dependence on renal dialysis: Secondary | ICD-10-CM | POA: Diagnosis not present

## 2016-01-03 DIAGNOSIS — E162 Hypoglycemia, unspecified: Secondary | ICD-10-CM | POA: Diagnosis not present

## 2016-01-03 DIAGNOSIS — D509 Iron deficiency anemia, unspecified: Secondary | ICD-10-CM | POA: Diagnosis not present

## 2016-01-03 DIAGNOSIS — N2581 Secondary hyperparathyroidism of renal origin: Secondary | ICD-10-CM | POA: Diagnosis not present

## 2016-01-03 DIAGNOSIS — N186 End stage renal disease: Secondary | ICD-10-CM | POA: Diagnosis not present

## 2016-01-05 DIAGNOSIS — N2581 Secondary hyperparathyroidism of renal origin: Secondary | ICD-10-CM | POA: Diagnosis not present

## 2016-01-05 DIAGNOSIS — E162 Hypoglycemia, unspecified: Secondary | ICD-10-CM | POA: Diagnosis not present

## 2016-01-05 DIAGNOSIS — D631 Anemia in chronic kidney disease: Secondary | ICD-10-CM | POA: Diagnosis not present

## 2016-01-05 DIAGNOSIS — D509 Iron deficiency anemia, unspecified: Secondary | ICD-10-CM | POA: Diagnosis not present

## 2016-01-05 DIAGNOSIS — N186 End stage renal disease: Secondary | ICD-10-CM | POA: Diagnosis not present

## 2016-01-05 DIAGNOSIS — Z992 Dependence on renal dialysis: Secondary | ICD-10-CM | POA: Diagnosis not present

## 2016-01-08 DIAGNOSIS — N2581 Secondary hyperparathyroidism of renal origin: Secondary | ICD-10-CM | POA: Diagnosis not present

## 2016-01-08 DIAGNOSIS — D631 Anemia in chronic kidney disease: Secondary | ICD-10-CM | POA: Diagnosis not present

## 2016-01-08 DIAGNOSIS — E162 Hypoglycemia, unspecified: Secondary | ICD-10-CM | POA: Diagnosis not present

## 2016-01-08 DIAGNOSIS — Z992 Dependence on renal dialysis: Secondary | ICD-10-CM | POA: Diagnosis not present

## 2016-01-08 DIAGNOSIS — D509 Iron deficiency anemia, unspecified: Secondary | ICD-10-CM | POA: Diagnosis not present

## 2016-01-08 DIAGNOSIS — N186 End stage renal disease: Secondary | ICD-10-CM | POA: Diagnosis not present

## 2016-01-10 DIAGNOSIS — N2581 Secondary hyperparathyroidism of renal origin: Secondary | ICD-10-CM | POA: Diagnosis not present

## 2016-01-10 DIAGNOSIS — Z992 Dependence on renal dialysis: Secondary | ICD-10-CM | POA: Diagnosis not present

## 2016-01-10 DIAGNOSIS — N186 End stage renal disease: Secondary | ICD-10-CM | POA: Diagnosis not present

## 2016-01-10 DIAGNOSIS — D631 Anemia in chronic kidney disease: Secondary | ICD-10-CM | POA: Diagnosis not present

## 2016-01-10 DIAGNOSIS — D509 Iron deficiency anemia, unspecified: Secondary | ICD-10-CM | POA: Diagnosis not present

## 2016-01-10 DIAGNOSIS — E162 Hypoglycemia, unspecified: Secondary | ICD-10-CM | POA: Diagnosis not present

## 2016-01-12 DIAGNOSIS — N186 End stage renal disease: Secondary | ICD-10-CM | POA: Diagnosis not present

## 2016-01-12 DIAGNOSIS — D631 Anemia in chronic kidney disease: Secondary | ICD-10-CM | POA: Diagnosis not present

## 2016-01-12 DIAGNOSIS — D509 Iron deficiency anemia, unspecified: Secondary | ICD-10-CM | POA: Diagnosis not present

## 2016-01-12 DIAGNOSIS — E162 Hypoglycemia, unspecified: Secondary | ICD-10-CM | POA: Diagnosis not present

## 2016-01-12 DIAGNOSIS — N2581 Secondary hyperparathyroidism of renal origin: Secondary | ICD-10-CM | POA: Diagnosis not present

## 2016-01-12 DIAGNOSIS — Z992 Dependence on renal dialysis: Secondary | ICD-10-CM | POA: Diagnosis not present

## 2016-01-15 DIAGNOSIS — N186 End stage renal disease: Secondary | ICD-10-CM | POA: Diagnosis not present

## 2016-01-15 DIAGNOSIS — N2581 Secondary hyperparathyroidism of renal origin: Secondary | ICD-10-CM | POA: Diagnosis not present

## 2016-01-15 DIAGNOSIS — D509 Iron deficiency anemia, unspecified: Secondary | ICD-10-CM | POA: Diagnosis not present

## 2016-01-15 DIAGNOSIS — Z992 Dependence on renal dialysis: Secondary | ICD-10-CM | POA: Diagnosis not present

## 2016-01-15 DIAGNOSIS — E162 Hypoglycemia, unspecified: Secondary | ICD-10-CM | POA: Diagnosis not present

## 2016-01-15 DIAGNOSIS — D631 Anemia in chronic kidney disease: Secondary | ICD-10-CM | POA: Diagnosis not present

## 2016-01-16 DIAGNOSIS — Z992 Dependence on renal dialysis: Secondary | ICD-10-CM | POA: Diagnosis not present

## 2016-01-16 DIAGNOSIS — N186 End stage renal disease: Secondary | ICD-10-CM | POA: Diagnosis not present

## 2016-01-17 DIAGNOSIS — E162 Hypoglycemia, unspecified: Secondary | ICD-10-CM | POA: Diagnosis not present

## 2016-01-17 DIAGNOSIS — Z992 Dependence on renal dialysis: Secondary | ICD-10-CM | POA: Diagnosis not present

## 2016-01-17 DIAGNOSIS — N2581 Secondary hyperparathyroidism of renal origin: Secondary | ICD-10-CM | POA: Diagnosis not present

## 2016-01-17 DIAGNOSIS — D631 Anemia in chronic kidney disease: Secondary | ICD-10-CM | POA: Diagnosis not present

## 2016-01-17 DIAGNOSIS — N186 End stage renal disease: Secondary | ICD-10-CM | POA: Diagnosis not present

## 2016-01-17 DIAGNOSIS — D509 Iron deficiency anemia, unspecified: Secondary | ICD-10-CM | POA: Diagnosis not present

## 2016-01-17 DIAGNOSIS — E11649 Type 2 diabetes mellitus with hypoglycemia without coma: Secondary | ICD-10-CM | POA: Diagnosis not present

## 2016-01-19 DIAGNOSIS — N2581 Secondary hyperparathyroidism of renal origin: Secondary | ICD-10-CM | POA: Diagnosis not present

## 2016-01-19 DIAGNOSIS — E162 Hypoglycemia, unspecified: Secondary | ICD-10-CM | POA: Diagnosis not present

## 2016-01-19 DIAGNOSIS — N186 End stage renal disease: Secondary | ICD-10-CM | POA: Diagnosis not present

## 2016-01-19 DIAGNOSIS — D631 Anemia in chronic kidney disease: Secondary | ICD-10-CM | POA: Diagnosis not present

## 2016-01-19 DIAGNOSIS — D509 Iron deficiency anemia, unspecified: Secondary | ICD-10-CM | POA: Diagnosis not present

## 2016-01-19 DIAGNOSIS — Z992 Dependence on renal dialysis: Secondary | ICD-10-CM | POA: Diagnosis not present

## 2016-01-22 DIAGNOSIS — N2581 Secondary hyperparathyroidism of renal origin: Secondary | ICD-10-CM | POA: Diagnosis not present

## 2016-01-22 DIAGNOSIS — Z992 Dependence on renal dialysis: Secondary | ICD-10-CM | POA: Diagnosis not present

## 2016-01-22 DIAGNOSIS — E162 Hypoglycemia, unspecified: Secondary | ICD-10-CM | POA: Diagnosis not present

## 2016-01-22 DIAGNOSIS — N186 End stage renal disease: Secondary | ICD-10-CM | POA: Diagnosis not present

## 2016-01-22 DIAGNOSIS — D631 Anemia in chronic kidney disease: Secondary | ICD-10-CM | POA: Diagnosis not present

## 2016-01-22 DIAGNOSIS — D509 Iron deficiency anemia, unspecified: Secondary | ICD-10-CM | POA: Diagnosis not present

## 2016-01-24 DIAGNOSIS — Z992 Dependence on renal dialysis: Secondary | ICD-10-CM | POA: Diagnosis not present

## 2016-01-24 DIAGNOSIS — N2581 Secondary hyperparathyroidism of renal origin: Secondary | ICD-10-CM | POA: Diagnosis not present

## 2016-01-24 DIAGNOSIS — D509 Iron deficiency anemia, unspecified: Secondary | ICD-10-CM | POA: Diagnosis not present

## 2016-01-24 DIAGNOSIS — D631 Anemia in chronic kidney disease: Secondary | ICD-10-CM | POA: Diagnosis not present

## 2016-01-24 DIAGNOSIS — N186 End stage renal disease: Secondary | ICD-10-CM | POA: Diagnosis not present

## 2016-01-24 DIAGNOSIS — E162 Hypoglycemia, unspecified: Secondary | ICD-10-CM | POA: Diagnosis not present

## 2016-01-26 DIAGNOSIS — N186 End stage renal disease: Secondary | ICD-10-CM | POA: Diagnosis not present

## 2016-01-26 DIAGNOSIS — Z992 Dependence on renal dialysis: Secondary | ICD-10-CM | POA: Diagnosis not present

## 2016-01-26 DIAGNOSIS — D631 Anemia in chronic kidney disease: Secondary | ICD-10-CM | POA: Diagnosis not present

## 2016-01-26 DIAGNOSIS — D509 Iron deficiency anemia, unspecified: Secondary | ICD-10-CM | POA: Diagnosis not present

## 2016-01-26 DIAGNOSIS — N2581 Secondary hyperparathyroidism of renal origin: Secondary | ICD-10-CM | POA: Diagnosis not present

## 2016-01-26 DIAGNOSIS — E162 Hypoglycemia, unspecified: Secondary | ICD-10-CM | POA: Diagnosis not present

## 2016-01-29 DIAGNOSIS — N2581 Secondary hyperparathyroidism of renal origin: Secondary | ICD-10-CM | POA: Diagnosis not present

## 2016-01-29 DIAGNOSIS — N186 End stage renal disease: Secondary | ICD-10-CM | POA: Diagnosis not present

## 2016-01-29 DIAGNOSIS — D631 Anemia in chronic kidney disease: Secondary | ICD-10-CM | POA: Diagnosis not present

## 2016-01-29 DIAGNOSIS — Z992 Dependence on renal dialysis: Secondary | ICD-10-CM | POA: Diagnosis not present

## 2016-01-29 DIAGNOSIS — E162 Hypoglycemia, unspecified: Secondary | ICD-10-CM | POA: Diagnosis not present

## 2016-01-29 DIAGNOSIS — D509 Iron deficiency anemia, unspecified: Secondary | ICD-10-CM | POA: Diagnosis not present

## 2016-01-31 DIAGNOSIS — E162 Hypoglycemia, unspecified: Secondary | ICD-10-CM | POA: Diagnosis not present

## 2016-01-31 DIAGNOSIS — N2581 Secondary hyperparathyroidism of renal origin: Secondary | ICD-10-CM | POA: Diagnosis not present

## 2016-01-31 DIAGNOSIS — Z992 Dependence on renal dialysis: Secondary | ICD-10-CM | POA: Diagnosis not present

## 2016-01-31 DIAGNOSIS — D631 Anemia in chronic kidney disease: Secondary | ICD-10-CM | POA: Diagnosis not present

## 2016-01-31 DIAGNOSIS — D509 Iron deficiency anemia, unspecified: Secondary | ICD-10-CM | POA: Diagnosis not present

## 2016-01-31 DIAGNOSIS — N186 End stage renal disease: Secondary | ICD-10-CM | POA: Diagnosis not present

## 2016-02-02 DIAGNOSIS — Z992 Dependence on renal dialysis: Secondary | ICD-10-CM | POA: Diagnosis not present

## 2016-02-02 DIAGNOSIS — E162 Hypoglycemia, unspecified: Secondary | ICD-10-CM | POA: Diagnosis not present

## 2016-02-02 DIAGNOSIS — N2581 Secondary hyperparathyroidism of renal origin: Secondary | ICD-10-CM | POA: Diagnosis not present

## 2016-02-02 DIAGNOSIS — D631 Anemia in chronic kidney disease: Secondary | ICD-10-CM | POA: Diagnosis not present

## 2016-02-02 DIAGNOSIS — D509 Iron deficiency anemia, unspecified: Secondary | ICD-10-CM | POA: Diagnosis not present

## 2016-02-02 DIAGNOSIS — N186 End stage renal disease: Secondary | ICD-10-CM | POA: Diagnosis not present

## 2016-02-05 DIAGNOSIS — N2581 Secondary hyperparathyroidism of renal origin: Secondary | ICD-10-CM | POA: Diagnosis not present

## 2016-02-05 DIAGNOSIS — D509 Iron deficiency anemia, unspecified: Secondary | ICD-10-CM | POA: Diagnosis not present

## 2016-02-05 DIAGNOSIS — Z992 Dependence on renal dialysis: Secondary | ICD-10-CM | POA: Diagnosis not present

## 2016-02-05 DIAGNOSIS — N186 End stage renal disease: Secondary | ICD-10-CM | POA: Diagnosis not present

## 2016-02-05 DIAGNOSIS — D631 Anemia in chronic kidney disease: Secondary | ICD-10-CM | POA: Diagnosis not present

## 2016-02-05 DIAGNOSIS — E162 Hypoglycemia, unspecified: Secondary | ICD-10-CM | POA: Diagnosis not present

## 2016-02-07 DIAGNOSIS — E162 Hypoglycemia, unspecified: Secondary | ICD-10-CM | POA: Diagnosis not present

## 2016-02-07 DIAGNOSIS — D509 Iron deficiency anemia, unspecified: Secondary | ICD-10-CM | POA: Diagnosis not present

## 2016-02-07 DIAGNOSIS — Z992 Dependence on renal dialysis: Secondary | ICD-10-CM | POA: Diagnosis not present

## 2016-02-07 DIAGNOSIS — D631 Anemia in chronic kidney disease: Secondary | ICD-10-CM | POA: Diagnosis not present

## 2016-02-07 DIAGNOSIS — N2581 Secondary hyperparathyroidism of renal origin: Secondary | ICD-10-CM | POA: Diagnosis not present

## 2016-02-07 DIAGNOSIS — N186 End stage renal disease: Secondary | ICD-10-CM | POA: Diagnosis not present

## 2016-02-09 DIAGNOSIS — D509 Iron deficiency anemia, unspecified: Secondary | ICD-10-CM | POA: Diagnosis not present

## 2016-02-09 DIAGNOSIS — N186 End stage renal disease: Secondary | ICD-10-CM | POA: Diagnosis not present

## 2016-02-09 DIAGNOSIS — N2581 Secondary hyperparathyroidism of renal origin: Secondary | ICD-10-CM | POA: Diagnosis not present

## 2016-02-09 DIAGNOSIS — E162 Hypoglycemia, unspecified: Secondary | ICD-10-CM | POA: Diagnosis not present

## 2016-02-09 DIAGNOSIS — D631 Anemia in chronic kidney disease: Secondary | ICD-10-CM | POA: Diagnosis not present

## 2016-02-09 DIAGNOSIS — Z992 Dependence on renal dialysis: Secondary | ICD-10-CM | POA: Diagnosis not present

## 2016-02-11 ENCOUNTER — Encounter: Payer: Self-pay | Admitting: Vascular Surgery

## 2016-02-12 ENCOUNTER — Other Ambulatory Visit (HOSPITAL_COMMUNITY): Payer: Self-pay | Admitting: Internal Medicine

## 2016-02-12 DIAGNOSIS — D509 Iron deficiency anemia, unspecified: Secondary | ICD-10-CM | POA: Diagnosis not present

## 2016-02-12 DIAGNOSIS — E162 Hypoglycemia, unspecified: Secondary | ICD-10-CM | POA: Diagnosis not present

## 2016-02-12 DIAGNOSIS — N186 End stage renal disease: Secondary | ICD-10-CM | POA: Diagnosis not present

## 2016-02-12 DIAGNOSIS — N2581 Secondary hyperparathyroidism of renal origin: Secondary | ICD-10-CM | POA: Diagnosis not present

## 2016-02-12 DIAGNOSIS — M109 Gout, unspecified: Secondary | ICD-10-CM | POA: Diagnosis not present

## 2016-02-12 DIAGNOSIS — Z992 Dependence on renal dialysis: Secondary | ICD-10-CM | POA: Diagnosis not present

## 2016-02-12 DIAGNOSIS — E784 Other hyperlipidemia: Secondary | ICD-10-CM | POA: Diagnosis not present

## 2016-02-12 DIAGNOSIS — D631 Anemia in chronic kidney disease: Secondary | ICD-10-CM | POA: Diagnosis not present

## 2016-02-12 DIAGNOSIS — E118 Type 2 diabetes mellitus with unspecified complications: Secondary | ICD-10-CM | POA: Diagnosis not present

## 2016-02-12 DIAGNOSIS — Z780 Asymptomatic menopausal state: Secondary | ICD-10-CM

## 2016-02-14 DIAGNOSIS — D631 Anemia in chronic kidney disease: Secondary | ICD-10-CM | POA: Diagnosis not present

## 2016-02-14 DIAGNOSIS — N186 End stage renal disease: Secondary | ICD-10-CM | POA: Diagnosis not present

## 2016-02-14 DIAGNOSIS — D509 Iron deficiency anemia, unspecified: Secondary | ICD-10-CM | POA: Diagnosis not present

## 2016-02-14 DIAGNOSIS — N2581 Secondary hyperparathyroidism of renal origin: Secondary | ICD-10-CM | POA: Diagnosis not present

## 2016-02-14 DIAGNOSIS — Z992 Dependence on renal dialysis: Secondary | ICD-10-CM | POA: Diagnosis not present

## 2016-02-14 DIAGNOSIS — E162 Hypoglycemia, unspecified: Secondary | ICD-10-CM | POA: Diagnosis not present

## 2016-02-15 ENCOUNTER — Other Ambulatory Visit (HOSPITAL_COMMUNITY): Payer: Medicare Other

## 2016-02-15 DIAGNOSIS — Z992 Dependence on renal dialysis: Secondary | ICD-10-CM | POA: Diagnosis not present

## 2016-02-15 DIAGNOSIS — N186 End stage renal disease: Secondary | ICD-10-CM | POA: Diagnosis not present

## 2016-02-16 DIAGNOSIS — N2581 Secondary hyperparathyroidism of renal origin: Secondary | ICD-10-CM | POA: Diagnosis not present

## 2016-02-16 DIAGNOSIS — D509 Iron deficiency anemia, unspecified: Secondary | ICD-10-CM | POA: Diagnosis not present

## 2016-02-16 DIAGNOSIS — E162 Hypoglycemia, unspecified: Secondary | ICD-10-CM | POA: Diagnosis not present

## 2016-02-16 DIAGNOSIS — E11649 Type 2 diabetes mellitus with hypoglycemia without coma: Secondary | ICD-10-CM | POA: Diagnosis not present

## 2016-02-16 DIAGNOSIS — D631 Anemia in chronic kidney disease: Secondary | ICD-10-CM | POA: Diagnosis not present

## 2016-02-16 DIAGNOSIS — N186 End stage renal disease: Secondary | ICD-10-CM | POA: Diagnosis not present

## 2016-02-16 DIAGNOSIS — Z992 Dependence on renal dialysis: Secondary | ICD-10-CM | POA: Diagnosis not present

## 2016-02-19 DIAGNOSIS — Z992 Dependence on renal dialysis: Secondary | ICD-10-CM | POA: Diagnosis not present

## 2016-02-19 DIAGNOSIS — N186 End stage renal disease: Secondary | ICD-10-CM | POA: Diagnosis not present

## 2016-02-19 DIAGNOSIS — N2581 Secondary hyperparathyroidism of renal origin: Secondary | ICD-10-CM | POA: Diagnosis not present

## 2016-02-19 DIAGNOSIS — D631 Anemia in chronic kidney disease: Secondary | ICD-10-CM | POA: Diagnosis not present

## 2016-02-19 DIAGNOSIS — D509 Iron deficiency anemia, unspecified: Secondary | ICD-10-CM | POA: Diagnosis not present

## 2016-02-19 DIAGNOSIS — E162 Hypoglycemia, unspecified: Secondary | ICD-10-CM | POA: Diagnosis not present

## 2016-02-21 ENCOUNTER — Other Ambulatory Visit: Payer: Self-pay

## 2016-02-21 ENCOUNTER — Ambulatory Visit (INDEPENDENT_AMBULATORY_CARE_PROVIDER_SITE_OTHER): Payer: Medicare Other | Admitting: Vascular Surgery

## 2016-02-21 ENCOUNTER — Encounter: Payer: Self-pay | Admitting: Vascular Surgery

## 2016-02-21 VITALS — BP 124/40 | HR 80 | Temp 97.2°F | Ht 65.0 in | Wt 122.7 lb

## 2016-02-21 DIAGNOSIS — E162 Hypoglycemia, unspecified: Secondary | ICD-10-CM | POA: Diagnosis not present

## 2016-02-21 DIAGNOSIS — D631 Anemia in chronic kidney disease: Secondary | ICD-10-CM | POA: Diagnosis not present

## 2016-02-21 DIAGNOSIS — Z992 Dependence on renal dialysis: Secondary | ICD-10-CM | POA: Diagnosis not present

## 2016-02-21 DIAGNOSIS — D509 Iron deficiency anemia, unspecified: Secondary | ICD-10-CM | POA: Diagnosis not present

## 2016-02-21 DIAGNOSIS — N186 End stage renal disease: Secondary | ICD-10-CM

## 2016-02-21 DIAGNOSIS — N2581 Secondary hyperparathyroidism of renal origin: Secondary | ICD-10-CM | POA: Diagnosis not present

## 2016-02-21 NOTE — Progress Notes (Signed)
HISTORY AND PHYSICAL     CC:  HD machine alarms during dialysis Referring Provider:  Rosita Fire, MD  HPI: This is a 82 y.o. female who underwent a left brachiocephalic AVF placement by Dr. Bridgett Larsson in January 2015.  She saw Dr. Bridgett Larsson in August 2016 after it was reported that the fistula was not able to be cannulated.  At that time, the fistula was <42mm deep and >63mm throughout its length.  He did not feel at that time she would benefit from branch ligation as the fistula was adequate size.  She did have a fistulogram at [facility] that was normal in August 2016 by Dr. Posey Pronto.    She presents today with c/o the machine alarms during her dialysis treatment.  Per Dr. Lowanda Foster, there is low transonics.  She is here for evaluation.  She is on a daily aspirin.  She is on a beta blocker for blood pressure management.  She is on Tradjenta for diabetic management.    Past Medical History  Diagnosis Date  . Colon cancer ([place])     cecum cancer  . Iron deficiency anemia   . Diabetes mellitus   . Leukopenia   . Gout   . Hypercholesteremia   . Stroke ([place]) 2005  . Obesity   . 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 Iron deficiency 05/20/2012  . Chronic kidney disease   . Hypertension   . Shortness of breath     wheezest time    Past Surgical History  Procedure Laterality Date  . Cataract extraction, bilateral      lens implants  . Abdominal hysterectomy    . Colon surgery  2010  . Eye surgery    . Av fistula placement Left 08/24/2013    Procedure: ARTERIOVENOUS (AV) FISTULA CREATION- LEFT BRACHIAL CEPHALIC;  Surgeon: Conrad [place], MD;  Location: [facility];  Service: Vascular;  Laterality: Left;    No Known Allergies  Current Outpatient Prescriptions  Medication Sig Dispense Refill  . aspirin EC 81 MG tablet Take 81 mg by mouth daily.    Marland Kitchen RENVELA 800 MG tablet     . amLODipine (NORVASC) 10 MG tablet Take 10 mg by mouth daily. Reported on 02/21/2016    . atenolol (TENORMIN) 100 MG tablet Take  100 mg by mouth daily. Reported on 02/21/2016    . calcitRIOL (ROCALTROL) 0.25 MCG capsule Take 0.25 mcg by mouth daily. Reported on 02/21/2016    . Cholecalciferol (D3 HIGH POTENCY) 1000 UNITS capsule Take 1,000 Units by mouth daily. Reported on 02/21/2016    . furosemide (LASIX) 40 MG tablet Reported on 02/21/2016    . hydrALAZINE (APRESOLINE) 50 MG tablet Take 50 mg by mouth 2 (two) times daily. Reported on 02/21/2016    . lidocaine-prilocaine (EMLA) cream Reported on 02/21/2016    . linagliptin (TRADJENTA) 5 MG TABS tablet Take 5 mg by mouth daily. Reported on 02/21/2016    . predniSONE (DELTASONE) 10 MG tablet Reported on 02/21/2016    . ranitidine (ZANTAC) 150 MG tablet Take 1 tablet (150 mg total) by mouth 2 (two) times daily. (Patient not taking: Reported on 02/21/2016) 60 tablet 0  . sucralfate (CARAFATE) 1 GM/10ML suspension Take 10 mLs (1 g total) by mouth 4 (four) times daily -  with meals and at bedtime. (Patient not taking: Reported on 02/21/2016) 420 mL 0  . traMADol (ULTRAM) 50 MG tablet Take 1 tablet (50 mg total) by mouth every 6 (six) hours as needed. (Patient  not taking: Reported on 02/21/2016) 15 tablet 0   No current facility-administered medications for this visit.    Family History  Problem Relation Age of Onset  . Diabetes Mother   . Hypertension Mother   . Hyperlipidemia Daughter   . Varicose Veins Daughter   . Cancer Son   . Diabetes Son   . Heart disease Son   . Hyperlipidemia Son     Social History   Social History  . Marital Status: Widowed    Spouse Name: N/A  . Number of Children: N/A  . Years of Education: N/A   Occupational History  . Not on file.   Social History Main Topics  . Smoking status: Former Smoker -- 1.00 packs/day for 10 years    Types: Cigarettes    Quit date: 04/12/1985  . Smokeless tobacco: Never Used  . Alcohol Use: No  . Drug Use: No  . Sexual Activity: No   Other Topics Concern  . Not on file   Social History Narrative     REVIEW OF  SYSTEMS:   [X]  denotes positive finding, [ ]  denotes negative finding Cardiac  Comments:  Chest pain or chest pressure:    Shortness of breath upon exertion:    Short of breath when lying flat:    Irregular heart rhythm:        Vascular    Pain in calf, thigh, or hip brought on by ambulation:    Pain in feet at night that wakes you up from your sleep:     Blood clot in your veins:    Leg swelling:     Leg cramps x   Pulmonary    Oxygen at home:    Productive cough:     Wheezing:         Neurologic    Sudden weakness in arms or legs:     Sudden numbness in arms or legs:     Sudden onset of difficulty speaking or slurred speech:    Temporary loss of vision in one eye:     Problems with dizziness:         Gastrointestinal    Blood in stool:     Vomited blood:         Genitourinary    Burning when urinating:     Blood in urine:        Psychiatric    Major depression:         Hematologic    Bleeding problems:    Problems with blood clotting too easily:        Skin    Rashes or ulcers:        Constitutional    Fever or chills:      PHYSICAL EXAMINATION:  Filed Vitals:   02/21/16 1329  BP: 124/40  Pulse: 80  Temp: 97.2 F (36.2 C)   Body mass index is 20.42 kg/(m^2).  General:  WDWN in NAD; vital signs documented above Gait: Normal HENT: WNL, normocephalic Pulmonary: normal non-labored breathing , without Rales, rhonchi,  wheezing Cardiac: regular HR, without  Murmurs, rubs or gallops; without carotid bruits Abdomen: soft, NT, no masses Skin: without rashes Vascular Exam/Pulses:  Right Left  Radial 1+ (weak) Unable to palpate   Ulnar Unable to palpate Unable to palpate    Extremities: without ischemic changes, without Gangrene , without cellulitis; without open wounds; +strong thrill/bruit in the distal portion of the fistula, but decreases and becomes more pulsatile more proximal Musculoskeletal:  no muscle wasting or atrophy  Neurologic: A&O X 3;   No focal weakness or paresthesias are detected Psychiatric:  The pt has Normal affect.   Non-Invasive Vascular Imaging:   None today  Pt meds includes: Statin:  No. Beta Blocker:  Yes.   Aspirin:  Yes.   ACEI:  No. ARB:  No. Other Antiplatelet/Anticoagulant:  No.    ASSESSMENT/PLAN:: 82 y.o. female with ESRD with low flow rates on dialysis   -pt has a strong bruit/thrill at the distal portion of the fistula, but this decreases and becomes more pulsatile proximally, which may represent a narrowing. -will plan for fistulogram on March 03, 2016 with possible intervention by Dr. Scot Dock -she dialyzes T/T/S.     Leontine Locket, PA-C [facility] [phone]  Clinic MD:  Pt seen and examined in conjunction with Dr. Oneida Alar  History and exam findings as above. We will plan for fistulogram 03/03/2016.  Ruta Hinds, MD [facility] [place] Office: [phone] Pager: [phone]

## 2016-02-23 DIAGNOSIS — D631 Anemia in chronic kidney disease: Secondary | ICD-10-CM | POA: Diagnosis not present

## 2016-02-23 DIAGNOSIS — Z992 Dependence on renal dialysis: Secondary | ICD-10-CM | POA: Diagnosis not present

## 2016-02-23 DIAGNOSIS — N2581 Secondary hyperparathyroidism of renal origin: Secondary | ICD-10-CM | POA: Diagnosis not present

## 2016-02-23 DIAGNOSIS — E162 Hypoglycemia, unspecified: Secondary | ICD-10-CM | POA: Diagnosis not present

## 2016-02-23 DIAGNOSIS — N186 End stage renal disease: Secondary | ICD-10-CM | POA: Diagnosis not present

## 2016-02-23 DIAGNOSIS — D509 Iron deficiency anemia, unspecified: Secondary | ICD-10-CM | POA: Diagnosis not present

## 2016-02-26 DIAGNOSIS — E119 Type 2 diabetes mellitus without complications: Secondary | ICD-10-CM | POA: Diagnosis not present

## 2016-02-26 DIAGNOSIS — N2581 Secondary hyperparathyroidism of renal origin: Secondary | ICD-10-CM | POA: Diagnosis not present

## 2016-02-26 DIAGNOSIS — Z992 Dependence on renal dialysis: Secondary | ICD-10-CM | POA: Diagnosis not present

## 2016-02-26 DIAGNOSIS — D509 Iron deficiency anemia, unspecified: Secondary | ICD-10-CM | POA: Diagnosis not present

## 2016-02-26 DIAGNOSIS — D631 Anemia in chronic kidney disease: Secondary | ICD-10-CM | POA: Diagnosis not present

## 2016-02-26 DIAGNOSIS — E162 Hypoglycemia, unspecified: Secondary | ICD-10-CM | POA: Diagnosis not present

## 2016-02-26 DIAGNOSIS — N186 End stage renal disease: Secondary | ICD-10-CM | POA: Diagnosis not present

## 2016-02-28 DIAGNOSIS — N186 End stage renal disease: Secondary | ICD-10-CM | POA: Diagnosis not present

## 2016-02-28 DIAGNOSIS — N2581 Secondary hyperparathyroidism of renal origin: Secondary | ICD-10-CM | POA: Diagnosis not present

## 2016-02-28 DIAGNOSIS — D631 Anemia in chronic kidney disease: Secondary | ICD-10-CM | POA: Diagnosis not present

## 2016-02-28 DIAGNOSIS — E162 Hypoglycemia, unspecified: Secondary | ICD-10-CM | POA: Diagnosis not present

## 2016-02-28 DIAGNOSIS — D509 Iron deficiency anemia, unspecified: Secondary | ICD-10-CM | POA: Diagnosis not present

## 2016-02-28 DIAGNOSIS — Z992 Dependence on renal dialysis: Secondary | ICD-10-CM | POA: Diagnosis not present

## 2016-03-01 DIAGNOSIS — N2581 Secondary hyperparathyroidism of renal origin: Secondary | ICD-10-CM | POA: Diagnosis not present

## 2016-03-01 DIAGNOSIS — E162 Hypoglycemia, unspecified: Secondary | ICD-10-CM | POA: Diagnosis not present

## 2016-03-01 DIAGNOSIS — D631 Anemia in chronic kidney disease: Secondary | ICD-10-CM | POA: Diagnosis not present

## 2016-03-01 DIAGNOSIS — Z992 Dependence on renal dialysis: Secondary | ICD-10-CM | POA: Diagnosis not present

## 2016-03-01 DIAGNOSIS — D509 Iron deficiency anemia, unspecified: Secondary | ICD-10-CM | POA: Diagnosis not present

## 2016-03-01 DIAGNOSIS — N186 End stage renal disease: Secondary | ICD-10-CM | POA: Diagnosis not present

## 2016-03-03 ENCOUNTER — Encounter (HOSPITAL_COMMUNITY)
Admission: RE | Disposition: A | Discharge status: Home / Self Care | Payer: Self-pay | Source: Ambulatory Visit | Attending: Vascular Surgery

## 2016-03-03 ENCOUNTER — Encounter (HOSPITAL_COMMUNITY): Payer: Self-pay | Admitting: Vascular Surgery

## 2016-03-03 ENCOUNTER — Ambulatory Visit (HOSPITAL_COMMUNITY)
Admission: RE | Admit: 2016-03-03 | Discharge: 2016-03-03 | Disposition: A | Discharge status: Home / Self Care | Payer: Medicare Other | Source: Ambulatory Visit | Attending: Vascular Surgery | Admitting: Vascular Surgery

## 2016-03-03 DIAGNOSIS — Z87891 Personal history of nicotine dependence: Secondary | ICD-10-CM | POA: Diagnosis not present

## 2016-03-03 DIAGNOSIS — Z8249 Family history of ischemic heart disease and other diseases of the circulatory system: Secondary | ICD-10-CM | POA: Insufficient documentation

## 2016-03-03 DIAGNOSIS — D509 Iron deficiency anemia, unspecified: Secondary | ICD-10-CM | POA: Diagnosis not present

## 2016-03-03 DIAGNOSIS — T82858A Stenosis of vascular prosthetic devices, implants and grafts, initial encounter: Secondary | ICD-10-CM | POA: Diagnosis not present

## 2016-03-03 DIAGNOSIS — Z8673 Personal history of transient ischemic attack (TIA), and cerebral infarction without residual deficits: Secondary | ICD-10-CM | POA: Insufficient documentation

## 2016-03-03 DIAGNOSIS — Z6823 Body mass index (BMI) 23.0-23.9, adult: Secondary | ICD-10-CM | POA: Insufficient documentation

## 2016-03-03 DIAGNOSIS — Z7982 Long term (current) use of aspirin: Secondary | ICD-10-CM | POA: Insufficient documentation

## 2016-03-03 DIAGNOSIS — Z85038 Personal history of other malignant neoplasm of large intestine: Secondary | ICD-10-CM | POA: Diagnosis not present

## 2016-03-03 DIAGNOSIS — M109 Gout, unspecified: Secondary | ICD-10-CM | POA: Insufficient documentation

## 2016-03-03 DIAGNOSIS — Z7952 Long term (current) use of systemic steroids: Secondary | ICD-10-CM | POA: Insufficient documentation

## 2016-03-03 DIAGNOSIS — E7800 Pure hypercholesterolemia, unspecified: Secondary | ICD-10-CM | POA: Diagnosis not present

## 2016-03-03 DIAGNOSIS — T82898A Other specified complication of vascular prosthetic devices, implants and grafts, initial encounter: Secondary | ICD-10-CM | POA: Diagnosis not present

## 2016-03-03 DIAGNOSIS — I120 Hypertensive chronic kidney disease with stage 5 chronic kidney disease or end stage renal disease: Secondary | ICD-10-CM | POA: Diagnosis not present

## 2016-03-03 DIAGNOSIS — N186 End stage renal disease: Secondary | ICD-10-CM | POA: Diagnosis not present

## 2016-03-03 DIAGNOSIS — E1122 Type 2 diabetes mellitus with diabetic chronic kidney disease: Secondary | ICD-10-CM | POA: Insufficient documentation

## 2016-03-03 DIAGNOSIS — E669 Obesity, unspecified: Secondary | ICD-10-CM | POA: Insufficient documentation

## 2016-03-03 DIAGNOSIS — Y832 Surgical operation with anastomosis, bypass or graft as the cause of abnormal reaction of the patient, or of later complication, without mention of misadventure at the time of the procedure: Secondary | ICD-10-CM | POA: Diagnosis not present

## 2016-03-03 DIAGNOSIS — Z992 Dependence on renal dialysis: Secondary | ICD-10-CM | POA: Diagnosis not present

## 2016-03-03 HISTORY — PX: PERIPHERAL VASCULAR CATHETERIZATION: SHX172C

## 2016-03-03 LAB — POCT I-STAT, CHEM 8
BUN: 53 mg/dL — ABNORMAL HIGH (ref 6–20)
CREATININE: 7.8 mg/dL — AB (ref 0.44–1.00)
Calcium, Ion: 1.18 mmol/L (ref 1.12–1.23)
Chloride: 105 mmol/L (ref 101–111)
Glucose, Bld: 116 mg/dL — ABNORMAL HIGH (ref 65–99)
HEMATOCRIT: 37 % (ref 36.0–46.0)
Hemoglobin: 12.6 g/dL (ref 12.0–15.0)
POTASSIUM: 4.7 mmol/L (ref 3.5–5.1)
Sodium: 139 mmol/L (ref 135–145)
TCO2: 25 mmol/L (ref 0–100)

## 2016-03-03 SURGERY — A/V SHUNTOGRAM/FISTULAGRAM
Anesthesia: LOCAL

## 2016-03-03 MED ORDER — HEPARIN (PORCINE) IN NACL 2-0.9 UNIT/ML-% IJ SOLN
INTRAMUSCULAR | Status: DC | PRN
  Administered 2016-03-03: 500 mL

## 2016-03-03 MED ORDER — LIDOCAINE HCL (PF) 1 % IJ SOLN
INTRAMUSCULAR | Status: AC
  Filled 2016-03-03: qty 30

## 2016-03-03 MED ORDER — HEPARIN SODIUM (PORCINE) 1000 UNIT/ML IJ SOLN
INTRAMUSCULAR | Status: DC | PRN
Start: 2016-03-03 — End: 2016-03-03
  Administered 2016-03-03: 2000 [IU] via INTRAVENOUS

## 2016-03-03 MED ORDER — SODIUM CHLORIDE 0.9% FLUSH: 3.0000 mL | INTRAVENOUS | Status: DC | PRN

## 2016-03-03 MED ORDER — LIDOCAINE HCL (PF) 1 % IJ SOLN
INTRAMUSCULAR | Status: DC | PRN
  Administered 2016-03-03: 2 mL via SUBCUTANEOUS

## 2016-03-03 MED ORDER — HEPARIN SODIUM (PORCINE) 1000 UNIT/ML IJ SOLN
INTRAMUSCULAR | Status: AC
  Filled 2016-03-03: qty 1

## 2016-03-03 MED ORDER — IODIXANOL 320 MG/ML IV SOLN
INTRAVENOUS | Status: DC | PRN
  Administered 2016-03-03: 3372.8 mL

## 2016-03-03 SURGICAL SUPPLY — 16 items
BALLN MUSTANG 6.0X20 75 (BALLOONS) ×3
BALLOON MUSTANG 6.0X20 75 (BALLOONS) ×2 IMPLANT
COVER DOME SNAP 22 D (MISCELLANEOUS) ×3 IMPLANT
COVER PRB 48X5XTLSCP FOLD TPE (BAG) ×2 IMPLANT
COVER PROBE 5X48 (BAG) ×1
DEVICE TORQUE .025-.038 (MISCELLANEOUS) ×3 IMPLANT
GUIDEWIRE ANGLED .035X150CM (WIRE) ×3 IMPLANT
KIT ENCORE 26 ADVANTAGE (KITS) ×3 IMPLANT
KIT MICROINTRODUCER STIFF 5F (SHEATH) ×3 IMPLANT
PROTECTION STATION PRESSURIZED (MISCELLANEOUS) ×3
SHEATH PINNACLE R/O II 6F 4CM (SHEATH) ×3 IMPLANT
STATION PROTECTION PRESSURIZED (MISCELLANEOUS) ×2 IMPLANT
STOPCOCK MORSE 400PSI 3WAY (MISCELLANEOUS) ×3 IMPLANT
TRAY PV CATH (CUSTOM PROCEDURE TRAY) ×3 IMPLANT
TUBING CIL FLEX 10 FLL-RA (TUBING) ×3 IMPLANT
WIRE BENTSON .035X145CM (WIRE) ×6 IMPLANT

## 2016-03-03 NOTE — Op Note (Signed)
   PATIENT: Destiny Boyd  MRN: [id number] DOB: [birth year]    DATE OF PROCEDURE: 03/03/2016  INDICATIONS: Lenola PURVI RUEHL is a 82 y.o. female with a poorly functioning left brachiocephalic AV fistula. She presents for a fistulogram.  PROCEDURE:  1. Ultrasound-guided access to left brachiocephalic AV fistula 2. Fistulogram left brachiocephalic AV fistula 3. Venoplasty left brachiocephalic AV fistula  SURGEON: Judeth Cornfield. Scot Dock, MD, FACS  ANESTHESIA: local   EBL: minimal  TECHNIQUE: The patient was taken to the peripheral vascular lab. The left arm was prepped and draped in the usual sterile fashion. Under ultrasound guidance, after the skin was anesthetized, the proximal fistula was cannulated with a micropuncture needle and a micro-puncture sheath introduced over a wire. The fistula was examined from the site of cannulation to the central veins. There was a weblike stenosis in the upper third of the arm which I elected to address with venoplasty.  There was also some slight irregularity in the most central portion of the cephalic vein before entering the subclavian vein. There were also multiple collaterals present.  The micropuncture sheath was exchanged for a short 6 [place] sheath over a [employer] wire. The patient and received 2000 units of IV heparin. A 6 mm x 2 cm Powerflex balloon was selected and positioned across the weblike stenosis and inflated to rated pressure for 2 minutes. Completion film showed a good result. There appeared to be some irregularity in the central portion of the cephalic vein and therefore I went back with the 6 mm x 2 cm balloon and ballooned the central portion of the cephalic vein The balloon was inflated to  18 atm for 2 minutes.  Ablation film did not show significant residual stenosis.Of note I did attempt tissue debridement reflux shot to evaluate the arterial anastomosis with the balloon inflated, however, there were multiple collaterals and therefore  there was no reflux.  FINDINGS:  1. Weblike stenosis in the upper part of the fistula which was successfully addressed with venoplasty as described above 2. Moderate narrowing in the most central portion of the fistula which was also addressed with venoplasty. 3. Multiple collaterals in the proximal fistula just above the antecubital level.  CLINICAL NOTE: If the patient continues to have problems with the fistula consideration could be given to ligating the competing branches in the operating room.  Deitra Mayo, MD, FACS [facility]  DATE OF DICTATION:   03/03/2016

## 2016-03-03 NOTE — Interval H&P Note (Signed)
[facility] [place]  History and Physical Update  The patient was interviewed and re-examined.  The patient's previous History and Physical has been reviewed and is unchanged from Dr. Oneida Alar' consult.  There is no change in the plan of care: L arm fistulogram, possible intervention.  This case will be completed by Dr. Scot Dock.   Risk, benefits, and alternatives to access surgery were discussed.    The patient is aware the risks include but are not limited to: bleeding, infection, steal syndrome, nerve damage, ischemic monomelic neuropathy, failure to mature, need for additional procedures, death and stroke.    The patient agrees to proceed forward with the procedure.  Adele Barthel, MD [facility] [place] Office: [phone] Pager: [phone]  03/03/2016, 8:48 AM

## 2016-03-03 NOTE — H&P (View-Only) (Signed)
HISTORY AND PHYSICAL     CC:  HD machine alarms during dialysis Referring Provider:  Rosita Fire, MD  HPI: This is a 82 y.o. female who underwent a left brachiocephalic AVF placement by Dr. Bridgett Larsson in January 2015.  She saw Dr. Bridgett Larsson in August 2016 after it was reported that the fistula was not able to be cannulated.  At that time, the fistula was <6mm deep and >65mm throughout its length.  He did not feel at that time she would benefit from branch ligation as the fistula was adequate size.  She did have a fistulogram at [facility] that was normal in August 2016 by Dr. Posey Pronto.    She presents today with c/o the machine alarms during her dialysis treatment.  Per Dr. Lowanda Foster, there is low transonics.  She is here for evaluation.  She is on a daily aspirin.  She is on a beta blocker for blood pressure management.  She is on Tradjenta for diabetic management.    Past Medical History  Diagnosis Date  . Colon cancer ([place])     cecum cancer  . Iron deficiency anemia   . Diabetes mellitus   . Leukopenia   . Gout   . Hypercholesteremia   . Stroke ([place]) 2005  . Obesity   . 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 Iron deficiency 05/20/2012  . Chronic kidney disease   . Hypertension   . Shortness of breath     wheezest time    Past Surgical History  Procedure Laterality Date  . Cataract extraction, bilateral      lens implants  . Abdominal hysterectomy    . Colon surgery  2010  . Eye surgery    . Av fistula placement Left 08/24/2013    Procedure: ARTERIOVENOUS (AV) FISTULA CREATION- LEFT BRACHIAL CEPHALIC;  Surgeon: Conrad [place], MD;  Location: [facility];  Service: Vascular;  Laterality: Left;    No Known Allergies  Current Outpatient Prescriptions  Medication Sig Dispense Refill  . aspirin EC 81 MG tablet Take 81 mg by mouth daily.    Marland Kitchen RENVELA 800 MG tablet     . amLODipine (NORVASC) 10 MG tablet Take 10 mg by mouth daily. Reported on 02/21/2016    . atenolol (TENORMIN) 100 MG tablet Take  100 mg by mouth daily. Reported on 02/21/2016    . calcitRIOL (ROCALTROL) 0.25 MCG capsule Take 0.25 mcg by mouth daily. Reported on 02/21/2016    . Cholecalciferol (D3 HIGH POTENCY) 1000 UNITS capsule Take 1,000 Units by mouth daily. Reported on 02/21/2016    . furosemide (LASIX) 40 MG tablet Reported on 02/21/2016    . hydrALAZINE (APRESOLINE) 50 MG tablet Take 50 mg by mouth 2 (two) times daily. Reported on 02/21/2016    . lidocaine-prilocaine (EMLA) cream Reported on 02/21/2016    . linagliptin (TRADJENTA) 5 MG TABS tablet Take 5 mg by mouth daily. Reported on 02/21/2016    . predniSONE (DELTASONE) 10 MG tablet Reported on 02/21/2016    . ranitidine (ZANTAC) 150 MG tablet Take 1 tablet (150 mg total) by mouth 2 (two) times daily. (Patient not taking: Reported on 02/21/2016) 60 tablet 0  . sucralfate (CARAFATE) 1 GM/10ML suspension Take 10 mLs (1 g total) by mouth 4 (four) times daily -  with meals and at bedtime. (Patient not taking: Reported on 02/21/2016) 420 mL 0  . traMADol (ULTRAM) 50 MG tablet Take 1 tablet (50 mg total) by mouth every 6 (six) hours as needed. (Patient  not taking: Reported on 02/21/2016) 15 tablet 0   No current facility-administered medications for this visit.    Family History  Problem Relation Age of Onset  . Diabetes Mother   . Hypertension Mother   . Hyperlipidemia Daughter   . Varicose Veins Daughter   . Cancer Son   . Diabetes Son   . Heart disease Son   . Hyperlipidemia Son     Social History   Social History  . Marital Status: Widowed    Spouse Name: N/A  . Number of Children: N/A  . Years of Education: N/A   Occupational History  . Not on file.   Social History Main Topics  . Smoking status: Former Smoker -- 1.00 packs/day for 10 years    Types: Cigarettes    Quit date: 04/12/1985  . Smokeless tobacco: Never Used  . Alcohol Use: No  . Drug Use: No  . Sexual Activity: No   Other Topics Concern  . Not on file   Social History Narrative     REVIEW OF  SYSTEMS:   [X]  denotes positive finding, [ ]  denotes negative finding Cardiac  Comments:  Chest pain or chest pressure:    Shortness of breath upon exertion:    Short of breath when lying flat:    Irregular heart rhythm:        Vascular    Pain in calf, thigh, or hip brought on by ambulation:    Pain in feet at night that wakes you up from your sleep:     Blood clot in your veins:    Leg swelling:     Leg cramps x   Pulmonary    Oxygen at home:    Productive cough:     Wheezing:         Neurologic    Sudden weakness in arms or legs:     Sudden numbness in arms or legs:     Sudden onset of difficulty speaking or slurred speech:    Temporary loss of vision in one eye:     Problems with dizziness:         Gastrointestinal    Blood in stool:     Vomited blood:         Genitourinary    Burning when urinating:     Blood in urine:        Psychiatric    Major depression:         Hematologic    Bleeding problems:    Problems with blood clotting too easily:        Skin    Rashes or ulcers:        Constitutional    Fever or chills:      PHYSICAL EXAMINATION:  Filed Vitals:   02/21/16 1329  BP: 124/40  Pulse: 80  Temp: 97.2 F (36.2 C)   Body mass index is 20.42 kg/(m^2).  General:  WDWN in NAD; vital signs documented above Gait: Normal HENT: WNL, normocephalic Pulmonary: normal non-labored breathing , without Rales, rhonchi,  wheezing Cardiac: regular HR, without  Murmurs, rubs or gallops; without carotid bruits Abdomen: soft, NT, no masses Skin: without rashes Vascular Exam/Pulses:  Right Left  Radial 1+ (weak) Unable to palpate   Ulnar Unable to palpate Unable to palpate    Extremities: without ischemic changes, without Gangrene , without cellulitis; without open wounds; +strong thrill/bruit in the distal portion of the fistula, but decreases and becomes more pulsatile more proximal Musculoskeletal:  no muscle wasting or atrophy  Neurologic: A&O X 3;   No focal weakness or paresthesias are detected Psychiatric:  The pt has Normal affect.   Non-Invasive Vascular Imaging:   None today  Pt meds includes: Statin:  No. Beta Blocker:  Yes.   Aspirin:  Yes.   ACEI:  No. ARB:  No. Other Antiplatelet/Anticoagulant:  No.    ASSESSMENT/PLAN:: 82 y.o. female with ESRD with low flow rates on dialysis   -pt has a strong bruit/thrill at the distal portion of the fistula, but this decreases and becomes more pulsatile proximally, which may represent a narrowing. -will plan for fistulogram on March 03, 2016 with possible intervention by Dr. Scot Dock -she dialyzes T/T/S.     Leontine Locket, PA-C [facility] [phone]  Clinic MD:  Pt seen and examined in conjunction with Dr. Oneida Alar  History and exam findings as above. We will plan for fistulogram 03/03/2016.  Ruta Hinds, MD [facility] [place] Office: [phone] Pager: [phone]

## 2016-03-03 NOTE — Discharge Instructions (Signed)
Fistulogram, Care After °Refer to this sheet in the next few weeks. These instructions provide you with information on caring for yourself after your procedure. Your health care provider may also give you more specific instructions. Your treatment has been planned according to current medical practices, but problems sometimes occur. Call your health care provider if you have any problems or questions after your procedure. °WHAT TO EXPECT AFTER THE PROCEDURE °After your procedure, it is typical to have the following: °· A small amount of discomfort in the area where the catheters were placed. °· A small amount of bruising around the fistula. °· Sleepiness and fatigue. °HOME CARE INSTRUCTIONS °· Rest at home for the day following your procedure. °· Do not drive or operate heavy machinery while taking pain medicine. °· Take medicines only as directed by your health care provider. °· Do not take baths, swim, or use a hot tub until your health care provider approves. You may shower 24 hours after the procedure or as directed by your health care provider. °· There are many different ways to close and cover an incision, including stitches, skin glue, and adhesive strips. Follow your health care provider's instructions on: °¨ Incision care. °¨ Bandage (dressing) changes and removal. °¨ Incision closure removal. °· Monitor your dialysis fistula carefully. °SEEK MEDICAL CARE IF: °· You have drainage, redness, swelling, or pain at your catheter site. °· You have a fever. °· You have chills. °SEEK IMMEDIATE MEDICAL CARE IF: °· You feel weak. °· You have trouble balancing. °· You have trouble moving your arms or legs. °· You have problems with your speech or vision. °· You can no longer feel a vibration or buzz when you put your fingers over your dialysis fistula. °· The limb that was used for the procedure: °¨ Swells. °¨ Is painful. °¨ Is cold. °¨ Is discolored, such as blue or pale white. °  °This information is not intended  to replace advice given to you by your health care provider. Make sure you discuss any questions you have with your health care provider. °  °Document Released: 12/19/2013 Document Reviewed: 12/19/2013 °Elsevier Interactive Patient Education ©2016 [employer] Inc. ° °

## 2016-03-03 NOTE — Interval H&P Note (Signed)
History and Physical Interval Note:  03/03/2016 10:54 AM  Destiny Boyd  has presented today for surgery, with the diagnosis of pvd  The various methods of treatment have been discussed with the patient and family. After consideration of risks, benefits and other options for treatment, the patient has consented to  Procedure(s): Fistulagram (N/A) as a surgical intervention .  The patient's history has been reviewed, patient examined, no change in status, stable for surgery.  I have reviewed the patient's chart and labs.  Questions were answered to the patient's satisfaction.     Deitra Mayo

## 2016-03-04 DIAGNOSIS — N186 End stage renal disease: Secondary | ICD-10-CM | POA: Diagnosis not present

## 2016-03-04 DIAGNOSIS — D631 Anemia in chronic kidney disease: Secondary | ICD-10-CM | POA: Diagnosis not present

## 2016-03-04 DIAGNOSIS — Z992 Dependence on renal dialysis: Secondary | ICD-10-CM | POA: Diagnosis not present

## 2016-03-04 DIAGNOSIS — D509 Iron deficiency anemia, unspecified: Secondary | ICD-10-CM | POA: Diagnosis not present

## 2016-03-04 DIAGNOSIS — N2581 Secondary hyperparathyroidism of renal origin: Secondary | ICD-10-CM | POA: Diagnosis not present

## 2016-03-04 DIAGNOSIS — E162 Hypoglycemia, unspecified: Secondary | ICD-10-CM | POA: Diagnosis not present

## 2016-03-06 DIAGNOSIS — D631 Anemia in chronic kidney disease: Secondary | ICD-10-CM | POA: Diagnosis not present

## 2016-03-06 DIAGNOSIS — N186 End stage renal disease: Secondary | ICD-10-CM | POA: Diagnosis not present

## 2016-03-06 DIAGNOSIS — Z992 Dependence on renal dialysis: Secondary | ICD-10-CM | POA: Diagnosis not present

## 2016-03-06 DIAGNOSIS — D509 Iron deficiency anemia, unspecified: Secondary | ICD-10-CM | POA: Diagnosis not present

## 2016-03-06 DIAGNOSIS — E162 Hypoglycemia, unspecified: Secondary | ICD-10-CM | POA: Diagnosis not present

## 2016-03-06 DIAGNOSIS — N2581 Secondary hyperparathyroidism of renal origin: Secondary | ICD-10-CM | POA: Diagnosis not present

## 2016-03-08 DIAGNOSIS — N2581 Secondary hyperparathyroidism of renal origin: Secondary | ICD-10-CM | POA: Diagnosis not present

## 2016-03-08 DIAGNOSIS — E162 Hypoglycemia, unspecified: Secondary | ICD-10-CM | POA: Diagnosis not present

## 2016-03-08 DIAGNOSIS — N186 End stage renal disease: Secondary | ICD-10-CM | POA: Diagnosis not present

## 2016-03-08 DIAGNOSIS — D509 Iron deficiency anemia, unspecified: Secondary | ICD-10-CM | POA: Diagnosis not present

## 2016-03-08 DIAGNOSIS — Z992 Dependence on renal dialysis: Secondary | ICD-10-CM | POA: Diagnosis not present

## 2016-03-08 DIAGNOSIS — D631 Anemia in chronic kidney disease: Secondary | ICD-10-CM | POA: Diagnosis not present

## 2016-03-11 DIAGNOSIS — N186 End stage renal disease: Secondary | ICD-10-CM | POA: Diagnosis not present

## 2016-03-11 DIAGNOSIS — E162 Hypoglycemia, unspecified: Secondary | ICD-10-CM | POA: Diagnosis not present

## 2016-03-11 DIAGNOSIS — Z992 Dependence on renal dialysis: Secondary | ICD-10-CM | POA: Diagnosis not present

## 2016-03-11 DIAGNOSIS — D509 Iron deficiency anemia, unspecified: Secondary | ICD-10-CM | POA: Diagnosis not present

## 2016-03-11 DIAGNOSIS — D631 Anemia in chronic kidney disease: Secondary | ICD-10-CM | POA: Diagnosis not present

## 2016-03-11 DIAGNOSIS — N2581 Secondary hyperparathyroidism of renal origin: Secondary | ICD-10-CM | POA: Diagnosis not present

## 2016-03-13 DIAGNOSIS — D631 Anemia in chronic kidney disease: Secondary | ICD-10-CM | POA: Diagnosis not present

## 2016-03-13 DIAGNOSIS — E162 Hypoglycemia, unspecified: Secondary | ICD-10-CM | POA: Diagnosis not present

## 2016-03-13 DIAGNOSIS — N186 End stage renal disease: Secondary | ICD-10-CM | POA: Diagnosis not present

## 2016-03-13 DIAGNOSIS — N2581 Secondary hyperparathyroidism of renal origin: Secondary | ICD-10-CM | POA: Diagnosis not present

## 2016-03-13 DIAGNOSIS — D509 Iron deficiency anemia, unspecified: Secondary | ICD-10-CM | POA: Diagnosis not present

## 2016-03-13 DIAGNOSIS — Z992 Dependence on renal dialysis: Secondary | ICD-10-CM | POA: Diagnosis not present

## 2016-03-15 DIAGNOSIS — D631 Anemia in chronic kidney disease: Secondary | ICD-10-CM | POA: Diagnosis not present

## 2016-03-15 DIAGNOSIS — N186 End stage renal disease: Secondary | ICD-10-CM | POA: Diagnosis not present

## 2016-03-15 DIAGNOSIS — E162 Hypoglycemia, unspecified: Secondary | ICD-10-CM | POA: Diagnosis not present

## 2016-03-15 DIAGNOSIS — D509 Iron deficiency anemia, unspecified: Secondary | ICD-10-CM | POA: Diagnosis not present

## 2016-03-15 DIAGNOSIS — Z992 Dependence on renal dialysis: Secondary | ICD-10-CM | POA: Diagnosis not present

## 2016-03-15 DIAGNOSIS — N2581 Secondary hyperparathyroidism of renal origin: Secondary | ICD-10-CM | POA: Diagnosis not present

## 2016-03-17 DIAGNOSIS — Z992 Dependence on renal dialysis: Secondary | ICD-10-CM | POA: Diagnosis not present

## 2016-03-17 DIAGNOSIS — N186 End stage renal disease: Secondary | ICD-10-CM | POA: Diagnosis not present

## 2016-03-18 DIAGNOSIS — N2581 Secondary hyperparathyroidism of renal origin: Secondary | ICD-10-CM | POA: Diagnosis not present

## 2016-03-18 DIAGNOSIS — N186 End stage renal disease: Secondary | ICD-10-CM | POA: Diagnosis not present

## 2016-03-18 DIAGNOSIS — D509 Iron deficiency anemia, unspecified: Secondary | ICD-10-CM | POA: Diagnosis not present

## 2016-03-18 DIAGNOSIS — Z992 Dependence on renal dialysis: Secondary | ICD-10-CM | POA: Diagnosis not present

## 2016-03-18 DIAGNOSIS — D631 Anemia in chronic kidney disease: Secondary | ICD-10-CM | POA: Diagnosis not present

## 2016-03-18 DIAGNOSIS — E11649 Type 2 diabetes mellitus with hypoglycemia without coma: Secondary | ICD-10-CM | POA: Diagnosis not present

## 2016-03-18 DIAGNOSIS — E162 Hypoglycemia, unspecified: Secondary | ICD-10-CM | POA: Diagnosis not present

## 2016-03-20 DIAGNOSIS — E162 Hypoglycemia, unspecified: Secondary | ICD-10-CM | POA: Diagnosis not present

## 2016-03-20 DIAGNOSIS — Z992 Dependence on renal dialysis: Secondary | ICD-10-CM | POA: Diagnosis not present

## 2016-03-20 DIAGNOSIS — D631 Anemia in chronic kidney disease: Secondary | ICD-10-CM | POA: Diagnosis not present

## 2016-03-20 DIAGNOSIS — N186 End stage renal disease: Secondary | ICD-10-CM | POA: Diagnosis not present

## 2016-03-20 DIAGNOSIS — D509 Iron deficiency anemia, unspecified: Secondary | ICD-10-CM | POA: Diagnosis not present

## 2016-03-20 DIAGNOSIS — N2581 Secondary hyperparathyroidism of renal origin: Secondary | ICD-10-CM | POA: Diagnosis not present

## 2016-03-22 DIAGNOSIS — N186 End stage renal disease: Secondary | ICD-10-CM | POA: Diagnosis not present

## 2016-03-22 DIAGNOSIS — D631 Anemia in chronic kidney disease: Secondary | ICD-10-CM | POA: Diagnosis not present

## 2016-03-22 DIAGNOSIS — Z992 Dependence on renal dialysis: Secondary | ICD-10-CM | POA: Diagnosis not present

## 2016-03-22 DIAGNOSIS — E162 Hypoglycemia, unspecified: Secondary | ICD-10-CM | POA: Diagnosis not present

## 2016-03-22 DIAGNOSIS — D509 Iron deficiency anemia, unspecified: Secondary | ICD-10-CM | POA: Diagnosis not present

## 2016-03-22 DIAGNOSIS — N2581 Secondary hyperparathyroidism of renal origin: Secondary | ICD-10-CM | POA: Diagnosis not present

## 2016-03-25 DIAGNOSIS — Z992 Dependence on renal dialysis: Secondary | ICD-10-CM | POA: Diagnosis not present

## 2016-03-25 DIAGNOSIS — E162 Hypoglycemia, unspecified: Secondary | ICD-10-CM | POA: Diagnosis not present

## 2016-03-25 DIAGNOSIS — D631 Anemia in chronic kidney disease: Secondary | ICD-10-CM | POA: Diagnosis not present

## 2016-03-25 DIAGNOSIS — D509 Iron deficiency anemia, unspecified: Secondary | ICD-10-CM | POA: Diagnosis not present

## 2016-03-25 DIAGNOSIS — N186 End stage renal disease: Secondary | ICD-10-CM | POA: Diagnosis not present

## 2016-03-25 DIAGNOSIS — N2581 Secondary hyperparathyroidism of renal origin: Secondary | ICD-10-CM | POA: Diagnosis not present

## 2016-03-27 DIAGNOSIS — N186 End stage renal disease: Secondary | ICD-10-CM | POA: Diagnosis not present

## 2016-03-27 DIAGNOSIS — D631 Anemia in chronic kidney disease: Secondary | ICD-10-CM | POA: Diagnosis not present

## 2016-03-27 DIAGNOSIS — Z992 Dependence on renal dialysis: Secondary | ICD-10-CM | POA: Diagnosis not present

## 2016-03-27 DIAGNOSIS — E162 Hypoglycemia, unspecified: Secondary | ICD-10-CM | POA: Diagnosis not present

## 2016-03-27 DIAGNOSIS — D509 Iron deficiency anemia, unspecified: Secondary | ICD-10-CM | POA: Diagnosis not present

## 2016-03-27 DIAGNOSIS — N2581 Secondary hyperparathyroidism of renal origin: Secondary | ICD-10-CM | POA: Diagnosis not present

## 2016-03-29 DIAGNOSIS — N186 End stage renal disease: Secondary | ICD-10-CM | POA: Diagnosis not present

## 2016-03-29 DIAGNOSIS — N2581 Secondary hyperparathyroidism of renal origin: Secondary | ICD-10-CM | POA: Diagnosis not present

## 2016-03-29 DIAGNOSIS — D631 Anemia in chronic kidney disease: Secondary | ICD-10-CM | POA: Diagnosis not present

## 2016-03-29 DIAGNOSIS — E162 Hypoglycemia, unspecified: Secondary | ICD-10-CM | POA: Diagnosis not present

## 2016-03-29 DIAGNOSIS — D509 Iron deficiency anemia, unspecified: Secondary | ICD-10-CM | POA: Diagnosis not present

## 2016-03-29 DIAGNOSIS — Z992 Dependence on renal dialysis: Secondary | ICD-10-CM | POA: Diagnosis not present

## 2016-04-01 DIAGNOSIS — Z992 Dependence on renal dialysis: Secondary | ICD-10-CM | POA: Diagnosis not present

## 2016-04-01 DIAGNOSIS — E162 Hypoglycemia, unspecified: Secondary | ICD-10-CM | POA: Diagnosis not present

## 2016-04-01 DIAGNOSIS — D631 Anemia in chronic kidney disease: Secondary | ICD-10-CM | POA: Diagnosis not present

## 2016-04-01 DIAGNOSIS — N2581 Secondary hyperparathyroidism of renal origin: Secondary | ICD-10-CM | POA: Diagnosis not present

## 2016-04-01 DIAGNOSIS — N186 End stage renal disease: Secondary | ICD-10-CM | POA: Diagnosis not present

## 2016-04-01 DIAGNOSIS — D509 Iron deficiency anemia, unspecified: Secondary | ICD-10-CM | POA: Diagnosis not present

## 2016-04-03 DIAGNOSIS — E162 Hypoglycemia, unspecified: Secondary | ICD-10-CM | POA: Diagnosis not present

## 2016-04-03 DIAGNOSIS — D509 Iron deficiency anemia, unspecified: Secondary | ICD-10-CM | POA: Diagnosis not present

## 2016-04-03 DIAGNOSIS — N186 End stage renal disease: Secondary | ICD-10-CM | POA: Diagnosis not present

## 2016-04-03 DIAGNOSIS — D631 Anemia in chronic kidney disease: Secondary | ICD-10-CM | POA: Diagnosis not present

## 2016-04-03 DIAGNOSIS — N2581 Secondary hyperparathyroidism of renal origin: Secondary | ICD-10-CM | POA: Diagnosis not present

## 2016-04-03 DIAGNOSIS — Z992 Dependence on renal dialysis: Secondary | ICD-10-CM | POA: Diagnosis not present

## 2016-04-05 DIAGNOSIS — D509 Iron deficiency anemia, unspecified: Secondary | ICD-10-CM | POA: Diagnosis not present

## 2016-04-05 DIAGNOSIS — N2581 Secondary hyperparathyroidism of renal origin: Secondary | ICD-10-CM | POA: Diagnosis not present

## 2016-04-05 DIAGNOSIS — E162 Hypoglycemia, unspecified: Secondary | ICD-10-CM | POA: Diagnosis not present

## 2016-04-05 DIAGNOSIS — N186 End stage renal disease: Secondary | ICD-10-CM | POA: Diagnosis not present

## 2016-04-05 DIAGNOSIS — Z992 Dependence on renal dialysis: Secondary | ICD-10-CM | POA: Diagnosis not present

## 2016-04-05 DIAGNOSIS — D631 Anemia in chronic kidney disease: Secondary | ICD-10-CM | POA: Diagnosis not present

## 2016-04-08 DIAGNOSIS — D509 Iron deficiency anemia, unspecified: Secondary | ICD-10-CM | POA: Diagnosis not present

## 2016-04-08 DIAGNOSIS — E162 Hypoglycemia, unspecified: Secondary | ICD-10-CM | POA: Diagnosis not present

## 2016-04-08 DIAGNOSIS — D631 Anemia in chronic kidney disease: Secondary | ICD-10-CM | POA: Diagnosis not present

## 2016-04-08 DIAGNOSIS — N186 End stage renal disease: Secondary | ICD-10-CM | POA: Diagnosis not present

## 2016-04-08 DIAGNOSIS — Z992 Dependence on renal dialysis: Secondary | ICD-10-CM | POA: Diagnosis not present

## 2016-04-08 DIAGNOSIS — N2581 Secondary hyperparathyroidism of renal origin: Secondary | ICD-10-CM | POA: Diagnosis not present

## 2016-04-10 DIAGNOSIS — D631 Anemia in chronic kidney disease: Secondary | ICD-10-CM | POA: Diagnosis not present

## 2016-04-10 DIAGNOSIS — Z992 Dependence on renal dialysis: Secondary | ICD-10-CM | POA: Diagnosis not present

## 2016-04-10 DIAGNOSIS — D509 Iron deficiency anemia, unspecified: Secondary | ICD-10-CM | POA: Diagnosis not present

## 2016-04-10 DIAGNOSIS — N2581 Secondary hyperparathyroidism of renal origin: Secondary | ICD-10-CM | POA: Diagnosis not present

## 2016-04-10 DIAGNOSIS — N186 End stage renal disease: Secondary | ICD-10-CM | POA: Diagnosis not present

## 2016-04-10 DIAGNOSIS — E162 Hypoglycemia, unspecified: Secondary | ICD-10-CM | POA: Diagnosis not present

## 2016-04-12 DIAGNOSIS — N2581 Secondary hyperparathyroidism of renal origin: Secondary | ICD-10-CM | POA: Diagnosis not present

## 2016-04-12 DIAGNOSIS — E162 Hypoglycemia, unspecified: Secondary | ICD-10-CM | POA: Diagnosis not present

## 2016-04-12 DIAGNOSIS — Z992 Dependence on renal dialysis: Secondary | ICD-10-CM | POA: Diagnosis not present

## 2016-04-12 DIAGNOSIS — D509 Iron deficiency anemia, unspecified: Secondary | ICD-10-CM | POA: Diagnosis not present

## 2016-04-12 DIAGNOSIS — D631 Anemia in chronic kidney disease: Secondary | ICD-10-CM | POA: Diagnosis not present

## 2016-04-12 DIAGNOSIS — N186 End stage renal disease: Secondary | ICD-10-CM | POA: Diagnosis not present

## 2016-04-15 DIAGNOSIS — D509 Iron deficiency anemia, unspecified: Secondary | ICD-10-CM | POA: Diagnosis not present

## 2016-04-15 DIAGNOSIS — N186 End stage renal disease: Secondary | ICD-10-CM | POA: Diagnosis not present

## 2016-04-15 DIAGNOSIS — Z992 Dependence on renal dialysis: Secondary | ICD-10-CM | POA: Diagnosis not present

## 2016-04-15 DIAGNOSIS — D631 Anemia in chronic kidney disease: Secondary | ICD-10-CM | POA: Diagnosis not present

## 2016-04-15 DIAGNOSIS — E162 Hypoglycemia, unspecified: Secondary | ICD-10-CM | POA: Diagnosis not present

## 2016-04-15 DIAGNOSIS — N2581 Secondary hyperparathyroidism of renal origin: Secondary | ICD-10-CM | POA: Diagnosis not present

## 2016-04-17 DIAGNOSIS — Z992 Dependence on renal dialysis: Secondary | ICD-10-CM | POA: Diagnosis not present

## 2016-04-17 DIAGNOSIS — E162 Hypoglycemia, unspecified: Secondary | ICD-10-CM | POA: Diagnosis not present

## 2016-04-17 DIAGNOSIS — D631 Anemia in chronic kidney disease: Secondary | ICD-10-CM | POA: Diagnosis not present

## 2016-04-17 DIAGNOSIS — N186 End stage renal disease: Secondary | ICD-10-CM | POA: Diagnosis not present

## 2016-04-17 DIAGNOSIS — N2581 Secondary hyperparathyroidism of renal origin: Secondary | ICD-10-CM | POA: Diagnosis not present

## 2016-04-17 DIAGNOSIS — D509 Iron deficiency anemia, unspecified: Secondary | ICD-10-CM | POA: Diagnosis not present

## 2016-04-19 DIAGNOSIS — Z23 Encounter for immunization: Secondary | ICD-10-CM | POA: Diagnosis not present

## 2016-04-19 DIAGNOSIS — N186 End stage renal disease: Secondary | ICD-10-CM | POA: Diagnosis not present

## 2016-04-19 DIAGNOSIS — D509 Iron deficiency anemia, unspecified: Secondary | ICD-10-CM | POA: Diagnosis not present

## 2016-04-19 DIAGNOSIS — Z992 Dependence on renal dialysis: Secondary | ICD-10-CM | POA: Diagnosis not present

## 2016-04-19 DIAGNOSIS — E162 Hypoglycemia, unspecified: Secondary | ICD-10-CM | POA: Diagnosis not present

## 2016-04-19 DIAGNOSIS — N2581 Secondary hyperparathyroidism of renal origin: Secondary | ICD-10-CM | POA: Diagnosis not present

## 2016-04-19 DIAGNOSIS — D631 Anemia in chronic kidney disease: Secondary | ICD-10-CM | POA: Diagnosis not present

## 2016-04-19 DIAGNOSIS — E11649 Type 2 diabetes mellitus with hypoglycemia without coma: Secondary | ICD-10-CM | POA: Diagnosis not present

## 2016-04-22 DIAGNOSIS — Z23 Encounter for immunization: Secondary | ICD-10-CM | POA: Diagnosis not present

## 2016-04-22 DIAGNOSIS — D509 Iron deficiency anemia, unspecified: Secondary | ICD-10-CM | POA: Diagnosis not present

## 2016-04-22 DIAGNOSIS — D631 Anemia in chronic kidney disease: Secondary | ICD-10-CM | POA: Diagnosis not present

## 2016-04-22 DIAGNOSIS — E162 Hypoglycemia, unspecified: Secondary | ICD-10-CM | POA: Diagnosis not present

## 2016-04-22 DIAGNOSIS — N2581 Secondary hyperparathyroidism of renal origin: Secondary | ICD-10-CM | POA: Diagnosis not present

## 2016-04-22 DIAGNOSIS — N186 End stage renal disease: Secondary | ICD-10-CM | POA: Diagnosis not present

## 2016-04-24 DIAGNOSIS — D631 Anemia in chronic kidney disease: Secondary | ICD-10-CM | POA: Diagnosis not present

## 2016-04-24 DIAGNOSIS — N186 End stage renal disease: Secondary | ICD-10-CM | POA: Diagnosis not present

## 2016-04-24 DIAGNOSIS — E162 Hypoglycemia, unspecified: Secondary | ICD-10-CM | POA: Diagnosis not present

## 2016-04-24 DIAGNOSIS — Z23 Encounter for immunization: Secondary | ICD-10-CM | POA: Diagnosis not present

## 2016-04-24 DIAGNOSIS — N2581 Secondary hyperparathyroidism of renal origin: Secondary | ICD-10-CM | POA: Diagnosis not present

## 2016-04-24 DIAGNOSIS — D509 Iron deficiency anemia, unspecified: Secondary | ICD-10-CM | POA: Diagnosis not present

## 2016-04-26 DIAGNOSIS — N186 End stage renal disease: Secondary | ICD-10-CM | POA: Diagnosis not present

## 2016-04-26 DIAGNOSIS — N2581 Secondary hyperparathyroidism of renal origin: Secondary | ICD-10-CM | POA: Diagnosis not present

## 2016-04-26 DIAGNOSIS — D631 Anemia in chronic kidney disease: Secondary | ICD-10-CM | POA: Diagnosis not present

## 2016-04-26 DIAGNOSIS — Z23 Encounter for immunization: Secondary | ICD-10-CM | POA: Diagnosis not present

## 2016-04-26 DIAGNOSIS — E162 Hypoglycemia, unspecified: Secondary | ICD-10-CM | POA: Diagnosis not present

## 2016-04-26 DIAGNOSIS — D509 Iron deficiency anemia, unspecified: Secondary | ICD-10-CM | POA: Diagnosis not present

## 2016-04-28 DIAGNOSIS — Z23 Encounter for immunization: Secondary | ICD-10-CM | POA: Diagnosis not present

## 2016-04-28 DIAGNOSIS — N186 End stage renal disease: Secondary | ICD-10-CM | POA: Diagnosis not present

## 2016-04-28 DIAGNOSIS — E162 Hypoglycemia, unspecified: Secondary | ICD-10-CM | POA: Diagnosis not present

## 2016-04-28 DIAGNOSIS — D509 Iron deficiency anemia, unspecified: Secondary | ICD-10-CM | POA: Diagnosis not present

## 2016-04-28 DIAGNOSIS — N2581 Secondary hyperparathyroidism of renal origin: Secondary | ICD-10-CM | POA: Diagnosis not present

## 2016-04-28 DIAGNOSIS — D631 Anemia in chronic kidney disease: Secondary | ICD-10-CM | POA: Diagnosis not present

## 2016-05-01 DIAGNOSIS — N186 End stage renal disease: Secondary | ICD-10-CM | POA: Diagnosis not present

## 2016-05-01 DIAGNOSIS — N2581 Secondary hyperparathyroidism of renal origin: Secondary | ICD-10-CM | POA: Diagnosis not present

## 2016-05-01 DIAGNOSIS — Z23 Encounter for immunization: Secondary | ICD-10-CM | POA: Diagnosis not present

## 2016-05-01 DIAGNOSIS — E162 Hypoglycemia, unspecified: Secondary | ICD-10-CM | POA: Diagnosis not present

## 2016-05-01 DIAGNOSIS — D509 Iron deficiency anemia, unspecified: Secondary | ICD-10-CM | POA: Diagnosis not present

## 2016-05-01 DIAGNOSIS — D631 Anemia in chronic kidney disease: Secondary | ICD-10-CM | POA: Diagnosis not present

## 2016-05-03 DIAGNOSIS — N2581 Secondary hyperparathyroidism of renal origin: Secondary | ICD-10-CM | POA: Diagnosis not present

## 2016-05-03 DIAGNOSIS — D631 Anemia in chronic kidney disease: Secondary | ICD-10-CM | POA: Diagnosis not present

## 2016-05-03 DIAGNOSIS — D509 Iron deficiency anemia, unspecified: Secondary | ICD-10-CM | POA: Diagnosis not present

## 2016-05-03 DIAGNOSIS — E162 Hypoglycemia, unspecified: Secondary | ICD-10-CM | POA: Diagnosis not present

## 2016-05-03 DIAGNOSIS — Z23 Encounter for immunization: Secondary | ICD-10-CM | POA: Diagnosis not present

## 2016-05-03 DIAGNOSIS — N186 End stage renal disease: Secondary | ICD-10-CM | POA: Diagnosis not present

## 2016-05-06 DIAGNOSIS — D509 Iron deficiency anemia, unspecified: Secondary | ICD-10-CM | POA: Diagnosis not present

## 2016-05-06 DIAGNOSIS — N2581 Secondary hyperparathyroidism of renal origin: Secondary | ICD-10-CM | POA: Diagnosis not present

## 2016-05-06 DIAGNOSIS — E162 Hypoglycemia, unspecified: Secondary | ICD-10-CM | POA: Diagnosis not present

## 2016-05-06 DIAGNOSIS — N186 End stage renal disease: Secondary | ICD-10-CM | POA: Diagnosis not present

## 2016-05-06 DIAGNOSIS — D631 Anemia in chronic kidney disease: Secondary | ICD-10-CM | POA: Diagnosis not present

## 2016-05-06 DIAGNOSIS — Z23 Encounter for immunization: Secondary | ICD-10-CM | POA: Diagnosis not present

## 2016-05-08 DIAGNOSIS — E162 Hypoglycemia, unspecified: Secondary | ICD-10-CM | POA: Diagnosis not present

## 2016-05-08 DIAGNOSIS — Z23 Encounter for immunization: Secondary | ICD-10-CM | POA: Diagnosis not present

## 2016-05-08 DIAGNOSIS — D509 Iron deficiency anemia, unspecified: Secondary | ICD-10-CM | POA: Diagnosis not present

## 2016-05-08 DIAGNOSIS — D631 Anemia in chronic kidney disease: Secondary | ICD-10-CM | POA: Diagnosis not present

## 2016-05-08 DIAGNOSIS — N2581 Secondary hyperparathyroidism of renal origin: Secondary | ICD-10-CM | POA: Diagnosis not present

## 2016-05-08 DIAGNOSIS — N186 End stage renal disease: Secondary | ICD-10-CM | POA: Diagnosis not present

## 2016-05-10 DIAGNOSIS — N2581 Secondary hyperparathyroidism of renal origin: Secondary | ICD-10-CM | POA: Diagnosis not present

## 2016-05-10 DIAGNOSIS — Z23 Encounter for immunization: Secondary | ICD-10-CM | POA: Diagnosis not present

## 2016-05-10 DIAGNOSIS — N186 End stage renal disease: Secondary | ICD-10-CM | POA: Diagnosis not present

## 2016-05-10 DIAGNOSIS — D509 Iron deficiency anemia, unspecified: Secondary | ICD-10-CM | POA: Diagnosis not present

## 2016-05-10 DIAGNOSIS — E162 Hypoglycemia, unspecified: Secondary | ICD-10-CM | POA: Diagnosis not present

## 2016-05-10 DIAGNOSIS — D631 Anemia in chronic kidney disease: Secondary | ICD-10-CM | POA: Diagnosis not present

## 2016-05-13 DIAGNOSIS — E162 Hypoglycemia, unspecified: Secondary | ICD-10-CM | POA: Diagnosis not present

## 2016-05-13 DIAGNOSIS — D631 Anemia in chronic kidney disease: Secondary | ICD-10-CM | POA: Diagnosis not present

## 2016-05-13 DIAGNOSIS — D509 Iron deficiency anemia, unspecified: Secondary | ICD-10-CM | POA: Diagnosis not present

## 2016-05-13 DIAGNOSIS — N186 End stage renal disease: Secondary | ICD-10-CM | POA: Diagnosis not present

## 2016-05-13 DIAGNOSIS — N2581 Secondary hyperparathyroidism of renal origin: Secondary | ICD-10-CM | POA: Diagnosis not present

## 2016-05-13 DIAGNOSIS — Z23 Encounter for immunization: Secondary | ICD-10-CM | POA: Diagnosis not present

## 2016-05-14 DIAGNOSIS — I10 Essential (primary) hypertension: Secondary | ICD-10-CM | POA: Diagnosis not present

## 2016-05-14 DIAGNOSIS — N186 End stage renal disease: Secondary | ICD-10-CM | POA: Diagnosis not present

## 2016-05-14 DIAGNOSIS — E118 Type 2 diabetes mellitus with unspecified complications: Secondary | ICD-10-CM | POA: Diagnosis not present

## 2016-05-15 DIAGNOSIS — D509 Iron deficiency anemia, unspecified: Secondary | ICD-10-CM | POA: Diagnosis not present

## 2016-05-15 DIAGNOSIS — D631 Anemia in chronic kidney disease: Secondary | ICD-10-CM | POA: Diagnosis not present

## 2016-05-15 DIAGNOSIS — E162 Hypoglycemia, unspecified: Secondary | ICD-10-CM | POA: Diagnosis not present

## 2016-05-15 DIAGNOSIS — Z23 Encounter for immunization: Secondary | ICD-10-CM | POA: Diagnosis not present

## 2016-05-15 DIAGNOSIS — N186 End stage renal disease: Secondary | ICD-10-CM | POA: Diagnosis not present

## 2016-05-15 DIAGNOSIS — N2581 Secondary hyperparathyroidism of renal origin: Secondary | ICD-10-CM | POA: Diagnosis not present

## 2016-05-17 DIAGNOSIS — D509 Iron deficiency anemia, unspecified: Secondary | ICD-10-CM | POA: Diagnosis not present

## 2016-05-17 DIAGNOSIS — N2581 Secondary hyperparathyroidism of renal origin: Secondary | ICD-10-CM | POA: Diagnosis not present

## 2016-05-17 DIAGNOSIS — Z992 Dependence on renal dialysis: Secondary | ICD-10-CM | POA: Diagnosis not present

## 2016-05-17 DIAGNOSIS — N186 End stage renal disease: Secondary | ICD-10-CM | POA: Diagnosis not present

## 2016-05-17 DIAGNOSIS — Z23 Encounter for immunization: Secondary | ICD-10-CM | POA: Diagnosis not present

## 2016-05-17 DIAGNOSIS — E162 Hypoglycemia, unspecified: Secondary | ICD-10-CM | POA: Diagnosis not present

## 2016-05-17 DIAGNOSIS — D631 Anemia in chronic kidney disease: Secondary | ICD-10-CM | POA: Diagnosis not present

## 2016-05-20 DIAGNOSIS — N186 End stage renal disease: Secondary | ICD-10-CM | POA: Diagnosis not present

## 2016-05-20 DIAGNOSIS — Z992 Dependence on renal dialysis: Secondary | ICD-10-CM | POA: Diagnosis not present

## 2016-05-20 DIAGNOSIS — D631 Anemia in chronic kidney disease: Secondary | ICD-10-CM | POA: Diagnosis not present

## 2016-05-20 DIAGNOSIS — D509 Iron deficiency anemia, unspecified: Secondary | ICD-10-CM | POA: Diagnosis not present

## 2016-05-20 DIAGNOSIS — E162 Hypoglycemia, unspecified: Secondary | ICD-10-CM | POA: Diagnosis not present

## 2016-05-20 DIAGNOSIS — E11649 Type 2 diabetes mellitus with hypoglycemia without coma: Secondary | ICD-10-CM | POA: Diagnosis not present

## 2016-05-20 DIAGNOSIS — N2581 Secondary hyperparathyroidism of renal origin: Secondary | ICD-10-CM | POA: Diagnosis not present

## 2016-05-22 DIAGNOSIS — Z992 Dependence on renal dialysis: Secondary | ICD-10-CM | POA: Diagnosis not present

## 2016-05-22 DIAGNOSIS — D509 Iron deficiency anemia, unspecified: Secondary | ICD-10-CM | POA: Diagnosis not present

## 2016-05-22 DIAGNOSIS — D631 Anemia in chronic kidney disease: Secondary | ICD-10-CM | POA: Diagnosis not present

## 2016-05-22 DIAGNOSIS — N2581 Secondary hyperparathyroidism of renal origin: Secondary | ICD-10-CM | POA: Diagnosis not present

## 2016-05-22 DIAGNOSIS — E162 Hypoglycemia, unspecified: Secondary | ICD-10-CM | POA: Diagnosis not present

## 2016-05-22 DIAGNOSIS — N186 End stage renal disease: Secondary | ICD-10-CM | POA: Diagnosis not present

## 2016-05-24 DIAGNOSIS — N186 End stage renal disease: Secondary | ICD-10-CM | POA: Diagnosis not present

## 2016-05-24 DIAGNOSIS — D509 Iron deficiency anemia, unspecified: Secondary | ICD-10-CM | POA: Diagnosis not present

## 2016-05-24 DIAGNOSIS — Z992 Dependence on renal dialysis: Secondary | ICD-10-CM | POA: Diagnosis not present

## 2016-05-24 DIAGNOSIS — D631 Anemia in chronic kidney disease: Secondary | ICD-10-CM | POA: Diagnosis not present

## 2016-05-24 DIAGNOSIS — N2581 Secondary hyperparathyroidism of renal origin: Secondary | ICD-10-CM | POA: Diagnosis not present

## 2016-05-24 DIAGNOSIS — E162 Hypoglycemia, unspecified: Secondary | ICD-10-CM | POA: Diagnosis not present

## 2016-05-27 DIAGNOSIS — D631 Anemia in chronic kidney disease: Secondary | ICD-10-CM | POA: Diagnosis not present

## 2016-05-27 DIAGNOSIS — N2581 Secondary hyperparathyroidism of renal origin: Secondary | ICD-10-CM | POA: Diagnosis not present

## 2016-05-27 DIAGNOSIS — E162 Hypoglycemia, unspecified: Secondary | ICD-10-CM | POA: Diagnosis not present

## 2016-05-27 DIAGNOSIS — N186 End stage renal disease: Secondary | ICD-10-CM | POA: Diagnosis not present

## 2016-05-27 DIAGNOSIS — E119 Type 2 diabetes mellitus without complications: Secondary | ICD-10-CM | POA: Diagnosis not present

## 2016-05-27 DIAGNOSIS — D509 Iron deficiency anemia, unspecified: Secondary | ICD-10-CM | POA: Diagnosis not present

## 2016-05-27 DIAGNOSIS — Z992 Dependence on renal dialysis: Secondary | ICD-10-CM | POA: Diagnosis not present

## 2016-05-29 DIAGNOSIS — D509 Iron deficiency anemia, unspecified: Secondary | ICD-10-CM | POA: Diagnosis not present

## 2016-05-29 DIAGNOSIS — N2581 Secondary hyperparathyroidism of renal origin: Secondary | ICD-10-CM | POA: Diagnosis not present

## 2016-05-29 DIAGNOSIS — D631 Anemia in chronic kidney disease: Secondary | ICD-10-CM | POA: Diagnosis not present

## 2016-05-29 DIAGNOSIS — N186 End stage renal disease: Secondary | ICD-10-CM | POA: Diagnosis not present

## 2016-05-29 DIAGNOSIS — E162 Hypoglycemia, unspecified: Secondary | ICD-10-CM | POA: Diagnosis not present

## 2016-05-29 DIAGNOSIS — Z992 Dependence on renal dialysis: Secondary | ICD-10-CM | POA: Diagnosis not present

## 2016-05-31 DIAGNOSIS — E162 Hypoglycemia, unspecified: Secondary | ICD-10-CM | POA: Diagnosis not present

## 2016-05-31 DIAGNOSIS — D631 Anemia in chronic kidney disease: Secondary | ICD-10-CM | POA: Diagnosis not present

## 2016-05-31 DIAGNOSIS — D509 Iron deficiency anemia, unspecified: Secondary | ICD-10-CM | POA: Diagnosis not present

## 2016-05-31 DIAGNOSIS — N186 End stage renal disease: Secondary | ICD-10-CM | POA: Diagnosis not present

## 2016-05-31 DIAGNOSIS — N2581 Secondary hyperparathyroidism of renal origin: Secondary | ICD-10-CM | POA: Diagnosis not present

## 2016-05-31 DIAGNOSIS — Z992 Dependence on renal dialysis: Secondary | ICD-10-CM | POA: Diagnosis not present

## 2016-06-03 DIAGNOSIS — Z992 Dependence on renal dialysis: Secondary | ICD-10-CM | POA: Diagnosis not present

## 2016-06-03 DIAGNOSIS — N2581 Secondary hyperparathyroidism of renal origin: Secondary | ICD-10-CM | POA: Diagnosis not present

## 2016-06-03 DIAGNOSIS — D509 Iron deficiency anemia, unspecified: Secondary | ICD-10-CM | POA: Diagnosis not present

## 2016-06-03 DIAGNOSIS — N186 End stage renal disease: Secondary | ICD-10-CM | POA: Diagnosis not present

## 2016-06-03 DIAGNOSIS — E162 Hypoglycemia, unspecified: Secondary | ICD-10-CM | POA: Diagnosis not present

## 2016-06-03 DIAGNOSIS — D631 Anemia in chronic kidney disease: Secondary | ICD-10-CM | POA: Diagnosis not present

## 2016-06-05 DIAGNOSIS — N186 End stage renal disease: Secondary | ICD-10-CM | POA: Diagnosis not present

## 2016-06-05 DIAGNOSIS — D631 Anemia in chronic kidney disease: Secondary | ICD-10-CM | POA: Diagnosis not present

## 2016-06-05 DIAGNOSIS — D509 Iron deficiency anemia, unspecified: Secondary | ICD-10-CM | POA: Diagnosis not present

## 2016-06-05 DIAGNOSIS — E162 Hypoglycemia, unspecified: Secondary | ICD-10-CM | POA: Diagnosis not present

## 2016-06-05 DIAGNOSIS — N2581 Secondary hyperparathyroidism of renal origin: Secondary | ICD-10-CM | POA: Diagnosis not present

## 2016-06-05 DIAGNOSIS — Z992 Dependence on renal dialysis: Secondary | ICD-10-CM | POA: Diagnosis not present

## 2016-06-07 DIAGNOSIS — N2581 Secondary hyperparathyroidism of renal origin: Secondary | ICD-10-CM | POA: Diagnosis not present

## 2016-06-07 DIAGNOSIS — D631 Anemia in chronic kidney disease: Secondary | ICD-10-CM | POA: Diagnosis not present

## 2016-06-07 DIAGNOSIS — E162 Hypoglycemia, unspecified: Secondary | ICD-10-CM | POA: Diagnosis not present

## 2016-06-07 DIAGNOSIS — Z992 Dependence on renal dialysis: Secondary | ICD-10-CM | POA: Diagnosis not present

## 2016-06-07 DIAGNOSIS — N186 End stage renal disease: Secondary | ICD-10-CM | POA: Diagnosis not present

## 2016-06-07 DIAGNOSIS — D509 Iron deficiency anemia, unspecified: Secondary | ICD-10-CM | POA: Diagnosis not present

## 2016-06-10 DIAGNOSIS — D509 Iron deficiency anemia, unspecified: Secondary | ICD-10-CM | POA: Diagnosis not present

## 2016-06-10 DIAGNOSIS — E162 Hypoglycemia, unspecified: Secondary | ICD-10-CM | POA: Diagnosis not present

## 2016-06-10 DIAGNOSIS — N2581 Secondary hyperparathyroidism of renal origin: Secondary | ICD-10-CM | POA: Diagnosis not present

## 2016-06-10 DIAGNOSIS — Z992 Dependence on renal dialysis: Secondary | ICD-10-CM | POA: Diagnosis not present

## 2016-06-10 DIAGNOSIS — N186 End stage renal disease: Secondary | ICD-10-CM | POA: Diagnosis not present

## 2016-06-10 DIAGNOSIS — D631 Anemia in chronic kidney disease: Secondary | ICD-10-CM | POA: Diagnosis not present

## 2016-06-12 DIAGNOSIS — D509 Iron deficiency anemia, unspecified: Secondary | ICD-10-CM | POA: Diagnosis not present

## 2016-06-12 DIAGNOSIS — D631 Anemia in chronic kidney disease: Secondary | ICD-10-CM | POA: Diagnosis not present

## 2016-06-12 DIAGNOSIS — N2581 Secondary hyperparathyroidism of renal origin: Secondary | ICD-10-CM | POA: Diagnosis not present

## 2016-06-12 DIAGNOSIS — Z992 Dependence on renal dialysis: Secondary | ICD-10-CM | POA: Diagnosis not present

## 2016-06-12 DIAGNOSIS — E162 Hypoglycemia, unspecified: Secondary | ICD-10-CM | POA: Diagnosis not present

## 2016-06-12 DIAGNOSIS — N186 End stage renal disease: Secondary | ICD-10-CM | POA: Diagnosis not present

## 2016-06-14 DIAGNOSIS — N186 End stage renal disease: Secondary | ICD-10-CM | POA: Diagnosis not present

## 2016-06-14 DIAGNOSIS — N2581 Secondary hyperparathyroidism of renal origin: Secondary | ICD-10-CM | POA: Diagnosis not present

## 2016-06-14 DIAGNOSIS — D509 Iron deficiency anemia, unspecified: Secondary | ICD-10-CM | POA: Diagnosis not present

## 2016-06-14 DIAGNOSIS — Z992 Dependence on renal dialysis: Secondary | ICD-10-CM | POA: Diagnosis not present

## 2016-06-14 DIAGNOSIS — E162 Hypoglycemia, unspecified: Secondary | ICD-10-CM | POA: Diagnosis not present

## 2016-06-14 DIAGNOSIS — D631 Anemia in chronic kidney disease: Secondary | ICD-10-CM | POA: Diagnosis not present

## 2016-06-17 DIAGNOSIS — D631 Anemia in chronic kidney disease: Secondary | ICD-10-CM | POA: Diagnosis not present

## 2016-06-17 DIAGNOSIS — N2581 Secondary hyperparathyroidism of renal origin: Secondary | ICD-10-CM | POA: Diagnosis not present

## 2016-06-17 DIAGNOSIS — Z992 Dependence on renal dialysis: Secondary | ICD-10-CM | POA: Diagnosis not present

## 2016-06-17 DIAGNOSIS — E162 Hypoglycemia, unspecified: Secondary | ICD-10-CM | POA: Diagnosis not present

## 2016-06-17 DIAGNOSIS — N186 End stage renal disease: Secondary | ICD-10-CM | POA: Diagnosis not present

## 2016-06-17 DIAGNOSIS — D509 Iron deficiency anemia, unspecified: Secondary | ICD-10-CM | POA: Diagnosis not present

## 2016-06-19 DIAGNOSIS — N186 End stage renal disease: Secondary | ICD-10-CM | POA: Diagnosis not present

## 2016-06-19 DIAGNOSIS — N2581 Secondary hyperparathyroidism of renal origin: Secondary | ICD-10-CM | POA: Diagnosis not present

## 2016-06-19 DIAGNOSIS — D631 Anemia in chronic kidney disease: Secondary | ICD-10-CM | POA: Diagnosis not present

## 2016-06-19 DIAGNOSIS — D509 Iron deficiency anemia, unspecified: Secondary | ICD-10-CM | POA: Diagnosis not present

## 2016-06-19 DIAGNOSIS — Z992 Dependence on renal dialysis: Secondary | ICD-10-CM | POA: Diagnosis not present

## 2016-06-21 DIAGNOSIS — Z992 Dependence on renal dialysis: Secondary | ICD-10-CM | POA: Diagnosis not present

## 2016-06-21 DIAGNOSIS — D509 Iron deficiency anemia, unspecified: Secondary | ICD-10-CM | POA: Diagnosis not present

## 2016-06-21 DIAGNOSIS — N2581 Secondary hyperparathyroidism of renal origin: Secondary | ICD-10-CM | POA: Diagnosis not present

## 2016-06-21 DIAGNOSIS — N186 End stage renal disease: Secondary | ICD-10-CM | POA: Diagnosis not present

## 2016-06-21 DIAGNOSIS — D631 Anemia in chronic kidney disease: Secondary | ICD-10-CM | POA: Diagnosis not present

## 2016-06-24 DIAGNOSIS — N186 End stage renal disease: Secondary | ICD-10-CM | POA: Diagnosis not present

## 2016-06-24 DIAGNOSIS — N2581 Secondary hyperparathyroidism of renal origin: Secondary | ICD-10-CM | POA: Diagnosis not present

## 2016-06-24 DIAGNOSIS — D631 Anemia in chronic kidney disease: Secondary | ICD-10-CM | POA: Diagnosis not present

## 2016-06-24 DIAGNOSIS — Z992 Dependence on renal dialysis: Secondary | ICD-10-CM | POA: Diagnosis not present

## 2016-06-24 DIAGNOSIS — D509 Iron deficiency anemia, unspecified: Secondary | ICD-10-CM | POA: Diagnosis not present

## 2016-06-26 DIAGNOSIS — D631 Anemia in chronic kidney disease: Secondary | ICD-10-CM | POA: Diagnosis not present

## 2016-06-26 DIAGNOSIS — N2581 Secondary hyperparathyroidism of renal origin: Secondary | ICD-10-CM | POA: Diagnosis not present

## 2016-06-26 DIAGNOSIS — N186 End stage renal disease: Secondary | ICD-10-CM | POA: Diagnosis not present

## 2016-06-26 DIAGNOSIS — Z992 Dependence on renal dialysis: Secondary | ICD-10-CM | POA: Diagnosis not present

## 2016-06-26 DIAGNOSIS — D509 Iron deficiency anemia, unspecified: Secondary | ICD-10-CM | POA: Diagnosis not present

## 2016-06-28 DIAGNOSIS — N2581 Secondary hyperparathyroidism of renal origin: Secondary | ICD-10-CM | POA: Diagnosis not present

## 2016-06-28 DIAGNOSIS — N186 End stage renal disease: Secondary | ICD-10-CM | POA: Diagnosis not present

## 2016-06-28 DIAGNOSIS — D631 Anemia in chronic kidney disease: Secondary | ICD-10-CM | POA: Diagnosis not present

## 2016-06-28 DIAGNOSIS — D509 Iron deficiency anemia, unspecified: Secondary | ICD-10-CM | POA: Diagnosis not present

## 2016-06-28 DIAGNOSIS — Z992 Dependence on renal dialysis: Secondary | ICD-10-CM | POA: Diagnosis not present

## 2016-07-01 DIAGNOSIS — D631 Anemia in chronic kidney disease: Secondary | ICD-10-CM | POA: Diagnosis not present

## 2016-07-01 DIAGNOSIS — N186 End stage renal disease: Secondary | ICD-10-CM | POA: Diagnosis not present

## 2016-07-01 DIAGNOSIS — Z992 Dependence on renal dialysis: Secondary | ICD-10-CM | POA: Diagnosis not present

## 2016-07-01 DIAGNOSIS — D509 Iron deficiency anemia, unspecified: Secondary | ICD-10-CM | POA: Diagnosis not present

## 2016-07-01 DIAGNOSIS — N2581 Secondary hyperparathyroidism of renal origin: Secondary | ICD-10-CM | POA: Diagnosis not present

## 2016-07-03 DIAGNOSIS — N2581 Secondary hyperparathyroidism of renal origin: Secondary | ICD-10-CM | POA: Diagnosis not present

## 2016-07-03 DIAGNOSIS — Z992 Dependence on renal dialysis: Secondary | ICD-10-CM | POA: Diagnosis not present

## 2016-07-03 DIAGNOSIS — N186 End stage renal disease: Secondary | ICD-10-CM | POA: Diagnosis not present

## 2016-07-03 DIAGNOSIS — D631 Anemia in chronic kidney disease: Secondary | ICD-10-CM | POA: Diagnosis not present

## 2016-07-03 DIAGNOSIS — D509 Iron deficiency anemia, unspecified: Secondary | ICD-10-CM | POA: Diagnosis not present

## 2016-07-05 DIAGNOSIS — N186 End stage renal disease: Secondary | ICD-10-CM | POA: Diagnosis not present

## 2016-07-05 DIAGNOSIS — N2581 Secondary hyperparathyroidism of renal origin: Secondary | ICD-10-CM | POA: Diagnosis not present

## 2016-07-05 DIAGNOSIS — D509 Iron deficiency anemia, unspecified: Secondary | ICD-10-CM | POA: Diagnosis not present

## 2016-07-05 DIAGNOSIS — D631 Anemia in chronic kidney disease: Secondary | ICD-10-CM | POA: Diagnosis not present

## 2016-07-05 DIAGNOSIS — Z992 Dependence on renal dialysis: Secondary | ICD-10-CM | POA: Diagnosis not present

## 2016-07-08 DIAGNOSIS — N186 End stage renal disease: Secondary | ICD-10-CM | POA: Diagnosis not present

## 2016-07-08 DIAGNOSIS — D631 Anemia in chronic kidney disease: Secondary | ICD-10-CM | POA: Diagnosis not present

## 2016-07-08 DIAGNOSIS — N2581 Secondary hyperparathyroidism of renal origin: Secondary | ICD-10-CM | POA: Diagnosis not present

## 2016-07-08 DIAGNOSIS — D509 Iron deficiency anemia, unspecified: Secondary | ICD-10-CM | POA: Diagnosis not present

## 2016-07-08 DIAGNOSIS — Z992 Dependence on renal dialysis: Secondary | ICD-10-CM | POA: Diagnosis not present

## 2016-07-10 DIAGNOSIS — D509 Iron deficiency anemia, unspecified: Secondary | ICD-10-CM | POA: Diagnosis not present

## 2016-07-10 DIAGNOSIS — D631 Anemia in chronic kidney disease: Secondary | ICD-10-CM | POA: Diagnosis not present

## 2016-07-10 DIAGNOSIS — N2581 Secondary hyperparathyroidism of renal origin: Secondary | ICD-10-CM | POA: Diagnosis not present

## 2016-07-10 DIAGNOSIS — N186 End stage renal disease: Secondary | ICD-10-CM | POA: Diagnosis not present

## 2016-07-10 DIAGNOSIS — Z992 Dependence on renal dialysis: Secondary | ICD-10-CM | POA: Diagnosis not present

## 2016-07-12 DIAGNOSIS — D631 Anemia in chronic kidney disease: Secondary | ICD-10-CM | POA: Diagnosis not present

## 2016-07-12 DIAGNOSIS — N2581 Secondary hyperparathyroidism of renal origin: Secondary | ICD-10-CM | POA: Diagnosis not present

## 2016-07-12 DIAGNOSIS — D509 Iron deficiency anemia, unspecified: Secondary | ICD-10-CM | POA: Diagnosis not present

## 2016-07-12 DIAGNOSIS — N186 End stage renal disease: Secondary | ICD-10-CM | POA: Diagnosis not present

## 2016-07-12 DIAGNOSIS — Z992 Dependence on renal dialysis: Secondary | ICD-10-CM | POA: Diagnosis not present

## 2016-07-15 DIAGNOSIS — N2581 Secondary hyperparathyroidism of renal origin: Secondary | ICD-10-CM | POA: Diagnosis not present

## 2016-07-15 DIAGNOSIS — N186 End stage renal disease: Secondary | ICD-10-CM | POA: Diagnosis not present

## 2016-07-15 DIAGNOSIS — D509 Iron deficiency anemia, unspecified: Secondary | ICD-10-CM | POA: Diagnosis not present

## 2016-07-15 DIAGNOSIS — Z992 Dependence on renal dialysis: Secondary | ICD-10-CM | POA: Diagnosis not present

## 2016-07-15 DIAGNOSIS — D631 Anemia in chronic kidney disease: Secondary | ICD-10-CM | POA: Diagnosis not present

## 2016-07-17 DIAGNOSIS — D509 Iron deficiency anemia, unspecified: Secondary | ICD-10-CM | POA: Diagnosis not present

## 2016-07-17 DIAGNOSIS — D631 Anemia in chronic kidney disease: Secondary | ICD-10-CM | POA: Diagnosis not present

## 2016-07-17 DIAGNOSIS — N2581 Secondary hyperparathyroidism of renal origin: Secondary | ICD-10-CM | POA: Diagnosis not present

## 2016-07-17 DIAGNOSIS — Z992 Dependence on renal dialysis: Secondary | ICD-10-CM | POA: Diagnosis not present

## 2016-07-17 DIAGNOSIS — N186 End stage renal disease: Secondary | ICD-10-CM | POA: Diagnosis not present

## 2016-07-19 DIAGNOSIS — N2581 Secondary hyperparathyroidism of renal origin: Secondary | ICD-10-CM | POA: Diagnosis not present

## 2016-07-19 DIAGNOSIS — D509 Iron deficiency anemia, unspecified: Secondary | ICD-10-CM | POA: Diagnosis not present

## 2016-07-19 DIAGNOSIS — E162 Hypoglycemia, unspecified: Secondary | ICD-10-CM | POA: Diagnosis not present

## 2016-07-19 DIAGNOSIS — N186 End stage renal disease: Secondary | ICD-10-CM | POA: Diagnosis not present

## 2016-07-19 DIAGNOSIS — Z992 Dependence on renal dialysis: Secondary | ICD-10-CM | POA: Diagnosis not present

## 2016-07-19 DIAGNOSIS — E11649 Type 2 diabetes mellitus with hypoglycemia without coma: Secondary | ICD-10-CM | POA: Diagnosis not present

## 2016-07-19 DIAGNOSIS — D631 Anemia in chronic kidney disease: Secondary | ICD-10-CM | POA: Diagnosis not present

## 2016-07-22 DIAGNOSIS — D509 Iron deficiency anemia, unspecified: Secondary | ICD-10-CM | POA: Diagnosis not present

## 2016-07-22 DIAGNOSIS — D631 Anemia in chronic kidney disease: Secondary | ICD-10-CM | POA: Diagnosis not present

## 2016-07-22 DIAGNOSIS — Z992 Dependence on renal dialysis: Secondary | ICD-10-CM | POA: Diagnosis not present

## 2016-07-22 DIAGNOSIS — N2581 Secondary hyperparathyroidism of renal origin: Secondary | ICD-10-CM | POA: Diagnosis not present

## 2016-07-22 DIAGNOSIS — N186 End stage renal disease: Secondary | ICD-10-CM | POA: Diagnosis not present

## 2016-07-22 DIAGNOSIS — E162 Hypoglycemia, unspecified: Secondary | ICD-10-CM | POA: Diagnosis not present

## 2016-07-24 DIAGNOSIS — E162 Hypoglycemia, unspecified: Secondary | ICD-10-CM | POA: Diagnosis not present

## 2016-07-24 DIAGNOSIS — D509 Iron deficiency anemia, unspecified: Secondary | ICD-10-CM | POA: Diagnosis not present

## 2016-07-24 DIAGNOSIS — D631 Anemia in chronic kidney disease: Secondary | ICD-10-CM | POA: Diagnosis not present

## 2016-07-24 DIAGNOSIS — N2581 Secondary hyperparathyroidism of renal origin: Secondary | ICD-10-CM | POA: Diagnosis not present

## 2016-07-24 DIAGNOSIS — N186 End stage renal disease: Secondary | ICD-10-CM | POA: Diagnosis not present

## 2016-07-24 DIAGNOSIS — Z992 Dependence on renal dialysis: Secondary | ICD-10-CM | POA: Diagnosis not present

## 2016-07-26 DIAGNOSIS — E162 Hypoglycemia, unspecified: Secondary | ICD-10-CM | POA: Diagnosis not present

## 2016-07-26 DIAGNOSIS — D509 Iron deficiency anemia, unspecified: Secondary | ICD-10-CM | POA: Diagnosis not present

## 2016-07-26 DIAGNOSIS — N186 End stage renal disease: Secondary | ICD-10-CM | POA: Diagnosis not present

## 2016-07-26 DIAGNOSIS — N2581 Secondary hyperparathyroidism of renal origin: Secondary | ICD-10-CM | POA: Diagnosis not present

## 2016-07-26 DIAGNOSIS — D631 Anemia in chronic kidney disease: Secondary | ICD-10-CM | POA: Diagnosis not present

## 2016-07-26 DIAGNOSIS — Z992 Dependence on renal dialysis: Secondary | ICD-10-CM | POA: Diagnosis not present

## 2016-07-29 DIAGNOSIS — Z992 Dependence on renal dialysis: Secondary | ICD-10-CM | POA: Diagnosis not present

## 2016-07-29 DIAGNOSIS — N2581 Secondary hyperparathyroidism of renal origin: Secondary | ICD-10-CM | POA: Diagnosis not present

## 2016-07-29 DIAGNOSIS — D631 Anemia in chronic kidney disease: Secondary | ICD-10-CM | POA: Diagnosis not present

## 2016-07-29 DIAGNOSIS — D509 Iron deficiency anemia, unspecified: Secondary | ICD-10-CM | POA: Diagnosis not present

## 2016-07-29 DIAGNOSIS — E162 Hypoglycemia, unspecified: Secondary | ICD-10-CM | POA: Diagnosis not present

## 2016-07-29 DIAGNOSIS — N186 End stage renal disease: Secondary | ICD-10-CM | POA: Diagnosis not present

## 2016-07-31 DIAGNOSIS — Z992 Dependence on renal dialysis: Secondary | ICD-10-CM | POA: Diagnosis not present

## 2016-07-31 DIAGNOSIS — D631 Anemia in chronic kidney disease: Secondary | ICD-10-CM | POA: Diagnosis not present

## 2016-07-31 DIAGNOSIS — E162 Hypoglycemia, unspecified: Secondary | ICD-10-CM | POA: Diagnosis not present

## 2016-07-31 DIAGNOSIS — D509 Iron deficiency anemia, unspecified: Secondary | ICD-10-CM | POA: Diagnosis not present

## 2016-07-31 DIAGNOSIS — N186 End stage renal disease: Secondary | ICD-10-CM | POA: Diagnosis not present

## 2016-07-31 DIAGNOSIS — N2581 Secondary hyperparathyroidism of renal origin: Secondary | ICD-10-CM | POA: Diagnosis not present

## 2016-08-02 DIAGNOSIS — D509 Iron deficiency anemia, unspecified: Secondary | ICD-10-CM | POA: Diagnosis not present

## 2016-08-02 DIAGNOSIS — N186 End stage renal disease: Secondary | ICD-10-CM | POA: Diagnosis not present

## 2016-08-02 DIAGNOSIS — E162 Hypoglycemia, unspecified: Secondary | ICD-10-CM | POA: Diagnosis not present

## 2016-08-02 DIAGNOSIS — Z992 Dependence on renal dialysis: Secondary | ICD-10-CM | POA: Diagnosis not present

## 2016-08-02 DIAGNOSIS — D631 Anemia in chronic kidney disease: Secondary | ICD-10-CM | POA: Diagnosis not present

## 2016-08-02 DIAGNOSIS — N2581 Secondary hyperparathyroidism of renal origin: Secondary | ICD-10-CM | POA: Diagnosis not present

## 2016-08-05 DIAGNOSIS — N186 End stage renal disease: Secondary | ICD-10-CM | POA: Diagnosis not present

## 2016-08-05 DIAGNOSIS — N2581 Secondary hyperparathyroidism of renal origin: Secondary | ICD-10-CM | POA: Diagnosis not present

## 2016-08-05 DIAGNOSIS — E162 Hypoglycemia, unspecified: Secondary | ICD-10-CM | POA: Diagnosis not present

## 2016-08-05 DIAGNOSIS — D631 Anemia in chronic kidney disease: Secondary | ICD-10-CM | POA: Diagnosis not present

## 2016-08-05 DIAGNOSIS — Z992 Dependence on renal dialysis: Secondary | ICD-10-CM | POA: Diagnosis not present

## 2016-08-05 DIAGNOSIS — D509 Iron deficiency anemia, unspecified: Secondary | ICD-10-CM | POA: Diagnosis not present

## 2016-08-06 DIAGNOSIS — E119 Type 2 diabetes mellitus without complications: Secondary | ICD-10-CM | POA: Diagnosis not present

## 2016-08-06 DIAGNOSIS — N186 End stage renal disease: Secondary | ICD-10-CM | POA: Diagnosis not present

## 2016-08-07 DIAGNOSIS — E162 Hypoglycemia, unspecified: Secondary | ICD-10-CM | POA: Diagnosis not present

## 2016-08-07 DIAGNOSIS — Z992 Dependence on renal dialysis: Secondary | ICD-10-CM | POA: Diagnosis not present

## 2016-08-07 DIAGNOSIS — D631 Anemia in chronic kidney disease: Secondary | ICD-10-CM | POA: Diagnosis not present

## 2016-08-07 DIAGNOSIS — D509 Iron deficiency anemia, unspecified: Secondary | ICD-10-CM | POA: Diagnosis not present

## 2016-08-07 DIAGNOSIS — N186 End stage renal disease: Secondary | ICD-10-CM | POA: Diagnosis not present

## 2016-08-07 DIAGNOSIS — N2581 Secondary hyperparathyroidism of renal origin: Secondary | ICD-10-CM | POA: Diagnosis not present

## 2016-08-09 DIAGNOSIS — Z992 Dependence on renal dialysis: Secondary | ICD-10-CM | POA: Diagnosis not present

## 2016-08-09 DIAGNOSIS — N186 End stage renal disease: Secondary | ICD-10-CM | POA: Diagnosis not present

## 2016-08-09 DIAGNOSIS — N2581 Secondary hyperparathyroidism of renal origin: Secondary | ICD-10-CM | POA: Diagnosis not present

## 2016-08-09 DIAGNOSIS — D509 Iron deficiency anemia, unspecified: Secondary | ICD-10-CM | POA: Diagnosis not present

## 2016-08-09 DIAGNOSIS — D631 Anemia in chronic kidney disease: Secondary | ICD-10-CM | POA: Diagnosis not present

## 2016-08-09 DIAGNOSIS — E162 Hypoglycemia, unspecified: Secondary | ICD-10-CM | POA: Diagnosis not present

## 2016-08-12 DIAGNOSIS — D631 Anemia in chronic kidney disease: Secondary | ICD-10-CM | POA: Diagnosis not present

## 2016-08-12 DIAGNOSIS — D509 Iron deficiency anemia, unspecified: Secondary | ICD-10-CM | POA: Diagnosis not present

## 2016-08-12 DIAGNOSIS — Z992 Dependence on renal dialysis: Secondary | ICD-10-CM | POA: Diagnosis not present

## 2016-08-12 DIAGNOSIS — N186 End stage renal disease: Secondary | ICD-10-CM | POA: Diagnosis not present

## 2016-08-12 DIAGNOSIS — E162 Hypoglycemia, unspecified: Secondary | ICD-10-CM | POA: Diagnosis not present

## 2016-08-12 DIAGNOSIS — N2581 Secondary hyperparathyroidism of renal origin: Secondary | ICD-10-CM | POA: Diagnosis not present

## 2016-08-14 DIAGNOSIS — E162 Hypoglycemia, unspecified: Secondary | ICD-10-CM | POA: Diagnosis not present

## 2016-08-14 DIAGNOSIS — N186 End stage renal disease: Secondary | ICD-10-CM | POA: Diagnosis not present

## 2016-08-14 DIAGNOSIS — N2581 Secondary hyperparathyroidism of renal origin: Secondary | ICD-10-CM | POA: Diagnosis not present

## 2016-08-14 DIAGNOSIS — Z992 Dependence on renal dialysis: Secondary | ICD-10-CM | POA: Diagnosis not present

## 2016-08-14 DIAGNOSIS — D509 Iron deficiency anemia, unspecified: Secondary | ICD-10-CM | POA: Diagnosis not present

## 2016-08-14 DIAGNOSIS — D631 Anemia in chronic kidney disease: Secondary | ICD-10-CM | POA: Diagnosis not present

## 2016-08-16 DIAGNOSIS — Z992 Dependence on renal dialysis: Secondary | ICD-10-CM | POA: Diagnosis not present

## 2016-08-16 DIAGNOSIS — D509 Iron deficiency anemia, unspecified: Secondary | ICD-10-CM | POA: Diagnosis not present

## 2016-08-16 DIAGNOSIS — N186 End stage renal disease: Secondary | ICD-10-CM | POA: Diagnosis not present

## 2016-08-16 DIAGNOSIS — D631 Anemia in chronic kidney disease: Secondary | ICD-10-CM | POA: Diagnosis not present

## 2016-08-16 DIAGNOSIS — N2581 Secondary hyperparathyroidism of renal origin: Secondary | ICD-10-CM | POA: Diagnosis not present

## 2016-08-16 DIAGNOSIS — E162 Hypoglycemia, unspecified: Secondary | ICD-10-CM | POA: Diagnosis not present

## 2016-08-17 DIAGNOSIS — N186 End stage renal disease: Secondary | ICD-10-CM | POA: Diagnosis not present

## 2016-08-17 DIAGNOSIS — Z992 Dependence on renal dialysis: Secondary | ICD-10-CM | POA: Diagnosis not present

## 2016-08-19 DIAGNOSIS — D509 Iron deficiency anemia, unspecified: Secondary | ICD-10-CM | POA: Diagnosis not present

## 2016-08-19 DIAGNOSIS — D631 Anemia in chronic kidney disease: Secondary | ICD-10-CM | POA: Diagnosis not present

## 2016-08-19 DIAGNOSIS — N2581 Secondary hyperparathyroidism of renal origin: Secondary | ICD-10-CM | POA: Diagnosis not present

## 2016-08-19 DIAGNOSIS — N186 End stage renal disease: Secondary | ICD-10-CM | POA: Diagnosis not present

## 2016-08-19 DIAGNOSIS — Z992 Dependence on renal dialysis: Secondary | ICD-10-CM | POA: Diagnosis not present

## 2016-08-21 DIAGNOSIS — D631 Anemia in chronic kidney disease: Secondary | ICD-10-CM | POA: Diagnosis not present

## 2016-08-21 DIAGNOSIS — N2581 Secondary hyperparathyroidism of renal origin: Secondary | ICD-10-CM | POA: Diagnosis not present

## 2016-08-21 DIAGNOSIS — Z992 Dependence on renal dialysis: Secondary | ICD-10-CM | POA: Diagnosis not present

## 2016-08-21 DIAGNOSIS — N186 End stage renal disease: Secondary | ICD-10-CM | POA: Diagnosis not present

## 2016-08-21 DIAGNOSIS — D509 Iron deficiency anemia, unspecified: Secondary | ICD-10-CM | POA: Diagnosis not present

## 2016-08-23 DIAGNOSIS — D631 Anemia in chronic kidney disease: Secondary | ICD-10-CM | POA: Diagnosis not present

## 2016-08-23 DIAGNOSIS — D509 Iron deficiency anemia, unspecified: Secondary | ICD-10-CM | POA: Diagnosis not present

## 2016-08-23 DIAGNOSIS — Z992 Dependence on renal dialysis: Secondary | ICD-10-CM | POA: Diagnosis not present

## 2016-08-23 DIAGNOSIS — N186 End stage renal disease: Secondary | ICD-10-CM | POA: Diagnosis not present

## 2016-08-23 DIAGNOSIS — N2581 Secondary hyperparathyroidism of renal origin: Secondary | ICD-10-CM | POA: Diagnosis not present

## 2016-08-26 DIAGNOSIS — E119 Type 2 diabetes mellitus without complications: Secondary | ICD-10-CM | POA: Diagnosis not present

## 2016-08-26 DIAGNOSIS — D509 Iron deficiency anemia, unspecified: Secondary | ICD-10-CM | POA: Diagnosis not present

## 2016-08-26 DIAGNOSIS — D631 Anemia in chronic kidney disease: Secondary | ICD-10-CM | POA: Diagnosis not present

## 2016-08-26 DIAGNOSIS — N186 End stage renal disease: Secondary | ICD-10-CM | POA: Diagnosis not present

## 2016-08-26 DIAGNOSIS — N2581 Secondary hyperparathyroidism of renal origin: Secondary | ICD-10-CM | POA: Diagnosis not present

## 2016-08-26 DIAGNOSIS — Z992 Dependence on renal dialysis: Secondary | ICD-10-CM | POA: Diagnosis not present

## 2016-08-28 DIAGNOSIS — N186 End stage renal disease: Secondary | ICD-10-CM | POA: Diagnosis not present

## 2016-08-28 DIAGNOSIS — D631 Anemia in chronic kidney disease: Secondary | ICD-10-CM | POA: Diagnosis not present

## 2016-08-28 DIAGNOSIS — Z992 Dependence on renal dialysis: Secondary | ICD-10-CM | POA: Diagnosis not present

## 2016-08-28 DIAGNOSIS — D509 Iron deficiency anemia, unspecified: Secondary | ICD-10-CM | POA: Diagnosis not present

## 2016-08-28 DIAGNOSIS — N2581 Secondary hyperparathyroidism of renal origin: Secondary | ICD-10-CM | POA: Diagnosis not present

## 2016-08-30 DIAGNOSIS — N186 End stage renal disease: Secondary | ICD-10-CM | POA: Diagnosis not present

## 2016-08-30 DIAGNOSIS — Z992 Dependence on renal dialysis: Secondary | ICD-10-CM | POA: Diagnosis not present

## 2016-08-30 DIAGNOSIS — D509 Iron deficiency anemia, unspecified: Secondary | ICD-10-CM | POA: Diagnosis not present

## 2016-08-30 DIAGNOSIS — N2581 Secondary hyperparathyroidism of renal origin: Secondary | ICD-10-CM | POA: Diagnosis not present

## 2016-08-30 DIAGNOSIS — D631 Anemia in chronic kidney disease: Secondary | ICD-10-CM | POA: Diagnosis not present

## 2016-09-02 DIAGNOSIS — D509 Iron deficiency anemia, unspecified: Secondary | ICD-10-CM | POA: Diagnosis not present

## 2016-09-02 DIAGNOSIS — J040 Acute laryngitis: Secondary | ICD-10-CM | POA: Diagnosis not present

## 2016-09-02 DIAGNOSIS — D631 Anemia in chronic kidney disease: Secondary | ICD-10-CM | POA: Diagnosis not present

## 2016-09-02 DIAGNOSIS — M542 Cervicalgia: Secondary | ICD-10-CM | POA: Diagnosis not present

## 2016-09-02 DIAGNOSIS — N2581 Secondary hyperparathyroidism of renal origin: Secondary | ICD-10-CM | POA: Diagnosis not present

## 2016-09-02 DIAGNOSIS — E1165 Type 2 diabetes mellitus with hyperglycemia: Secondary | ICD-10-CM | POA: Diagnosis not present

## 2016-09-02 DIAGNOSIS — N186 End stage renal disease: Secondary | ICD-10-CM | POA: Diagnosis not present

## 2016-09-02 DIAGNOSIS — Z992 Dependence on renal dialysis: Secondary | ICD-10-CM | POA: Diagnosis not present

## 2016-09-04 DIAGNOSIS — N2581 Secondary hyperparathyroidism of renal origin: Secondary | ICD-10-CM | POA: Diagnosis not present

## 2016-09-04 DIAGNOSIS — Z992 Dependence on renal dialysis: Secondary | ICD-10-CM | POA: Diagnosis not present

## 2016-09-04 DIAGNOSIS — N186 End stage renal disease: Secondary | ICD-10-CM | POA: Diagnosis not present

## 2016-09-04 DIAGNOSIS — D631 Anemia in chronic kidney disease: Secondary | ICD-10-CM | POA: Diagnosis not present

## 2016-09-04 DIAGNOSIS — D509 Iron deficiency anemia, unspecified: Secondary | ICD-10-CM | POA: Diagnosis not present

## 2016-09-06 DIAGNOSIS — N186 End stage renal disease: Secondary | ICD-10-CM | POA: Diagnosis not present

## 2016-09-06 DIAGNOSIS — Z992 Dependence on renal dialysis: Secondary | ICD-10-CM | POA: Diagnosis not present

## 2016-09-06 DIAGNOSIS — D631 Anemia in chronic kidney disease: Secondary | ICD-10-CM | POA: Diagnosis not present

## 2016-09-06 DIAGNOSIS — N2581 Secondary hyperparathyroidism of renal origin: Secondary | ICD-10-CM | POA: Diagnosis not present

## 2016-09-06 DIAGNOSIS — D509 Iron deficiency anemia, unspecified: Secondary | ICD-10-CM | POA: Diagnosis not present

## 2016-09-09 DIAGNOSIS — N186 End stage renal disease: Secondary | ICD-10-CM | POA: Diagnosis not present

## 2016-09-09 DIAGNOSIS — Z992 Dependence on renal dialysis: Secondary | ICD-10-CM | POA: Diagnosis not present

## 2016-09-09 DIAGNOSIS — N2581 Secondary hyperparathyroidism of renal origin: Secondary | ICD-10-CM | POA: Diagnosis not present

## 2016-09-09 DIAGNOSIS — D509 Iron deficiency anemia, unspecified: Secondary | ICD-10-CM | POA: Diagnosis not present

## 2016-09-09 DIAGNOSIS — D631 Anemia in chronic kidney disease: Secondary | ICD-10-CM | POA: Diagnosis not present

## 2016-09-11 DIAGNOSIS — D631 Anemia in chronic kidney disease: Secondary | ICD-10-CM | POA: Diagnosis not present

## 2016-09-11 DIAGNOSIS — N186 End stage renal disease: Secondary | ICD-10-CM | POA: Diagnosis not present

## 2016-09-11 DIAGNOSIS — N2581 Secondary hyperparathyroidism of renal origin: Secondary | ICD-10-CM | POA: Diagnosis not present

## 2016-09-11 DIAGNOSIS — Z992 Dependence on renal dialysis: Secondary | ICD-10-CM | POA: Diagnosis not present

## 2016-09-11 DIAGNOSIS — D509 Iron deficiency anemia, unspecified: Secondary | ICD-10-CM | POA: Diagnosis not present

## 2016-09-13 DIAGNOSIS — Z992 Dependence on renal dialysis: Secondary | ICD-10-CM | POA: Diagnosis not present

## 2016-09-13 DIAGNOSIS — D631 Anemia in chronic kidney disease: Secondary | ICD-10-CM | POA: Diagnosis not present

## 2016-09-13 DIAGNOSIS — N186 End stage renal disease: Secondary | ICD-10-CM | POA: Diagnosis not present

## 2016-09-13 DIAGNOSIS — D509 Iron deficiency anemia, unspecified: Secondary | ICD-10-CM | POA: Diagnosis not present

## 2016-09-13 DIAGNOSIS — N2581 Secondary hyperparathyroidism of renal origin: Secondary | ICD-10-CM | POA: Diagnosis not present

## 2016-09-16 DIAGNOSIS — D631 Anemia in chronic kidney disease: Secondary | ICD-10-CM | POA: Diagnosis not present

## 2016-09-16 DIAGNOSIS — D509 Iron deficiency anemia, unspecified: Secondary | ICD-10-CM | POA: Diagnosis not present

## 2016-09-16 DIAGNOSIS — N2581 Secondary hyperparathyroidism of renal origin: Secondary | ICD-10-CM | POA: Diagnosis not present

## 2016-09-16 DIAGNOSIS — N186 End stage renal disease: Secondary | ICD-10-CM | POA: Diagnosis not present

## 2016-09-16 DIAGNOSIS — Z992 Dependence on renal dialysis: Secondary | ICD-10-CM | POA: Diagnosis not present

## 2016-09-17 DIAGNOSIS — Z992 Dependence on renal dialysis: Secondary | ICD-10-CM | POA: Diagnosis not present

## 2016-09-17 DIAGNOSIS — N186 End stage renal disease: Secondary | ICD-10-CM | POA: Diagnosis not present

## 2016-09-18 DIAGNOSIS — D509 Iron deficiency anemia, unspecified: Secondary | ICD-10-CM | POA: Diagnosis not present

## 2016-09-18 DIAGNOSIS — D631 Anemia in chronic kidney disease: Secondary | ICD-10-CM | POA: Diagnosis not present

## 2016-09-18 DIAGNOSIS — Z992 Dependence on renal dialysis: Secondary | ICD-10-CM | POA: Diagnosis not present

## 2016-09-18 DIAGNOSIS — N2581 Secondary hyperparathyroidism of renal origin: Secondary | ICD-10-CM | POA: Diagnosis not present

## 2016-09-18 DIAGNOSIS — E11649 Type 2 diabetes mellitus with hypoglycemia without coma: Secondary | ICD-10-CM | POA: Diagnosis not present

## 2016-09-18 DIAGNOSIS — E162 Hypoglycemia, unspecified: Secondary | ICD-10-CM | POA: Diagnosis not present

## 2016-09-18 DIAGNOSIS — N186 End stage renal disease: Secondary | ICD-10-CM | POA: Diagnosis not present

## 2016-09-20 DIAGNOSIS — N186 End stage renal disease: Secondary | ICD-10-CM | POA: Diagnosis not present

## 2016-09-20 DIAGNOSIS — N2581 Secondary hyperparathyroidism of renal origin: Secondary | ICD-10-CM | POA: Diagnosis not present

## 2016-09-20 DIAGNOSIS — D631 Anemia in chronic kidney disease: Secondary | ICD-10-CM | POA: Diagnosis not present

## 2016-09-20 DIAGNOSIS — D509 Iron deficiency anemia, unspecified: Secondary | ICD-10-CM | POA: Diagnosis not present

## 2016-09-20 DIAGNOSIS — E162 Hypoglycemia, unspecified: Secondary | ICD-10-CM | POA: Diagnosis not present

## 2016-09-20 DIAGNOSIS — Z992 Dependence on renal dialysis: Secondary | ICD-10-CM | POA: Diagnosis not present

## 2016-09-23 DIAGNOSIS — N2581 Secondary hyperparathyroidism of renal origin: Secondary | ICD-10-CM | POA: Diagnosis not present

## 2016-09-23 DIAGNOSIS — Z992 Dependence on renal dialysis: Secondary | ICD-10-CM | POA: Diagnosis not present

## 2016-09-23 DIAGNOSIS — D631 Anemia in chronic kidney disease: Secondary | ICD-10-CM | POA: Diagnosis not present

## 2016-09-23 DIAGNOSIS — N186 End stage renal disease: Secondary | ICD-10-CM | POA: Diagnosis not present

## 2016-09-23 DIAGNOSIS — E162 Hypoglycemia, unspecified: Secondary | ICD-10-CM | POA: Diagnosis not present

## 2016-09-23 DIAGNOSIS — D509 Iron deficiency anemia, unspecified: Secondary | ICD-10-CM | POA: Diagnosis not present

## 2016-09-25 DIAGNOSIS — N2581 Secondary hyperparathyroidism of renal origin: Secondary | ICD-10-CM | POA: Diagnosis not present

## 2016-09-25 DIAGNOSIS — Z992 Dependence on renal dialysis: Secondary | ICD-10-CM | POA: Diagnosis not present

## 2016-09-25 DIAGNOSIS — N186 End stage renal disease: Secondary | ICD-10-CM | POA: Diagnosis not present

## 2016-09-25 DIAGNOSIS — D509 Iron deficiency anemia, unspecified: Secondary | ICD-10-CM | POA: Diagnosis not present

## 2016-09-25 DIAGNOSIS — E162 Hypoglycemia, unspecified: Secondary | ICD-10-CM | POA: Diagnosis not present

## 2016-09-25 DIAGNOSIS — D631 Anemia in chronic kidney disease: Secondary | ICD-10-CM | POA: Diagnosis not present

## 2016-09-27 DIAGNOSIS — D509 Iron deficiency anemia, unspecified: Secondary | ICD-10-CM | POA: Diagnosis not present

## 2016-09-27 DIAGNOSIS — E162 Hypoglycemia, unspecified: Secondary | ICD-10-CM | POA: Diagnosis not present

## 2016-09-27 DIAGNOSIS — N186 End stage renal disease: Secondary | ICD-10-CM | POA: Diagnosis not present

## 2016-09-27 DIAGNOSIS — N2581 Secondary hyperparathyroidism of renal origin: Secondary | ICD-10-CM | POA: Diagnosis not present

## 2016-09-27 DIAGNOSIS — Z992 Dependence on renal dialysis: Secondary | ICD-10-CM | POA: Diagnosis not present

## 2016-09-27 DIAGNOSIS — D631 Anemia in chronic kidney disease: Secondary | ICD-10-CM | POA: Diagnosis not present

## 2016-09-30 DIAGNOSIS — N2581 Secondary hyperparathyroidism of renal origin: Secondary | ICD-10-CM | POA: Diagnosis not present

## 2016-09-30 DIAGNOSIS — Z992 Dependence on renal dialysis: Secondary | ICD-10-CM | POA: Diagnosis not present

## 2016-09-30 DIAGNOSIS — D509 Iron deficiency anemia, unspecified: Secondary | ICD-10-CM | POA: Diagnosis not present

## 2016-09-30 DIAGNOSIS — D631 Anemia in chronic kidney disease: Secondary | ICD-10-CM | POA: Diagnosis not present

## 2016-09-30 DIAGNOSIS — N186 End stage renal disease: Secondary | ICD-10-CM | POA: Diagnosis not present

## 2016-09-30 DIAGNOSIS — E162 Hypoglycemia, unspecified: Secondary | ICD-10-CM | POA: Diagnosis not present

## 2016-10-02 DIAGNOSIS — N2581 Secondary hyperparathyroidism of renal origin: Secondary | ICD-10-CM | POA: Diagnosis not present

## 2016-10-02 DIAGNOSIS — Z992 Dependence on renal dialysis: Secondary | ICD-10-CM | POA: Diagnosis not present

## 2016-10-02 DIAGNOSIS — D509 Iron deficiency anemia, unspecified: Secondary | ICD-10-CM | POA: Diagnosis not present

## 2016-10-02 DIAGNOSIS — N186 End stage renal disease: Secondary | ICD-10-CM | POA: Diagnosis not present

## 2016-10-02 DIAGNOSIS — E162 Hypoglycemia, unspecified: Secondary | ICD-10-CM | POA: Diagnosis not present

## 2016-10-02 DIAGNOSIS — D631 Anemia in chronic kidney disease: Secondary | ICD-10-CM | POA: Diagnosis not present

## 2016-10-04 DIAGNOSIS — N186 End stage renal disease: Secondary | ICD-10-CM | POA: Diagnosis not present

## 2016-10-04 DIAGNOSIS — D631 Anemia in chronic kidney disease: Secondary | ICD-10-CM | POA: Diagnosis not present

## 2016-10-04 DIAGNOSIS — D509 Iron deficiency anemia, unspecified: Secondary | ICD-10-CM | POA: Diagnosis not present

## 2016-10-04 DIAGNOSIS — N2581 Secondary hyperparathyroidism of renal origin: Secondary | ICD-10-CM | POA: Diagnosis not present

## 2016-10-04 DIAGNOSIS — Z992 Dependence on renal dialysis: Secondary | ICD-10-CM | POA: Diagnosis not present

## 2016-10-04 DIAGNOSIS — E162 Hypoglycemia, unspecified: Secondary | ICD-10-CM | POA: Diagnosis not present

## 2016-10-07 DIAGNOSIS — D631 Anemia in chronic kidney disease: Secondary | ICD-10-CM | POA: Diagnosis not present

## 2016-10-07 DIAGNOSIS — N2581 Secondary hyperparathyroidism of renal origin: Secondary | ICD-10-CM | POA: Diagnosis not present

## 2016-10-07 DIAGNOSIS — N186 End stage renal disease: Secondary | ICD-10-CM | POA: Diagnosis not present

## 2016-10-07 DIAGNOSIS — E162 Hypoglycemia, unspecified: Secondary | ICD-10-CM | POA: Diagnosis not present

## 2016-10-07 DIAGNOSIS — D509 Iron deficiency anemia, unspecified: Secondary | ICD-10-CM | POA: Diagnosis not present

## 2016-10-07 DIAGNOSIS — Z992 Dependence on renal dialysis: Secondary | ICD-10-CM | POA: Diagnosis not present

## 2016-10-09 DIAGNOSIS — Z992 Dependence on renal dialysis: Secondary | ICD-10-CM | POA: Diagnosis not present

## 2016-10-09 DIAGNOSIS — E162 Hypoglycemia, unspecified: Secondary | ICD-10-CM | POA: Diagnosis not present

## 2016-10-09 DIAGNOSIS — D509 Iron deficiency anemia, unspecified: Secondary | ICD-10-CM | POA: Diagnosis not present

## 2016-10-09 DIAGNOSIS — D631 Anemia in chronic kidney disease: Secondary | ICD-10-CM | POA: Diagnosis not present

## 2016-10-09 DIAGNOSIS — N2581 Secondary hyperparathyroidism of renal origin: Secondary | ICD-10-CM | POA: Diagnosis not present

## 2016-10-09 DIAGNOSIS — N186 End stage renal disease: Secondary | ICD-10-CM | POA: Diagnosis not present

## 2016-10-11 DIAGNOSIS — E162 Hypoglycemia, unspecified: Secondary | ICD-10-CM | POA: Diagnosis not present

## 2016-10-11 DIAGNOSIS — D509 Iron deficiency anemia, unspecified: Secondary | ICD-10-CM | POA: Diagnosis not present

## 2016-10-11 DIAGNOSIS — N186 End stage renal disease: Secondary | ICD-10-CM | POA: Diagnosis not present

## 2016-10-11 DIAGNOSIS — D631 Anemia in chronic kidney disease: Secondary | ICD-10-CM | POA: Diagnosis not present

## 2016-10-11 DIAGNOSIS — N2581 Secondary hyperparathyroidism of renal origin: Secondary | ICD-10-CM | POA: Diagnosis not present

## 2016-10-11 DIAGNOSIS — Z992 Dependence on renal dialysis: Secondary | ICD-10-CM | POA: Diagnosis not present

## 2016-10-14 DIAGNOSIS — E162 Hypoglycemia, unspecified: Secondary | ICD-10-CM | POA: Diagnosis not present

## 2016-10-14 DIAGNOSIS — N2581 Secondary hyperparathyroidism of renal origin: Secondary | ICD-10-CM | POA: Diagnosis not present

## 2016-10-14 DIAGNOSIS — D509 Iron deficiency anemia, unspecified: Secondary | ICD-10-CM | POA: Diagnosis not present

## 2016-10-14 DIAGNOSIS — Z992 Dependence on renal dialysis: Secondary | ICD-10-CM | POA: Diagnosis not present

## 2016-10-14 DIAGNOSIS — D631 Anemia in chronic kidney disease: Secondary | ICD-10-CM | POA: Diagnosis not present

## 2016-10-14 DIAGNOSIS — N186 End stage renal disease: Secondary | ICD-10-CM | POA: Diagnosis not present

## 2016-10-15 DIAGNOSIS — N186 End stage renal disease: Secondary | ICD-10-CM | POA: Diagnosis not present

## 2016-10-15 DIAGNOSIS — Z992 Dependence on renal dialysis: Secondary | ICD-10-CM | POA: Diagnosis not present

## 2016-10-16 DIAGNOSIS — D509 Iron deficiency anemia, unspecified: Secondary | ICD-10-CM | POA: Diagnosis not present

## 2016-10-16 DIAGNOSIS — Z992 Dependence on renal dialysis: Secondary | ICD-10-CM | POA: Diagnosis not present

## 2016-10-16 DIAGNOSIS — N2581 Secondary hyperparathyroidism of renal origin: Secondary | ICD-10-CM | POA: Diagnosis not present

## 2016-10-16 DIAGNOSIS — N186 End stage renal disease: Secondary | ICD-10-CM | POA: Diagnosis not present

## 2016-10-16 DIAGNOSIS — D631 Anemia in chronic kidney disease: Secondary | ICD-10-CM | POA: Diagnosis not present

## 2016-10-18 DIAGNOSIS — D631 Anemia in chronic kidney disease: Secondary | ICD-10-CM | POA: Diagnosis not present

## 2016-10-18 DIAGNOSIS — N186 End stage renal disease: Secondary | ICD-10-CM | POA: Diagnosis not present

## 2016-10-18 DIAGNOSIS — N2581 Secondary hyperparathyroidism of renal origin: Secondary | ICD-10-CM | POA: Diagnosis not present

## 2016-10-18 DIAGNOSIS — D509 Iron deficiency anemia, unspecified: Secondary | ICD-10-CM | POA: Diagnosis not present

## 2016-10-18 DIAGNOSIS — Z992 Dependence on renal dialysis: Secondary | ICD-10-CM | POA: Diagnosis not present

## 2016-10-21 DIAGNOSIS — Z992 Dependence on renal dialysis: Secondary | ICD-10-CM | POA: Diagnosis not present

## 2016-10-21 DIAGNOSIS — D509 Iron deficiency anemia, unspecified: Secondary | ICD-10-CM | POA: Diagnosis not present

## 2016-10-21 DIAGNOSIS — N2581 Secondary hyperparathyroidism of renal origin: Secondary | ICD-10-CM | POA: Diagnosis not present

## 2016-10-21 DIAGNOSIS — N186 End stage renal disease: Secondary | ICD-10-CM | POA: Diagnosis not present

## 2016-10-21 DIAGNOSIS — D631 Anemia in chronic kidney disease: Secondary | ICD-10-CM | POA: Diagnosis not present

## 2016-10-23 DIAGNOSIS — D631 Anemia in chronic kidney disease: Secondary | ICD-10-CM | POA: Diagnosis not present

## 2016-10-23 DIAGNOSIS — N186 End stage renal disease: Secondary | ICD-10-CM | POA: Diagnosis not present

## 2016-10-23 DIAGNOSIS — D509 Iron deficiency anemia, unspecified: Secondary | ICD-10-CM | POA: Diagnosis not present

## 2016-10-23 DIAGNOSIS — N2581 Secondary hyperparathyroidism of renal origin: Secondary | ICD-10-CM | POA: Diagnosis not present

## 2016-10-23 DIAGNOSIS — Z992 Dependence on renal dialysis: Secondary | ICD-10-CM | POA: Diagnosis not present

## 2016-10-25 DIAGNOSIS — D509 Iron deficiency anemia, unspecified: Secondary | ICD-10-CM | POA: Diagnosis not present

## 2016-10-25 DIAGNOSIS — D631 Anemia in chronic kidney disease: Secondary | ICD-10-CM | POA: Diagnosis not present

## 2016-10-25 DIAGNOSIS — N186 End stage renal disease: Secondary | ICD-10-CM | POA: Diagnosis not present

## 2016-10-25 DIAGNOSIS — Z992 Dependence on renal dialysis: Secondary | ICD-10-CM | POA: Diagnosis not present

## 2016-10-25 DIAGNOSIS — N2581 Secondary hyperparathyroidism of renal origin: Secondary | ICD-10-CM | POA: Diagnosis not present

## 2016-10-28 DIAGNOSIS — D631 Anemia in chronic kidney disease: Secondary | ICD-10-CM | POA: Diagnosis not present

## 2016-10-28 DIAGNOSIS — D509 Iron deficiency anemia, unspecified: Secondary | ICD-10-CM | POA: Diagnosis not present

## 2016-10-28 DIAGNOSIS — Z992 Dependence on renal dialysis: Secondary | ICD-10-CM | POA: Diagnosis not present

## 2016-10-28 DIAGNOSIS — N186 End stage renal disease: Secondary | ICD-10-CM | POA: Diagnosis not present

## 2016-10-28 DIAGNOSIS — N2581 Secondary hyperparathyroidism of renal origin: Secondary | ICD-10-CM | POA: Diagnosis not present

## 2016-10-30 DIAGNOSIS — N2581 Secondary hyperparathyroidism of renal origin: Secondary | ICD-10-CM | POA: Diagnosis not present

## 2016-10-30 DIAGNOSIS — D631 Anemia in chronic kidney disease: Secondary | ICD-10-CM | POA: Diagnosis not present

## 2016-10-30 DIAGNOSIS — D509 Iron deficiency anemia, unspecified: Secondary | ICD-10-CM | POA: Diagnosis not present

## 2016-10-30 DIAGNOSIS — Z992 Dependence on renal dialysis: Secondary | ICD-10-CM | POA: Diagnosis not present

## 2016-10-30 DIAGNOSIS — N186 End stage renal disease: Secondary | ICD-10-CM | POA: Diagnosis not present

## 2016-11-01 DIAGNOSIS — D631 Anemia in chronic kidney disease: Secondary | ICD-10-CM | POA: Diagnosis not present

## 2016-11-01 DIAGNOSIS — Z992 Dependence on renal dialysis: Secondary | ICD-10-CM | POA: Diagnosis not present

## 2016-11-01 DIAGNOSIS — N186 End stage renal disease: Secondary | ICD-10-CM | POA: Diagnosis not present

## 2016-11-01 DIAGNOSIS — N2581 Secondary hyperparathyroidism of renal origin: Secondary | ICD-10-CM | POA: Diagnosis not present

## 2016-11-01 DIAGNOSIS — D509 Iron deficiency anemia, unspecified: Secondary | ICD-10-CM | POA: Diagnosis not present

## 2016-11-04 DIAGNOSIS — D509 Iron deficiency anemia, unspecified: Secondary | ICD-10-CM | POA: Diagnosis not present

## 2016-11-04 DIAGNOSIS — D631 Anemia in chronic kidney disease: Secondary | ICD-10-CM | POA: Diagnosis not present

## 2016-11-04 DIAGNOSIS — N186 End stage renal disease: Secondary | ICD-10-CM | POA: Diagnosis not present

## 2016-11-04 DIAGNOSIS — Z992 Dependence on renal dialysis: Secondary | ICD-10-CM | POA: Diagnosis not present

## 2016-11-04 DIAGNOSIS — N2581 Secondary hyperparathyroidism of renal origin: Secondary | ICD-10-CM | POA: Diagnosis not present

## 2016-11-06 DIAGNOSIS — D509 Iron deficiency anemia, unspecified: Secondary | ICD-10-CM | POA: Diagnosis not present

## 2016-11-06 DIAGNOSIS — N2581 Secondary hyperparathyroidism of renal origin: Secondary | ICD-10-CM | POA: Diagnosis not present

## 2016-11-06 DIAGNOSIS — Z992 Dependence on renal dialysis: Secondary | ICD-10-CM | POA: Diagnosis not present

## 2016-11-06 DIAGNOSIS — N186 End stage renal disease: Secondary | ICD-10-CM | POA: Diagnosis not present

## 2016-11-06 DIAGNOSIS — D631 Anemia in chronic kidney disease: Secondary | ICD-10-CM | POA: Diagnosis not present

## 2016-11-07 DIAGNOSIS — N186 End stage renal disease: Secondary | ICD-10-CM | POA: Diagnosis not present

## 2016-11-07 DIAGNOSIS — M542 Cervicalgia: Secondary | ICD-10-CM | POA: Diagnosis not present

## 2016-11-08 DIAGNOSIS — D509 Iron deficiency anemia, unspecified: Secondary | ICD-10-CM | POA: Diagnosis not present

## 2016-11-08 DIAGNOSIS — N186 End stage renal disease: Secondary | ICD-10-CM | POA: Diagnosis not present

## 2016-11-08 DIAGNOSIS — N2581 Secondary hyperparathyroidism of renal origin: Secondary | ICD-10-CM | POA: Diagnosis not present

## 2016-11-08 DIAGNOSIS — Z992 Dependence on renal dialysis: Secondary | ICD-10-CM | POA: Diagnosis not present

## 2016-11-08 DIAGNOSIS — D631 Anemia in chronic kidney disease: Secondary | ICD-10-CM | POA: Diagnosis not present

## 2016-11-10 ENCOUNTER — Other Ambulatory Visit (HOSPITAL_COMMUNITY): Payer: Self-pay | Admitting: Internal Medicine

## 2016-11-10 ENCOUNTER — Ambulatory Visit (HOSPITAL_COMMUNITY)
Admission: RE | Admit: 2016-11-10 | Discharge: 2016-11-10 | Disposition: A | Discharge status: Home / Self Care | Payer: Medicare Other | Source: Ambulatory Visit | Attending: Internal Medicine | Admitting: Internal Medicine

## 2016-11-10 DIAGNOSIS — I6523 Occlusion and stenosis of bilateral carotid arteries: Secondary | ICD-10-CM | POA: Insufficient documentation

## 2016-11-10 DIAGNOSIS — M542 Cervicalgia: Secondary | ICD-10-CM | POA: Diagnosis not present

## 2016-11-10 DIAGNOSIS — R52 Pain, unspecified: Secondary | ICD-10-CM

## 2016-11-10 DIAGNOSIS — M47812 Spondylosis without myelopathy or radiculopathy, cervical region: Secondary | ICD-10-CM | POA: Insufficient documentation

## 2016-11-11 ENCOUNTER — Other Ambulatory Visit (HOSPITAL_COMMUNITY): Payer: Self-pay | Admitting: Internal Medicine

## 2016-11-11 DIAGNOSIS — R937 Abnormal findings on diagnostic imaging of other parts of musculoskeletal system: Secondary | ICD-10-CM

## 2016-11-11 DIAGNOSIS — D509 Iron deficiency anemia, unspecified: Secondary | ICD-10-CM | POA: Diagnosis not present

## 2016-11-11 DIAGNOSIS — N186 End stage renal disease: Secondary | ICD-10-CM | POA: Diagnosis not present

## 2016-11-11 DIAGNOSIS — N2581 Secondary hyperparathyroidism of renal origin: Secondary | ICD-10-CM | POA: Diagnosis not present

## 2016-11-11 DIAGNOSIS — D631 Anemia in chronic kidney disease: Secondary | ICD-10-CM | POA: Diagnosis not present

## 2016-11-11 DIAGNOSIS — Z992 Dependence on renal dialysis: Secondary | ICD-10-CM | POA: Diagnosis not present

## 2016-11-11 DIAGNOSIS — M542 Cervicalgia: Secondary | ICD-10-CM

## 2016-11-13 DIAGNOSIS — D631 Anemia in chronic kidney disease: Secondary | ICD-10-CM | POA: Diagnosis not present

## 2016-11-13 DIAGNOSIS — D509 Iron deficiency anemia, unspecified: Secondary | ICD-10-CM | POA: Diagnosis not present

## 2016-11-13 DIAGNOSIS — N2581 Secondary hyperparathyroidism of renal origin: Secondary | ICD-10-CM | POA: Diagnosis not present

## 2016-11-13 DIAGNOSIS — Z992 Dependence on renal dialysis: Secondary | ICD-10-CM | POA: Diagnosis not present

## 2016-11-13 DIAGNOSIS — N186 End stage renal disease: Secondary | ICD-10-CM | POA: Diagnosis not present

## 2016-11-15 DIAGNOSIS — N186 End stage renal disease: Secondary | ICD-10-CM | POA: Diagnosis not present

## 2016-11-15 DIAGNOSIS — N2581 Secondary hyperparathyroidism of renal origin: Secondary | ICD-10-CM | POA: Diagnosis not present

## 2016-11-15 DIAGNOSIS — Z992 Dependence on renal dialysis: Secondary | ICD-10-CM | POA: Diagnosis not present

## 2016-11-15 DIAGNOSIS — D631 Anemia in chronic kidney disease: Secondary | ICD-10-CM | POA: Diagnosis not present

## 2016-11-15 DIAGNOSIS — D509 Iron deficiency anemia, unspecified: Secondary | ICD-10-CM | POA: Diagnosis not present

## 2016-11-17 ENCOUNTER — Ambulatory Visit (HOSPITAL_COMMUNITY)
Admission: RE | Admit: 2016-11-17 | Discharge: 2016-11-17 | Disposition: A | Discharge status: Home / Self Care | Payer: Medicare Other | Source: Ambulatory Visit | Attending: Internal Medicine | Admitting: Internal Medicine

## 2016-11-17 DIAGNOSIS — J392 Other diseases of pharynx: Secondary | ICD-10-CM | POA: Insufficient documentation

## 2016-11-17 DIAGNOSIS — G9389 Other specified disorders of brain: Secondary | ICD-10-CM | POA: Diagnosis not present

## 2016-11-17 DIAGNOSIS — M4802 Spinal stenosis, cervical region: Secondary | ICD-10-CM | POA: Insufficient documentation

## 2016-11-17 DIAGNOSIS — G9520 Unspecified cord compression: Secondary | ICD-10-CM | POA: Diagnosis not present

## 2016-11-17 DIAGNOSIS — M2578 Osteophyte, vertebrae: Secondary | ICD-10-CM | POA: Insufficient documentation

## 2016-11-17 DIAGNOSIS — R937 Abnormal findings on diagnostic imaging of other parts of musculoskeletal system: Secondary | ICD-10-CM

## 2016-11-17 DIAGNOSIS — M5021 Other cervical disc displacement,  high cervical region: Secondary | ICD-10-CM | POA: Diagnosis not present

## 2016-11-17 DIAGNOSIS — M542 Cervicalgia: Secondary | ICD-10-CM | POA: Insufficient documentation

## 2016-11-18 DIAGNOSIS — Z992 Dependence on renal dialysis: Secondary | ICD-10-CM | POA: Diagnosis not present

## 2016-11-18 DIAGNOSIS — D509 Iron deficiency anemia, unspecified: Secondary | ICD-10-CM | POA: Diagnosis not present

## 2016-11-18 DIAGNOSIS — E162 Hypoglycemia, unspecified: Secondary | ICD-10-CM | POA: Diagnosis not present

## 2016-11-18 DIAGNOSIS — N2581 Secondary hyperparathyroidism of renal origin: Secondary | ICD-10-CM | POA: Diagnosis not present

## 2016-11-18 DIAGNOSIS — E11649 Type 2 diabetes mellitus with hypoglycemia without coma: Secondary | ICD-10-CM | POA: Diagnosis not present

## 2016-11-18 DIAGNOSIS — N186 End stage renal disease: Secondary | ICD-10-CM | POA: Diagnosis not present

## 2016-11-20 DIAGNOSIS — D509 Iron deficiency anemia, unspecified: Secondary | ICD-10-CM | POA: Diagnosis not present

## 2016-11-20 DIAGNOSIS — N2581 Secondary hyperparathyroidism of renal origin: Secondary | ICD-10-CM | POA: Diagnosis not present

## 2016-11-20 DIAGNOSIS — E11649 Type 2 diabetes mellitus with hypoglycemia without coma: Secondary | ICD-10-CM | POA: Diagnosis not present

## 2016-11-20 DIAGNOSIS — Z992 Dependence on renal dialysis: Secondary | ICD-10-CM | POA: Diagnosis not present

## 2016-11-20 DIAGNOSIS — N186 End stage renal disease: Secondary | ICD-10-CM | POA: Diagnosis not present

## 2016-11-20 DIAGNOSIS — E162 Hypoglycemia, unspecified: Secondary | ICD-10-CM | POA: Diagnosis not present

## 2016-11-22 DIAGNOSIS — E11649 Type 2 diabetes mellitus with hypoglycemia without coma: Secondary | ICD-10-CM | POA: Diagnosis not present

## 2016-11-22 DIAGNOSIS — E162 Hypoglycemia, unspecified: Secondary | ICD-10-CM | POA: Diagnosis not present

## 2016-11-22 DIAGNOSIS — D509 Iron deficiency anemia, unspecified: Secondary | ICD-10-CM | POA: Diagnosis not present

## 2016-11-22 DIAGNOSIS — N186 End stage renal disease: Secondary | ICD-10-CM | POA: Diagnosis not present

## 2016-11-22 DIAGNOSIS — Z992 Dependence on renal dialysis: Secondary | ICD-10-CM | POA: Diagnosis not present

## 2016-11-22 DIAGNOSIS — N2581 Secondary hyperparathyroidism of renal origin: Secondary | ICD-10-CM | POA: Diagnosis not present

## 2016-11-24 DIAGNOSIS — N186 End stage renal disease: Secondary | ICD-10-CM | POA: Diagnosis not present

## 2016-11-24 DIAGNOSIS — M4802 Spinal stenosis, cervical region: Secondary | ICD-10-CM | POA: Diagnosis not present

## 2016-11-24 DIAGNOSIS — M5412 Radiculopathy, cervical region: Secondary | ICD-10-CM | POA: Diagnosis not present

## 2016-11-24 DIAGNOSIS — R030 Elevated blood-pressure reading, without diagnosis of hypertension: Secondary | ICD-10-CM | POA: Diagnosis not present

## 2016-11-24 DIAGNOSIS — Z992 Dependence on renal dialysis: Secondary | ICD-10-CM | POA: Diagnosis not present

## 2016-11-24 DIAGNOSIS — M542 Cervicalgia: Secondary | ICD-10-CM | POA: Diagnosis not present

## 2016-11-25 DIAGNOSIS — N2581 Secondary hyperparathyroidism of renal origin: Secondary | ICD-10-CM | POA: Diagnosis not present

## 2016-11-25 DIAGNOSIS — E11649 Type 2 diabetes mellitus with hypoglycemia without coma: Secondary | ICD-10-CM | POA: Diagnosis not present

## 2016-11-25 DIAGNOSIS — N186 End stage renal disease: Secondary | ICD-10-CM | POA: Diagnosis not present

## 2016-11-25 DIAGNOSIS — E162 Hypoglycemia, unspecified: Secondary | ICD-10-CM | POA: Diagnosis not present

## 2016-11-25 DIAGNOSIS — Z992 Dependence on renal dialysis: Secondary | ICD-10-CM | POA: Diagnosis not present

## 2016-11-25 DIAGNOSIS — D509 Iron deficiency anemia, unspecified: Secondary | ICD-10-CM | POA: Diagnosis not present

## 2016-11-25 DIAGNOSIS — E119 Type 2 diabetes mellitus without complications: Secondary | ICD-10-CM | POA: Diagnosis not present

## 2016-11-27 DIAGNOSIS — N2581 Secondary hyperparathyroidism of renal origin: Secondary | ICD-10-CM | POA: Diagnosis not present

## 2016-11-27 DIAGNOSIS — D509 Iron deficiency anemia, unspecified: Secondary | ICD-10-CM | POA: Diagnosis not present

## 2016-11-27 DIAGNOSIS — N186 End stage renal disease: Secondary | ICD-10-CM | POA: Diagnosis not present

## 2016-11-27 DIAGNOSIS — E11649 Type 2 diabetes mellitus with hypoglycemia without coma: Secondary | ICD-10-CM | POA: Diagnosis not present

## 2016-11-27 DIAGNOSIS — E162 Hypoglycemia, unspecified: Secondary | ICD-10-CM | POA: Diagnosis not present

## 2016-11-27 DIAGNOSIS — Z992 Dependence on renal dialysis: Secondary | ICD-10-CM | POA: Diagnosis not present

## 2016-11-29 DIAGNOSIS — Z992 Dependence on renal dialysis: Secondary | ICD-10-CM | POA: Diagnosis not present

## 2016-11-29 DIAGNOSIS — N2581 Secondary hyperparathyroidism of renal origin: Secondary | ICD-10-CM | POA: Diagnosis not present

## 2016-11-29 DIAGNOSIS — D509 Iron deficiency anemia, unspecified: Secondary | ICD-10-CM | POA: Diagnosis not present

## 2016-11-29 DIAGNOSIS — E162 Hypoglycemia, unspecified: Secondary | ICD-10-CM | POA: Diagnosis not present

## 2016-11-29 DIAGNOSIS — N186 End stage renal disease: Secondary | ICD-10-CM | POA: Diagnosis not present

## 2016-11-29 DIAGNOSIS — E11649 Type 2 diabetes mellitus with hypoglycemia without coma: Secondary | ICD-10-CM | POA: Diagnosis not present

## 2016-12-02 DIAGNOSIS — D509 Iron deficiency anemia, unspecified: Secondary | ICD-10-CM | POA: Diagnosis not present

## 2016-12-02 DIAGNOSIS — Z992 Dependence on renal dialysis: Secondary | ICD-10-CM | POA: Diagnosis not present

## 2016-12-02 DIAGNOSIS — E11649 Type 2 diabetes mellitus with hypoglycemia without coma: Secondary | ICD-10-CM | POA: Diagnosis not present

## 2016-12-02 DIAGNOSIS — N2581 Secondary hyperparathyroidism of renal origin: Secondary | ICD-10-CM | POA: Diagnosis not present

## 2016-12-02 DIAGNOSIS — E162 Hypoglycemia, unspecified: Secondary | ICD-10-CM | POA: Diagnosis not present

## 2016-12-02 DIAGNOSIS — N186 End stage renal disease: Secondary | ICD-10-CM | POA: Diagnosis not present

## 2016-12-04 DIAGNOSIS — N2581 Secondary hyperparathyroidism of renal origin: Secondary | ICD-10-CM | POA: Diagnosis not present

## 2016-12-04 DIAGNOSIS — Z992 Dependence on renal dialysis: Secondary | ICD-10-CM | POA: Diagnosis not present

## 2016-12-04 DIAGNOSIS — E162 Hypoglycemia, unspecified: Secondary | ICD-10-CM | POA: Diagnosis not present

## 2016-12-04 DIAGNOSIS — E11649 Type 2 diabetes mellitus with hypoglycemia without coma: Secondary | ICD-10-CM | POA: Diagnosis not present

## 2016-12-04 DIAGNOSIS — D509 Iron deficiency anemia, unspecified: Secondary | ICD-10-CM | POA: Diagnosis not present

## 2016-12-04 DIAGNOSIS — N186 End stage renal disease: Secondary | ICD-10-CM | POA: Diagnosis not present

## 2016-12-06 DIAGNOSIS — D509 Iron deficiency anemia, unspecified: Secondary | ICD-10-CM | POA: Diagnosis not present

## 2016-12-06 DIAGNOSIS — E162 Hypoglycemia, unspecified: Secondary | ICD-10-CM | POA: Diagnosis not present

## 2016-12-06 DIAGNOSIS — N186 End stage renal disease: Secondary | ICD-10-CM | POA: Diagnosis not present

## 2016-12-06 DIAGNOSIS — N2581 Secondary hyperparathyroidism of renal origin: Secondary | ICD-10-CM | POA: Diagnosis not present

## 2016-12-06 DIAGNOSIS — E11649 Type 2 diabetes mellitus with hypoglycemia without coma: Secondary | ICD-10-CM | POA: Diagnosis not present

## 2016-12-06 DIAGNOSIS — Z992 Dependence on renal dialysis: Secondary | ICD-10-CM | POA: Diagnosis not present

## 2016-12-09 DIAGNOSIS — E162 Hypoglycemia, unspecified: Secondary | ICD-10-CM | POA: Diagnosis not present

## 2016-12-09 DIAGNOSIS — N2581 Secondary hyperparathyroidism of renal origin: Secondary | ICD-10-CM | POA: Diagnosis not present

## 2016-12-09 DIAGNOSIS — N186 End stage renal disease: Secondary | ICD-10-CM | POA: Diagnosis not present

## 2016-12-09 DIAGNOSIS — E11649 Type 2 diabetes mellitus with hypoglycemia without coma: Secondary | ICD-10-CM | POA: Diagnosis not present

## 2016-12-09 DIAGNOSIS — D509 Iron deficiency anemia, unspecified: Secondary | ICD-10-CM | POA: Diagnosis not present

## 2016-12-09 DIAGNOSIS — Z992 Dependence on renal dialysis: Secondary | ICD-10-CM | POA: Diagnosis not present

## 2016-12-11 DIAGNOSIS — E162 Hypoglycemia, unspecified: Secondary | ICD-10-CM | POA: Diagnosis not present

## 2016-12-11 DIAGNOSIS — Z992 Dependence on renal dialysis: Secondary | ICD-10-CM | POA: Diagnosis not present

## 2016-12-11 DIAGNOSIS — N186 End stage renal disease: Secondary | ICD-10-CM | POA: Diagnosis not present

## 2016-12-11 DIAGNOSIS — D509 Iron deficiency anemia, unspecified: Secondary | ICD-10-CM | POA: Diagnosis not present

## 2016-12-11 DIAGNOSIS — E11649 Type 2 diabetes mellitus with hypoglycemia without coma: Secondary | ICD-10-CM | POA: Diagnosis not present

## 2016-12-11 DIAGNOSIS — N2581 Secondary hyperparathyroidism of renal origin: Secondary | ICD-10-CM | POA: Diagnosis not present

## 2016-12-13 DIAGNOSIS — Z992 Dependence on renal dialysis: Secondary | ICD-10-CM | POA: Diagnosis not present

## 2016-12-13 DIAGNOSIS — N2581 Secondary hyperparathyroidism of renal origin: Secondary | ICD-10-CM | POA: Diagnosis not present

## 2016-12-13 DIAGNOSIS — E11649 Type 2 diabetes mellitus with hypoglycemia without coma: Secondary | ICD-10-CM | POA: Diagnosis not present

## 2016-12-13 DIAGNOSIS — E162 Hypoglycemia, unspecified: Secondary | ICD-10-CM | POA: Diagnosis not present

## 2016-12-13 DIAGNOSIS — D509 Iron deficiency anemia, unspecified: Secondary | ICD-10-CM | POA: Diagnosis not present

## 2016-12-13 DIAGNOSIS — N186 End stage renal disease: Secondary | ICD-10-CM | POA: Diagnosis not present

## 2016-12-15 DIAGNOSIS — N186 End stage renal disease: Secondary | ICD-10-CM | POA: Diagnosis not present

## 2016-12-15 DIAGNOSIS — Z992 Dependence on renal dialysis: Secondary | ICD-10-CM | POA: Diagnosis not present

## 2016-12-16 DIAGNOSIS — D509 Iron deficiency anemia, unspecified: Secondary | ICD-10-CM | POA: Diagnosis not present

## 2016-12-16 DIAGNOSIS — D631 Anemia in chronic kidney disease: Secondary | ICD-10-CM | POA: Diagnosis not present

## 2016-12-16 DIAGNOSIS — Z992 Dependence on renal dialysis: Secondary | ICD-10-CM | POA: Diagnosis not present

## 2016-12-16 DIAGNOSIS — N186 End stage renal disease: Secondary | ICD-10-CM | POA: Diagnosis not present

## 2016-12-16 DIAGNOSIS — N2581 Secondary hyperparathyroidism of renal origin: Secondary | ICD-10-CM | POA: Diagnosis not present

## 2016-12-18 DIAGNOSIS — D509 Iron deficiency anemia, unspecified: Secondary | ICD-10-CM | POA: Diagnosis not present

## 2016-12-18 DIAGNOSIS — D631 Anemia in chronic kidney disease: Secondary | ICD-10-CM | POA: Diagnosis not present

## 2016-12-18 DIAGNOSIS — Z992 Dependence on renal dialysis: Secondary | ICD-10-CM | POA: Diagnosis not present

## 2016-12-18 DIAGNOSIS — N186 End stage renal disease: Secondary | ICD-10-CM | POA: Diagnosis not present

## 2016-12-18 DIAGNOSIS — N2581 Secondary hyperparathyroidism of renal origin: Secondary | ICD-10-CM | POA: Diagnosis not present

## 2016-12-19 DIAGNOSIS — I871 Compression of vein: Secondary | ICD-10-CM | POA: Diagnosis not present

## 2016-12-19 DIAGNOSIS — N186 End stage renal disease: Secondary | ICD-10-CM | POA: Diagnosis not present

## 2016-12-19 DIAGNOSIS — Z992 Dependence on renal dialysis: Secondary | ICD-10-CM | POA: Diagnosis not present

## 2016-12-20 DIAGNOSIS — D509 Iron deficiency anemia, unspecified: Secondary | ICD-10-CM | POA: Diagnosis not present

## 2016-12-20 DIAGNOSIS — D631 Anemia in chronic kidney disease: Secondary | ICD-10-CM | POA: Diagnosis not present

## 2016-12-20 DIAGNOSIS — N186 End stage renal disease: Secondary | ICD-10-CM | POA: Diagnosis not present

## 2016-12-20 DIAGNOSIS — N2581 Secondary hyperparathyroidism of renal origin: Secondary | ICD-10-CM | POA: Diagnosis not present

## 2016-12-20 DIAGNOSIS — Z992 Dependence on renal dialysis: Secondary | ICD-10-CM | POA: Diagnosis not present

## 2016-12-23 DIAGNOSIS — D631 Anemia in chronic kidney disease: Secondary | ICD-10-CM | POA: Diagnosis not present

## 2016-12-23 DIAGNOSIS — N186 End stage renal disease: Secondary | ICD-10-CM | POA: Diagnosis not present

## 2016-12-23 DIAGNOSIS — N2581 Secondary hyperparathyroidism of renal origin: Secondary | ICD-10-CM | POA: Diagnosis not present

## 2016-12-23 DIAGNOSIS — Z992 Dependence on renal dialysis: Secondary | ICD-10-CM | POA: Diagnosis not present

## 2016-12-23 DIAGNOSIS — D509 Iron deficiency anemia, unspecified: Secondary | ICD-10-CM | POA: Diagnosis not present

## 2016-12-25 DIAGNOSIS — Z992 Dependence on renal dialysis: Secondary | ICD-10-CM | POA: Diagnosis not present

## 2016-12-25 DIAGNOSIS — D631 Anemia in chronic kidney disease: Secondary | ICD-10-CM | POA: Diagnosis not present

## 2016-12-25 DIAGNOSIS — N186 End stage renal disease: Secondary | ICD-10-CM | POA: Diagnosis not present

## 2016-12-25 DIAGNOSIS — D509 Iron deficiency anemia, unspecified: Secondary | ICD-10-CM | POA: Diagnosis not present

## 2016-12-25 DIAGNOSIS — N2581 Secondary hyperparathyroidism of renal origin: Secondary | ICD-10-CM | POA: Diagnosis not present

## 2016-12-29 DIAGNOSIS — N186 End stage renal disease: Secondary | ICD-10-CM | POA: Diagnosis not present

## 2016-12-29 DIAGNOSIS — N2581 Secondary hyperparathyroidism of renal origin: Secondary | ICD-10-CM | POA: Diagnosis not present

## 2016-12-29 DIAGNOSIS — Z992 Dependence on renal dialysis: Secondary | ICD-10-CM | POA: Diagnosis not present

## 2016-12-29 DIAGNOSIS — D631 Anemia in chronic kidney disease: Secondary | ICD-10-CM | POA: Diagnosis not present

## 2016-12-29 DIAGNOSIS — D509 Iron deficiency anemia, unspecified: Secondary | ICD-10-CM | POA: Diagnosis not present

## 2016-12-30 DIAGNOSIS — N2581 Secondary hyperparathyroidism of renal origin: Secondary | ICD-10-CM | POA: Diagnosis not present

## 2016-12-30 DIAGNOSIS — Z992 Dependence on renal dialysis: Secondary | ICD-10-CM | POA: Diagnosis not present

## 2016-12-30 DIAGNOSIS — N186 End stage renal disease: Secondary | ICD-10-CM | POA: Diagnosis not present

## 2016-12-30 DIAGNOSIS — D509 Iron deficiency anemia, unspecified: Secondary | ICD-10-CM | POA: Diagnosis not present

## 2016-12-30 DIAGNOSIS — D631 Anemia in chronic kidney disease: Secondary | ICD-10-CM | POA: Diagnosis not present

## 2017-01-01 DIAGNOSIS — D509 Iron deficiency anemia, unspecified: Secondary | ICD-10-CM | POA: Diagnosis not present

## 2017-01-01 DIAGNOSIS — Z992 Dependence on renal dialysis: Secondary | ICD-10-CM | POA: Diagnosis not present

## 2017-01-01 DIAGNOSIS — D631 Anemia in chronic kidney disease: Secondary | ICD-10-CM | POA: Diagnosis not present

## 2017-01-01 DIAGNOSIS — N186 End stage renal disease: Secondary | ICD-10-CM | POA: Diagnosis not present

## 2017-01-01 DIAGNOSIS — N2581 Secondary hyperparathyroidism of renal origin: Secondary | ICD-10-CM | POA: Diagnosis not present

## 2017-01-03 DIAGNOSIS — D509 Iron deficiency anemia, unspecified: Secondary | ICD-10-CM | POA: Diagnosis not present

## 2017-01-03 DIAGNOSIS — D631 Anemia in chronic kidney disease: Secondary | ICD-10-CM | POA: Diagnosis not present

## 2017-01-03 DIAGNOSIS — N186 End stage renal disease: Secondary | ICD-10-CM | POA: Diagnosis not present

## 2017-01-03 DIAGNOSIS — Z992 Dependence on renal dialysis: Secondary | ICD-10-CM | POA: Diagnosis not present

## 2017-01-03 DIAGNOSIS — N2581 Secondary hyperparathyroidism of renal origin: Secondary | ICD-10-CM | POA: Diagnosis not present

## 2017-01-06 DIAGNOSIS — N186 End stage renal disease: Secondary | ICD-10-CM | POA: Diagnosis not present

## 2017-01-06 DIAGNOSIS — D631 Anemia in chronic kidney disease: Secondary | ICD-10-CM | POA: Diagnosis not present

## 2017-01-06 DIAGNOSIS — Z992 Dependence on renal dialysis: Secondary | ICD-10-CM | POA: Diagnosis not present

## 2017-01-06 DIAGNOSIS — D509 Iron deficiency anemia, unspecified: Secondary | ICD-10-CM | POA: Diagnosis not present

## 2017-01-06 DIAGNOSIS — N2581 Secondary hyperparathyroidism of renal origin: Secondary | ICD-10-CM | POA: Diagnosis not present

## 2017-01-08 DIAGNOSIS — N2581 Secondary hyperparathyroidism of renal origin: Secondary | ICD-10-CM | POA: Diagnosis not present

## 2017-01-08 DIAGNOSIS — N186 End stage renal disease: Secondary | ICD-10-CM | POA: Diagnosis not present

## 2017-01-08 DIAGNOSIS — D509 Iron deficiency anemia, unspecified: Secondary | ICD-10-CM | POA: Diagnosis not present

## 2017-01-08 DIAGNOSIS — D631 Anemia in chronic kidney disease: Secondary | ICD-10-CM | POA: Diagnosis not present

## 2017-01-08 DIAGNOSIS — Z992 Dependence on renal dialysis: Secondary | ICD-10-CM | POA: Diagnosis not present

## 2017-01-10 DIAGNOSIS — D509 Iron deficiency anemia, unspecified: Secondary | ICD-10-CM | POA: Diagnosis not present

## 2017-01-10 DIAGNOSIS — N2581 Secondary hyperparathyroidism of renal origin: Secondary | ICD-10-CM | POA: Diagnosis not present

## 2017-01-10 DIAGNOSIS — N186 End stage renal disease: Secondary | ICD-10-CM | POA: Diagnosis not present

## 2017-01-10 DIAGNOSIS — D631 Anemia in chronic kidney disease: Secondary | ICD-10-CM | POA: Diagnosis not present

## 2017-01-10 DIAGNOSIS — Z992 Dependence on renal dialysis: Secondary | ICD-10-CM | POA: Diagnosis not present

## 2017-01-13 DIAGNOSIS — Z992 Dependence on renal dialysis: Secondary | ICD-10-CM | POA: Diagnosis not present

## 2017-01-13 DIAGNOSIS — D509 Iron deficiency anemia, unspecified: Secondary | ICD-10-CM | POA: Diagnosis not present

## 2017-01-13 DIAGNOSIS — D631 Anemia in chronic kidney disease: Secondary | ICD-10-CM | POA: Diagnosis not present

## 2017-01-13 DIAGNOSIS — N186 End stage renal disease: Secondary | ICD-10-CM | POA: Diagnosis not present

## 2017-01-13 DIAGNOSIS — N2581 Secondary hyperparathyroidism of renal origin: Secondary | ICD-10-CM | POA: Diagnosis not present

## 2017-01-15 DIAGNOSIS — Z992 Dependence on renal dialysis: Secondary | ICD-10-CM | POA: Diagnosis not present

## 2017-01-15 DIAGNOSIS — D509 Iron deficiency anemia, unspecified: Secondary | ICD-10-CM | POA: Diagnosis not present

## 2017-01-15 DIAGNOSIS — N186 End stage renal disease: Secondary | ICD-10-CM | POA: Diagnosis not present

## 2017-01-15 DIAGNOSIS — N2581 Secondary hyperparathyroidism of renal origin: Secondary | ICD-10-CM | POA: Diagnosis not present

## 2017-01-15 DIAGNOSIS — D631 Anemia in chronic kidney disease: Secondary | ICD-10-CM | POA: Diagnosis not present

## 2017-01-17 DIAGNOSIS — N186 End stage renal disease: Secondary | ICD-10-CM | POA: Diagnosis not present

## 2017-01-17 DIAGNOSIS — N2581 Secondary hyperparathyroidism of renal origin: Secondary | ICD-10-CM | POA: Diagnosis not present

## 2017-01-17 DIAGNOSIS — Z992 Dependence on renal dialysis: Secondary | ICD-10-CM | POA: Diagnosis not present

## 2017-01-17 DIAGNOSIS — D631 Anemia in chronic kidney disease: Secondary | ICD-10-CM | POA: Diagnosis not present

## 2017-01-19 DIAGNOSIS — R634 Abnormal weight loss: Secondary | ICD-10-CM | POA: Diagnosis not present

## 2017-01-19 DIAGNOSIS — E1165 Type 2 diabetes mellitus with hyperglycemia: Secondary | ICD-10-CM | POA: Diagnosis not present

## 2017-01-19 DIAGNOSIS — N186 End stage renal disease: Secondary | ICD-10-CM | POA: Diagnosis not present

## 2017-01-19 DIAGNOSIS — D631 Anemia in chronic kidney disease: Secondary | ICD-10-CM | POA: Diagnosis not present

## 2017-01-20 DIAGNOSIS — N186 End stage renal disease: Secondary | ICD-10-CM | POA: Diagnosis not present

## 2017-01-20 DIAGNOSIS — D631 Anemia in chronic kidney disease: Secondary | ICD-10-CM | POA: Diagnosis not present

## 2017-01-20 DIAGNOSIS — N2581 Secondary hyperparathyroidism of renal origin: Secondary | ICD-10-CM | POA: Diagnosis not present

## 2017-01-20 DIAGNOSIS — Z992 Dependence on renal dialysis: Secondary | ICD-10-CM | POA: Diagnosis not present

## 2017-01-22 DIAGNOSIS — N186 End stage renal disease: Secondary | ICD-10-CM | POA: Diagnosis not present

## 2017-01-22 DIAGNOSIS — N2581 Secondary hyperparathyroidism of renal origin: Secondary | ICD-10-CM | POA: Diagnosis not present

## 2017-01-22 DIAGNOSIS — Z992 Dependence on renal dialysis: Secondary | ICD-10-CM | POA: Diagnosis not present

## 2017-01-22 DIAGNOSIS — D631 Anemia in chronic kidney disease: Secondary | ICD-10-CM | POA: Diagnosis not present

## 2017-01-24 ENCOUNTER — Inpatient Hospital Stay (HOSPITAL_COMMUNITY)
Admission: EM | Admit: 2017-01-24 | Discharge: 2017-01-30 | DRG: 308 | Disposition: A | Discharge status: Home / Self Care | Payer: Medicare Other | Attending: Internal Medicine | Admitting: Internal Medicine

## 2017-01-24 ENCOUNTER — Emergency Department (HOSPITAL_COMMUNITY): Payer: Medicare Other

## 2017-01-24 ENCOUNTER — Encounter (HOSPITAL_COMMUNITY): Payer: Self-pay | Admitting: *Deleted

## 2017-01-24 ENCOUNTER — Observation Stay (HOSPITAL_BASED_OUTPATIENT_CLINIC_OR_DEPARTMENT_OTHER): Payer: Medicare Other

## 2017-01-24 DIAGNOSIS — Z992 Dependence on renal dialysis: Secondary | ICD-10-CM | POA: Diagnosis not present

## 2017-01-24 DIAGNOSIS — I700 Atherosclerosis of aorta: Secondary | ICD-10-CM | POA: Diagnosis not present

## 2017-01-24 DIAGNOSIS — Z7189 Other specified counseling: Secondary | ICD-10-CM

## 2017-01-24 DIAGNOSIS — I499 Cardiac arrhythmia, unspecified: Secondary | ICD-10-CM | POA: Diagnosis not present

## 2017-01-24 DIAGNOSIS — I10 Essential (primary) hypertension: Secondary | ICD-10-CM | POA: Diagnosis not present

## 2017-01-24 DIAGNOSIS — R7989 Other specified abnormal findings of blood chemistry: Secondary | ICD-10-CM

## 2017-01-24 DIAGNOSIS — I248 Other forms of acute ischemic heart disease: Secondary | ICD-10-CM | POA: Diagnosis present

## 2017-01-24 DIAGNOSIS — R000 Tachycardia, unspecified: Secondary | ICD-10-CM | POA: Diagnosis not present

## 2017-01-24 DIAGNOSIS — R748 Abnormal levels of other serum enzymes: Secondary | ICD-10-CM | POA: Diagnosis not present

## 2017-01-24 DIAGNOSIS — I120 Hypertensive chronic kidney disease with stage 5 chronic kidney disease or end stage renal disease: Secondary | ICD-10-CM | POA: Diagnosis not present

## 2017-01-24 DIAGNOSIS — Z79899 Other long term (current) drug therapy: Secondary | ICD-10-CM

## 2017-01-24 DIAGNOSIS — N2581 Secondary hyperparathyroidism of renal origin: Secondary | ICD-10-CM | POA: Diagnosis not present

## 2017-01-24 DIAGNOSIS — E7800 Pure hypercholesterolemia, unspecified: Secondary | ICD-10-CM | POA: Diagnosis present

## 2017-01-24 DIAGNOSIS — R112 Nausea with vomiting, unspecified: Secondary | ICD-10-CM

## 2017-01-24 DIAGNOSIS — E119 Type 2 diabetes mellitus without complications: Secondary | ICD-10-CM

## 2017-01-24 DIAGNOSIS — Z87891 Personal history of nicotine dependence: Secondary | ICD-10-CM

## 2017-01-24 DIAGNOSIS — Z7982 Long term (current) use of aspirin: Secondary | ICD-10-CM

## 2017-01-24 DIAGNOSIS — Z7901 Long term (current) use of anticoagulants: Secondary | ICD-10-CM

## 2017-01-24 DIAGNOSIS — M109 Gout, unspecified: Secondary | ICD-10-CM | POA: Diagnosis present

## 2017-01-24 DIAGNOSIS — E785 Hyperlipidemia, unspecified: Secondary | ICD-10-CM | POA: Diagnosis present

## 2017-01-24 DIAGNOSIS — D649 Anemia, unspecified: Secondary | ICD-10-CM

## 2017-01-24 DIAGNOSIS — E1122 Type 2 diabetes mellitus with diabetic chronic kidney disease: Secondary | ICD-10-CM | POA: Diagnosis present

## 2017-01-24 DIAGNOSIS — I4891 Unspecified atrial fibrillation: Secondary | ICD-10-CM | POA: Diagnosis not present

## 2017-01-24 DIAGNOSIS — R778 Other specified abnormalities of plasma proteins: Secondary | ICD-10-CM

## 2017-01-24 DIAGNOSIS — N186 End stage renal disease: Secondary | ICD-10-CM | POA: Diagnosis not present

## 2017-01-24 DIAGNOSIS — I360 Nonrheumatic tricuspid (valve) stenosis: Secondary | ICD-10-CM

## 2017-01-24 DIAGNOSIS — Z85038 Personal history of other malignant neoplasm of large intestine: Secondary | ICD-10-CM

## 2017-01-24 DIAGNOSIS — Z8673 Personal history of transient ischemic attack (TIA), and cerebral infarction without residual deficits: Secondary | ICD-10-CM

## 2017-01-24 DIAGNOSIS — D631 Anemia in chronic kidney disease: Secondary | ICD-10-CM | POA: Diagnosis not present

## 2017-01-24 HISTORY — DX: End stage renal disease: N18.6

## 2017-01-24 LAB — ECHOCARDIOGRAM COMPLETE
CHL CUP DOP CALC LVOT VTI: 20.2 cm
CHL CUP TV REG PEAK VELOCITY: 260 cm/s
E decel time: 190 msec
FS: 35 % (ref 28–44)
Height: 64 in
IVS/LV PW RATIO, ED: 1.18
LA ID, A-P, ES: 36 mm
LA vol: 89.2 mL
LADIAMINDEX: 2.34 cm/m2
LAVOLA4C: 80.1 mL
LAVOLIN: 57.9 mL/m2
LDCA: 2.84 cm2
LEFT ATRIUM END SYS DIAM: 36 mm
LV PW d: 11 mm — AB (ref 0.6–1.1)
LV SIMPSON'S DISK: 69
LV dias vol index: 32 mL/m2
LV dias vol: 49 mL (ref 46–106)
LV sys vol index: 10 mL/m2
LVOT diameter: 19 mm
LVOT peak grad rest: 4 mmHg
LVOT peak vel: 98.3 cm/s
LVOTSV: 57 mL
LVSYSVOL: 15 mL (ref 14–42)
MV Dec: 190
MV pk E vel: 144 m/s
MVPG: 8 mmHg
RV sys press: 35 mmHg
Stroke v: 34 ml
TAPSE: 20.3 mm
TR max vel: 260 cm/s
Weight: 1862.45 oz

## 2017-01-24 LAB — BASIC METABOLIC PANEL
Anion gap: 16 — ABNORMAL HIGH (ref 5–15)
BUN: 23 mg/dL — AB (ref 6–20)
CO2: 28 mmol/L (ref 22–32)
CREATININE: 5.84 mg/dL — AB (ref 0.44–1.00)
Calcium: 10.1 mg/dL (ref 8.9–10.3)
Chloride: 93 mmol/L — ABNORMAL LOW (ref 101–111)
GFR calc Af Amer: 7 mL/min — ABNORMAL LOW (ref >60)
GFR, EST NON AFRICAN AMERICAN: 6 mL/min — AB (ref >60)
GLUCOSE: 137 mg/dL — AB (ref 65–99)
POTASSIUM: 3.5 mmol/L (ref 3.5–5.1)
Sodium: 137 mmol/L (ref 135–145)

## 2017-01-24 LAB — GLUCOSE, CAPILLARY
GLUCOSE-CAPILLARY: 135 mg/dL — AB (ref 65–99)
GLUCOSE-CAPILLARY: 131 mg/dL — AB (ref 65–99)

## 2017-01-24 LAB — TROPONIN I
TROPONIN I: 0.09 ng/mL — AB (ref <0.03)
Troponin I: 0.08 ng/mL (ref <0.03)

## 2017-01-24 LAB — CBC
HEMATOCRIT: 36 % (ref 36.0–46.0)
HEMOGLOBIN: 11.5 g/dL — AB (ref 12.0–15.0)
MCH: 27.9 pg (ref 26.0–34.0)
MCHC: 31.9 g/dL (ref 30.0–36.0)
MCV: 87.4 fL (ref 78.0–100.0)
Platelets: 241 10*3/uL (ref 150–400)
RBC: 4.12 MIL/uL (ref 3.87–5.11)
RDW: 14.3 % (ref 11.5–15.5)
WBC: 4.1 10*3/uL (ref 4.0–10.5)

## 2017-01-24 LAB — PROTIME-INR
INR: 0.98
Prothrombin Time: 13 seconds (ref 11.4–15.2)

## 2017-01-24 LAB — MAGNESIUM: Magnesium: 1.9 mg/dL (ref 1.7–2.4)

## 2017-01-24 LAB — MRSA PCR SCREENING: MRSA BY PCR: NEGATIVE

## 2017-01-24 LAB — HEPARIN LEVEL (UNFRACTIONATED): HEPARIN UNFRACTIONATED: 0.48 [IU]/mL (ref 0.30–0.70)

## 2017-01-24 MED ORDER — ASPIRIN EC 81 MG PO TBEC
81.0000 mg | DELAYED_RELEASE_TABLET | Freq: Every day | ORAL | Status: DC
  Administered 2017-01-24 – 2017-01-30 (×7): 81 mg via ORAL
  Filled 2017-01-24 (×7): qty 1

## 2017-01-24 MED ORDER — DILTIAZEM LOAD VIA INFUSION
20.0000 mg | Freq: Once | INTRAVENOUS | Status: AC
Start: 2017-01-24 — End: 2017-01-24
  Administered 2017-01-24: 20 mg via INTRAVENOUS
  Filled 2017-01-24: qty 20

## 2017-01-24 MED ORDER — SODIUM CHLORIDE 0.9% FLUSH
3.0000 mL | Freq: Two times a day (BID) | INTRAVENOUS | Status: DC
  Administered 2017-01-24: 3 mL via INTRAVENOUS
  Administered 2017-01-24: 10 mL via INTRAVENOUS
  Administered 2017-01-25 – 2017-01-29 (×8): 3 mL via INTRAVENOUS

## 2017-01-24 MED ORDER — BACLOFEN 10 MG PO TABS
10.0000 mg | ORAL_TABLET | Freq: Every day | ORAL | Status: DC
Start: 2017-01-24 — End: 2017-01-30
  Administered 2017-01-24 – 2017-01-30 (×6): 10 mg via ORAL
  Filled 2017-01-24 (×9): qty 1

## 2017-01-24 MED ORDER — TRAMADOL HCL 50 MG PO TABS
50.0000 mg | ORAL_TABLET | Freq: Two times a day (BID) | ORAL | Status: DC
  Administered 2017-01-24 – 2017-01-27 (×5): 50 mg via ORAL
  Filled 2017-01-24 (×5): qty 1

## 2017-01-24 MED ORDER — HEPARIN (PORCINE) IN NACL 100-0.45 UNIT/ML-% IJ SOLN
400.0000 [IU]/h | INTRAMUSCULAR | Status: DC
  Administered 2017-01-24 – 2017-01-25 (×2): 750 [IU]/h via INTRAVENOUS
  Filled 2017-01-24 (×3): qty 250

## 2017-01-24 MED ORDER — WARFARIN - PHARMACIST DOSING INPATIENT
Status: DC
  Administered 2017-01-24 – 2017-01-29 (×4)

## 2017-01-24 MED ORDER — INSULIN ASPART 100 UNIT/ML ~~LOC~~ SOLN
0.0000 [IU] | Freq: Three times a day (TID) | SUBCUTANEOUS | Status: DC
  Administered 2017-01-24 – 2017-01-26 (×4): 1 [IU] via SUBCUTANEOUS
  Administered 2017-01-27: 2 [IU] via SUBCUTANEOUS
  Administered 2017-01-27 – 2017-01-30 (×3): 1 [IU] via SUBCUTANEOUS

## 2017-01-24 MED ORDER — DILTIAZEM HCL 100 MG IV SOLR
5.0000 mg/h | INTRAVENOUS | Status: DC
  Administered 2017-01-24 – 2017-01-26 (×5): 5 mg/h via INTRAVENOUS
  Filled 2017-01-24 (×4): qty 100

## 2017-01-24 MED ORDER — DILTIAZEM HCL 30 MG PO TABS
30.0000 mg | ORAL_TABLET | Freq: Once | ORAL | Status: AC
  Administered 2017-01-24: 30 mg via ORAL
  Filled 2017-01-24: qty 1

## 2017-01-24 MED ORDER — ACETAMINOPHEN 650 MG RE SUPP: 650.0000 mg | Freq: Four times a day (QID) | RECTAL | Status: DC | PRN

## 2017-01-24 MED ORDER — HEPARIN BOLUS VIA INFUSION
3000.0000 [IU] | Freq: Once | INTRAVENOUS | Status: AC
  Administered 2017-01-24: 3000 [IU] via INTRAVENOUS
  Filled 2017-01-24: qty 3000

## 2017-01-24 MED ORDER — OFF THE BEAT BOOK
Freq: Once | Status: DC
  Filled 2017-01-24: qty 1

## 2017-01-24 MED ORDER — ACETAMINOPHEN 325 MG PO TABS: 650.0000 mg | ORAL_TABLET | Freq: Four times a day (QID) | ORAL | Status: DC | PRN

## 2017-01-24 MED ORDER — WARFARIN SODIUM 5 MG PO TABS
5.0000 mg | ORAL_TABLET | Freq: Once | ORAL | Status: AC
  Administered 2017-01-24: 5 mg via ORAL
  Filled 2017-01-24: qty 1

## 2017-01-24 MED ORDER — CALCITRIOL 0.25 MCG PO CAPS
0.2500 ug | ORAL_CAPSULE | Freq: Every day | ORAL | Status: DC
  Administered 2017-01-24 – 2017-01-30 (×7): 0.25 ug via ORAL
  Filled 2017-01-24 (×7): qty 1

## 2017-01-24 NOTE — Progress Notes (Addendum)
ANTICOAGULATION CONSULT NOTE - Initial Consult  Pharmacy Consult for Heparin and Coumadin Indication: atrial fibrillation  No Known Allergies  Patient Measurements: Height: 5\' 4"  (162.6 cm) Weight: 116 lb 6.5 oz (52.8 kg) IBW/kg (Calculated) : 54.7 Heparin Dosing Weight: 52.8 kg  Vital Signs: Temp: 97.5 F (36.4 C) (06/09 1311) Temp Source: Oral (06/09 1311) BP: 128/59 (06/09 1230) Pulse Rate: 122 (06/09 0743)  Labs:  Recent Labs  01/24/17 0755 01/24/17 1109  HGB 11.5*  --   HCT 36.0  --   PLT 241  --   LABPROT 13.0  --   INR 0.98  --   CREATININE 5.84*  --   TROPONINI 0.09* 0.08*    Estimated Creatinine Clearance: 6.4 mL/min (A) (by C-G formula based on SCr of 5.84 mg/dL (H)).   Medical History: Past Medical History:  Diagnosis Date  . 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 Colon cancer ([place])    cecum cancer  . Diabetes mellitus   . ESRD (end stage renal disease) ([place])   . Gout   . Hypercholesteremia   . Hypertension   . Iron deficiency anemia   . Leukopenia   . Stroke [facility]) 2005    Medications:  Prescriptions Prior to Admission  Medication Sig Dispense Refill Last Dose  . aspirin EC 81 MG tablet Take 81 mg by mouth daily.   01/24/2017 at Unknown time  . baclofen (LIORESAL) 10 MG tablet Take 10 mg by mouth daily.   Past Week at Unknown time  . Multiple Vitamin (MULTIVITAMIN) tablet Take 1 tablet by mouth daily.   01/23/2017 at Unknown time  . traMADol (ULTRAM) 50 MG tablet Take 50 mg by mouth 2 (two) times daily.   Past Week at Unknown time  . calcitRIOL (ROCALTROL) 0.25 MCG capsule Take 0.25 mcg by mouth daily. Reported on 02/21/2016   Not Taking at Unknown time    Assessment: 83 yo female ESRD on dialysis.  New onset atrial fibrillation.  Labs reviewed PTA medications reviewed  Goal of Therapy:  Heparin level 0.3-0.7 units/ml Monitor platelets by anticoagulation protocol: Yes   Plan:  Give 3000 units bolus x 1 Start heparin infusion at 750  units/hr Check anti-Xa level in 6-8 hours and daily while on heparin Continue to monitor H&H and platelets  Coumadin 5 mg po x 1 dose today. INR/PT daily  Abner Greenspan, Kyliegh Jester [place] 01/24/2017,1:19 PM

## 2017-01-24 NOTE — ED Provider Notes (Signed)
[facility] DEPT Provider Note   CSN: [id number] Arrival date & time: 01/24/17  0737     History   Chief Complaint Chief Complaint  Patient presents with  . Atrial Fibrillation    HPI Shanitha MANESSA BULEY is a 83 y.o. female.  HPI  The patient is an 83 year old female, she has a history of end-stage renal disease which was thought to be secondary to diabetes according to the patient, she also has a history of stroke, gout, history of colon cancer. She has been on dialysis, her access is in the left upper extremity. While she was at dialysis this morning she was noted to be significantly tachycardic to 140 bpm, there was some irregularity and she was sent to the emergency department for presumed atrial fibrillation which has not been diagnosed within the past. She is not anticoagulated other than a baby aspirin. The patient denies having palpitations, shortness of breath, chest pain or feeling generally weak at all. Her daughter who accompanies her states that she has had some generalized weakness and decline over time but nothing acute. The patient denies any other symptoms at this time.  Past Medical History:  Diagnosis Date  . 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 Chronic kidney disease   . Colon cancer ([place])    cecum cancer  . Diabetes mellitus   . Gout   . Hypercholesteremia   . Hypertension   . Iron deficiency 05/20/2012  . Iron deficiency anemia   . Leukopenia   . Obesity   . Shortness of breath    wheezest time  . Stroke [facility]) 2005    Patient Active Problem List   Diagnosis Date Noted  . Atrial fibrillation with RVR ([place]) 01/24/2017  . ESRD on dialysis ([place]) 06/30/2013  . Anemia in chronic kidney disease(285.21) 05/05/2013  . Iron deficiency 05/20/2012  . HEMORRHOIDS 11/22/2008  . HEMATURIA UNSPECIFIED 11/22/2008  . 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 DM 10/30/2008  . HYPERTENSION 10/30/2008  . TOBACCO USE, QUIT 10/30/2008    Past Surgical History:    Procedure Laterality Date  . ABDOMINAL HYSTERECTOMY    . AV FISTULA PLACEMENT Left 08/24/2013   Procedure: ARTERIOVENOUS (AV) FISTULA CREATION- LEFT BRACHIAL CEPHALIC;  Surgeon: Conrad [place], MD;  Location: [facility];  Service: Vascular;  Laterality: Left;  . CATARACT EXTRACTION, BILATERAL     lens implants  . COLON SURGERY  2010  . EYE SURGERY    . PERIPHERAL VASCULAR CATHETERIZATION N/A 03/03/2016   Procedure: Fistulagram;  Surgeon: Angelia Mould, MD;  Location: [place] CV LAB;  Service: Cardiovascular;  Laterality: N/A;  . PERIPHERAL VASCULAR CATHETERIZATION Left 03/03/2016   Procedure: Peripheral Vascular Balloon Angioplasty;  Surgeon: Angelia Mould, MD;  Location: [place] CV LAB;  Service: Cardiovascular;  Laterality: Left;  arm fistula pta    OB History    No data available       Home Medications    Prior to Admission medications   Medication Sig Start Date End Date Taking? Authorizing Provider  aspirin EC 81 MG tablet Take 81 mg by mouth daily.   Yes [provider]  baclofen (LIORESAL) 10 MG tablet Take 10 mg by mouth daily.   Yes [provider]  Multiple Vitamin (MULTIVITAMIN) tablet Take 1 tablet by mouth daily.   Yes [provider]  traMADol (ULTRAM) 50 MG tablet Take 50 mg by mouth 2 (two) times daily.   Yes [provider]  calcitRIOL (ROCALTROL) 0.25 MCG capsule  Take 0.25 mcg by mouth daily. Reported on 02/21/2016    [provider]    Family History Family History  Problem Relation Age of Onset  . Diabetes Mother   . Hypertension Mother   . Hyperlipidemia Daughter   . Varicose Veins Daughter   . Cancer Son   . Diabetes Son   . Heart disease Son   . Hyperlipidemia Son     Social History Social History  Substance Use Topics  . Smoking status: Former Smoker    Packs/day: 1.00    Years: 10.00    Types: Cigarettes    Quit date: 04/12/1985  . Smokeless tobacco: Never Used  . Alcohol use No      Allergies   Patient has no known allergies.   Review of Systems Review of Systems  All other systems reviewed and are negative.    Physical Exam Updated Vital Signs BP 135/68   Pulse (!) 122   Temp 97.6 F (36.4 C) (Oral)   Resp 18   Ht 5\' 4"  (1.626 m)   Wt 54.4 kg (120 lb)   SpO2 100%   BMI 20.60 kg/m   Physical Exam  Constitutional: She appears well-developed and well-nourished. No distress.  HENT:  Head: Normocephalic and atraumatic.  Mouth/Throat: Oropharynx is clear and moist. No oropharyngeal exudate.  Eyes: Conjunctivae and EOM are normal. Pupils are equal, round, and reactive to light. Right eye exhibits no discharge. Left eye exhibits no discharge. No scleral icterus.  Neck: Normal range of motion. Neck supple. No JVD present. No thyromegaly present.  Cardiovascular: Regular rhythm, normal heart sounds and intact distal pulses.  Exam reveals no gallop and no friction rub.   No murmur heard. Heart rate is approximately 130 bpm, thrill in the left upper extremity fistula is normal, no surrounding redness or induration, pulses at the right radial artery are normal, no JVD, no peripheral edema, no murmurs  Pulmonary/Chest: Effort normal and breath sounds normal. No respiratory distress. She has no wheezes. She has no rales.  Abdominal: Soft. Bowel sounds are normal. She exhibits no distension and no mass. There is no tenderness.  Musculoskeletal: Normal range of motion. She exhibits no edema or tenderness.  Lymphadenopathy:    She has no cervical adenopathy.  Neurological: She is alert. Coordination normal.  Skin: Skin is warm and dry. No rash noted. No erythema.  Psychiatric: She has a normal mood and affect. Her behavior is normal.  Nursing note and vitals reviewed.    ED Treatments / Results  Labs (all labs ordered are listed, but only abnormal results are displayed) Labs Reviewed  BASIC METABOLIC PANEL - Abnormal; Notable for the following:        Result Value   Chloride 93 (*)    Glucose, Bld 137 (*)    BUN 23 (*)    Creatinine, Ser 5.84 (*)    GFR calc non Af Amer 6 (*)    GFR calc Af Amer 7 (*)    Anion gap 16 (*)    All other components within normal limits  CBC - Abnormal; Notable for the following:    Hemoglobin 11.5 (*)    All other components within normal limits  TROPONIN I - Abnormal; Notable for the following:    Troponin I 0.09 (*)    All other components within normal limits  MAGNESIUM  PROTIME-INR  TROPONIN I    EKG  EKG Interpretation  Date/Time:    Ventricular Rate:  139 PR  Interval:    QRS Duration: 87 QT Interval:  309 QTC Calculation: 470 R Axis:   5 Text Interpretation:  Atrial fibrillation Repolarization abnormality, prob rate related Left anterior fasicular block Nonspecific T wave abnormality Since last tracing Atrial fibrillation NOW PRESENT Confirmed by Noemi Chapel [id number]) on 01/24/2017 8:09:27 AM       EKG Interpretation  Date/Time:  Saturday January 24 2017 07:59:02 EDT Ventricular Rate:  130 PR Interval:    QRS Duration: 76 QT Interval:  336 QTC Calculation: 495 R Axis:   5 Text Interpretation:  Atrial flutter ST depression, probably rate related Borderline prolonged QT interval Since last tracing Atrial fibrillation has changed to flutter Confirmed by Noemi Chapel [phone]) on 01/24/2017 8:10:46 AM        Radiology Dg Chest Port 1 View  Result Date: 01/24/2017 CLINICAL DATA:  Cardiac arrhythmia. End-stage renal failure. Hypertension. EXAM: PORTABLE CHEST 1 VIEW COMPARISON:  Dec 19, 2014 FINDINGS: There is no edema or consolidation. Heart is enlarged with pulmonary venous hypertension. No adenopathy. There is aortic atherosclerosis. No bone lesions. IMPRESSION: Pulmonary vascular congestion without edema or consolidation. There is aortic atherosclerosis. Electronically Signed   By: Lowella Grip III M.D.   On: 01/24/2017 08:28    Procedures Procedures (including critical care  time)  Medications Ordered in ED Medications  diltiazem (CARDIZEM) 1 mg/mL load via infusion 20 mg (20 mg Intravenous Bolus from Bag 01/24/17 0826)    And  diltiazem (CARDIZEM) 100 mg in dextrose 5 % 100 mL (1 mg/mL) infusion (0 mg/hr Intravenous Stopped 01/24/17 0913)  diltiazem (CARDIZEM) tablet 30 mg (30 mg Oral Given 01/24/17 0935)     Initial Impression / Assessment and Plan / ED Course  I have reviewed the triage vital signs and the nursing notes.  Pertinent labs & imaging results that were available during my care of the patient were reviewed by me and considered in my medical decision making (see chart for details).     The patient has what appears to be new onset atrial fibrillation or atrial flutter. The 2 EKGs which were obtained show this arrhythmia, she has no other symptoms at this time, we are unsure exactly how long she's been in this rhythm though I would suspect is at least 48 hours and she went to dialysis 2 days ago and there was no comment about vital signs. That being said she has no symptoms, this could be paroxysmal, I will avoid cardioversion at this time as the patient has a higher risk for stroke. That being said we will start with some rate control including Cardizem bolus and drip, chest x-ray, labs, anticipate admission.    This patients CHA2DS2-VASc Score and unadjusted Ischemic Stroke Rate (% per year) is equal to 7.2 % stroke rate/year from a score of 5  Above score calculated as 1 point each if present [CHF, HTN, DM, Vascular=MI/PAD/Aortic Plaque, Age if 65-74, or Female] Above score calculated as 2 points each if present [Age > 75, or Stroke/TIA/TE]  The patient is rate controlled on the Cardizem, we'll switch to oral Cardizem, she is still asymptomatic, her lab testing and imaging has been unremarkable, she should be able to be treated as an outpatient and follow-up for echocardiogram and cardiology. We'll start oral Cardizem, Xarelto, discussed this with the  patient and the family members and they are all in agreement. Intravenous medications discontinued, oral medication started.  I discussed the patient's care with Dr. Lovena Le of cardiology who recommends that if  the patient needs Coumadin she should be admitted to the hospital for IV heparin with a bridge onto Coumadin. The patient does not meet criteria for oral anticoagulants other than Coumadin based on her acute renal failure according to the pharmacist who I spoke with as well as. The patient thus will need to be admitted to the hospital on IV heparin drip for her atrial fibrillation. The patient is being treated for critical illness including atrial fibrillation with rapid ventricular rate on anticoagulant drip  D/w Dr. Sarajane Jews - will place in stepdown tonight -   CRITICAL CARE Performed by: Johnna Acosta Total critical care time: 35 minutes Critical care time was exclusive of separately billable procedures and treating other patients. Critical care was necessary to treat or prevent imminent or life-threatening deterioration. Critical care was time spent personally by me on the following activities: development of treatment plan with patient and/or surrogate as well as nursing, discussions with consultants, evaluation of patient's response to treatment, examination of patient, obtaining history from patient or surrogate, ordering and performing treatments and interventions, ordering and review of laboratory studies, ordering and review of radiographic studies, pulse oximetry and re-evaluation of patient's condition.   Final Clinical Impressions(s) / ED Diagnoses   Final diagnoses:  Atrial fibrillation with rapid ventricular response [facility])    New Prescriptions New Prescriptions   No medications on file     Noemi Chapel, MD 01/24/17 1114

## 2017-01-24 NOTE — H&P (Signed)
History and Physical  Destiny Boyd FXT:[id number] DOB: [birth year] DOA: 01/24/2017  PCP: Rosita Fire, MD  Patient coming from: home  Chief Complaint: fast heart rate  HPI:  83 year old woman PMH end-stage renal disease on hemodialysis Tuesday, Thursday, Saturday; diabetes, colon cancer who was at dialysis today when she was noted to have a fast heart rate and was sent to the emergency department where she was found to have atrial fibrillation with rapid ventricular response and was referred for observation. She did not complete dialysis today.  Patient is felt well lately, no recent illness. No palpitations, no chest pain, and no shortness of breath. Completely unaware that she was in atrial fibrillation. Unknown duration. No specific aggravating or alleviating factors and no associated symptoms. No previous history of heart arrhythmia.  Currently she feels well and has no complaints.  ED Course: Afebrile, tachycardic, treated with diltiazem bolus and infusion.  Review of Systems:  Negative for fever, visual changes, sore throat, rash, chest pain, shortness of breath, dysuria, bleeding, nausea, vomiting, abdominal pain.  Positive for chronic neck pain, diarrhea yesterday which she thinks was secondary to Ensure   Past Medical History:  Diagnosis Date  . 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 Colon cancer ([place])    cecum cancer  . Diabetes mellitus   . ESRD (end stage renal disease) ([place])   . Gout   . Hypercholesteremia   . Hypertension   . Iron deficiency anemia   . Leukopenia   . Stroke [facility]) 2005    Past Surgical History:  Procedure Laterality Date  . ABDOMINAL HYSTERECTOMY    . AV FISTULA PLACEMENT Left 08/24/2013   Procedure: ARTERIOVENOUS (AV) FISTULA CREATION- LEFT BRACHIAL CEPHALIC;  Surgeon: Conrad [place], MD;  Location: [facility];  Service: Vascular;  Laterality: Left;  . CATARACT EXTRACTION, BILATERAL     lens implants  . COLON SURGERY  2010  . EYE SURGERY    .  PERIPHERAL VASCULAR CATHETERIZATION N/A 03/03/2016   Procedure: Fistulagram;  Surgeon: Angelia Mould, MD;  Location: [place] CV LAB;  Service: Cardiovascular;  Laterality: N/A;  . PERIPHERAL VASCULAR CATHETERIZATION Left 03/03/2016   Procedure: Peripheral Vascular Balloon Angioplasty;  Surgeon: Angelia Mould, MD;  Location: [place] CV LAB;  Service: Cardiovascular;  Laterality: Left;  arm fistula pta     reports that she quit smoking about 31 years ago. Her smoking use included Cigarettes. She has a 10.00 pack-year smoking history. She has never used smokeless tobacco. She reports that she does not drink alcohol or use drugs.   No Known Allergies  Family History  Problem Relation Age of Onset  . Diabetes Mother   . Hypertension Mother   . Hyperlipidemia Daughter   . Varicose Veins Daughter   . Cancer Son   . Diabetes Son   . Heart disease Son   . Hyperlipidemia Son      Prior to Admission medications   Medication Sig Start Date End Date Taking? Authorizing Provider  aspirin EC 81 MG tablet Take 81 mg by mouth daily.   Yes [provider]  baclofen (LIORESAL) 10 MG tablet Take 10 mg by mouth daily.   Yes [provider]  Multiple Vitamin (MULTIVITAMIN) tablet Take 1 tablet by mouth daily.   Yes [provider]  traMADol (ULTRAM) 50 MG tablet Take 50 mg by mouth 2 (two) times daily.   Yes [provider]  calcitRIOL (ROCALTROL) 0.25 MCG capsule Take 0.25 mcg by mouth daily.  Reported on 02/21/2016    [provider]    Physical Exam:   97.5, respirations 12, heart rate 132, blood pressure 128/59  Constitutional: Appears calm, comfortable.  Eyes: Pupils, irises, lids appear normal.  ENT: Lips and tongue appear unremarkable. Hearing grossly normal.  Neck: No lymphadenopathy or masses. No thyromegaly.  Cardiovascular: Tachycardic, irregular rhythm. No murmur, rub or gallop. No lower extremity  edema.  Respiratory: Clear to auscultation bilaterally. No wheezes, rales or rhonchi. Normal respiratory effort.  Abdomen: Soft, nontender, nondistended. No hepatomegaly.  Skin: No rash or induration. Nontender to palpation.  Musculoskeletal: Grossly normal tone and strength all extremity's. No abnormal movements.  Psychiatric: Grossly normal mood and affect. Speech fluent and appropriate. Judgment and insight appear intact.  Wt Readings from Last 3 Encounters:  01/24/17 54.4 kg (120 lb)  03/03/16 54.4 kg (120 lb)  02/21/16 55.7 kg (122 lb 11.2 oz)    I have personally reviewed following labs and imaging studies  Labs:   Basic metabolic panel consistent with end-stage renal disease. Potassium normal.  Troponins 0.09 >> 0.08  CBC unremarkable  INR within normal limits  Imaging studies:   Chest x-ray independently reviewed, no acute disease.  Medical tests: EKGs independently reviewed  0743: Atrial fibrillation with rapid ventricular response   0759: Narrow complex tachycardia, favor atrial flutter.  Test discussed with performing physician:    Decision to obtain old records:     Review and summation of old records:     Principal Problem:   Atrial fibrillation with RVR ([place]) Active Problems:   Essential hypertension   ESRD on dialysis ([place])   Elevated troponin   DM type 2 (diabetes mellitus, type 2) ([place])   Assessment/Plan #1 Atrial fibrillation with rapid ventricular response, new diagnosis. Patient completely asymptomatic, duration unknown. CHA2DS2-VASc 6. -Continue rate control with diltiazem, when stable transition to oral  -Initiate anticoagulation. EDP d/w cardiology who recommended IV heparin and warfarin. -Could consider Eliquis 5 mg BID, will defer to PCP 6/10 -check 2-d echo and TSH  #2: Elevated troponin in the context of rapid heart rate and end-stage renal disease. Asymptomatic. Troponins flat. EKG nonacute. No evidence of ACS. No further  evaluation suggested.   #3: Diabetes mellitus type 2, diet-controlled. -Sliding-scale insulin.  #4: Hypertension, hyperlipidemia -Not on a statin or any antihypertensives. Monitor clinically.  #5: ESRD -Did not receive full treatment today, however there is no volume overload, potassium is normal in she has no respiratory symptoms. Monitor clinically. Consider hemodialysis on Monday.  Aortic atherosclerosis   Severity of Illness: The appropriate patient status for this patient is OBSERVATION. Observation status is judged to be reasonable and necessary in order to provide the required intensity of service to ensure the patient's safety. The patient's presenting symptoms, physical exam findings, and initial radiographic and laboratory data in the context of their medical condition is felt to place them at decreased risk for further clinical deterioration. Furthermore, it is anticipated that the patient will be medically stable for discharge from the hospital within 2 midnights of admission. The following factors support the patient status of observation.   " The physical exam findings include tachycardia.    DVT prophylaxis:heparin, warfarin Code Status: full Family Communication: none Consults called: none    Time spent: 60 minutes  Murray Hodgkins, MD  [facility] Direct contact: [phone] --[place]  --[email or web address]; password [email or web address]  7PM-7AM contact night coverage as above  01/24/2017, 1:05 PM

## 2017-01-24 NOTE — ED Notes (Signed)
CRITICAL VALUE ALERT  Critical Value:  Troponin = 0.09  Date & Time Notied:  01/24/17 0841  Provider Notified: Sabra Heck  Orders Received/Actions taken:

## 2017-01-24 NOTE — ED Triage Notes (Signed)
Pt was at dialysis, tech noticed irregular heart rate, pt states she had half treatment tech says treatment had not started, pt stated she did not feel like her heart rate was abnormal, dialysis tech called EMS.  132/87 HR 130-150's, CBG 141 per EMS

## 2017-01-24 NOTE — ED Notes (Signed)
Cardiezem drip stopped at this time per verbal order Dr. Sabra Heck.  Primary nurse notified.

## 2017-01-24 NOTE — Progress Notes (Signed)
*  PRELIMINARY RESULTS* Echocardiogram 2D Echocardiogram has been performed.  Samuel Germany 01/24/2017, 4:09 PM

## 2017-01-25 DIAGNOSIS — M109 Gout, unspecified: Secondary | ICD-10-CM | POA: Diagnosis present

## 2017-01-25 DIAGNOSIS — I248 Other forms of acute ischemic heart disease: Secondary | ICD-10-CM | POA: Diagnosis present

## 2017-01-25 DIAGNOSIS — I700 Atherosclerosis of aorta: Secondary | ICD-10-CM | POA: Diagnosis present

## 2017-01-25 DIAGNOSIS — R110 Nausea: Secondary | ICD-10-CM | POA: Diagnosis not present

## 2017-01-25 DIAGNOSIS — R112 Nausea with vomiting, unspecified: Secondary | ICD-10-CM | POA: Diagnosis present

## 2017-01-25 DIAGNOSIS — Z85038 Personal history of other malignant neoplasm of large intestine: Secondary | ICD-10-CM | POA: Diagnosis not present

## 2017-01-25 DIAGNOSIS — E1129 Type 2 diabetes mellitus with other diabetic kidney complication: Secondary | ICD-10-CM | POA: Diagnosis not present

## 2017-01-25 DIAGNOSIS — E785 Hyperlipidemia, unspecified: Secondary | ICD-10-CM | POA: Diagnosis present

## 2017-01-25 DIAGNOSIS — E871 Hypo-osmolality and hyponatremia: Secondary | ICD-10-CM | POA: Diagnosis not present

## 2017-01-25 DIAGNOSIS — D649 Anemia, unspecified: Secondary | ICD-10-CM | POA: Diagnosis not present

## 2017-01-25 DIAGNOSIS — Z87891 Personal history of nicotine dependence: Secondary | ICD-10-CM | POA: Diagnosis not present

## 2017-01-25 DIAGNOSIS — E1122 Type 2 diabetes mellitus with diabetic chronic kidney disease: Secondary | ICD-10-CM | POA: Diagnosis present

## 2017-01-25 DIAGNOSIS — Z79899 Other long term (current) drug therapy: Secondary | ICD-10-CM | POA: Diagnosis not present

## 2017-01-25 DIAGNOSIS — D631 Anemia in chronic kidney disease: Secondary | ICD-10-CM | POA: Diagnosis not present

## 2017-01-25 DIAGNOSIS — N186 End stage renal disease: Secondary | ICD-10-CM | POA: Diagnosis present

## 2017-01-25 DIAGNOSIS — Z992 Dependence on renal dialysis: Secondary | ICD-10-CM | POA: Diagnosis not present

## 2017-01-25 DIAGNOSIS — Z8673 Personal history of transient ischemic attack (TIA), and cerebral infarction without residual deficits: Secondary | ICD-10-CM | POA: Diagnosis not present

## 2017-01-25 DIAGNOSIS — I4891 Unspecified atrial fibrillation: Secondary | ICD-10-CM | POA: Diagnosis not present

## 2017-01-25 DIAGNOSIS — E7800 Pure hypercholesterolemia, unspecified: Secondary | ICD-10-CM | POA: Diagnosis present

## 2017-01-25 DIAGNOSIS — I517 Cardiomegaly: Secondary | ICD-10-CM | POA: Diagnosis not present

## 2017-01-25 DIAGNOSIS — Z7982 Long term (current) use of aspirin: Secondary | ICD-10-CM | POA: Diagnosis not present

## 2017-01-25 DIAGNOSIS — Z7189 Other specified counseling: Secondary | ICD-10-CM | POA: Diagnosis not present

## 2017-01-25 DIAGNOSIS — I10 Essential (primary) hypertension: Secondary | ICD-10-CM | POA: Diagnosis not present

## 2017-01-25 DIAGNOSIS — I120 Hypertensive chronic kidney disease with stage 5 chronic kidney disease or end stage renal disease: Secondary | ICD-10-CM | POA: Diagnosis present

## 2017-01-25 DIAGNOSIS — Z7901 Long term (current) use of anticoagulants: Secondary | ICD-10-CM | POA: Diagnosis not present

## 2017-01-25 LAB — CBC
HCT: 31.4 % — ABNORMAL LOW (ref 36.0–46.0)
HEMOGLOBIN: 10.1 g/dL — AB (ref 12.0–15.0)
MCH: 28 pg (ref 26.0–34.0)
MCHC: 32.2 g/dL (ref 30.0–36.0)
MCV: 87 fL (ref 78.0–100.0)
Platelets: 235 10*3/uL (ref 150–400)
RBC: 3.61 MIL/uL — ABNORMAL LOW (ref 3.87–5.11)
RDW: 14.3 % (ref 11.5–15.5)
WBC: 4 10*3/uL (ref 4.0–10.5)

## 2017-01-25 LAB — GLUCOSE, CAPILLARY
GLUCOSE-CAPILLARY: 134 mg/dL — AB (ref 65–99)
GLUCOSE-CAPILLARY: 97 mg/dL (ref 65–99)
Glucose-Capillary: 125 mg/dL — ABNORMAL HIGH (ref 65–99)
Glucose-Capillary: 150 mg/dL — ABNORMAL HIGH (ref 65–99)

## 2017-01-25 LAB — HEPARIN LEVEL (UNFRACTIONATED): Heparin Unfractionated: 0.57 IU/mL (ref 0.30–0.70)

## 2017-01-25 LAB — PROTIME-INR
INR: 1.06
PROTHROMBIN TIME: 13.8 s (ref 11.4–15.2)

## 2017-01-25 LAB — TSH: TSH: 0.043 u[IU]/mL — ABNORMAL LOW (ref 0.350–4.500)

## 2017-01-25 MED ORDER — ONDANSETRON HCL 4 MG/2ML IJ SOLN
4.0000 mg | Freq: Four times a day (QID) | INTRAMUSCULAR | Status: DC | PRN
Start: 2017-01-25 — End: 2017-01-30
  Administered 2017-01-25 – 2017-01-26 (×3): 4 mg via INTRAVENOUS
  Filled 2017-01-25 (×3): qty 2

## 2017-01-25 MED ORDER — PANTOPRAZOLE SODIUM 40 MG IV SOLR
40.0000 mg | Freq: Every day | INTRAVENOUS | Status: DC
  Administered 2017-01-25: 40 mg via INTRAVENOUS
  Filled 2017-01-25: qty 40

## 2017-01-25 MED ORDER — ONDANSETRON HCL 4 MG/2ML IJ SOLN: 4.0000 mg | Freq: Four times a day (QID) | INTRAMUSCULAR | Status: DC

## 2017-01-25 MED ORDER — PROMETHAZINE HCL 25 MG/ML IJ SOLN
12.5000 mg | Freq: Once | INTRAMUSCULAR | Status: AC
  Administered 2017-01-25: 12.5 mg via INTRAVENOUS
  Filled 2017-01-25: qty 1

## 2017-01-25 MED ORDER — WARFARIN SODIUM 7.5 MG PO TABS
7.5000 mg | ORAL_TABLET | Freq: Once | ORAL | Status: AC
  Administered 2017-01-25: 7.5 mg via ORAL
  Filled 2017-01-25: qty 1

## 2017-01-25 NOTE — Progress Notes (Signed)
ANTICOAGULATION CONSULT NOTE -   Pharmacy Consult for Heparin and Coumadin Indication: atrial fibrillation  No Known Allergies  Patient Measurements: Height: 5\' 4"  (162.6 cm) Weight: 122 lb 12.7 oz (55.7 kg) IBW/kg (Calculated) : 54.7 Heparin Dosing Weight: 52.8 kg  Vital Signs: Temp: 97.7 F (36.5 C) (06/10 0000) Temp Source: Oral (06/10 0000) BP: 112/49 (06/10 0600) Pulse Rate: 71 (06/10 0545)  Labs:  Recent Labs  01/24/17 0755 01/24/17 1109 01/24/17 2011 01/25/17 0439  HGB 11.5*  --   --  10.1*  HCT 36.0  --   --  31.4*  PLT 241  --   --  235  LABPROT 13.0  --   --  13.8  INR 0.98  --   --  1.06  HEPARINUNFRC  --   --  0.48 0.57  CREATININE 5.84*  --   --   --   TROPONINI 0.09* 0.08*  --   --     Estimated Creatinine Clearance: 6.6 mL/min (A) (by C-G formula based on SCr of 5.84 mg/dL (H)).   Medical History: Past Medical History:  Diagnosis Date  . 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 Colon cancer ([place])    cecum cancer  . Diabetes mellitus   . ESRD (end stage renal disease) ([place])   . Gout   . Hypercholesteremia   . Hypertension   . Iron deficiency anemia   . Leukopenia   . Stroke [facility]) 2005    Medications:  Prescriptions Prior to Admission  Medication Sig Dispense Refill Last Dose  . aspirin EC 81 MG tablet Take 81 mg by mouth daily.   01/24/2017 at Unknown time  . baclofen (LIORESAL) 10 MG tablet Take 10 mg by mouth daily.   Past Week at Unknown time  . Multiple Vitamin (MULTIVITAMIN) tablet Take 1 tablet by mouth daily.   01/23/2017 at Unknown time  . traMADol (ULTRAM) 50 MG tablet Take 50 mg by mouth 2 (two) times daily.   Past Week at Unknown time  . calcitRIOL (ROCALTROL) 0.25 MCG capsule Take 0.25 mcg by mouth daily. Reported on 02/21/2016   Not Taking at Unknown time    Assessment: 83 yo female ESRD on dialysis.  New onset atrial fibrillation.  Labs reviewed PTA medications reviewed Heparin level at goal last evening and this morning. INR  below goal this morning. Slight drop in HCT/HGB  Goal of Therapy:  Heparin level 0.3-0.7 units/ml Monitor platelets by anticoagulation protocol: Yes   Plan:  Continue heparin infusion at 750 units/hour rate Heparin level daily Coumadin 7.5 mg po x 1 dose today. INR/PT daily Monitor CBC, platelets, signs of bleeding  Abner Greenspan, [place]ana Chavero [place] 01/25/2017,8:16 AM

## 2017-01-25 NOTE — Progress Notes (Signed)
Subjective: Patient was admitted yesterday due to rapid heart rate. She was referred from dialysis center. Patient was was found atrial fibrillation with RVR. She is on Cardizem drip, heparin drip and coumadin. She is complaining of anusea and vomiting. No chest pain.  Objective: Vital signs in last 24 hours: Temp:  [97 F (36.1 C)-97.7 F (36.5 C)] 97.7 F (36.5 C) (06/10 0000) Pulse Rate:  [25-93] 71 (06/10 0545) Resp:  [9-36] 19 (06/10 0600) BP: (79-147)/(49-111) 112/49 (06/10 0600) SpO2:  [95 %-98 %] 98 % (06/10 0400) Weight:  [52.8 kg (116 lb 6.5 oz)-55.7 kg (122 lb 12.7 oz)] 55.7 kg (122 lb 12.7 oz) (06/10 0500) Weight change:  Last BM Date: 01/24/17  Intake/Output from previous day: 06/09 0701 - 06/10 0700 In: 900.8 [P.O.:720; I.V.:180.8] Out: 3 [Urine:2; Stool:1]  PHYSICAL EXAM General appearance: alert, cooperative and no distress Resp: clear to auscultation bilaterally Cardio: irregularly irregular rhythm GI: soft, non-tender; bowel sounds normal; no masses,  no organomegaly Extremities: extremities normal, atraumatic, no cyanosis or edema  Lab Results:  Results for orders placed or performed during the hospital encounter of 01/24/17 (from the past 48 hour(s))  Basic metabolic panel     Status: Abnormal   Collection Time: 01/24/17  7:55 AM  Result Value Ref Range   Sodium 137 135 - 145 mmol/L   Potassium 3.5 3.5 - 5.1 mmol/L   Chloride 93 (L) 101 - 111 mmol/L   CO2 28 22 - 32 mmol/L   Glucose, Bld 137 (H) 65 - 99 mg/dL   BUN 23 (H) 6 - 20 mg/dL   Creatinine, Ser 5.84 (H) 0.44 - 1.00 mg/dL   Calcium 10.1 8.9 - 10.3 mg/dL   GFR calc non Af Amer 6 (L) >60 mL/min   GFR calc Af Amer 7 (L) >60 mL/min    Comment: (NOTE) The eGFR has been calculated using the CKD EPI equation. This calculation has not been validated in all clinical situations. eGFR's persistently <60 mL/min signify possible Chronic Kidney Disease.    Anion gap 16 (H) 5 - 15  Magnesium     Status:  None   Collection Time: 01/24/17  7:55 AM  Result Value Ref Range   Magnesium 1.9 1.7 - 2.4 mg/dL  CBC     Status: Abnormal   Collection Time: 01/24/17  7:55 AM  Result Value Ref Range   WBC 4.1 4.0 - 10.5 K/uL   RBC 4.12 3.87 - 5.11 MIL/uL   Hemoglobin 11.5 (L) 12.0 - 15.0 g/dL   HCT 36.0 36.0 - 46.0 %   MCV 87.4 78.0 - 100.0 fL   MCH 27.9 26.0 - 34.0 pg   MCHC 31.9 30.0 - 36.0 g/dL   RDW 14.3 11.5 - 15.5 %   Platelets 241 150 - 400 K/uL  Troponin I     Status: Abnormal   Collection Time: 01/24/17  7:55 AM  Result Value Ref Range   Troponin I 0.09 (HH) <0.03 ng/mL    Comment: CRITICAL RESULT CALLED TO, READ BACK BY AND VERIFIED WITH: VAUGHN,J AT 8:40AM ON 01/24/17 BY FESTERMAN,C   Protime-INR (if pt is taking coumadin)     Status: None   Collection Time: 01/24/17  7:55 AM  Result Value Ref Range   Prothrombin Time 13.0 11.4 - 15.2 seconds   INR 0.98   Troponin I     Status: Abnormal   Collection Time: 01/24/17 11:09 AM  Result Value Ref Range   Troponin I 0.08 (HH) <0.03  ng/mL    Comment: CRITICAL VALUE NOTED.  VALUE IS CONSISTENT WITH PREVIOUSLY REPORTED AND CALLED VALUE.  MRSA PCR Screening     Status: None   Collection Time: 01/24/17  2:13 PM  Result Value Ref Range   MRSA by PCR NEGATIVE NEGATIVE    Comment:        The GeneXpert MRSA Assay (FDA approved for NASAL specimens only), is one component of a comprehensive MRSA colonization surveillance program. It is not intended to diagnose MRSA infection nor to guide or monitor treatment for MRSA infections.   Glucose, capillary     Status: Abnormal   Collection Time: 01/24/17  4:36 PM  Result Value Ref Range   Glucose-Capillary 135 (H) 65 - 99 mg/dL  Heparin level (unfractionated)     Status: None   Collection Time: 01/24/17  8:11 PM  Result Value Ref Range   Heparin Unfractionated 0.48 0.30 - 0.70 IU/mL    Comment:        IF HEPARIN RESULTS ARE BELOW EXPECTED VALUES, AND PATIENT DOSAGE HAS BEEN  CONFIRMED, SUGGEST FOLLOW UP TESTING OF ANTITHROMBIN III LEVELS.   Glucose, capillary     Status: Abnormal   Collection Time: 01/24/17  9:22 PM  Result Value Ref Range   Glucose-Capillary 131 (H) 65 - 99 mg/dL   Comment 1 Notify RN   TSH     Status: Abnormal   Collection Time: 01/25/17  4:38 AM  Result Value Ref Range   TSH 0.043 (L) 0.350 - 4.500 uIU/mL    Comment: Performed by a 3rd Generation assay with a functional sensitivity of <=0.01 uIU/mL.  CBC     Status: Abnormal   Collection Time: 01/25/17  4:39 AM  Result Value Ref Range   WBC 4.0 4.0 - 10.5 K/uL   RBC 3.61 (L) 3.87 - 5.11 MIL/uL   Hemoglobin 10.1 (L) 12.0 - 15.0 g/dL   HCT 31.4 (L) 36.0 - 46.0 %   MCV 87.0 78.0 - 100.0 fL   MCH 28.0 26.0 - 34.0 pg   MCHC 32.2 30.0 - 36.0 g/dL   RDW 14.3 11.5 - 15.5 %   Platelets 235 150 - 400 K/uL  Heparin level (unfractionated)     Status: None   Collection Time: 01/25/17  4:39 AM  Result Value Ref Range   Heparin Unfractionated 0.57 0.30 - 0.70 IU/mL    Comment:        IF HEPARIN RESULTS ARE BELOW EXPECTED VALUES, AND PATIENT DOSAGE HAS BEEN CONFIRMED, SUGGEST FOLLOW UP TESTING OF ANTITHROMBIN III LEVELS.   Protime-INR     Status: None   Collection Time: 01/25/17  4:39 AM  Result Value Ref Range   Prothrombin Time 13.8 11.4 - 15.2 seconds   INR 1.06   Glucose, capillary     Status: Abnormal   Collection Time: 01/25/17  9:11 AM  Result Value Ref Range   Glucose-Capillary 125 (H) 65 - 99 mg/dL    ABGS No results for input(s): PHART, PO2ART, TCO2, HCO3 in the last 72 hours.  Invalid input(s): PCO2 CULTURES Recent Results (from the past 240 hour(s))  MRSA PCR Screening     Status: None   Collection Time: 01/24/17  2:13 PM  Result Value Ref Range Status   MRSA by PCR NEGATIVE NEGATIVE Final    Comment:        The GeneXpert MRSA Assay (FDA approved for NASAL specimens only), is one component of a comprehensive MRSA colonization surveillance program. It is  not intended to diagnose MRSA infection nor to guide or monitor treatment for MRSA infections.    Studies/Results: Dg Chest Port 1 View  Result Date: 01/24/2017 CLINICAL DATA:  Cardiac arrhythmia. End-stage renal failure. Hypertension. EXAM: PORTABLE CHEST 1 VIEW COMPARISON:  Dec 19, 2014 FINDINGS: There is no edema or consolidation. Heart is enlarged with pulmonary venous hypertension. No adenopathy. There is aortic atherosclerosis. No bone lesions. IMPRESSION: Pulmonary vascular congestion without edema or consolidation. There is aortic atherosclerosis. Electronically Signed   By: Lowella Grip III M.D.   On: 01/24/2017 08:28    Medications: I have reviewed the patient's current medications.  Assesment:  Principal Problem:   Atrial fibrillation with RVR (HCC) Active Problems:   Essential hypertension   ESRD on dialysis (HCC)   Elevated troponin   DM type 2 (diabetes mellitus, type 2) (HCC)   Aortic atherosclerosis (HCC)   Nausea/Vomioting   Plan:  Medications reviewed Continue telemetry cardiology consult Nephrology consult Will continue Cardizem, heparin and coumadin Symptomatic treatment for nausea/vomiting.    LOS: 0 days   , 01/25/2017, 9:39 AM

## 2017-01-25 NOTE — Progress Notes (Signed)
MD notified of pt vomiting, increased edema.  New orders received and noted.

## 2017-01-26 ENCOUNTER — Encounter (HOSPITAL_COMMUNITY): Payer: Self-pay | Admitting: Gastroenterology

## 2017-01-26 DIAGNOSIS — I248 Other forms of acute ischemic heart disease: Secondary | ICD-10-CM

## 2017-01-26 DIAGNOSIS — I700 Atherosclerosis of aorta: Secondary | ICD-10-CM

## 2017-01-26 DIAGNOSIS — D631 Anemia in chronic kidney disease: Secondary | ICD-10-CM

## 2017-01-26 DIAGNOSIS — Z7189 Other specified counseling: Secondary | ICD-10-CM

## 2017-01-26 DIAGNOSIS — R112 Nausea with vomiting, unspecified: Secondary | ICD-10-CM

## 2017-01-26 DIAGNOSIS — D649 Anemia, unspecified: Secondary | ICD-10-CM

## 2017-01-26 LAB — CBC
HEMATOCRIT: 33.8 % — AB (ref 36.0–46.0)
Hemoglobin: 10.7 g/dL — ABNORMAL LOW (ref 12.0–15.0)
MCH: 27.5 pg (ref 26.0–34.0)
MCHC: 31.7 g/dL (ref 30.0–36.0)
MCV: 86.9 fL (ref 78.0–100.0)
Platelets: 224 10*3/uL (ref 150–400)
RBC: 3.89 MIL/uL (ref 3.87–5.11)
RDW: 14.2 % (ref 11.5–15.5)
WBC: 3.8 10*3/uL — ABNORMAL LOW (ref 4.0–10.5)

## 2017-01-26 LAB — GLUCOSE, CAPILLARY
GLUCOSE-CAPILLARY: 145 mg/dL — AB (ref 65–99)
Glucose-Capillary: 111 mg/dL — ABNORMAL HIGH (ref 65–99)
Glucose-Capillary: 129 mg/dL — ABNORMAL HIGH (ref 65–99)
Glucose-Capillary: 160 mg/dL — ABNORMAL HIGH (ref 65–99)

## 2017-01-26 LAB — PROTIME-INR
INR: 1.16
Prothrombin Time: 14.9 seconds (ref 11.4–15.2)

## 2017-01-26 LAB — BASIC METABOLIC PANEL
Anion gap: 15 (ref 5–15)
BUN: 36 mg/dL — AB (ref 6–20)
CALCIUM: 9.5 mg/dL (ref 8.9–10.3)
CO2: 26 mmol/L (ref 22–32)
CREATININE: 8.63 mg/dL — AB (ref 0.44–1.00)
Chloride: 93 mmol/L — ABNORMAL LOW (ref 101–111)
GFR calc Af Amer: 4 mL/min — ABNORMAL LOW (ref >60)
GFR, EST NON AFRICAN AMERICAN: 4 mL/min — AB (ref >60)
GLUCOSE: 123 mg/dL — AB (ref 65–99)
Potassium: 4.2 mmol/L (ref 3.5–5.1)
Sodium: 134 mmol/L — ABNORMAL LOW (ref 135–145)

## 2017-01-26 LAB — IRON AND TIBC
Iron: 121 ug/dL (ref 28–170)
Saturation Ratios: 39 % — ABNORMAL HIGH (ref 10.4–31.8)
TIBC: 308 ug/dL (ref 250–450)
UIBC: 187 ug/dL

## 2017-01-26 LAB — FERRITIN: FERRITIN: 982 ng/mL — AB (ref 11–307)

## 2017-01-26 LAB — HEPARIN LEVEL (UNFRACTIONATED): Heparin Unfractionated: 0.55 IU/mL (ref 0.30–0.70)

## 2017-01-26 MED ORDER — PENTAFLUOROPROP-TETRAFLUOROETH EX AERO
1.0000 "application " | INHALATION_SPRAY | CUTANEOUS | Status: DC | PRN
  Filled 2017-01-26: qty 30

## 2017-01-26 MED ORDER — SODIUM CHLORIDE 0.9 % IV SOLN: 100.0000 mL | INTRAVENOUS | Status: DC | PRN

## 2017-01-26 MED ORDER — PANTOPRAZOLE SODIUM 40 MG IV SOLR
40.0000 mg | Freq: Two times a day (BID) | INTRAVENOUS | Status: DC
  Administered 2017-01-26 – 2017-01-27 (×3): 40 mg via INTRAVENOUS
  Filled 2017-01-26 (×3): qty 40

## 2017-01-26 MED ORDER — LIDOCAINE HCL (PF) 1 % IJ SOLN: 5.0000 mL | INTRAMUSCULAR | Status: DC | PRN

## 2017-01-26 MED ORDER — WARFARIN SODIUM 7.5 MG PO TABS
7.5000 mg | ORAL_TABLET | Freq: Once | ORAL | Status: AC
  Administered 2017-01-26: 7.5 mg via ORAL
  Filled 2017-01-26: qty 1

## 2017-01-26 MED ORDER — LIDOCAINE-PRILOCAINE 2.5-2.5 % EX CREA
1.0000 "application " | TOPICAL_CREAM | CUTANEOUS | Status: DC | PRN
  Filled 2017-01-26: qty 5

## 2017-01-26 MED ORDER — ONDANSETRON HCL 4 MG/2ML IJ SOLN
4.0000 mg | Freq: Three times a day (TID) | INTRAMUSCULAR | Status: DC
  Administered 2017-01-27: 4 mg via INTRAVENOUS
  Filled 2017-01-26: qty 2

## 2017-01-26 NOTE — Progress Notes (Signed)
Upon initial assessment this AM, pt seemed a little confused and had delayed responses. She initially got her birthday wrong, but then corrected herself a few minutes later. She answered all other orientation questions correctly. Family stated that she seemed a little confused this AM as well.   Margaret Pyle, RN

## 2017-01-26 NOTE — Procedures (Signed)
     HEMODIALYSIS TREATMENT NOTE:  3 hour dialysis completed via left upper arm AVF. Goal met: 2 L removed without interruption in ultrafiltration.  Pt was initially obtunded, but became more alert as treatment progressed.  She had to be frequently reoriented and repeatedly reminded not to bend access arm.  She sustained a minor arterial AVF infiltration due to restlessness.  HR 90s-170s; see primary RN notes regarding IV Cardizem adjustments.  All blood was returned and hemostasis was achieved within 10 minutes. Report given to Arelia Sneddon, RN.  Rockwell Alexandria, RN, CDN

## 2017-01-26 NOTE — Plan of Care (Signed)
Problem: Safety: Goal: Ability to remain free from injury will improve Outcome: Progressing Pt is very unsteady on her feet. Pt and family acknowledged and accepted education on the importance of using call bell when needing assistance and to never try to get up alone. Personal items in reach and 3 side rails are up. Bed is in lowest position, bed alarm is on, and call bell within reach. Pt is used to being independent but pt and family agrees that pt is very weak at the moment   Problem: Pain Managment: Goal: General experience of comfort will improve Outcome: Progressing Patient has been free of pain.  Problem: Activity: Goal: Risk for activity intolerance will decrease Outcome: Progressing Patient is mobile, and walks to [facility] with the assistance of the nurse and family.  Problem: Nutrition: Goal: Adequate nutrition will be maintained Outcome: Progressing Patient has been nauseous for the past few days and has not been able to hold down food or pills. Today patient has not been nauseous and was able to consume her breakfast and keep her pills down.

## 2017-01-26 NOTE — Progress Notes (Addendum)
ANTICOAGULATION CONSULT NOTE -   Pharmacy Consult for Heparin and Coumadin Indication: atrial fibrillation  No Known Allergies  Patient Measurements: Height: 5\' 4"  (162.6 cm) Weight: 122 lb 12.7 oz (55.7 kg) IBW/kg (Calculated) : 54.7 Heparin Dosing Weight: 52.8 kg  Vital Signs: Temp: 98.5 F (36.9 C) (06/11 0730) Temp Source: Oral (06/11 0730) BP: 141/77 (06/11 0830) Pulse Rate: 78 (06/11 0830)  Labs:  Recent Labs  01/24/17 0755 01/24/17 1109 01/24/17 2011 01/25/17 0439 01/26/17 0541  HGB 11.5*  --   --  10.1* 10.7*  HCT 36.0  --   --  31.4* 33.8*  PLT 241  --   --  235 224  LABPROT 13.0  --   --  13.8 14.9  INR 0.98  --   --  1.06 1.16  HEPARINUNFRC  --   --  0.48 0.57 0.55  CREATININE 5.84*  --   --   --  8.63*  TROPONINI 0.09* 0.08*  --   --   --     Estimated Creatinine Clearance: 4.5 mL/min (A) (by C-G formula based on SCr of 8.63 mg/dL (H)).   Medical History: Past Medical History:  Diagnosis Date  . 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 Colon cancer ([place])    cecum cancer  . Diabetes mellitus   . ESRD (end stage renal disease) ([place])   . Gout   . Hypercholesteremia   . Hypertension   . Iron deficiency anemia   . Leukopenia   . Stroke [facility]) 2005    Medications:  Prescriptions Prior to Admission  Medication Sig Dispense Refill Last Dose  . aspirin EC 81 MG tablet Take 81 mg by mouth daily.   01/24/2017 at Unknown time  . baclofen (LIORESAL) 10 MG tablet Take 10 mg by mouth daily.   Past Week at Unknown time  . Multiple Vitamin (MULTIVITAMIN) tablet Take 1 tablet by mouth daily.   01/23/2017 at Unknown time  . traMADol (ULTRAM) 50 MG tablet Take 50 mg by mouth 2 (two) times daily.   Past Week at Unknown time  . calcitRIOL (ROCALTROL) 0.25 MCG capsule Take 0.25 mcg by mouth daily. Reported on 02/21/2016   Not Taking at Unknown time    Assessment: 83 yo female ESRD on dialysis.  New onset atrial fibrillation. Labs reviewed PTA medications  reviewed Heparin level remains at goal. INR only a slight increase to 1.16 and remains below goal this morning. HCT/HGB is stable. Patient had nausea and vomiting yesterday evening, may not have kept all of coumadin down. Feels a little better this AM, possible Dialysis today.   Goal of Therapy:  Heparin level 0.3-0.7 units/ml Monitor platelets by anticoagulation protocol: Yes   Plan:  Continue heparin infusion at 750 units/hour rate Heparin level daily Coumadin 7.5 mg po x 1 dose today. INR/PT daily Monitor CBC, platelets, signs of bleeding  Isac Sarna, BS Vena Austria, BCPS Clinical Pharmacist Pager [phone] 01/26/2017,9:07 AM

## 2017-01-26 NOTE — Consult Note (Signed)
Referring Provider: Dr. Legrand Rams  Primary Care Physician:  Rosita Fire, MD Primary Gastroenterologist:  Dr. Oneida Alar   Date of Admission: 01/24/17 Date of Consultation: 01/26/17  Reason for Consultation:  N/V  HPI:  Destiny Boyd is an 83 y.o. year old female with a GI history significant for cecal adenocarcinoma in 2008 s/p right hemicolectomy and resection of TI, with last surveillance colonoscopy in 2009. History of IDA. Multiple other co-morbidites to include ESRD, diabetes, and new onset afib with RVR and admitted on 6/9. Placed on anticoagulation with IV heparin and warfarin. Yesterday began complaining of N/V.   Patient is confused today but slowly orients to place and situation. Spoke with nursing staff as well regarding her presentation, and daughter is at bedside. Patient is unreliable historian due to confusion today but quite pleasant. Last episode of vomiting around 9pm yesterday evening. She was unable to complete prior dialysis session due to new onset afib, and she will hopefully have this done today. No abdominal pain, currently without any active nausea/vomiting at time of consultation. Prior to hospitalization, she lived independently by herself. No constipation, diarrhea, overt GI bleeding, dysphagia. Not interested in eating breakfast this morning.   Past Medical History:  Diagnosis Date  . 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 Colon cancer ([place])    cecum cancer  . Diabetes mellitus   . ESRD (end stage renal disease) ([place])   . Gout   . Hypercholesteremia   . Hypertension   . Iron deficiency anemia   . Leukopenia   . Stroke [facility]) 2005    Past Surgical History:  Procedure Laterality Date  . ABDOMINAL HYSTERECTOMY    . AV FISTULA PLACEMENT Left 08/24/2013   Procedure: ARTERIOVENOUS (AV) FISTULA CREATION- LEFT BRACHIAL CEPHALIC;  Surgeon: Conrad Koochiching, MD;  Location: [facility];  Service: Vascular;  Laterality: Left;  . CATARACT EXTRACTION, BILATERAL     lens implants   . COLON SURGERY  2010  . COLONOSCOPY  2008   Dr. Oneida Alar: 3-4 cm cecal polyp partially removed, path noting cecal adenocarcinoma, underwent right hemicolectomy with TI resection  . COLONOSCOPY  2009   Dr. Oneida Alar: 62mm sessile rectal polyp (benign), rare diverticula  . ESOPHAGOGASTRODUODENOSCOPY  2008   Dr. Oneida Alar: normal esophagus  . ESOPHAGOGASTRODUODENOSCOPY  2009   Dr. Oneida Alar: multiple antral erosions (reactive gastropathy), normal duodenum, normal esophagus  . EYE SURGERY    . GIVENS CAPSULE STUDY  2009   normal   . PERIPHERAL VASCULAR CATHETERIZATION N/A 03/03/2016   Procedure: Fistulagram;  Surgeon: Angelia Mould, MD;  Location: [place] CV LAB;  Service: Cardiovascular;  Laterality: N/A;  . PERIPHERAL VASCULAR CATHETERIZATION Left 03/03/2016   Procedure: Peripheral Vascular Balloon Angioplasty;  Surgeon: Angelia Mould, MD;  Location: [place] CV LAB;  Service: Cardiovascular;  Laterality: Left;  arm fistula pta    Prior to Admission medications   Medication Sig Start Date End Date Taking? Authorizing Provider  aspirin EC 81 MG tablet Take 81 mg by mouth daily.   Yes [provider]  baclofen (LIORESAL) 10 MG tablet Take 10 mg by mouth daily.   Yes [provider]  Multiple Vitamin (MULTIVITAMIN) tablet Take 1 tablet by mouth daily.   Yes [provider]  traMADol (ULTRAM) 50 MG tablet Take 50 mg by mouth 2 (two) times daily.   Yes [provider]  calcitRIOL (ROCALTROL) 0.25 MCG capsule Take 0.25 mcg by mouth daily. Reported on 02/21/2016    [provider]    Current Facility-Administered Medications  Medication Dose Route Frequency Provider Last Rate Last Dose  . acetaminophen (TYLENOL) tablet 650 mg  650 mg Oral Q6H PRN Samuella Cota, MD       Or  . acetaminophen (TYLENOL) suppository 650 mg  650 mg Rectal Q6H PRN Samuella Cota, MD      . aspirin EC tablet 81 mg  81 mg Oral Daily Samuella Cota, MD    81 mg at 01/25/17 0946  . baclofen (LIORESAL) tablet 10 mg  10 mg Oral Daily Samuella Cota, MD   10 mg at 01/25/17 0946  . calcitRIOL (ROCALTROL) capsule 0.25 mcg  0.25 mcg Oral Daily Samuella Cota, MD   0.25 mcg at 01/25/17 0946  . diltiazem (CARDIZEM) 100 mg in dextrose 5 % 100 mL (1 mg/mL) infusion  5-15 mg/hr Intravenous Continuous Noemi Chapel, MD 5 mL/hr at 01/26/17 0604 5 mg/hr at 01/26/17 0604  . heparin ADULT infusion 100 units/mL (25000 units/256mL sodium chloride 0.45%)  750 Units/hr Intravenous Continuous Samuella Cota, MD 7.5 mL/hr at 01/26/17 0300 750 Units/hr at 01/26/17 0300  . insulin aspart (novoLOG) injection 0-9 Units  0-9 Units Subcutaneous TID WC Samuella Cota, MD   1 Units at 01/25/17 1201  . off the beat book   Does not apply Once Rosita Fire, MD      . ondansetron [facility]) injection 4 mg  4 mg Intravenous Q6H PRN Rosita Fire, MD   4 mg at 01/25/17 1650  . pantoprazole (PROTONIX) injection 40 mg  40 mg Intravenous Daily Rosita Fire, MD   40 mg at 01/25/17 2051  . sodium chloride flush (NS) 0.9 % injection 3 mL  3 mL Intravenous Q12H Samuella Cota, MD   3 mL at 01/25/17 2052  . traMADol (ULTRAM) tablet 50 mg  50 mg Oral BID Samuella Cota, MD   50 mg at 01/25/17 0946  . Warfarin - Pharmacist Dosing Inpatient   Does not apply Q24H Samuella Cota, MD        Allergies as of 01/24/2017  . (No Known Allergies)    Family History  Problem Relation Age of Onset  . Diabetes Mother   . Hypertension Mother   . Hyperlipidemia Daughter   . Varicose Veins Daughter   . Cancer Son   . Diabetes Son   . Heart disease Son   . Hyperlipidemia Son     Social History   Social History  . Marital status: Widowed    Spouse name: N/A  . Number of children: N/A  . Years of education: N/A   Occupational History  . Not on file.   Social History Main Topics  . Smoking status: Former Smoker    Packs/day: 1.00    Years: 10.00    Types:  Cigarettes    Quit date: 04/12/1985  . Smokeless tobacco: Never Used  . Alcohol use No  . Drug use: No  . Sexual activity: No   Other Topics Concern  . Not on file   Social History Narrative  . No narrative on file    Review of Systems: Limited due to confusion, as mentioned in HPI.   Physical Exam: Vital signs in last 24 hours: Temp:  [97.1 F (36.2 C)-98.5 F (36.9 C)] 98.5 F (36.9 C) (06/11 0730) Pulse Rate:  [25-154] 38 (06/11 0730) Resp:  [8-22] 19 (06/11 0730) BP: (71-167)/(45-131) 128/68 (06/11 0730) SpO2:  [89 %-100 %]  95 % (06/11 0730) Last BM Date: 01/24/17 General:   Alert, pleasant and cooperative, unable to recall year but knows she is in the hospital, on the 2nd floor.  Head:  Normocephalic and atraumatic. Eyes:  Sclera clear, no icterus.    Ears:  Normal auditory acuity. Nose:  No deformity, discharge,  or lesions. Mouth:  No deformity or lesions, dentition normal. Lungs:  Clear throughout to auscultation.  Heart:  Irregularly irregular, notable murmur Abdomen:  Soft, nontender and nondistended. No masses, hepatosplenomegaly or hernias noted. Normal bowel sounds, without guarding, and without rebound.   Rectal:  Deferred  Msk:  Symmetrical without gross deformities. Extremities:  Without  edema. Neurologic:  Alert and  oriented to person and place  Skin:  Intact without significant lesions or rashes. Psych:  Alert and cooperative.   Intake/Output from previous day: 06/10 0701 - 06/11 0700 In: 260.8 [I.V.:260.8] Out: -  Intake/Output this shift: No intake/output data recorded.  Lab Results:  Recent Labs  01/24/17 0755 01/25/17 0439 01/26/17 0541  WBC 4.1 4.0 3.8*  HGB 11.5* 10.1* 10.7*  HCT 36.0 31.4* 33.8*  PLT 241 235 224   BMET  Recent Labs  01/24/17 0755 01/26/17 0541  NA 137 134*  K 3.5 4.2  CL 93* 93*  CO2 28 26  GLUCOSE 137* 123*  BUN 23* 36*  CREATININE 5.84* 8.63*  CALCIUM 10.1 9.5   PT/INR  Recent Labs   01/25/17 0439 01/26/17 0541  LABPROT 13.8 14.9  INR 1.06 1.16    Studies/Results: Dg Chest Port 1 View  Result Date: 01/24/2017 CLINICAL DATA:  Cardiac arrhythmia. End-stage renal failure. Hypertension. EXAM: PORTABLE CHEST 1 VIEW COMPARISON:  Dec 19, 2014 FINDINGS: There is no edema or consolidation. Heart is enlarged with pulmonary venous hypertension. No adenopathy. There is aortic atherosclerosis. No bone lesions. IMPRESSION: Pulmonary vascular congestion without edema or consolidation. There is aortic atherosclerosis. Electronically Signed   By: Lowella Grip III M.D.   On: 01/24/2017 08:28    Impression: 83 year old female admitted with new onset afib and RVR while undergoing dialysis on 6/9, with N/V starting yesterday. As of this morning, nausea and vomiting have resolved. Mild confusion this morning as well. Likely multifactorial in this setting, and it appears she may be undergoing dialysis today as last session was cut short due to new onset afib. I suspect she may have clinical improvement in GI symptoms once full dialysis completed. Will follow with you and add supportive measures in the interim. No need for endoscopy at this point.   As of note: she is also quite overdue for colonoscopy, as she has a personal history of cecal adenocarcinoma in 2008 and undergoing hemicolectomy at that time. Last surveillance in 2009. If health permits, pursue as outpatient.   Anemia: long-standing history of IDA. Hgb stable. Multifactorial and with likely chronic disease component. No overt GI bleeding. Will need further follow-up as outpatient.   Plan: Increase PPI to BID Add Zofran with meals Hopeful dialysis today Check iron studies Outpatient follow-up for history of colon cancer Will continue to follow with you   Annitta Needs, PhD, ANP-BC [facility]     LOS: 1 day    01/26/2017, 7:55 AM

## 2017-01-26 NOTE — Progress Notes (Signed)
Pt is now more alert.  She continues to get the year wrong during orientation questions but she is talking with family and very perky. Her HR when she talks with family will go to 170's, but then she goes back to 120's-140's. These HR patterns began when dialysis started. Restarted Cardizem. Cardizem is running at 15mg /hr  Margaret Pyle, RN

## 2017-01-26 NOTE — Consult Note (Signed)
Cardiology Consultation:   Patient ID: Destiny Boyd; [id number]; 11-30-1933   Admit date: 01/24/2017 Date of Consult: 01/26/2017  Primary Care Provider: Rosita Fire, MD Primary Cardiologist: New Primary Electrophysiologist:  NA   Patient Profile:   Destiny Boyd is a 83 y.o. female with a hx of ESRD on dialysis, diabetes, CVA, (not embolic) and colon cancer,  who is being seen today for the evaluation of new onset atrial fib at the request of Dr. Legrand Rams. No prior history of cardiac disease or cardiac evaluation in the past.  History of Present Illness:   Destiny Boyd 67 female who was sent to ER after she experienced rapid HR while undergoing dialysis.   On arrival the patient is blood pressures 133/87, heart rate 122 bpm, she was afebrile, O2 sat 97%. EKG demonstrated atrial fibrillation with RVR heart rate 139 bpm with left anterior fascicular block and nonspecific T-wave abnormalities noted. Initial labs revealed elevated troponin at 0.09, and 0.08 respectively. Subsequent troponin showed mildly decreased at 0.48, 0.57, and 0.55.  Other pertinent labs revealed potassium of 3.5, glucose 137, creatinine 5.84, BUN 23. Hemoglobin 11.5. Platelets 241. Chest x-ray revealed pulmonary vascular congestion without edema or consolidation. There was noted aortic atherosclerosis.  She was treated with diltiazem bolus 20 mg, and started on a diltiazem drip, she was also given by mouth diltiazem 30 mg 1. She has subsequently been started on a heparin drip with CHADS VASC Score of 6, (Age, Female, Hx of CVA,HTN). Pharmacy has been consulted and she is now transitioning to by mouth warfarin. PT 14.9, INR 1.16 this a.m. Nephrology has been consulted concerning dialysis management.  Echocardiogram has been completed during this hospitalization. This demonstrated no LV systolic function 99-37%, the left atrium was mildly dilated, the mitral valve was severely calcified with severely thickened  annulus, peak gradient 8 mmHg.  The patient has been severely nauseated with frequent vomiting since admission. Unable to keep down any by mouth medications.  Past Medical History:  Diagnosis Date  . 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 Colon cancer ([place])    cecum cancer  . Diabetes mellitus   . ESRD (end stage renal disease) ([place])   . Gout   . Hypercholesteremia   . Hypertension   . Iron deficiency anemia   . Leukopenia   . Stroke [facility]) 2005    Past Surgical History:  Procedure Laterality Date  . ABDOMINAL HYSTERECTOMY    . AV FISTULA PLACEMENT Left 08/24/2013   Procedure: ARTERIOVENOUS (AV) FISTULA CREATION- LEFT BRACHIAL CEPHALIC;  Surgeon: Conrad Lake Morton-Berrydale, MD;  Location: [place];  Service: Vascular;  Laterality: Left;  . CATARACT EXTRACTION, BILATERAL     lens implants  . COLON SURGERY  2010  . EYE SURGERY    . PERIPHERAL VASCULAR CATHETERIZATION N/A 03/03/2016   Procedure: Fistulagram;  Surgeon: Angelia Mould, MD;  Location: [place] [facility];  Service: Cardiovascular;  Laterality: N/A;  . PERIPHERAL VASCULAR CATHETERIZATION Left 03/03/2016   Procedure: Peripheral Vascular Balloon Angioplasty;  Surgeon: Angelia Mould, MD;  Location: [place] [facility];  Service: Cardiovascular;  Laterality: Left;  arm fistula pta     Inpatient Medications: Scheduled Meds: . aspirin EC  81 mg Oral Daily  . baclofen  10 mg Oral Daily  . calcitRIOL  0.25 mcg Oral Daily  . insulin aspart  0-9 Units Subcutaneous TID WC  . off the beat book   Does not apply Once  . pantoprazole (PROTONIX) IV  40 mg Intravenous Daily  . sodium chloride flush  3 mL Intravenous Q12H  . traMADol  50 mg Oral BID  . Warfarin - Pharmacist Dosing Inpatient   Does not apply Q24H   Continuous Infusions: . diltiazem (CARDIZEM) infusion 5 mg/hr (01/26/17 0604)  . heparin 750 Units/hr (01/26/17 0300)   PRN Meds: acetaminophen **OR** acetaminophen, ondansetron (ZOFRAN) IV  Allergies:   No Known  Allergies  Social History:   Social History   Social History  . Marital status: Widowed    Spouse name: N/A  . Number of children: N/A  . Years of education: N/A   Occupational History  . Not on file.   Social History Main Topics  . Smoking status: Former Smoker    Packs/day: 1.00    Years: 10.00    Types: Cigarettes    Quit date: 04/12/1985  . Smokeless tobacco: Never Used  . Alcohol use No  . Drug use: No  . Sexual activity: No   Other Topics Concern  . Not on file   Social History Narrative  . No narrative on file    Family History:   The patient's family history includes Cancer in her son; Diabetes in her mother and son; Heart disease in her son; Hyperlipidemia in her daughter and son; Hypertension in her mother; Varicose Veins in her daughter.  ROS:  Please see the history of present illness.  ROS  All other ROS reviewed and negative.     Physical Exam/Data:   Vitals:   01/26/17 0645 01/26/17 0700 01/26/17 0715 01/26/17 0730  BP: 138/88 (!) 164/69 (!) 142/66 128/68  Pulse: 69 63 (!) 59 (!) 38  Resp: 14 12 15 19   Temp:    98.5 F (36.9 C)  TempSrc:    Oral  SpO2: 96% (!) 89% 96% 95%  Weight:      Height:        Intake/Output Summary (Last 24 hours) at 01/26/17 0749 Last data filed at 01/26/17 0604  Gross per 24 hour  Intake           260.83 ml  Output                0 ml  Net           260.83 ml   Filed Weights   01/24/17 0747 01/24/17 1311 01/25/17 0500  Weight: 120 lb (54.4 kg) 116 lb 6.5 oz (52.8 kg) 122 lb 12.7 oz (55.7 kg)   Body mass index is 21.08 kg/m.  General:  Well nourished, well developed, in no acute distress, mildly confused HEENT: normal Lymph: no adenopathy Neck: Mild JVD Endocrine:  No thryomegaly Vascular: No carotid bruits; FA pulses 2+ bilaterally without bruits left upper arm dialysis fistula Cardiac:  normal S1, S2; IRRR; 2/6 systolic murmur, heard best at the apex and left sternal border Lungs:  clear to auscultation  bilaterally, no wheezing, rhonchi or rales diminished in the bases Abd: soft, nontender, no hepatomegaly  Ext: no edema Musculoskeletal:  No deformities, BUE and BLE strength normal and equal, generalized deconditioning Skin: warm and dry  Neuro:  CNs 2-12 intact, no focal abnormalities noted Psych:  Normal affect mild confusion   EKG:  The EKG was personally reviewed and demonstrates on admission atrial fib with RVR heart rate of 139 bpm with left anterior fascicular block, nonspecific ST-T wave abnormalities.  Relevant CV Studies: Echocardiogram 01/24/2017 Left ventricle: Wall thickness was increased in a pattern of mild   LVH.  Systolic function was normal. The estimated ejection   fraction was in the range of 55% to 60%. Left ventricular   diastolic function parameters were normal. - Mitral valve: Severely calcified, severely thickened annulus.   Moderately thickened, moderately calcified leaflets . There was   mild regurgitation. - Left atrium: The atrium was mildly dilated. - Atrial septum: No defect or patent foramen ovale was identified. - Pulmonary arteries: PA peak pressure: 35 mm Hg (S).  Laboratory Data:  Chemistry Recent Labs Lab 01/24/17 0755 01/26/17 0541  NA 137 134*  K 3.5 4.2  CL 93* 93*  CO2 28 26  GLUCOSE 137* 123*  BUN 23* 36*  CREATININE 5.84* 8.63*  CALCIUM 10.1 9.5  GFRNONAA 6* 4*  GFRAA 7* 4*  ANIONGAP 16* 15    No results for input(s): PROT, ALBUMIN, AST, ALT, ALKPHOS, BILITOT in the last 168 hours. Hematology Recent Labs Lab 01/24/17 0755 01/25/17 0439 01/26/17 0541  WBC 4.1 4.0 3.8*  RBC 4.12 3.61* 3.89  HGB 11.5* 10.1* 10.7*  HCT 36.0 31.4* 33.8*  MCV 87.4 87.0 86.9  MCH 27.9 28.0 27.5  MCHC 31.9 32.2 31.7  RDW 14.3 14.3 14.2  PLT 241 235 224   Cardiac Enzymes Recent Labs Lab 01/24/17 0755 01/24/17 1109  TROPONINI 0.09* 0.08*   No results for input(s): TROPIPOC in the last 168 hours.  BNPNo results for input(s): BNP, PROBNP  in the last 168 hours.  DDimer No results for input(s): DDIMER in the last 168 hours.  Radiology/Studies:  Dg Chest Port 1 View  Result Date: 01/24/2017 CLINICAL DATA:  Cardiac arrhythmia. End-stage renal failure. Hypertension. EXAM: PORTABLE CHEST 1 VIEW COMPARISON:  Dec 19, 2014 FINDINGS: There is no edema or consolidation. Heart is enlarged with pulmonary venous hypertension. No adenopathy. There is aortic atherosclerosis. No bone lesions. IMPRESSION: Pulmonary vascular congestion without edema or consolidation. There is aortic atherosclerosis. Electronically Signed   By: Lowella Grip III M.D.   On: 01/24/2017 08:28    Assessment and Plan:   1. New onset atrial fib with RVR: History is obtained from past medical records and daughter who is a bedside. Patient is a poor historian with some mild confusion. She is currently on a diltiazem drip rate of 5 mg an hour, and a heparin drip per pharmacy. She's had significant nausea and vomiting and has been unable to hold down any by mouth medications at this time. Heart rate is much better controlled, in the 70s.  Could transition to by mouth diltiazem however with significant nausea and vomiting later on IV drip for now until she is able to keep down by mouth meds. Likely will need dialysis to help with her symptoms. Agree with Coumadin therapy per pharmacy. Can consider transitioning to [place] at renal dose for ease of administration  in this elderly dialysis patient, unless Coumadin therapy can be managed by them with blood draws during dialysis. We'll discuss with Dr. Bronson Ing feasibility of transitioning to [place].    2. History of CVA: Daughter at bedside states this occurred several years ago while patient was living in [place]. The daughter states that it was not related to a blood clot, but related to high blood pressure. She is currently not on any antihypertensives. She was placed on aspirin by primary care in [place].  3.  End-stage renal disease: Has renal dialysis on Tuesdays Thursdays and Saturdays. Her pressure is controlled with dialysis according to family. Review of home medications does not have her  on any antihypertensive therapy.Nephrology is consulted for dialysis management   4. Hypercholesterolemia: Patient is not on any statin therapy. Can consider fasting lipids and LFTs for evaluation.  5. Diabetes: By history. Not on any medications at this time.   6. Anemia: Hx of Iron deficiency anemia. Denies bleeding. Hgb is decreased since admission from 11.5 to 10.7 .       Signed, Jory Sims, NP  01/26/2017 7:49 AM   The patient was seen and examined, and I agree with the history, physical exam, assessment and plan as documented above, with modifications as noted below. I have also personally reviewed all relevant documentation, old records, labs, and both radiographic and cardiovascular studies. I have also independently interpreted old and new ECG's.  83 yr old woman with ESRD on HD hospitalized for rapid atrial fibrillation which developed while undergoing dialysis. She currently denies chest pain, palpitations, and shortness of breath. Had nausea and vomiting earlier, currently attempting to swallow pills.  On IV diltiazem 5 mg/hr with HR 110-120 bpm range. Also on IV heparin.  Would transition to oral diltiazem once she has demonstrated ability to swallow pills.  Would recommend warfarin rather than factor Xa inhibitor for anticoagulation as her atrial fibrillation has a valvular component (severe calcified and thickened annulus, moderately thickened and calcified valves). CHADSVASC score is 7 if taking aortic atherosclerosis into account.  Left atrium is mildly dilated and LV systolic function is normal.  Troponin elevation consistent with demand ischemia in context of ESRD and rapid atrial fibrillation.   Kate Sable, MD, [facility]  01/26/2017 9:28 AM

## 2017-01-26 NOTE — Progress Notes (Signed)
Subjective: Patient had recurrent vomiting last night. She feels nauseated. No chest pain..  Objective: Vital signs in last 24 hours: Temp:  [97.1 F (36.2 C)-98.5 F (36.9 C)] 98.5 F (36.9 C) (06/11 0730) Pulse Rate:  [25-154] 38 (06/11 0730) Resp:  [8-22] 19 (06/11 0730) BP: (71-167)/(45-131) 128/68 (06/11 0730) SpO2:  [89 %-100 %] 95 % (06/11 0730) Weight change:  Last BM Date: 01/24/17  Intake/Output from previous day: 06/10 0701 - 06/11 0700 In: 260.8 [I.V.:260.8] Out: -   PHYSICAL EXAM General appearance: alert, cooperative and no distress Resp: clear to auscultation bilaterally Cardio: irregularly irregular rhythm GI: soft, non-tender; bowel sounds normal; no masses,  no organomegaly Extremities: extremities normal, atraumatic, no cyanosis or edema  Lab Results:  Results for orders placed or performed during the hospital encounter of 01/24/17 (from the past 48 hour(s))  Troponin I     Status: Abnormal   Collection Time: 01/24/17 11:09 AM  Result Value Ref Range   Troponin I 0.08 (HH) <0.03 ng/mL    Comment: CRITICAL VALUE NOTED.  VALUE IS CONSISTENT WITH PREVIOUSLY REPORTED AND CALLED VALUE.  MRSA PCR Screening     Status: None   Collection Time: 01/24/17  2:13 PM  Result Value Ref Range   MRSA by PCR NEGATIVE NEGATIVE    Comment:        The GeneXpert MRSA Assay (FDA approved for NASAL specimens only), is one component of a comprehensive MRSA colonization surveillance program. It is not intended to diagnose MRSA infection nor to guide or monitor treatment for MRSA infections.   Glucose, capillary     Status: Abnormal   Collection Time: 01/24/17  4:36 PM  Result Value Ref Range   Glucose-Capillary 135 (H) 65 - 99 mg/dL  Heparin level (unfractionated)     Status: None   Collection Time: 01/24/17  8:11 PM  Result Value Ref Range   Heparin Unfractionated 0.48 0.30 - 0.70 IU/mL    Comment:        IF HEPARIN RESULTS ARE BELOW EXPECTED VALUES, AND  PATIENT DOSAGE HAS BEEN CONFIRMED, SUGGEST FOLLOW UP TESTING OF ANTITHROMBIN III LEVELS.   Glucose, capillary     Status: Abnormal   Collection Time: 01/24/17  9:22 PM  Result Value Ref Range   Glucose-Capillary 131 (H) 65 - 99 mg/dL   Comment 1 Notify RN   TSH     Status: Abnormal   Collection Time: 01/25/17  4:38 AM  Result Value Ref Range   TSH 0.043 (L) 0.350 - 4.500 uIU/mL    Comment: Performed by a 3rd Generation assay with a functional sensitivity of <=0.01 uIU/mL.  CBC     Status: Abnormal   Collection Time: 01/25/17  4:39 AM  Result Value Ref Range   WBC 4.0 4.0 - 10.5 K/uL   RBC 3.61 (L) 3.87 - 5.11 MIL/uL   Hemoglobin 10.1 (L) 12.0 - 15.0 g/dL   HCT 83.4 (L) 43.5 - 43.5 %   MCV 87.0 78.0 - 100.0 fL   MCH 28.0 26.0 - 34.0 pg   MCHC 32.2 30.0 - 36.0 g/dL   RDW 61.4 33.7 - 08.9 %   Platelets 235 150 - 400 K/uL  Heparin level (unfractionated)     Status: None   Collection Time: 01/25/17  4:39 AM  Result Value Ref Range   Heparin Unfractionated 0.57 0.30 - 0.70 IU/mL    Comment:        IF HEPARIN RESULTS ARE BELOW EXPECTED VALUES, AND PATIENT  DOSAGE HAS BEEN CONFIRMED, SUGGEST FOLLOW UP TESTING OF ANTITHROMBIN III LEVELS.   Protime-INR     Status: None   Collection Time: 01/25/17  4:39 AM  Result Value Ref Range   Prothrombin Time 13.8 11.4 - 15.2 seconds   INR 1.06   Glucose, capillary     Status: Abnormal   Collection Time: 01/25/17  9:11 AM  Result Value Ref Range   Glucose-Capillary 125 (H) 65 - 99 mg/dL  Glucose, capillary     Status: Abnormal   Collection Time: 01/25/17 11:26 AM  Result Value Ref Range   Glucose-Capillary 134 (H) 65 - 99 mg/dL  Glucose, capillary     Status: None   Collection Time: 01/25/17  4:27 PM  Result Value Ref Range   Glucose-Capillary 97 65 - 99 mg/dL  Glucose, capillary     Status: Abnormal   Collection Time: 01/25/17  9:17 PM  Result Value Ref Range   Glucose-Capillary 150 (H) 65 - 99 mg/dL  Heparin level  (unfractionated)     Status: None   Collection Time: 01/26/17  5:41 AM  Result Value Ref Range   Heparin Unfractionated 0.55 0.30 - 0.70 IU/mL    Comment:        IF HEPARIN RESULTS ARE BELOW EXPECTED VALUES, AND PATIENT DOSAGE HAS BEEN CONFIRMED, SUGGEST FOLLOW UP TESTING OF ANTITHROMBIN III LEVELS.   CBC     Status: Abnormal   Collection Time: 01/26/17  5:41 AM  Result Value Ref Range   WBC 3.8 (L) 4.0 - 10.5 K/uL   RBC 3.89 3.87 - 5.11 MIL/uL   Hemoglobin 10.7 (L) 12.0 - 15.0 g/dL   HCT 33.8 (L) 36.0 - 46.0 %   MCV 86.9 78.0 - 100.0 fL   MCH 27.5 26.0 - 34.0 pg   MCHC 31.7 30.0 - 36.0 g/dL   RDW 14.2 11.5 - 15.5 %   Platelets 224 150 - 400 K/uL  Protime-INR     Status: None   Collection Time: 01/26/17  5:41 AM  Result Value Ref Range   Prothrombin Time 14.9 11.4 - 15.2 seconds   INR 2.50   Basic metabolic panel     Status: Abnormal   Collection Time: 01/26/17  5:41 AM  Result Value Ref Range   Sodium 134 (L) 135 - 145 mmol/L   Potassium 4.2 3.5 - 5.1 mmol/L   Chloride 93 (L) 101 - 111 mmol/L   CO2 26 22 - 32 mmol/L   Glucose, Bld 123 (H) 65 - 99 mg/dL   BUN 36 (H) 6 - 20 mg/dL   Creatinine, Ser 8.63 (H) 0.44 - 1.00 mg/dL   Calcium 9.5 8.9 - 10.3 mg/dL   GFR calc non Af Amer 4 (L) >60 mL/min   GFR calc Af Amer 4 (L) >60 mL/min    Comment: (NOTE) The eGFR has been calculated using the CKD EPI equation. This calculation has not been validated in all clinical situations. eGFR's persistently <60 mL/min signify possible Chronic Kidney Disease.    Anion gap 15 5 - 15  Glucose, capillary     Status: Abnormal   Collection Time: 01/26/17  7:39 AM  Result Value Ref Range   Glucose-Capillary 111 (H) 65 - 99 mg/dL   Comment 1 Notify RN    Comment 2 Document in Chart     ABGS No results for input(s): PHART, PO2ART, TCO2, HCO3 in the last 72 hours.  Invalid input(s): PCO2 CULTURES Recent Results (from the past 240 hour(s))  MRSA PCR Screening     Status: None    Collection Time: 01/24/17  2:13 PM  Result Value Ref Range Status   MRSA by PCR NEGATIVE NEGATIVE Final    Comment:        The GeneXpert MRSA Assay (FDA approved for NASAL specimens only), is one component of a comprehensive MRSA colonization surveillance program. It is not intended to diagnose MRSA infection nor to guide or monitor treatment for MRSA infections.    Studies/Results: No results found.  Medications: I have reviewed the patient's current medications.  Assesment:  Principal Problem:   Atrial fibrillation with RVR (HCC) Active Problems:   Essential hypertension   ESRD on dialysis (HCC)   Elevated troponin   DM type 2 (diabetes mellitus, type 2) (HCC)   Aortic atherosclerosis (HCC)   Nausea/Vomioting   Plan:  Medications reviewed Continue telemetry cardiology consult Nephrology consult Will continue Cardizem, heparin and coumadin GI consult    LOS: 1 day   Cheris Tweten 01/26/2017, 8:26 AM

## 2017-01-26 NOTE — Progress Notes (Signed)
Patient experienced an episode of vomiting after consuming some applesauce for lunch. HR increased to 150 during episode and SBP increased to 150's. Restarted pt's Cardizem gtt for a short period of time. Patient is still very confused, Does not know the year, but knows her family, where she is and why she is here. Patient very fidgety and reaching for objects not present. Patient is very weak and states she wants to rest for a while  MD paged of patient's updates. Cardizem gtt turned back off due to BP dropping again.  Margaret Pyle, RN

## 2017-01-26 NOTE — Progress Notes (Signed)
Patient is resting well and not experiencing any N/V at the moment.  Pt O2 sats dropped to the 70's while sleeping. 2L  placed on patient Patient's SBP in 80's while sleeping. Paged MD  Will continue to monitor

## 2017-01-26 NOTE — Consult Note (Signed)
Reason for Consult: End-stage renal disease Referring Physician: Dr. Sherron Monday Destiny Boyd is an 83 y.o. female.  HPI: She is a patient who has history of CVA, diabetes, hypertension, end-stage renal disease on maintenance hemodialysis presently sent from dialysis unit because of irregular and fast heart beats. Patient however denies any chest pain and no palpitation. When she was evaluated she was found to have new atrial fibrillation and hence admitted to the hospital. Presently patient denies any nausea or vomiting. Her appetite however is poor. Patient also denies any difficulty breathing. However patient says that she has some puffiness of her face.  Past Medical History:  Diagnosis Date  . 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 Colon cancer ([place])    cecum cancer  . Diabetes mellitus   . ESRD (end stage renal disease) ([place])   . Gout   . Hypercholesteremia   . Hypertension   . Iron deficiency anemia   . Leukopenia   . Stroke [facility]) 2005    Past Surgical History:  Procedure Laterality Date  . ABDOMINAL HYSTERECTOMY    . AV FISTULA PLACEMENT Left 08/24/2013   Procedure: ARTERIOVENOUS (AV) FISTULA CREATION- LEFT BRACHIAL CEPHALIC;  Surgeon: Conrad Berwyn, MD;  Location: [place];  Service: Vascular;  Laterality: Left;  . CATARACT EXTRACTION, BILATERAL     lens implants  . COLON SURGERY  2010  . COLONOSCOPY  2008   Dr. Oneida Alar: 3-4 cm cecal polyp partially removed, path noting cecal adenocarcinoma, underwent right hemicolectomy with TI resection  . COLONOSCOPY  2009   Dr. Oneida Alar: 48m sessile rectal polyp (benign), rare diverticula  . ESOPHAGOGASTRODUODENOSCOPY  2008   Dr. FOneida Alar normal esophagus  . ESOPHAGOGASTRODUODENOSCOPY  2009   Dr. FOneida Alar multiple antral erosions (reactive gastropathy), normal duodenum, normal esophagus  . EYE SURGERY    . GIVENS CAPSULE STUDY  2009   normal   . HEMICOLECTOMY  2008  . PERIPHERAL VASCULAR CATHETERIZATION N/A 03/03/2016   Procedure: Fistulagram;   Surgeon: CAngelia Mould MD;  [place];  Service: Cardiovascular;  Laterality: N/A;  . PERIPHERAL VASCULAR CATHETERIZATION Left 03/03/2016   Procedure: Peripheral Vascular Balloon Angioplasty;  Surgeon: CAngelia Mould MD;  [place] [facility];  Service: Cardiovascular;  Laterality: Left;  arm fistula pta    Family History  Problem Relation Age of Onset  . Diabetes Mother   . Hypertension Mother   . Hyperlipidemia Daughter   . Varicose Veins Daughter   . Cancer Son   . Diabetes Son   . Heart disease Son   . Hyperlipidemia Son     Social History:  reports that she quit smoking about 31 years ago. Her smoking use included Cigarettes. She has a 10.00 pack-year smoking history. She has never used smokeless tobacco. She reports that she does not drink alcohol or use drugs.  Allergies: No Known Allergies  Medications: I have reviewed the patient's current medications.  Results for orders placed or performed during the hospital encounter of 01/24/17 (from the past 48 hour(s))  Troponin I     Status: Abnormal   Collection Time: 01/24/17 11:09 AM  Result Value Ref Range   Troponin I 0.08 (HH) <0.03 ng/mL    Comment: CRITICAL VALUE NOTED.  VALUE IS CONSISTENT WITH PREVIOUSLY REPORTED AND CALLED VALUE.  MRSA PCR Screening     Status: None   Collection Time: 01/24/17  2:13 PM  Result Value Ref Range   MRSA by PCR NEGATIVE NEGATIVE  Comment:        The GeneXpert MRSA Assay (FDA approved for NASAL specimens only), is one component of a comprehensive MRSA colonization surveillance program. It is not intended to diagnose MRSA infection nor to guide or monitor treatment for MRSA infections.   Glucose, capillary     Status: Abnormal   Collection Time: 01/24/17  4:36 PM  Result Value Ref Range   Glucose-Capillary 135 (H) 65 - 99 mg/dL  Heparin level (unfractionated)     Status: None   Collection Time: 01/24/17  8:11 PM  Result Value Ref  Range   Heparin Unfractionated 0.48 0.30 - 0.70 IU/mL    Comment:        IF HEPARIN RESULTS ARE BELOW EXPECTED VALUES, AND PATIENT DOSAGE HAS BEEN CONFIRMED, SUGGEST FOLLOW UP TESTING OF ANTITHROMBIN III LEVELS.   Glucose, capillary     Status: Abnormal   Collection Time: 01/24/17  9:22 PM  Result Value Ref Range   Glucose-Capillary 131 (H) 65 - 99 mg/dL   Comment 1 Notify RN   TSH     Status: Abnormal   Collection Time: 01/25/17  4:38 AM  Result Value Ref Range   TSH 0.043 (L) 0.350 - 4.500 uIU/mL    Comment: Performed by a 3rd Generation assay with a functional sensitivity of <=0.01 uIU/mL.  CBC     Status: Abnormal   Collection Time: 01/25/17  4:39 AM  Result Value Ref Range   WBC 4.0 4.0 - 10.5 K/uL   RBC 3.61 (L) 3.87 - 5.11 MIL/uL   Hemoglobin 10.1 (L) 12.0 - 15.0 g/dL   HCT 31.4 (L) 36.0 - 46.0 %   MCV 87.0 78.0 - 100.0 fL   MCH 28.0 26.0 - 34.0 pg   MCHC 32.2 30.0 - 36.0 g/dL   RDW 14.3 11.5 - 15.5 %   Platelets 235 150 - 400 K/uL  Heparin level (unfractionated)     Status: None   Collection Time: 01/25/17  4:39 AM  Result Value Ref Range   Heparin Unfractionated 0.57 0.30 - 0.70 IU/mL    Comment:        IF HEPARIN RESULTS ARE BELOW EXPECTED VALUES, AND PATIENT DOSAGE HAS BEEN CONFIRMED, SUGGEST FOLLOW UP TESTING OF ANTITHROMBIN III LEVELS.   Protime-INR     Status: None   Collection Time: 01/25/17  4:39 AM  Result Value Ref Range   Prothrombin Time 13.8 11.4 - 15.2 seconds   INR 1.06   Glucose, capillary     Status: Abnormal   Collection Time: 01/25/17  9:11 AM  Result Value Ref Range   Glucose-Capillary 125 (H) 65 - 99 mg/dL  Glucose, capillary     Status: Abnormal   Collection Time: 01/25/17 11:26 AM  Result Value Ref Range   Glucose-Capillary 134 (H) 65 - 99 mg/dL  Glucose, capillary     Status: None   Collection Time: 01/25/17  4:27 PM  Result Value Ref Range   Glucose-Capillary 97 65 - 99 mg/dL  Glucose, capillary     Status: Abnormal    Collection Time: 01/25/17  9:17 PM  Result Value Ref Range   Glucose-Capillary 150 (H) 65 - 99 mg/dL  Heparin level (unfractionated)     Status: None   Collection Time: 01/26/17  5:41 AM  Result Value Ref Range   Heparin Unfractionated 0.55 0.30 - 0.70 IU/mL    Comment:        IF HEPARIN RESULTS ARE BELOW EXPECTED VALUES, AND PATIENT DOSAGE  HAS BEEN CONFIRMED, SUGGEST FOLLOW UP TESTING OF ANTITHROMBIN III LEVELS.   CBC     Status: Abnormal   Collection Time: 01/26/17  5:41 AM  Result Value Ref Range   WBC 3.8 (L) 4.0 - 10.5 K/uL   RBC 3.89 3.87 - 5.11 MIL/uL   Hemoglobin 10.7 (L) 12.0 - 15.0 g/dL   HCT 33.8 (L) 36.0 - 46.0 %   MCV 86.9 78.0 - 100.0 fL   MCH 27.5 26.0 - 34.0 pg   MCHC 31.7 30.0 - 36.0 g/dL   RDW 14.2 11.5 - 15.5 %   Platelets 224 150 - 400 K/uL  Protime-INR     Status: None   Collection Time: 01/26/17  5:41 AM  Result Value Ref Range   Prothrombin Time 14.9 11.4 - 15.2 seconds   INR 0.30   Basic metabolic panel     Status: Abnormal   Collection Time: 01/26/17  5:41 AM  Result Value Ref Range   Sodium 134 (L) 135 - 145 mmol/L   Potassium 4.2 3.5 - 5.1 mmol/L   Chloride 93 (L) 101 - 111 mmol/L   CO2 26 22 - 32 mmol/L   Glucose, Bld 123 (H) 65 - 99 mg/dL   BUN 36 (H) 6 - 20 mg/dL   Creatinine, Ser 8.63 (H) 0.44 - 1.00 mg/dL   Calcium 9.5 8.9 - 10.3 mg/dL   GFR calc non Af Amer 4 (L) >60 mL/min   GFR calc Af Amer 4 (L) >60 mL/min    Comment: (NOTE) The eGFR has been calculated using the CKD EPI equation. This calculation has not been validated in all clinical situations. eGFR's persistently <60 mL/min signify possible Chronic Kidney Disease.    Anion gap 15 5 - 15  Glucose, capillary     Status: Abnormal   Collection Time: 01/26/17  7:39 AM  Result Value Ref Range   Glucose-Capillary 111 (H) 65 - 99 mg/dL   Comment 1 Notify RN    Comment 2 Document in Chart     No results found.  Review of Systems  Constitutional: Negative for chills and  fever.  Respiratory: Negative for shortness of breath.   Cardiovascular: Negative for palpitations, orthopnea, leg swelling and PND.  Gastrointestinal: Negative for abdominal pain and nausea.       Poor Appetite  Neurological: Negative for dizziness.   Blood pressure 133/72, pulse (!) 118, temperature 98.5 F (36.9 C), temperature source Oral, resp. rate 12, height _0  (1.626 m), weight 55.7 kg (122 lb 12.7 oz), SpO2 96 %. Physical Exam  Constitutional: She is oriented to person, place, and time. No distress.  Eyes: No scleral icterus.  Facial puffiness  Neck: No JVD present.  Cardiovascular:  irigular rate and rythm  Musculoskeletal: She exhibits no edema.  Neurological: She is alert and oriented to person, place, and time.    Assessment/Plan: Problem #1 end-stage renal disease: She is status post short dialysis on Saturday. Presently except for appetite she doesn't have any complaints. Potassium is normal. Problem #2 arterial fibrillation: Patient is on cardiac exam and being followed by cardiology. Problem #3 history of hypertension: Her blood pressure as an outpatient is controlled reasonably. Except Cardiazem  patient is not getting any medication. Problem #4 anemia: Her hemoglobin is within our target goal. Problem #5 history of diabetes Problem #6 history of colon cancer Problem #7 bone and mineral disorder: Her calcium is range. Patient is on a binder as an outpatient. Phosphorus is not available today.  Problem #8 fluid management: Patient doesn't have significant sinus fluid overload except some puffiness of her face. Patient received only partial dialysis on her last treatment. Plan: We'll dialyze patient for 3 hours today 2] to remove about 2 L it systolic blood pressure remains above 90 3] we'll check her renal panel and CBC in the morning  Taneil Lazarus S 01/26/2017, 9:50 AM

## 2017-01-27 ENCOUNTER — Telehealth: Payer: Self-pay | Admitting: Gastroenterology

## 2017-01-27 ENCOUNTER — Encounter: Payer: Self-pay | Admitting: Gastroenterology

## 2017-01-27 DIAGNOSIS — I4891 Unspecified atrial fibrillation: Principal | ICD-10-CM

## 2017-01-27 LAB — CBC
HEMATOCRIT: 35.6 % — AB (ref 36.0–46.0)
HEMOGLOBIN: 11.7 g/dL — AB (ref 12.0–15.0)
MCH: 28.8 pg (ref 26.0–34.0)
MCHC: 32.9 g/dL (ref 30.0–36.0)
MCV: 87.7 fL (ref 78.0–100.0)
Platelets: 232 10*3/uL (ref 150–400)
RBC: 4.06 MIL/uL (ref 3.87–5.11)
RDW: 14.2 % (ref 11.5–15.5)
WBC: 4.2 10*3/uL (ref 4.0–10.5)

## 2017-01-27 LAB — RENAL FUNCTION PANEL
ANION GAP: 14 (ref 5–15)
Albumin: 3.3 g/dL — ABNORMAL LOW (ref 3.5–5.0)
BUN: 19 mg/dL (ref 6–20)
CO2: 27 mmol/L (ref 22–32)
CREATININE: 5.96 mg/dL — AB (ref 0.44–1.00)
Calcium: 10 mg/dL (ref 8.9–10.3)
Chloride: 92 mmol/L — ABNORMAL LOW (ref 101–111)
GFR, EST AFRICAN AMERICAN: 7 mL/min — AB (ref >60)
GFR, EST NON AFRICAN AMERICAN: 6 mL/min — AB (ref >60)
Glucose, Bld: 137 mg/dL — ABNORMAL HIGH (ref 65–99)
PHOSPHORUS: 6.5 mg/dL — AB (ref 2.5–4.6)
Potassium: 4.1 mmol/L (ref 3.5–5.1)
SODIUM: 133 mmol/L — AB (ref 135–145)

## 2017-01-27 LAB — GLUCOSE, CAPILLARY
GLUCOSE-CAPILLARY: 158 mg/dL — AB (ref 65–99)
GLUCOSE-CAPILLARY: 129 mg/dL — AB (ref 65–99)
Glucose-Capillary: 135 mg/dL — ABNORMAL HIGH (ref 65–99)
Glucose-Capillary: 110 mg/dL — ABNORMAL HIGH (ref 65–99)

## 2017-01-27 LAB — HEPARIN LEVEL (UNFRACTIONATED)
HEPARIN UNFRACTIONATED: 1.18 [IU]/mL — AB (ref 0.30–0.70)
Heparin Unfractionated: 0.87 IU/mL — ABNORMAL HIGH (ref 0.30–0.70)

## 2017-01-27 LAB — PROTIME-INR
INR: 1.37
Prothrombin Time: 16.9 seconds — ABNORMAL HIGH (ref 11.4–15.2)

## 2017-01-27 MED ORDER — ONDANSETRON 4 MG PO TBDP
4.0000 mg | ORAL_TABLET | Freq: Three times a day (TID) | ORAL | Status: DC
Start: 2017-01-27 — End: 2017-01-30
  Administered 2017-01-27 – 2017-01-30 (×8): 4 mg via ORAL
  Filled 2017-01-27 (×14): qty 1

## 2017-01-27 MED ORDER — SEVELAMER CARBONATE 800 MG PO TABS
800.0000 mg | ORAL_TABLET | ORAL | Status: DC
  Administered 2017-01-27 – 2017-01-28 (×2): 800 mg via ORAL
  Filled 2017-01-27 (×2): qty 1

## 2017-01-27 MED ORDER — WARFARIN SODIUM 7.5 MG PO TABS
7.5000 mg | ORAL_TABLET | Freq: Once | ORAL | Status: AC
  Administered 2017-01-27: 7.5 mg via ORAL
  Filled 2017-01-27: qty 1

## 2017-01-27 MED ORDER — DILTIAZEM HCL 30 MG PO TABS
30.0000 mg | ORAL_TABLET | Freq: Four times a day (QID) | ORAL | Status: DC
  Administered 2017-01-27 (×4): 30 mg via ORAL
  Filled 2017-01-27 (×6): qty 1

## 2017-01-27 MED ORDER — TRAMADOL HCL 50 MG PO TABS: 50.0000 mg | ORAL_TABLET | Freq: Two times a day (BID) | ORAL | Status: DC | PRN

## 2017-01-27 MED ORDER — PANTOPRAZOLE SODIUM 40 MG PO TBEC
40.0000 mg | DELAYED_RELEASE_TABLET | Freq: Two times a day (BID) | ORAL | Status: DC
  Administered 2017-01-27 – 2017-01-30 (×5): 40 mg via ORAL
  Filled 2017-01-27 (×6): qty 1

## 2017-01-27 MED ORDER — ZOLPIDEM TARTRATE 5 MG PO TABS
5.0000 mg | ORAL_TABLET | Freq: Every evening | ORAL | Status: DC | PRN
  Administered 2017-01-28 – 2017-01-29 (×3): 5 mg via ORAL
  Filled 2017-01-27 (×3): qty 1

## 2017-01-27 MED ORDER — SEVELAMER CARBONATE 800 MG PO TABS
1600.0000 mg | ORAL_TABLET | Freq: Three times a day (TID) | ORAL | Status: DC
  Administered 2017-01-27 – 2017-01-30 (×7): 1600 mg via ORAL
  Filled 2017-01-27 (×10): qty 2

## 2017-01-27 NOTE — Telephone Encounter (Signed)
Please arrange outpatient appt with Dr. Oneida Alar to discuss ?need for colonoscopy with history of colon cancer in 6-8 weeks.

## 2017-01-27 NOTE — Care Management Note (Signed)
Case Management Note  Patient Details  Name: Destiny Boyd MRN: [id number] Date of Birth: Aug 03, 1934  Subjective/Objective:   Adm with Afib with RVR. From home alone, ind PTA. Patient somewhat confused today. Information gained from son, [name] at bedside. Patient has family support. Goes to [facility] in [place] on T-Th-Sat Schedule. Rides [facility] to Dialysis per son. Recommended for [place] PT and RW. Offered choice. No preference of [place] agencies.    Son asked about RN/aide to stay with his mother during the day. CM discussed what insurance will pay for and will not. Son aware they may have to check into Medicaid for patient if they do need 24/7 care in the future.  At this time, family plans to attend to patient's needs.      Action/Plan: Romualdo Bolk of [facility] notified and will obtain orders from chart. RW will be delivered to room prior to discharge. Family made aware [facility] has 48 hours to initiate services.    Expected Discharge Date:  01/29/17               Expected Discharge Plan:  [place]  In-House Referral:     Discharge planning Services  CM Consult  Post Acute Care Choice:  Durable Medical Equipment, Home Health Choice offered to:  Patient, Adult Children  DME Arranged:  [facility] rolling DME Agency:  [place] Arranged:  RN, PT [facility] Agency:  [place]  Status of Service:  In process, will continue to follow  If discussed at Long Length of Stay Meetings, dates discussed:    Additional Comments:  Indria Bishara, Chauncey [place], RN 01/27/2017, 2:16 PM

## 2017-01-27 NOTE — Evaluation (Signed)
Physical Therapy Evaluation Patient Details Name: Destiny Boyd MRN: [id number] DOB: 24-Oct-1933 Today's Date: 01/27/2017   History of Present Illness  Destiny Boyd is an 83yo black female who comes to [facility] directly from uncompleted dialysis session due to tachycardia. Pt admitted with AF c RVR. PMH: ESRD on HD T/R/Sa; DM, Colon CA, anemia, CVA, HTN. Pt lives alone, fully independent community dwelling adult, with tranportatiion assistance from family. Family is very involved in [place].   Clinical Impression  Pt admitted with above diagnosis. Pt currently with functional limitations due to the deficits listed below (see "PT Problem List"). Upon entry, the patient is received semirecumbent in bed, son [name] is present. The pt is awake and agreeable to participate. No acute distress noted at this time. Mild tachycardia to 110s during session c mobility, and desat to 82% on room air trial. Functional mobility assessment demonstrates mild-moderate weakness, the pt now requiring Mod-assist physical assistance for transfers due to unexpected BLE buckling x3, and gait is limited to single digit distances, whereas the patient independently AMb in the community s AD at baseline. The patient is at high risk for falls as evidence by gait speed <1.105m/s, forward reach <5", and multiple LOB demonstrated throughout session. PT recommending close supervision for all mobility at this time c AD due to BLE instability, and 24/7 assistance, which Son reports family is able to provide would prefer over facility placement. Pt will benefit from skilled PT intervention to increase independence and safety with basic mobility in preparation for discharge to the venue listed below.       Follow Up Recommendations Home health PT;Supervision/Assistance - 24 hour (son [name], reports family is prepared and able to provide 24/7 assistance. )    Equipment Recommendations  Other (comment) (pediatric RW)    Recommendations for  Other Services       Precautions / Restrictions Precautions Precautions: Fall Restrictions Weight Bearing Restrictions: No      Mobility  Bed Mobility Overal bed mobility: Needs Assistance Bed Mobility: Supine to Sit     Supine to sit: Supervision     General bed mobility comments: Safety awareness is questionable at this time, mild impulsivity v excitement   Transfers Overall transfer level: Needs assistance Equipment used: Rolling walker (2 wheeled) ( PedRW ) Transfers: Sit to/from Stand Sit to Stand: Min guard         General transfer comment: difficulty obtaining balance, 3 instances of spontaneous BLE buckling, requires maxA from PT to prevent fall to floor.   Ambulation/Gait Ambulation/Gait assistance: Min assist Ambulation Distance (Feet): 5 Feet (bed to chair only) Assistive device: Rolling walker (2 wheeled) (pedRW)     Gait velocity interpretation: <1.8 ft/sec, indicative of risk for recurrent falls General Gait Details: heavy cues for turning to avoid tangling in lines/leads; BLE buckling with collapse into chair  Stairs            Wheelchair Mobility    Modified Rankin (Stroke Patients Only)       Balance Overall balance assessment: Needs assistance;No apparent balance deficits (not formally assessed)                                           Pertinent Vitals/Pain Pain Assessment: No/denies pain    Home Living Family/patient expects to be discharged to:: Private residence Living Arrangements: Alone Available Help at Discharge: Family Type of Home:  House Home Access: Level entry     Home Layout: One level Home Equipment: [place] - single point Additional Comments: Does not use/need SPC.     Prior Function Level of Independence: Independent   Gait / Transfers Assistance Needed: AMB in community c Son without limitations (attested by Jeanella Anton)   ADL's / Homemaking Assistance Needed: Independent        Hand  Dominance        Extremity/Trunk Assessment   Upper Extremity Assessment Upper Extremity Assessment: Generalized weakness    Lower Extremity Assessment Lower Extremity Assessment: Generalized weakness;Overall WFL for tasks assessed       Communication   Communication: No difficulties  Cognition Arousal/Alertness: Awake/alert Behavior During Therapy: WFL for tasks assessed/performed Overall Cognitive Status: Within Functional Limits for tasks assessed                                        General Comments      Exercises     Assessment/Plan    PT Assessment Patient needs continued PT services  PT Problem List Decreased strength;Decreased activity tolerance;Decreased balance;Decreased mobility;Decreased knowledge of use of DME       PT Treatment Interventions Therapeutic activities;Therapeutic exercise;Functional mobility training;Gait training;Balance training;Stair training;Patient/family education;DME instruction    PT Goals (Current goals can be found in the Care Plan section)  Acute Rehab PT Goals Patient Stated Goal: return to home and PLOF PT Goal Formulation: With patient Time For Goal Achievement: 02/10/17 Potential to Achieve Goals: Good    Frequency Min 2X/week   Barriers to discharge        Co-evaluation               AM-PAC PT "6 Clicks" Daily Activity  Outcome Measure Difficulty turning over in bed (including adjusting bedclothes, sheets and blankets)?: A Little Difficulty moving from lying on back to sitting on the side of the bed? : A Little Difficulty sitting down on and standing up from a chair with arms (e.g., wheelchair, bedside commode, etc,.)?: A Lot Help needed moving to and from a bed to chair (including a wheelchair)?: A Lot Help needed walking in hospital room?: Total Help needed climbing 3-5 steps with a railing? : Total 6 Click Score: 12    End of Session Equipment Utilized During Treatment: Gait  belt;Oxygen Activity Tolerance: Patient tolerated treatment well;Patient limited by fatigue;Treatment limited secondary to medical complications (Comment) (desaturation on RA, mild tachycardia ) Patient left: in chair;with call bell/phone within reach;with family/visitor present Nurse Communication: Mobility status;Other (comment) (buckling x3) PT Visit Diagnosis: Muscle weakness (generalized) (M62.81);Difficulty in walking, not elsewhere classified (R26.2);Unsteadiness on feet (R26.81)    Time: 3007-6226 PT Time Calculation (min) (ACUTE ONLY): 20 min   Charges:   PT Evaluation $PT Eval Low Complexity: 1 Procedure PT Treatments $Therapeutic Activity: 8-22 mins   PT G Codes:        2:25 PM, Feb 03, 2017 Etta Grandchild, PT, DPT Physical Therapist - [place] [phone] (Office)    Keishawna Carranza C 2017/02/03, 2:21 PM

## 2017-01-27 NOTE — Progress Notes (Signed)
Spoke with Dr.Gosrani (on call [facility]) regarding patient being restless and not sleeping well for the last two nights. Per daughter's request she would like the patient to have something for sleep. New order given as follows:  1.) Ambien 5mg  PO QHS PRN for sleep

## 2017-01-27 NOTE — Progress Notes (Signed)
Subjective: Interval History: has no complaint of difficulty breathing. Patient complains of lack of sleep otherwise no other complaints..  Objective: Vital signs in last 24 hours: Temp:  [97.3 F (36.3 C)-98.7 F (37.1 C)] 98.7 F (37.1 C) (06/12 0000) Pulse Rate:  [39-151] 93 (06/12 0600) Resp:  [7-29] 16 (06/12 0600) BP: (77-173)/(45-123) 138/79 (06/12 0600) SpO2:  [83 %-100 %] 88 % (06/12 0600) Weight:  [56.5 kg (124 lb 9 oz)] 56.5 kg (124 lb 9 oz) (06/11 1525) Weight change:   Intake/Output from previous day: 06/11 0701 - 06/12 0700 In: 1076.4 [P.O.:800; I.V.:276.4] Out: 600 [Urine:600] Intake/Output this shift: No intake/output data recorded.  General appearance: alert, cooperative and no distress Resp: clear to auscultation bilaterally Cardio: irregularly irregular rhythm Extremities: extremities normal, atraumatic, no cyanosis or edema  Lab Results:  Recent Labs  01/26/17 0541 01/27/17 0520  WBC 3.8* 4.2  HGB 10.7* 11.7*  HCT 33.8* 35.6*  PLT 224 232   BMET:  Recent Labs  01/26/17 0541 01/27/17 0520  NA 134* 133*  K 4.2 4.1  CL 93* 92*  CO2 26 27  GLUCOSE 123* 137*  BUN 36* 19  CREATININE 8.63* 5.96*  CALCIUM 9.5 10.0   No results for input(s): PTH in the last 72 hours. Iron Studies:  Recent Labs  01/26/17 0927  IRON 121  TIBC 308  FERRITIN 982*    Studies/Results: No results found.  I have reviewed the patient's current medications.  Assessment/Plan: Problem #1 of atrial fibrillation: Presently she is on Cardizem and her heart rate is controlled. Problem #2 end-stage renal disease: She is status post hemodialysis yesterday. Presently she doesn't have any nausea or vomiting. Appetite is good and potassium is normal Problem #3 fluid management: Were able to remove about 2 L yesterday with dialysis. Patient at this moment doesn't have any sign of fluid overload Problem #4 anemia: Her hemoglobin is within our target goal Problem #5  hypertension: Her blood pressure is reasonably controlled Problem #6 bone and metabolic disorder: Her calcium is in range. Phosphorus is high Problem #7 diabetes: Her blood sugar is reasonably controlled. Plan: We'll make arrangements for patient to get dialysis tomorrow  2] will start patient on Renvela 800 mg 2 tablet by mouth 3 times a day with meals and one snack 3] will check CBC and renal panel in the morning.    LOS: 2 days   Kiev Labrosse S 01/27/2017,8:22 AM

## 2017-01-27 NOTE — Progress Notes (Signed)
    Subjective: No further vomiting since lunch prior to dialysis. Tolerated dinner and breakfast. No nausea. No abdominal pain. Remains pleasantly confused.   Objective: Vital signs in last 24 hours: Temp:  [97.3 F (36.3 C)-98.7 F (37.1 C)] 98.7 F (37.1 C) (06/12 0000) Pulse Rate:  [39-151] 93 (06/12 0600) Resp:  [7-29] 16 (06/12 0600) BP: (77-173)/(45-123) 138/79 (06/12 0600) SpO2:  [83 %-100 %] 88 % (06/12 0600) Weight:  [124 lb 9 oz (56.5 kg)] 124 lb 9 oz (56.5 kg) (06/11 1525) Last BM Date: 01/24/17 General:   Alert and oriented to person, situation, and place. Unknown year.  Head:  Normocephalic and atraumatic. Eyes:  No icterus, sclera clear. Conjuctiva pink.  Abdomen:  Bowel sounds present, soft, non-tender, non-distended. No HSM or hernias noted. No rebound or guarding. No masses appreciated  Psych:  Alert and cooperative. Normal mood and affect.  Intake/Output from previous day: 06/11 0701 - 06/12 0700 In: 1076.4 [P.O.:800; I.V.:276.4] Out: 600 [Urine:600] Intake/Output this shift: No intake/output data recorded.  Lab Results:  Recent Labs  01/25/17 0439 01/26/17 0541 01/27/17 0520  WBC 4.0 3.8* 4.2  HGB 10.1* 10.7* 11.7*  HCT 31.4* 33.8* 35.6*  PLT 235 224 232   BMET  Recent Labs  01/26/17 0541 01/27/17 0520  NA 134* 133*  K 4.2 4.1  CL 93* 92*  CO2 26 27  GLUCOSE 123* 137*  BUN 36* 19  CREATININE 8.63* 5.96*  CALCIUM 9.5 10.0   LFT  Recent Labs  01/27/17 0520  ALBUMIN 3.3*   PT/INR  Recent Labs  01/26/17 0541 01/27/17 0520  LABPROT 14.9 16.9*  INR 1.16 1.37    Assessment: 83 year old female admitted with new onset afib and RVR while undergoing dialysis on 6/9, with GI consulted due to N/V. One episode of vomiting after lunch noted, but she tolerated supper and has done well since this time. Dialysis completed yesterday evening. Clinically improved with supportive measures.   As of note: she is also quite overdue for  colonoscopy, as she has a personal history of cecal adenocarcinoma in 2008 and undergoing hemicolectomy at that time. Last surveillance in 2009. If health permits, pursue as outpatient.   Anemia: long-standing history of IDA. Hgb stable. Multifactorial and with likely chronic disease component. No overt GI bleeding.   Plan: Continue PPI BID Change Zofran to oral; may change to prn if continues to do well Outpatient follow-up with our office due to history of colon cancer: we will arrange Will follow peripherally  Annitta Needs, PhD, ANP-BC [facility]    LOS: 2 days    01/27/2017, 8:12 AM

## 2017-01-27 NOTE — Progress Notes (Signed)
ANTICOAGULATION CONSULT NOTE -   Pharmacy Consult for Heparin and Coumadin Indication: atrial fibrillation  No Known Allergies  Patient Measurements: Height: 5\' 4"  (162.6 cm) Weight: 124 lb 9 oz (56.5 kg) IBW/kg (Calculated) : 54.7 Heparin Dosing Weight: 52.8 kg  Vital Signs: Temp: 98.7 F (37.1 C) (06/12 0000) BP: 138/79 (06/12 0600) Pulse Rate: 93 (06/12 0600)  Labs:  Recent Labs  01/24/17 0755 01/24/17 1109  01/25/17 0439 01/26/17 0541 01/27/17 0520  HGB 11.5*  --   --  10.1* 10.7* 11.7*  HCT 36.0  --   --  31.4* 33.8* 35.6*  PLT 241  --   --  235 224 232  LABPROT 13.0  --   --  13.8 14.9 16.9*  INR 0.98  --   --  1.06 1.16 1.37  HEPARINUNFRC  --   --   < > 0.57 0.55 1.18*  CREATININE 5.84*  --   --   --  8.63* 5.96*  TROPONINI 0.09* 0.08*  --   --   --   --   < > = values in this interval not displayed.  Estimated Creatinine Clearance: 6.5 mL/min (A) (by C-G formula based on SCr of 5.96 mg/dL (H)).   Medical History: Past Medical History:  Diagnosis Date  . 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 Colon cancer ([place])    cecum cancer  . Diabetes mellitus   . ESRD (end stage renal disease) ([place])   . Gout   . Hypercholesteremia   . Hypertension   . Iron deficiency anemia   . Leukopenia   . Stroke [facility]) 2005    Medications:  Prescriptions Prior to Admission  Medication Sig Dispense Refill Last Dose  . aspirin EC 81 MG tablet Take 81 mg by mouth daily.   01/24/2017 at Unknown time  . baclofen (LIORESAL) 10 MG tablet Take 10 mg by mouth daily.   Past Week at Unknown time  . Multiple Vitamin (MULTIVITAMIN) tablet Take 1 tablet by mouth daily.   01/23/2017 at Unknown time  . traMADol (ULTRAM) 50 MG tablet Take 50 mg by mouth 2 (two) times daily.   Past Week at Unknown time  . calcitRIOL (ROCALTROL) 0.25 MCG capsule Take 0.25 mcg by mouth daily. Reported on 02/21/2016   Not Taking at Unknown time    Assessment: 83 yo female ESRD on dialysis.  New onset atrial  fibrillation. Labs reviewed PTA medications reviewed Heparin level supratherapeutic this AM. INR increase to 1.37 and remains below goal this morning. HCT/HGB is stable.Patient was able to keep pills down yesterday and no nausea reported.  Patient had Dialysis yesterday.   Goal of Therapy:  Heparin level 0.3-0.7 units/ml Monitor platelets by anticoagulation protocol: Yes   Plan:  Hold heparin for 1 hour, then decrease infusion  600 units/hour rate Heparin level in 8 hours and daily Coumadin 7.5 mg po x 1 dose today. INR/PT daily Monitor CBC, platelets, signs of bleeding  Isac Sarna, BS [place], BCPS Clinical Pharmacist Pager [phone] 01/27/2017,7:35 AM

## 2017-01-27 NOTE — Progress Notes (Signed)
UFH LEVEL 1.18. PHARMACY NOTIIFIED. HEPARIN ON HOLD FOR NOW UNTIL DOSE RECALCULATED IN 1HR

## 2017-01-27 NOTE — Progress Notes (Signed)
PT REMAINS AWAKE AND PLEASANTLY CONFUSED. SON REMAINS W/ HER AT BEDSIDE.

## 2017-01-27 NOTE — Progress Notes (Signed)
Subjective: Patient feels better today. Her nausea and vomiting has subsided. She is tolerating her meals. However, she is somewhat confused and weak...  Objective: Vital signs in last 24 hours: Temp:  [97.3 F (36.3 C)-98.7 F (37.1 C)] 98.7 F (37.1 C) (06/12 0000) Pulse Rate:  [39-151] 93 (06/12 0600) Resp:  [7-29] 16 (06/12 0600) BP: (77-173)/(45-123) 138/79 (06/12 0600) SpO2:  [83 %-100 %] 88 % (06/12 0600) Weight:  [56.5 kg (124 lb 9 oz)] 56.5 kg (124 lb 9 oz) (06/11 1525) Weight change:  Last BM Date: 01/24/17  Intake/Output from previous day: 06/11 0701 - 06/12 0700 In: 1076.4 [P.O.:800; I.V.:276.4] Out: 600 [Urine:600]  PHYSICAL EXAM General appearance: alert, cooperative and no distress Resp: clear to auscultation bilaterally Cardio: irregularly irregular rhythm GI: soft, non-tender; bowel sounds normal; no masses,  no organomegaly Extremities: extremities normal, atraumatic, no cyanosis or edema  Lab Results:  Results for orders placed or performed during the hospital encounter of 01/24/17 (from the past 48 hour(s))  Glucose, capillary     Status: Abnormal   Collection Time: 01/25/17  9:11 AM  Result Value Ref Range   Glucose-Capillary 125 (H) 65 - 99 mg/dL  Glucose, capillary     Status: Abnormal   Collection Time: 01/25/17 11:26 AM  Result Value Ref Range   Glucose-Capillary 134 (H) 65 - 99 mg/dL  Glucose, capillary     Status: None   Collection Time: 01/25/17  4:27 PM  Result Value Ref Range   Glucose-Capillary 97 65 - 99 mg/dL  Glucose, capillary     Status: Abnormal   Collection Time: 01/25/17  9:17 PM  Result Value Ref Range   Glucose-Capillary 150 (H) 65 - 99 mg/dL  Heparin level (unfractionated)     Status: None   Collection Time: 01/26/17  5:41 AM  Result Value Ref Range   Heparin Unfractionated 0.55 0.30 - 0.70 IU/mL    Comment:        IF HEPARIN RESULTS ARE BELOW EXPECTED VALUES, AND PATIENT DOSAGE HAS BEEN CONFIRMED, SUGGEST FOLLOW UP  TESTING OF ANTITHROMBIN III LEVELS.   CBC     Status: Abnormal   Collection Time: 01/26/17  5:41 AM  Result Value Ref Range   WBC 3.8 (L) 4.0 - 10.5 K/uL   RBC 3.89 3.87 - 5.11 MIL/uL   Hemoglobin 10.7 (L) 12.0 - 15.0 g/dL   HCT 33.8 (L) 36.0 - 46.0 %   MCV 86.9 78.0 - 100.0 fL   MCH 27.5 26.0 - 34.0 pg   MCHC 31.7 30.0 - 36.0 g/dL   RDW 14.2 11.5 - 15.5 %   Platelets 224 150 - 400 K/uL  Protime-INR     Status: None   Collection Time: 01/26/17  5:41 AM  Result Value Ref Range   Prothrombin Time 14.9 11.4 - 15.2 seconds   INR 7.06   Basic metabolic panel     Status: Abnormal   Collection Time: 01/26/17  5:41 AM  Result Value Ref Range   Sodium 134 (L) 135 - 145 mmol/L   Potassium 4.2 3.5 - 5.1 mmol/L   Chloride 93 (L) 101 - 111 mmol/L   CO2 26 22 - 32 mmol/L   Glucose, Bld 123 (H) 65 - 99 mg/dL   BUN 36 (H) 6 - 20 mg/dL   Creatinine, Ser 8.63 (H) 0.44 - 1.00 mg/dL   Calcium 9.5 8.9 - 10.3 mg/dL   GFR calc non Af Amer 4 (L) >60 mL/min   GFR calc  Af Amer 4 (L) >60 mL/min    Comment: (NOTE) The eGFR has been calculated using the CKD EPI equation. This calculation has not been validated in all clinical situations. eGFR's persistently <60 mL/min signify possible Chronic Kidney Disease.    Anion gap 15 5 - 15  Glucose, capillary     Status: Abnormal   Collection Time: 01/26/17  7:39 AM  Result Value Ref Range   Glucose-Capillary 111 (H) 65 - 99 mg/dL   Comment 1 Notify RN    Comment 2 Document in Chart   Iron and TIBC     Status: Abnormal   Collection Time: 01/26/17  9:27 AM  Result Value Ref Range   Iron 121 28 - 170 ug/dL   TIBC 308 250 - 450 ug/dL   Saturation Ratios 39 (H) 10.4 - 31.8 %   UIBC 187 ug/dL    Comment: Performed at [facility], [place] [address] [place], [place] [address]  Ferritin     Status: Abnormal   Collection Time: 01/26/17  9:27 AM  Result Value Ref Range   Ferritin 982 (H) 11 - 307 ng/mL    Comment: Performed at [facility],  [place] [address] [place], [place] [address]  Glucose, capillary     Status: Abnormal   Collection Time: 01/26/17 11:39 AM  Result Value Ref Range   Glucose-Capillary 145 (H) 65 - 99 mg/dL   Comment 1 Notify RN    Comment 2 Document in Chart   Glucose, capillary     Status: Abnormal   Collection Time: 01/26/17  4:53 PM  Result Value Ref Range   Glucose-Capillary 129 (H) 65 - 99 mg/dL   Comment 1 Notify RN    Comment 2 Document in Chart   Glucose, capillary     Status: Abnormal   Collection Time: 01/26/17  9:14 PM  Result Value Ref Range   Glucose-Capillary 160 (H) 65 - 99 mg/dL  Heparin level (unfractionated)     Status: Abnormal   Collection Time: 01/27/17  5:20 AM  Result Value Ref Range   Heparin Unfractionated 1.18 (H) 0.30 - 0.70 IU/mL    Comment: RESULT REPEATED AND VERIFIED CRITICAL RESULT CALLED TO, READ BACK BY AND VERIFIED WITH: PHILLIPS,C AT 0947 BY HUFFINES,S ON 01/27/17.        IF HEPARIN RESULTS ARE BELOW EXPECTED VALUES, AND PATIENT DOSAGE HAS BEEN CONFIRMED, SUGGEST FOLLOW UP TESTING OF ANTITHROMBIN III LEVELS.   CBC     Status: Abnormal   Collection Time: 01/27/17  5:20 AM  Result Value Ref Range   WBC 4.2 4.0 - 10.5 K/uL   RBC 4.06 3.87 - 5.11 MIL/uL   Hemoglobin 11.7 (L) 12.0 - 15.0 g/dL   HCT 35.6 (L) 36.0 - 46.0 %   MCV 87.7 78.0 - 100.0 fL   MCH 28.8 26.0 - 34.0 pg   MCHC 32.9 30.0 - 36.0 g/dL   RDW 14.2 11.5 - 15.5 %   Platelets 232 150 - 400 K/uL  Protime-INR     Status: Abnormal   Collection Time: 01/27/17  5:20 AM  Result Value Ref Range   Prothrombin Time 16.9 (H) 11.4 - 15.2 seconds   INR 1.37   Renal function panel     Status: Abnormal   Collection Time: 01/27/17  5:20 AM  Result Value Ref Range   Sodium 133 (L) 135 - 145 mmol/L   Potassium 4.1 3.5 - 5.1 mmol/L   Chloride 92 (L) 101 -  111 mmol/L   CO2 27 22 - 32 mmol/L   Glucose, Bld 137 (H) 65 - 99 mg/dL   BUN 19 6 - 20 mg/dL   Creatinine, Ser 5.96 (H) 0.44 - 1.00 mg/dL   Calcium 10.0  8.9 - 10.3 mg/dL   Phosphorus 6.5 (H) 2.5 - 4.6 mg/dL   Albumin 3.3 (L) 3.5 - 5.0 g/dL   GFR calc non Af Amer 6 (L) >60 mL/min   GFR calc Af Amer 7 (L) >60 mL/min    Comment: (NOTE) The eGFR has been calculated using the CKD EPI equation. This calculation has not been validated in all clinical situations. eGFR's persistently <60 mL/min signify possible Chronic Kidney Disease.    Anion gap 14 5 - 15  Glucose, capillary     Status: Abnormal   Collection Time: 01/27/17  7:56 AM  Result Value Ref Range   Glucose-Capillary 158 (H) 65 - 99 mg/dL    ABGS No results for input(s): PHART, PO2ART, TCO2, HCO3 in the last 72 hours.  Invalid input(s): PCO2 CULTURES Recent Results (from the past 240 hour(s))  MRSA PCR Screening     Status: None   Collection Time: 01/24/17  2:13 PM  Result Value Ref Range Status   MRSA by PCR NEGATIVE NEGATIVE Final    Comment:        The GeneXpert MRSA Assay (FDA approved for NASAL specimens only), is one component of a comprehensive MRSA colonization surveillance program. It is not intended to diagnose MRSA infection nor to guide or monitor treatment for MRSA infections.    Studies/Results: No results found.  Medications: I have reviewed the patient's current medications.  Assesment:  Principal Problem:   Atrial fibrillation with RVR (HCC) Active Problems:   Essential hypertension   ESRD on dialysis (HCC)   Elevated troponin   DM type 2 (diabetes mellitus, type 2) (HCC)   Aortic atherosclerosis (HCC)   Nausea and vomiting   Anemia   Demand ischemia of myocardium ([place])   Encounter for anticoagulation discussion and counseling   Nausea/Vomioting   Plan:  Medications reviewed Continue telemetry cardiology consult appreciated Nephrology consult appreciated Continue anticoagulation Physical therapy   LOS: 2 days   Destiny Boyd 01/27/2017, 8:15 AM

## 2017-01-27 NOTE — Progress Notes (Signed)
ANTICOAGULATION CONSULT NOTE -   Pharmacy Consult for Heparin and Coumadin Indication: atrial fibrillation  No Known Allergies  Patient Measurements: Height: 5\' 4"  (162.6 cm) Weight: 124 lb 9 oz (56.5 kg) IBW/kg (Calculated) : 54.7 Heparin Dosing Weight: 52.8 kg  Vital Signs: BP: 160/78 (06/12 1300) Pulse Rate: 107 (06/12 1300)  Labs:  Recent Labs  01/25/17 0439 01/26/17 0541 01/27/17 0520 01/27/17 1544  HGB 10.1* 10.7* 11.7*  --   HCT 31.4* 33.8* 35.6*  --   PLT 235 224 232  --   LABPROT 13.8 14.9 16.9*  --   INR 1.06 1.16 1.37  --   HEPARINUNFRC 0.57 0.55 1.18* 0.87*  CREATININE  --  8.63* 5.96*  --     Estimated Creatinine Clearance: 6.5 mL/min (A) (by C-G formula based on SCr of 5.96 mg/dL (H)).   Medical History: Past Medical History:  Diagnosis Date  . 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 Colon cancer ([place])    cecum cancer  . Diabetes mellitus   . ESRD (end stage renal disease) ([place])   . Gout   . Hypercholesteremia   . Hypertension   . Iron deficiency anemia   . Leukopenia   . Stroke [facility]) 2005    Medications:  Prescriptions Prior to Admission  Medication Sig Dispense Refill Last Dose  . aspirin EC 81 MG tablet Take 81 mg by mouth daily.   01/24/2017 at Unknown time  . baclofen (LIORESAL) 10 MG tablet Take 10 mg by mouth daily.   Past Week at Unknown time  . Multiple Vitamin (MULTIVITAMIN) tablet Take 1 tablet by mouth daily.   01/23/2017 at Unknown time  . traMADol (ULTRAM) 50 MG tablet Take 50 mg by mouth 2 (two) times daily.   Past Week at Unknown time  . calcitRIOL (ROCALTROL) 0.25 MCG capsule Take 0.25 mcg by mouth daily. Reported on 02/21/2016   Not Taking at Unknown time    Assessment: 83 yo female ESRD on dialysis.  New onset atrial fibrillation. Labs reviewed PTA medications reviewed Heparin level is supratherapeutic this afternoon. INR increase to 1.37 and remains below goal this morning. HCT/HGB is stable.Patient was able to keep pills down  yesterday and no nausea reported.  Patient had Dialysis yesterday.   Goal of Therapy:  Heparin level 0.3-0.7 units/ml Monitor platelets by anticoagulation protocol: Yes   Plan:  Decrease infusion  500 units/hour  Heparin level in 6-8 hours and daily Coumadin 7.5 mg po x 1 dose today. INR/PT daily Monitor CBC, platelets, signs of bleeding  Isac Sarna, BS [place], BCPS Clinical Pharmacist Pager [phone] 01/27/2017,4:41 PM

## 2017-01-27 NOTE — Progress Notes (Signed)
Progress Note  Patient Name: Destiny Boyd Date of Encounter: 01/27/2017  Primary Cardiologist: Bronson Ing  Subjective   Hungry. No complaints of nausea or vomiting. No chest pain.  Inpatient Medications    Scheduled Meds: . aspirin EC  81 mg Oral Daily  . baclofen  10 mg Oral Daily  . calcitRIOL  0.25 mcg Oral Daily  . insulin aspart  0-9 Units Subcutaneous TID WC  . off the beat book   Does not apply Once  . ondansetron (ZOFRAN) IV  4 mg Intravenous TID AC  . pantoprazole (PROTONIX) IV  40 mg Intravenous Q12H  . sodium chloride flush  3 mL Intravenous Q12H  . traMADol  50 mg Oral BID  . warfarin  7.5 mg Oral Once  . Warfarin - Pharmacist Dosing Inpatient   Does not apply Q24H   Continuous Infusions: . sodium chloride    . sodium chloride    . diltiazem (CARDIZEM) infusion 6 mg/hr (01/27/17 0600)  . heparin Stopped (01/27/17 5361)   PRN Meds: sodium chloride, sodium chloride, acetaminophen **OR** acetaminophen, lidocaine (PF), lidocaine-prilocaine, ondansetron (ZOFRAN) IV, pentafluoroprop-tetrafluoroeth   Vital Signs    Vitals:   01/27/17 0300 01/27/17 0400 01/27/17 0500 01/27/17 0600  BP: (!) 173/88 (!) 158/84 (!) 164/123 138/79  Pulse: 60 82 89 93  Resp: 13 15 (!) 24 16  Temp:      TempSrc:      SpO2: 96% 100% 100% (!) 88%  Weight:      Height:        Intake/Output Summary (Last 24 hours) at 01/27/17 0755 Last data filed at 01/27/17 0600  Gross per 24 hour  Intake          1076.42 ml  Output              600 ml  Net           476.42 ml   Filed Weights   01/24/17 1311 01/25/17 0500 01/26/17 1525  Weight: 116 lb 6.5 oz (52.8 kg) 122 lb 12.7 oz (55.7 kg) 124 lb 9 oz (56.5 kg)    Telemetry    Atrial flutter, HR 80's-102 bpm. - Personally Reviewed  Physical Exam   GEN: No acute distress.   Neck: No JVD Cardiac: IRRR, 2/6 holosystolic murmur, heard best at the apex and LSB,  No rubs, or gallops.  Respiratory: Clear to auscultation  bilaterally. GI: Soft, nontender, non-distended  MS: No edema; No deformity. AV fistula in her upper left arm. Neuro:  Nonfocal  Psych: Mild confusion.   Labs    Chemistry Recent Labs Lab 01/24/17 0755 01/26/17 0541 01/27/17 0520  NA 137 134* 133*  K 3.5 4.2 4.1  CL 93* 93* 92*  CO2 28 26 27   GLUCOSE 137* 123* 137*  BUN 23* 36* 19  CREATININE 5.84* 8.63* 5.96*  CALCIUM 10.1 9.5 10.0  ALBUMIN  --   --  3.3*  GFRNONAA 6* 4* 6*  GFRAA 7* 4* 7*  ANIONGAP 16* 15 14     Hematology Recent Labs Lab 01/25/17 0439 01/26/17 0541 01/27/17 0520  WBC 4.0 3.8* 4.2  RBC 3.61* 3.89 4.06  HGB 10.1* 10.7* 11.7*  HCT 31.4* 33.8* 35.6*  MCV 87.0 86.9 87.7  MCH 28.0 27.5 28.8  MCHC 32.2 31.7 32.9  RDW 14.3 14.2 14.2  PLT 235 224 232    Cardiac Enzymes Recent Labs Lab 01/24/17 0755 01/24/17 1109  TROPONINI 0.09* 0.08*    Radiology  No results found.  Cardiac Studies  Echocardiogram 01/24/2017 Left ventricle: Wall thickness was increased in a pattern of mild   LVH. Systolic function was normal. The estimated ejection   fraction was in the range of 55% to 60%. Left ventricular   diastolic function parameters were normal. - Mitral valve: Severely calcified, severely thickened annulus.   Moderately thickened, moderately calcified leaflets . There was   mild regurgitation. - Left atrium: The atrium was mildly dilated. - Atrial septum: No defect or patent foramen ovale was identified. - Pulmonary arteries: PA peak pressure: 35 mm Hg (S).  Patient Profile     83 y.o. female  83 y.o. female with a hx of ESRD on dialysis, diabetes, CVA, (not embolic) and colon cancer,  who is being seen today for the evaluation of new onset atrial fib at the request of Dr. Legrand Rams. No prior history of cardiac disease or cardiac evaluation in the past.  Assessment & Plan    1. New Onset Atrial fib/flutter: Heart rate is better controlled on diltiazem gtt at 5 mg hour. Now that she is eating  without vomiting, will transition to po diltiazem 30 mg Q 6 hours and monitor HR response. Continues on heparin per pharmacy, with coumadin dosing. INR this am 1.37, PT 16.9. No at candidate for DOAC in the setting of valvular heart disease. CHADS VASC Score of 7. Potentially move to telemetry once weaned off of IV diltiazem.   2. Hypertension: BP slightly elevated this am, but has been improving after dialysis. Continue diltiazem only for now. Can add second agent if necessary.   3. Mitral Valve disease: Severely thickened and calcified annulus. Currently no chest pain or dyspnea.   4. End-stage Renal Disease: Now on dialysis per Dr. Hinda Lenis.  5. Hx of CVA: No focal weakness. Continue ASA     Signed, Phill Myron. West Pugh, ANP, AACC   01/27/2017, 7:55 AM    The patient was seen and examined, and I agree with the history, physical exam, assessment and plan as documented above, with modifications as noted below.  Doing well this morning. Denies chest pain and shortness of breath. Has been hypertensive. Will transition from IV to oral diltiazem. Denies nausea and vomiting. Continue IV heparin and warfarin for anticoagulation.   Kate Sable, MD, [facility]  01/27/2017 9:14 AM

## 2017-01-28 DIAGNOSIS — E871 Hypo-osmolality and hyponatremia: Secondary | ICD-10-CM

## 2017-01-28 LAB — RENAL FUNCTION PANEL
ALBUMIN: 2.8 g/dL — AB (ref 3.5–5.0)
ANION GAP: 14 (ref 5–15)
BUN: 27 mg/dL — ABNORMAL HIGH (ref 6–20)
CALCIUM: 9.5 mg/dL (ref 8.9–10.3)
CO2: 25 mmol/L (ref 22–32)
Chloride: 88 mmol/L — ABNORMAL LOW (ref 101–111)
Creatinine, Ser: 7.12 mg/dL — ABNORMAL HIGH (ref 0.44–1.00)
GFR, EST AFRICAN AMERICAN: 6 mL/min — AB (ref >60)
GFR, EST NON AFRICAN AMERICAN: 5 mL/min — AB (ref >60)
GLUCOSE: 146 mg/dL — AB (ref 65–99)
PHOSPHORUS: 7.5 mg/dL — AB (ref 2.5–4.6)
Potassium: 4.5 mmol/L (ref 3.5–5.1)
SODIUM: 127 mmol/L — AB (ref 135–145)

## 2017-01-28 LAB — GLUCOSE, CAPILLARY
GLUCOSE-CAPILLARY: 122 mg/dL — AB (ref 65–99)
GLUCOSE-CAPILLARY: 106 mg/dL — AB (ref 65–99)
Glucose-Capillary: 120 mg/dL — ABNORMAL HIGH (ref 65–99)
Glucose-Capillary: 181 mg/dL — ABNORMAL HIGH (ref 65–99)

## 2017-01-28 LAB — CBC
HEMATOCRIT: 29.4 % — AB (ref 36.0–46.0)
HEMOGLOBIN: 9.6 g/dL — AB (ref 12.0–15.0)
MCH: 28.2 pg (ref 26.0–34.0)
MCHC: 32.7 g/dL (ref 30.0–36.0)
MCV: 86.5 fL (ref 78.0–100.0)
PLATELETS: 189 10*3/uL (ref 150–400)
RBC: 3.4 MIL/uL — AB (ref 3.87–5.11)
RDW: 13.2 % (ref 11.5–15.5)
WBC: 3.3 10*3/uL — AB (ref 4.0–10.5)

## 2017-01-28 LAB — PROTIME-INR
INR: 2.31
Prothrombin Time: 25.8 seconds — ABNORMAL HIGH (ref 11.4–15.2)

## 2017-01-28 LAB — HEPARIN LEVEL (UNFRACTIONATED): Heparin Unfractionated: 0.79 IU/mL — ABNORMAL HIGH (ref 0.30–0.70)

## 2017-01-28 LAB — HEPATITIS B SURFACE ANTIGEN: HEP B S AG: NEGATIVE

## 2017-01-28 MED ORDER — DILTIAZEM HCL 60 MG PO TABS
60.0000 mg | ORAL_TABLET | Freq: Four times a day (QID) | ORAL | Status: DC
Start: 2017-01-28 — End: 2017-01-29
  Administered 2017-01-28 – 2017-01-29 (×4): 60 mg via ORAL
  Filled 2017-01-28 (×4): qty 1

## 2017-01-28 MED ORDER — DEXTROSE 5 % IV SOLN
INTRAVENOUS | Status: AC
  Filled 2017-01-28: qty 100

## 2017-01-28 MED ORDER — DEXTROSE 5 % IV SOLN
5.0000 mg/h | INTRAVENOUS | Status: DC
  Administered 2017-01-28: 5 mg/h via INTRAVENOUS

## 2017-01-28 MED ORDER — WARFARIN SODIUM 2.5 MG PO TABS
2.5000 mg | ORAL_TABLET | Freq: Once | ORAL | Status: AC
  Administered 2017-01-28: 2.5 mg via ORAL
  Filled 2017-01-28: qty 1

## 2017-01-28 NOTE — Progress Notes (Signed)
Daughter Destiny Boyd) is requesting for Case manager/Social worker to call her at [phone] in regards to services and options upon discharge.

## 2017-01-28 NOTE — Progress Notes (Signed)
Subjective: Patient is resting. She is restarted on cardizem drip due to heart rate running over 100/minute. Her INR is now therapeutic. Her nausea and vomiting subsided.  Objective: Vital signs in last 24 hours: Temp:  [98.4 F (36.9 C)-98.9 F (37.2 C)] 98.4 F (36.9 C) (06/13 0712) Pulse Rate:  [30-120] 53 (06/13 1027) Resp:  [8-27] 12 (06/13 1027) BP: (85-183)/(54-106) 85/54 (06/13 1027) SpO2:  [75 %-100 %] 100 % (06/13 1027) Weight change:  Last BM Date: 01/24/17  Intake/Output from previous day: 06/12 0701 - 06/13 0700 In: 868.8 [P.O.:720; I.V.:148.8] Out: -   PHYSICAL EXAM General appearance: alert, cooperative and no distress Resp: clear to auscultation bilaterally Cardio: irregularly irregular rhythm GI: soft, non-tender; bowel sounds normal; no masses,  no organomegaly Extremities: extremities normal, atraumatic, no cyanosis or edema  Lab Results:  Results for orders placed or performed during the hospital encounter of 01/24/17 (from the past 48 hour(s))  Glucose, capillary     Status: Abnormal   Collection Time: 01/26/17 11:39 AM  Result Value Ref Range   Glucose-Capillary 145 (H) 65 - 99 mg/dL   Comment 1 Notify RN    Comment 2 Document in Chart   Glucose, capillary     Status: Abnormal   Collection Time: 01/26/17  4:53 PM  Result Value Ref Range   Glucose-Capillary 129 (H) 65 - 99 mg/dL   Comment 1 Notify RN    Comment 2 Document in Chart   Glucose, capillary     Status: Abnormal   Collection Time: 01/26/17  9:14 PM  Result Value Ref Range   Glucose-Capillary 160 (H) 65 - 99 mg/dL  Heparin level (unfractionated)     Status: Abnormal   Collection Time: 01/27/17  5:20 AM  Result Value Ref Range   Heparin Unfractionated 1.18 (H) 0.30 - 0.70 IU/mL    Comment: RESULT REPEATED AND VERIFIED CRITICAL RESULT CALLED TO, READ BACK BY AND VERIFIED WITH: PHILLIPS,C AT 1660 BY HUFFINES,S ON 01/27/17.        IF HEPARIN RESULTS ARE BELOW EXPECTED VALUES, AND  PATIENT DOSAGE HAS BEEN CONFIRMED, SUGGEST FOLLOW UP TESTING OF ANTITHROMBIN III LEVELS.   CBC     Status: Abnormal   Collection Time: 01/27/17  5:20 AM  Result Value Ref Range   WBC 4.2 4.0 - 10.5 K/uL   RBC 4.06 3.87 - 5.11 MIL/uL   Hemoglobin 11.7 (L) 12.0 - 15.0 g/dL   HCT 35.6 (L) 36.0 - 46.0 %   MCV 87.7 78.0 - 100.0 fL   MCH 28.8 26.0 - 34.0 pg   MCHC 32.9 30.0 - 36.0 g/dL   RDW 14.2 11.5 - 15.5 %   Platelets 232 150 - 400 K/uL  Protime-INR     Status: Abnormal   Collection Time: 01/27/17  5:20 AM  Result Value Ref Range   Prothrombin Time 16.9 (H) 11.4 - 15.2 seconds   INR 1.37   Renal function panel     Status: Abnormal   Collection Time: 01/27/17  5:20 AM  Result Value Ref Range   Sodium 133 (L) 135 - 145 mmol/L   Potassium 4.1 3.5 - 5.1 mmol/L   Chloride 92 (L) 101 - 111 mmol/L   CO2 27 22 - 32 mmol/L   Glucose, Bld 137 (H) 65 - 99 mg/dL   BUN 19 6 - 20 mg/dL   Creatinine, Ser 5.96 (H) 0.44 - 1.00 mg/dL   Calcium 10.0 8.9 - 10.3 mg/dL   Phosphorus 6.5 (H) 2.5 -  4.6 mg/dL   Albumin 3.3 (L) 3.5 - 5.0 g/dL   GFR calc non Af Amer 6 (L) >60 mL/min   GFR calc Af Amer 7 (L) >60 mL/min    Comment: (NOTE) The eGFR has been calculated using the CKD EPI equation. This calculation has not been validated in all clinical situations. eGFR's persistently <60 mL/min signify possible Chronic Kidney Disease.    Anion gap 14 5 - 15  Glucose, capillary     Status: Abnormal   Collection Time: 01/27/17  7:56 AM  Result Value Ref Range   Glucose-Capillary 158 (H) 65 - 99 mg/dL  Glucose, capillary     Status: Abnormal   Collection Time: 01/27/17 11:45 AM  Result Value Ref Range   Glucose-Capillary 135 (H) 65 - 99 mg/dL  Heparin level (unfractionated)     Status: Abnormal   Collection Time: 01/27/17  3:44 PM  Result Value Ref Range   Heparin Unfractionated 0.87 (H) 0.30 - 0.70 IU/mL    Comment:        IF HEPARIN RESULTS ARE BELOW EXPECTED VALUES, AND PATIENT DOSAGE HAS  BEEN CONFIRMED, SUGGEST FOLLOW UP TESTING OF ANTITHROMBIN III LEVELS.   Glucose, capillary     Status: Abnormal   Collection Time: 01/27/17  4:52 PM  Result Value Ref Range   Glucose-Capillary 110 (H) 65 - 99 mg/dL  Glucose, capillary     Status: Abnormal   Collection Time: 01/27/17  9:34 PM  Result Value Ref Range   Glucose-Capillary 129 (H) 65 - 99 mg/dL  Heparin level (unfractionated)     Status: Abnormal   Collection Time: 01/28/17  1:18 AM  Result Value Ref Range   Heparin Unfractionated 0.79 (H) 0.30 - 0.70 IU/mL    Comment:        IF HEPARIN RESULTS ARE BELOW EXPECTED VALUES, AND PATIENT DOSAGE HAS BEEN CONFIRMED, SUGGEST FOLLOW UP TESTING OF ANTITHROMBIN III LEVELS.   CBC     Status: Abnormal   Collection Time: 01/28/17  5:26 AM  Result Value Ref Range   WBC 3.3 (L) 4.0 - 10.5 K/uL   RBC 3.40 (L) 3.87 - 5.11 MIL/uL   Hemoglobin 9.6 (L) 12.0 - 15.0 g/dL   HCT 29.4 (L) 36.0 - 46.0 %   MCV 86.5 78.0 - 100.0 fL   MCH 28.2 26.0 - 34.0 pg   MCHC 32.7 30.0 - 36.0 g/dL   RDW 13.2 11.5 - 15.5 %   Platelets 189 150 - 400 K/uL  Protime-INR     Status: Abnormal   Collection Time: 01/28/17  5:26 AM  Result Value Ref Range   Prothrombin Time 25.8 (H) 11.4 - 15.2 seconds   INR 2.31   Renal function panel     Status: Abnormal   Collection Time: 01/28/17  5:26 AM  Result Value Ref Range   Sodium 127 (L) 135 - 145 mmol/L   Potassium 4.5 3.5 - 5.1 mmol/L   Chloride 88 (L) 101 - 111 mmol/L   CO2 25 22 - 32 mmol/L   Glucose, Bld 146 (H) 65 - 99 mg/dL   BUN 27 (H) 6 - 20 mg/dL   Creatinine, Ser 7.12 (H) 0.44 - 1.00 mg/dL   Calcium 9.5 8.9 - 10.3 mg/dL   Phosphorus 7.5 (H) 2.5 - 4.6 mg/dL   Albumin 2.8 (L) 3.5 - 5.0 g/dL   GFR calc non Af Amer 5 (L) >60 mL/min   GFR calc Af Amer 6 (L) >60 mL/min      Comment: (NOTE) The eGFR has been calculated using the CKD EPI equation. This calculation has not been validated in all clinical situations. eGFR's persistently <60 mL/min  signify possible Chronic Kidney Disease.    Anion gap 14 5 - 15  Glucose, capillary     Status: Abnormal   Collection Time: 01/28/17  7:11 AM  Result Value Ref Range   Glucose-Capillary 122 (H) 65 - 99 mg/dL    ABGS No results for input(s): PHART, PO2ART, TCO2, HCO3 in the last 72 hours.  Invalid input(s): PCO2 CULTURES Recent Results (from the past 240 hour(s))  MRSA PCR Screening     Status: None   Collection Time: 01/24/17  2:13 PM  Result Value Ref Range Status   MRSA by PCR NEGATIVE NEGATIVE Final    Comment:        The GeneXpert MRSA Assay (FDA approved for NASAL specimens only), is one component of a comprehensive MRSA colonization surveillance program. It is not intended to diagnose MRSA infection nor to guide or monitor treatment for MRSA infections.    Studies/Results: No results found.  Medications: I have reviewed the patient's current medications.  Assesment:  Principal Problem:   Atrial fibrillation with RVR (HCC) Active Problems:   Essential hypertension   ESRD on dialysis (HCC)   Elevated troponin   DM type 2 (diabetes mellitus, type 2) (HCC)   Aortic atherosclerosis (HCC)   Nausea and vomiting   Anemia   Demand ischemia of myocardium (HCC)   Encounter for anticoagulation discussion and counseling   Nausea/Vomioting   Plan:  Medications reviewed Continue telemetry Continue adjust Cardizem  Continue coumadin and will continue to monitor INR  LOS: 3 days   , 01/28/2017, 11:21 AM  

## 2017-01-28 NOTE — Procedures (Signed)
    HEMODIALYSIS TREATMENT NOTE:  3.5 hour heparin-free dialysis completed via left upper arm AVF (16g/antegrade). Goal met: 2 liters removed without interruption in ultrafiltration.  All blood was returned and hemostasis was achieved within 10 minutes. Report given to Deno Etienne, RN.  Rockwell Alexandria, RN, CDN

## 2017-01-28 NOTE — Progress Notes (Signed)
ANTICOAGULATION CONSULT NOTE -   Pharmacy Consult for Heparin and Coumadin Indication: atrial fibrillation  No Known Allergies  Patient Measurements: Height: 5\' 4"  (162.6 cm) Weight: 124 lb 9 oz (56.5 kg) IBW/kg (Calculated) : 54.7 Heparin Dosing Weight: 52.8 kg  Vital Signs: Temp: 98.4 F (36.9 C) (06/13 0712) Temp Source: Axillary (06/13 0712) BP: 132/79 (06/13 0800) Pulse Rate: 64 (06/13 0800)  Labs:  Recent Labs  01/26/17 0541 01/27/17 0520 01/27/17 1544 01/28/17 0118 01/28/17 0526  HGB 10.7* 11.7*  --   --  9.6*  HCT 33.8* 35.6*  --   --  29.4*  PLT 224 232  --   --  189  LABPROT 14.9 16.9*  --   --  25.8*  INR 1.16 1.37  --   --  2.31  HEPARINUNFRC 0.55 1.18* 0.87* 0.79*  --   CREATININE 8.63* 5.96*  --   --  7.12*   Estimated Creatinine Clearance: 5.4 mL/min (A) (by C-G formula based on SCr of 7.12 mg/dL (H)).  Medical History: Past Medical History:  Diagnosis Date  . 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 Colon cancer ([place])    cecum cancer  . Diabetes mellitus   . ESRD (end stage renal disease) ([place])   . Gout   . Hypercholesteremia   . Hypertension   . Iron deficiency anemia   . Leukopenia   . Stroke [facility]) 2005   Medications:  Prescriptions Prior to Admission  Medication Sig Dispense Refill Last Dose  . aspirin EC 81 MG tablet Take 81 mg by mouth daily.   01/24/2017 at Unknown time  . baclofen (LIORESAL) 10 MG tablet Take 10 mg by mouth daily.   Past Week at Unknown time  . Multiple Vitamin (MULTIVITAMIN) tablet Take 1 tablet by mouth daily.   01/23/2017 at Unknown time  . traMADol (ULTRAM) 50 MG tablet Take 50 mg by mouth 2 (two) times daily.   Past Week at Unknown time  . calcitRIOL (ROCALTROL) 0.25 MCG capsule Take 0.25 mcg by mouth daily. Reported on 02/21/2016   Not Taking at Unknown time   Assessment: 83 yo female ESRD on dialysis.  New onset atrial fibrillation. Labs reviewed PTA medications reviewed Heparin level is supratherapeutic. INR  increase to 2.31. INR is now rising rapidly.  HCT/HGB is relatively stable.  Patient was able to keep pills down yesterday and no nausea reported.   Goal of Therapy:  Heparin level 0.3-0.7 units/ml Monitor platelets by anticoagulation protocol: Yes   Plan:  Decrease Heparin infusion to 400 units/hour (anticipate d/c soon) Heparin level in 6-8 hours and daily Coumadin 2.5 mg po x 1 dose today. INR/PT daily Monitor CBC, platelets, signs of bleeding  Hart Robinsons, PharmD Clinical Pharmacist Pager:  [phone] 01/28/2017   01/28/2017,8:45 AM

## 2017-01-28 NOTE — Progress Notes (Signed)
Destiny Boyd  MRN: [id number]  DOB/AGE: 22-Dec-1933 83 y.o.  Primary Care Physician:Fanta, Tesfaye, MD  Admit date: 01/24/2017  Chief Complaint:  Chief Complaint  Patient presents with  . Atrial Fibrillation    S-Pt presented on  01/24/2017 with  Chief Complaint  Patient presents with  . Atrial Fibrillation  .    Pt offers no new new complaints.   Meds . aspirin EC  81 mg Oral Daily  . baclofen  10 mg Oral Daily  . calcitRIOL  0.25 mcg Oral Daily  . diltiazem  60 mg Oral Q6H  . insulin aspart  0-9 Units Subcutaneous TID WC  . off the beat book   Does not apply Once  . ondansetron  4 mg Oral TID AC  . pantoprazole  40 mg Oral BID AC  . sevelamer carbonate  1,600 mg Oral TID WC  . sevelamer carbonate  800 mg Oral With snacks  . sodium chloride flush  3 mL Intravenous Q12H  . warfarin  2.5 mg Oral Once  . Warfarin - Pharmacist Dosing Inpatient   Does not apply Q24H      Physical Exam: Vital signs in last 24 hours: Temp:  [98.4 F (36.9 C)-98.9 F (37.2 C)] 98.4 F (36.9 C) (06/13 0712) Pulse Rate:  [30-162] 81 (06/13 1445) Resp:  [8-27] 17 (06/13 1445) BP: (82-183)/(53-106) 151/67 (06/13 1445) SpO2:  [83 %-100 %] 100 % (06/13 1400) Weight:  [119 lb 14.9 oz (54.4 kg)] 119 lb 14.9 oz (54.4 kg) (06/13 1400) Weight change:  Last BM Date: 01/24/17  Intake/Output from previous day: 06/12 0701 - 06/13 0700 In: 868.8 [P.O.:720; I.V.:148.8] Out: -  No intake/output data recorded.   Physical Exam: General- pt is awake,but not  Oriented. Resp- No acute REsp distress,  NO Rhonchi CVS- S1S2 regular ij rate and rhythm GIT- BS+, soft, NT, ND EXT- NO LE Edema, Cyanosis Access- left AVF  Lab Results: CBC  Recent Labs  01/27/17 0520 01/28/17 0526  WBC 4.2 3.3*  HGB 11.7* 9.6*  HCT 35.6* 29.4*  PLT 232 189    BMET  Recent Labs  01/27/17 0520 01/28/17 0526  NA 133* 127*  K 4.1 4.5  CL 92* 88*  CO2 27 25  GLUCOSE 137* 146*  BUN 19 27*  CREATININE  5.96* 7.12*  CALCIUM 10.0 9.5    MICRO Recent Results (from the past 240 hour(s))  MRSA PCR Screening     Status: None   Collection Time: 01/24/17  2:13 PM  Result Value Ref Range Status   MRSA by PCR NEGATIVE NEGATIVE Final    Comment:        The GeneXpert MRSA Assay (FDA approved for NASAL specimens only), is one component of a comprehensive MRSA colonization surveillance program. It is not intended to diagnose MRSA infection nor to guide or monitor treatment for MRSA infections.       Lab Results  Component Value Date   CALCIUM 9.5 01/28/2017   CAION 1.18 03/03/2016   PHOS 7.5 (H) 01/28/2017               Impression: 1)Renal  ESRD on HD                Pt on HD -Monday/Wednesday/Friday                Pt to be dialyzed today  2)HTN  Medication-  On Calcium Channel Blockers   3)Anemia In ESRD the goal for Hgb is  9--11 Will keep On epo   4)CKD Mineral-Bone Disorder  Secondary Hyperparathyroidism  Present On calcitriol Phosphorus not  at goal.   On binders   5)CVS-admitted with Afib with RVR On Diltiazem Primary  MD following  6)Electrolytes  Normokalemic  Hyponatremic     Sec to ESRD  7)Acid base Co2 at goal     Plan:  Will dialyze today  Addendum Pt seen on HD. Pt tolerating tx well.      Mechell Girgis S 01/28/2017, 2:52 PM

## 2017-01-28 NOTE — Progress Notes (Addendum)
Progress Note  Patient Name: Destiny Boyd Date of Encounter: 01/28/2017  Primary Cardiologist: Dr. Bronson Ing  Subjective   Restless night. IV diltiazem restarted. Difficult awakening this am b/c of sleeping tablet.  Inpatient Medications    Scheduled Meds: . aspirin EC  81 mg Oral Daily  . baclofen  10 mg Oral Daily  . calcitRIOL  0.25 mcg Oral Daily  . diltiazem  30 mg Oral Q6H  . insulin aspart  0-9 Units Subcutaneous TID WC  . off the beat book   Does not apply Once  . ondansetron  4 mg Oral TID AC  . pantoprazole  40 mg Oral BID AC  . sevelamer carbonate  1,600 mg Oral TID WC  . sevelamer carbonate  800 mg Oral With snacks  . sodium chloride flush  3 mL Intravenous Q12H  . Warfarin - Pharmacist Dosing Inpatient   Does not apply Q24H   Continuous Infusions: . sodium chloride    . sodium chloride    . diltiazem (CARDIZEM) infusion 5 mg/hr (01/28/17 0326)  . heparin 500 Units/hr (01/27/17 1655)   PRN Meds: sodium chloride, sodium chloride, acetaminophen **OR** acetaminophen, lidocaine (PF), lidocaine-prilocaine, ondansetron (ZOFRAN) IV, pentafluoroprop-tetrafluoroeth, traMADol, zolpidem   Vital Signs    Vitals:   01/28/17 0400 01/28/17 0500 01/28/17 0625 01/28/17 0712  BP: 127/81 131/72 103/61   Pulse: 61 100 65 (!) 30  Resp: 11 12 15 17   Temp: 98.7 F (37.1 C)   98.4 F (36.9 C)  TempSrc: Oral   Axillary  SpO2: 99% 99% 100% 100%  Weight:      Height:        Intake/Output Summary (Last 24 hours) at 01/28/17 0752 Last data filed at 01/28/17 0400  Gross per 24 hour  Intake            868.8 ml  Output                0 ml  Net            868.8 ml   Filed Weights   01/24/17 1311 01/25/17 0500 01/26/17 1525  Weight: 116 lb 6.5 oz (52.8 kg) 122 lb 12.7 oz (55.7 kg) 124 lb 9 oz (56.5 kg)    Telemetry    Afib at 76/m- Personally Reviewed  ECG      Physical Exam    GEN: Sleepy, lethargicNo acute distress.   Neck: No JVD Cardiac: Irreg irreg  2-3/6 sys murmur LSB and apex.  Respiratory: Clear to auscultation bilaterally. GI: Soft, nontender, non-distended  MS: No edema; No deformity. Neuro:  Nonfocal  Psych: Normal affect   Labs    Chemistry Recent Labs Lab 01/26/17 0541 01/27/17 0520 01/28/17 0526  NA 134* 133* 127*  K 4.2 4.1 4.5  CL 93* 92* 88*  CO2 26 27 25   GLUCOSE 123* 137* 146*  BUN 36* 19 27*  CREATININE 8.63* 5.96* 7.12*  CALCIUM 9.5 10.0 9.5  ALBUMIN  --  3.3* 2.8*  GFRNONAA 4* 6* 5*  GFRAA 4* 7* 6*  ANIONGAP 15 14 14      Hematology Recent Labs Lab 01/26/17 0541 01/27/17 0520 01/28/17 0526  WBC 3.8* 4.2 3.3*  RBC 3.89 4.06 3.40*  HGB 10.7* 11.7* 9.6*  HCT 33.8* 35.6* 29.4*  MCV 86.9 87.7 86.5  MCH 27.5 28.8 28.2  MCHC 31.7 32.9 32.7  RDW 14.2 14.2 13.2  PLT 224 232 189    Cardiac Enzymes Recent Labs Lab 01/24/17 0755 01/24/17  1109  TROPONINI 0.09* 0.08*   No results for input(s): TROPIPOC in the last 168 hours.   BNPNo results for input(s): BNP, PROBNP in the last 168 hours.   DDimer No results for input(s): DDIMER in the last 168 hours.   Radiology    No results found.  Cardiac Studies   Echocardiogram 01/24/2017 Left ventricle: Wall thickness was increased in a pattern of mild   LVH. Systolic function was normal. The estimated ejection   fraction was in the range of 55% to 60%. Left ventricular   diastolic function parameters were normal. - Mitral valve: Severely calcified, severely thickened annulus.   Moderately thickened, moderately calcified leaflets . There was   mild regurgitation. - Left atrium: The atrium was mildly dilated. - Atrial septum: No defect or patent foramen ovale was identified. - Pulmonary arteries: PA peak pressure: 35 mm Hg (S).   Patient Profile     83 y.o. female  83 y.o. female with a hx of ESRD on dialysis, diabetes, CVA, (not embolic) and colon cancer,  who is being seen today for the evaluation of new onset atrial fib at the request of  Dr. Legrand Rams. No prior history of cardiac disease or cardiac evaluation in the past.     Assessment & Plan    1. New Onset Atrial fib/flutter: Marland KitchenIV cardizem restarted last night b/c afib 120-140/m. Now HR in 70's at rest. Try to transition to oral again. Increase diltiazem to 60 mg q 6 hrs and wean drip. On heparin/Coumadin. Not candidate for DOAC in the setting of valvular heart disease. CHADS VASC Score of 7.     2. Hypertension: BP better today.   3. Mitral Valve disease: Severely thickened and calcified annulus.     4. End-stage Renal Disease: Now on dialysis per Dr. Hinda Lenis.   5. Hx of CVA: No focal weakness. Continue ASA     6. Hyponatremia for dialysis today. Weight continues to trend up.     Signed, Ermalinda Barrios, PA-C  01/28/2017, 7:52 AM    The patient was seen and examined, and I agree with the history, physical exam, assessment and plan as documented above, with modifications as noted below.  Pt awake now eating breakfast but still lethargic. HR's in 95-105 bpm range. Will increase oral diltiazem to 60 mg q 6 hours and wean off infusion. Continue IV heparin and warfarin for anticoagulation. INR 2.31 today (1.37 yesterday).  BP normal at present.  Na 127 today, indicative of hypervolemic hyponatremia. Going for dialysis today.   Kate Sable, MD, [facility]  01/28/2017 8:26 AM

## 2017-01-28 NOTE — Progress Notes (Signed)
Assessed fistula site and noticed gauze dressing over top of fistula had been saturated with blood. Removed dressing to assess and noticed site was still bleeding. Redressed site with new gauze and tape. Will continue to monitor bleeding.

## 2017-01-28 NOTE — Progress Notes (Signed)
Patient woke up from sleep and heart rate increased to 120s-140s. Gave PRN dose 5 mg of Ambien PO to help patient relax and sleep. Patient did not go back to sleep, heart rate unchanged. Called Dr. Anastasio Champion, given verbal orders to restart Cardizem drip as previous order. Orders placed and completed

## 2017-01-29 LAB — GLUCOSE, CAPILLARY
GLUCOSE-CAPILLARY: 174 mg/dL — AB (ref 65–99)
Glucose-Capillary: 114 mg/dL — ABNORMAL HIGH (ref 65–99)
Glucose-Capillary: 111 mg/dL — ABNORMAL HIGH (ref 65–99)
Glucose-Capillary: 121 mg/dL — ABNORMAL HIGH (ref 65–99)

## 2017-01-29 LAB — PROTIME-INR
INR: 3.16
Prothrombin Time: 33.1 seconds — ABNORMAL HIGH (ref 11.4–15.2)

## 2017-01-29 LAB — CBC
HEMATOCRIT: 33.2 % — AB (ref 36.0–46.0)
HEMOGLOBIN: 10.6 g/dL — AB (ref 12.0–15.0)
MCH: 27.6 pg (ref 26.0–34.0)
MCHC: 31.9 g/dL (ref 30.0–36.0)
MCV: 86.5 fL (ref 78.0–100.0)
Platelets: 195 10*3/uL (ref 150–400)
RBC: 3.84 MIL/uL — AB (ref 3.87–5.11)
RDW: 14.1 % (ref 11.5–15.5)
WBC: 3.7 10*3/uL — ABNORMAL LOW (ref 4.0–10.5)

## 2017-01-29 MED ORDER — DILTIAZEM HCL ER COATED BEADS 120 MG PO CP24
120.0000 mg | ORAL_CAPSULE | Freq: Two times a day (BID) | ORAL | Status: DC
  Administered 2017-01-29 – 2017-01-30 (×3): 120 mg via ORAL
  Filled 2017-01-29 (×3): qty 1

## 2017-01-29 NOTE — Progress Notes (Signed)
PT Cancellation Note  Patient Details Name: Destiny Boyd MRN: [id number] DOB: [birth year]   Cancelled Treatment:    Reason Eval/Treat Not Completed: Other (comment) (Family asked PT not to wake her).  Try again tomorrow as pt and family will allow.   Ramond Dial 01/29/2017, 4:13 PM   Mee Hives, PT MS Acute Rehab Dept. Number: [place]

## 2017-01-29 NOTE — Progress Notes (Signed)
LCSW asked to speak to family regarding questions. Attempted to meet in the ICU however patient was moved to unit. Went by room and daughter was not in room, but family reports she went to work and LCSW could call and discuss questions.  Message left for daughter [phone].  Family had questions regarding patient's discharge and when she will leave.  LCSW deferred to nursing or attending MD. Family questioned about medical bill and payment in which LCSW also educated about financial counselors.  LCSW educated family and patient regarding role in hospital and assistance with dc planning, community resources and psycho-social issues.  Message left for daughter to address any needs.   Lane Hacker, MSW Clinical Social Work: [employer] Coverage for :  [phone]

## 2017-01-29 NOTE — Progress Notes (Signed)
ANTICOAGULATION CONSULT NOTE -   Pharmacy Consult for Coumadin Indication: atrial fibrillation  No Known Allergies  Patient Measurements: Height: 5\' 4"  (162.6 cm) Weight: 119 lb 7.8 oz (54.2 kg) IBW/kg (Calculated) : 54.7 Heparin Dosing Weight: 52.8 kg  Vital Signs: Temp: 98 F (36.7 C) (06/14 0731) Temp Source: Oral (06/14 0731) BP: 131/68 (06/14 0700) Pulse Rate: 87 (06/14 0700)  Labs:  Recent Labs  01/27/17 0520 01/27/17 1544 01/28/17 0118 01/28/17 0526 01/29/17 0516  HGB 11.7*  --   --  9.6* 10.6*  HCT 35.6*  --   --  29.4* 33.2*  PLT 232  --   --  189 195  LABPROT 16.9*  --   --  25.8* 33.1*  INR 1.37  --   --  2.31 3.16  HEPARINUNFRC 1.18* 0.87* 0.79*  --   --   CREATININE 5.96*  --   --  7.12*  --    Estimated Creatinine Clearance: 5.4 mL/min (A) (by C-G formula based on SCr of 7.12 mg/dL (H)).  Medical History: Past Medical History:  Diagnosis Date  . 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 Colon cancer ([place])    cecum cancer  . Diabetes mellitus   . ESRD (end stage renal disease) ([place])   . Gout   . Hypercholesteremia   . Hypertension   . Iron deficiency anemia   . Leukopenia   . Stroke [facility] 2005   Medications:  Prescriptions Prior to Admission  Medication Sig Dispense Refill Last Dose  . aspirin EC 81 MG tablet Take 81 mg by mouth daily.   01/24/2017 at Unknown time  . baclofen (LIORESAL) 10 MG tablet Take 10 mg by mouth daily.   Past Week at Unknown time  . Multiple Vitamin (MULTIVITAMIN) tablet Take 1 tablet by mouth daily.   01/23/2017 at Unknown time  . traMADol (ULTRAM) 50 MG tablet Take 50 mg by mouth 2 (two) times daily.   Past Week at Unknown time  . calcitRIOL (ROCALTROL) 0.25 MCG capsule Take 0.25 mcg by mouth daily. Reported on 02/21/2016   Not Taking at Unknown time   Assessment: 83 yo female ESRD on dialysis.  New onset atrial fibrillation. Labs reviewed PTA medications reviewed INR increase to 3.16. INR is now rising rapidly.  HCT/HGB is  relatively stable.  Patient was able to keep pills down yesterday and no nausea reported. Heparin discontinued.   Goal of Therapy:  Heparin level 0.3-0.7 units/ml Monitor platelets by anticoagulation protocol: Yes   Plan:  No coumadin  today. INR/PT daily Monitor CBC, platelets, signs of bleeding  Isac Sarna, BS Vena [place], BCPS Clinical Pharmacist Pager [phone] 01/29/2017,8:07 AM

## 2017-01-29 NOTE — Progress Notes (Signed)
Progress Note  Patient Name: Destiny Boyd Date of Encounter: 01/29/2017  Primary Cardiologist: Bronson Ing  Subjective  No complaints of chest pain or nausea and vomiting.    Inpatient Medications    Scheduled Meds: . aspirin EC  81 mg Oral Daily  . baclofen  10 mg Oral Daily  . calcitRIOL  0.25 mcg Oral Daily  . diltiazem  60 mg Oral Q6H  . insulin aspart  0-9 Units Subcutaneous TID WC  . off the beat book   Does not apply Once  . ondansetron  4 mg Oral TID AC  . pantoprazole  40 mg Oral BID AC  . sevelamer carbonate  1,600 mg Oral TID WC  . sevelamer carbonate  800 mg Oral With snacks  . sodium chloride flush  3 mL Intravenous Q12H  . Warfarin - Pharmacist Dosing Inpatient   Does not apply Q24H   Continuous Infusions: . sodium chloride    . sodium chloride     PRN Meds: sodium chloride, sodium chloride, acetaminophen **OR** acetaminophen, lidocaine (PF), lidocaine-prilocaine, ondansetron (ZOFRAN) IV, pentafluoroprop-tetrafluoroeth, traMADol, zolpidem   Vital Signs    Vitals:   01/29/17 0500 01/29/17 0600 01/29/17 0700 01/29/17 0731  BP: 111/85 120/63 131/68   Pulse: (!) 54 91 87   Resp: 16 13  14   Temp:    98 F (36.7 C)  TempSrc:    Oral  SpO2: 92% 100%    Weight: 119 lb 7.8 oz (54.2 kg)     Height:        Intake/Output Summary (Last 24 hours) at 01/29/17 0749 Last data filed at 01/28/17 2100  Gross per 24 hour  Intake              240 ml  Output             2000 ml  Net            -1760 ml   Filed Weights   01/26/17 1525 01/28/17 1400 01/29/17 0500  Weight: 124 lb 9 oz (56.5 kg) 119 lb 14.9 oz (54.4 kg) 119 lb 7.8 oz (54.2 kg)    Telemetry    Atrial fib rates in the 70's. - Personally Reviewed  Physical Exam   GEN: No acute distress.   Neck: No JVD Cardiac: IRRR, 2/6 systolic murmur heard best at the apex and LSB, no rubs, or gallops.  Respiratory: Clear to auscultation bilaterally. GI: Soft, nontender, non-distended  MS: No edema; No  deformity. AV fistula left arm. Neuro:  Nonfocal  Psych: Normal affect   Labs    Chemistry Recent Labs Lab 01/26/17 0541 01/27/17 0520 01/28/17 0526  NA 134* 133* 127*  K 4.2 4.1 4.5  CL 93* 92* 88*  CO2 26 27 25   GLUCOSE 123* 137* 146*  BUN 36* 19 27*  CREATININE 8.63* 5.96* 7.12*  CALCIUM 9.5 10.0 9.5  ALBUMIN  --  3.3* 2.8*  GFRNONAA 4* 6* 5*  GFRAA 4* 7* 6*  ANIONGAP 15 14 14      Hematology Recent Labs Lab 01/27/17 0520 01/28/17 0526 01/29/17 0516  WBC 4.2 3.3* 3.7*  RBC 4.06 3.40* 3.84*  HGB 11.7* 9.6* 10.6*  HCT 35.6* 29.4* 33.2*  MCV 87.7 86.5 86.5  MCH 28.8 28.2 27.6  MCHC 32.9 32.7 31.9  RDW 14.2 13.2 14.1  PLT 232 189 195    Cardiac Enzymes Recent Labs Lab 01/24/17 0755 01/24/17 1109  TROPONINI 0.09* 0.08*    Radiology  No results found.  Cardiac Studies  Echocardiogram 01/24/2017 Left ventricle: Wall thickness was increased in a pattern of mild LVH. Systolic function was normal. The estimated ejection fraction was in the range of 55% to 60%. Left ventricular diastolic function parameters were normal. - Mitral valve: Severely calcified, severely thickened annulus. Moderately thickened, moderately calcified leaflets . There was mild regurgitation. - Left atrium: The atrium was mildly dilated. - Atrial septum: No defect or patent foramen ovale was identified. - Pulmonary arteries: PA peak pressure: 35 mm Hg (S).   Patient Profile     83 y.o. female with a hx of ESRD on dialysis, diabetes, CVA, (not embolic) and colon cancer, who is being seen today for the evaluation of new onset atrial fibat the request of Dr. Legrand Rams. No prior history of cardiac disease or cardiac evaluation in the past.   Assessment & Plan    1. Afib with RVR: Rate is now controlled on increased dose of diltiazem at 60 mg Q 6 hours. BP is stable. She continues on coumadin therapy.   Will need to decide who will follow this as OP. INR 3.16. Discussed  this with Dr. Hinda Lenis, nephrology does not follow or dose coumadin, Dr. Josephine Cables office does not do this either. Will need to set her up in our [place] office coumadin clinic.  She will have HHN at home initially, and they can check INR at her daughter's home where she will be staying at first. Orders on discharge will need to reflect that [facility] can check PT/INR.   Office visits will need to be made for follow up afterwards in coumadin clinic. Will also make post hospital follow up with cardiology.   2. Hypertension: BP controlled.   3. ESRD: Followed by nephrology  4. Anemia: Likely from CKD.   5. Mitral Valve disease: Medical management of severely thickened and calcified annulus.   Signed, Phill Myron. [place] Pugh, ANP, AACC   01/29/2017, 7:49 AM    The patient was seen and examined, and I agree with the history, physical exam, assessment and plan as documented above, with modifications as noted below.  Pt is doing well today and denies chest pain, palpitations, and shortness of breath. Dialyzed yesterday. Na is pending.  HR controlled on short-acting diltiazem. I will switch to long-acting 120 mg bid for feasibility of administration. BP elevated during my exam, normal earlier. Anticoagulated with warfarin, INR mildly supratherapeutic today at 3.16.  Follow up arranged as above. No further recommendations. Will sign off.   Kate Sable, MD, [facility]  01/29/2017 9:30 AM

## 2017-01-29 NOTE — Progress Notes (Signed)
Subjective: Patient was confused yesterday, but better today. Her Po intake is improving. No nausea or vomiting. Objective: Vital signs in last 24 hours: Temp:  [98 F (36.7 C)-98.7 F (37.1 C)] 98 F (36.7 C) (06/14 0731) Pulse Rate:  [33-162] 87 (06/14 0700) Resp:  [8-22] 14 (06/14 0731) BP: (82-163)/(53-105) 131/68 (06/14 0700) SpO2:  [81 %-100 %] 100 % (06/14 0600) Weight:  [54.2 kg (119 lb 7.8 oz)-54.4 kg (119 lb 14.9 oz)] 54.2 kg (119 lb 7.8 oz) (06/14 0500) Weight change:  Last BM Date: 01/24/17  Intake/Output from previous day: 06/13 0701 - 06/14 0700 In: 240 [P.O.:240] Out: 2000   PHYSICAL EXAM General appearance: alert, cooperative and no distress Resp: clear to auscultation bilaterally Cardio: irregularly irregular rhythm GI: soft, non-tender; bowel sounds normal; no masses,  no organomegaly Extremities: extremities normal, atraumatic, no cyanosis or edema  Lab Results:  Results for orders placed or performed during the hospital encounter of 01/24/17 (from the past 48 hour(s))  Glucose, capillary     Status: Abnormal   Collection Time: 01/27/17 11:45 AM  Result Value Ref Range   Glucose-Capillary 135 (H) 65 - 99 mg/dL  Heparin level (unfractionated)     Status: Abnormal   Collection Time: 01/27/17  3:44 PM  Result Value Ref Range   Heparin Unfractionated 0.87 (H) 0.30 - 0.70 IU/mL    Comment:        IF HEPARIN RESULTS ARE BELOW EXPECTED VALUES, AND PATIENT DOSAGE HAS BEEN CONFIRMED, SUGGEST FOLLOW UP TESTING OF ANTITHROMBIN III LEVELS.   Glucose, capillary     Status: Abnormal   Collection Time: 01/27/17  4:52 PM  Result Value Ref Range   Glucose-Capillary 110 (H) 65 - 99 mg/dL  Glucose, capillary     Status: Abnormal   Collection Time: 01/27/17  9:34 PM  Result Value Ref Range   Glucose-Capillary 129 (H) 65 - 99 mg/dL  Heparin level (unfractionated)     Status: Abnormal   Collection Time: 01/28/17  1:18 AM  Result Value Ref Range   Heparin  Unfractionated 0.79 (H) 0.30 - 0.70 IU/mL    Comment:        IF HEPARIN RESULTS ARE BELOW EXPECTED VALUES, AND PATIENT DOSAGE HAS BEEN CONFIRMED, SUGGEST FOLLOW UP TESTING OF ANTITHROMBIN III LEVELS.   CBC     Status: Abnormal   Collection Time: 01/28/17  5:26 AM  Result Value Ref Range   WBC 3.3 (L) 4.0 - 10.5 K/uL   RBC 3.40 (L) 3.87 - 5.11 MIL/uL   Hemoglobin 9.6 (L) 12.0 - 15.0 g/dL   HCT 29.4 (L) 36.0 - 46.0 %   MCV 86.5 78.0 - 100.0 fL   MCH 28.2 26.0 - 34.0 pg   MCHC 32.7 30.0 - 36.0 g/dL   RDW 13.2 11.5 - 15.5 %   Platelets 189 150 - 400 K/uL  Protime-INR     Status: Abnormal   Collection Time: 01/28/17  5:26 AM  Result Value Ref Range   Prothrombin Time 25.8 (H) 11.4 - 15.2 seconds   INR 2.31   Renal function panel     Status: Abnormal   Collection Time: 01/28/17  5:26 AM  Result Value Ref Range   Sodium 127 (L) 135 - 145 mmol/L   Potassium 4.5 3.5 - 5.1 mmol/L   Chloride 88 (L) 101 - 111 mmol/L   CO2 25 22 - 32 mmol/L   Glucose, Bld 146 (H) 65 - 99 mg/dL   BUN 27 (H) 6 -  20 mg/dL   Creatinine, Ser 7.12 (H) 0.44 - 1.00 mg/dL   Calcium 9.5 8.9 - 10.3 mg/dL   Phosphorus 7.5 (H) 2.5 - 4.6 mg/dL   Albumin 2.8 (L) 3.5 - 5.0 g/dL   GFR calc non Af Amer 5 (L) >60 mL/min   GFR calc Af Amer 6 (L) >60 mL/min    Comment: (NOTE) The eGFR has been calculated using the CKD EPI equation. This calculation has not been validated in all clinical situations. eGFR's persistently <60 mL/min signify possible Chronic Kidney Disease.    Anion gap 14 5 - 15  Glucose, capillary     Status: Abnormal   Collection Time: 01/28/17  7:11 AM  Result Value Ref Range   Glucose-Capillary 122 (H) 65 - 99 mg/dL  Glucose, capillary     Status: Abnormal   Collection Time: 01/28/17 11:40 AM  Result Value Ref Range   Glucose-Capillary 120 (H) 65 - 99 mg/dL  Glucose, capillary     Status: Abnormal   Collection Time: 01/28/17  4:08 PM  Result Value Ref Range   Glucose-Capillary 106 (H) 65 - 99  mg/dL  Glucose, capillary     Status: Abnormal   Collection Time: 01/28/17  9:01 PM  Result Value Ref Range   Glucose-Capillary 181 (H) 65 - 99 mg/dL   Comment 1 Notify RN   CBC     Status: Abnormal   Collection Time: 01/29/17  5:16 AM  Result Value Ref Range   WBC 3.7 (L) 4.0 - 10.5 K/uL   RBC 3.84 (L) 3.87 - 5.11 MIL/uL   Hemoglobin 10.6 (L) 12.0 - 15.0 g/dL   HCT 33.2 (L) 36.0 - 46.0 %   MCV 86.5 78.0 - 100.0 fL   MCH 27.6 26.0 - 34.0 pg   MCHC 31.9 30.0 - 36.0 g/dL   RDW 14.1 11.5 - 15.5 %   Platelets 195 150 - 400 K/uL  Protime-INR     Status: Abnormal   Collection Time: 01/29/17  5:16 AM  Result Value Ref Range   Prothrombin Time 33.1 (H) 11.4 - 15.2 seconds   INR 3.16   Glucose, capillary     Status: Abnormal   Collection Time: 01/29/17  7:30 AM  Result Value Ref Range   Glucose-Capillary 114 (H) 65 - 99 mg/dL    ABGS No results for input(s): PHART, PO2ART, TCO2, HCO3 in the last 72 hours.  Invalid input(s): PCO2 CULTURES Recent Results (from the past 240 hour(s))  MRSA PCR Screening     Status: None   Collection Time: 01/24/17  2:13 PM  Result Value Ref Range Status   MRSA by PCR NEGATIVE NEGATIVE Final    Comment:        The GeneXpert MRSA Assay (FDA approved for NASAL specimens only), is one component of a comprehensive MRSA colonization surveillance program. It is not intended to diagnose MRSA infection nor to guide or monitor treatment for MRSA infections.    Studies/Results: No results found.  Medications: I have reviewed the patient's current medications.  Assesment:  Principal Problem:   Atrial fibrillation with RVR (HCC) Active Problems:   Essential hypertension   ESRD on dialysis (HCC)   Elevated troponin   DM type 2 (diabetes mellitus, type 2) (HCC)   Aortic atherosclerosis (HCC)   Nausea and vomiting   Anemia   Demand ischemia of myocardium ([place])   Encounter for anticoagulation discussion and counseling    Nausea/Vomioting   Plan:  Medications reviewed Continue telemetry  Physical therapy Continue hemodialysis Continue coumadin and will continue to monitor INR  LOS: 4 days   Holleigh Crihfield 01/29/2017, 7:57 AM

## 2017-01-29 NOTE — Care Management Note (Signed)
Case Management Note  Patient Details  Name: BAELYNN SCHMUHL MRN: [id number] Date of Birth: [birth year]   Expected Discharge Date:  01/29/17               Expected Discharge Plan:  [place]  In-House Referral:     Discharge planning Services  CM Consult  Post Acute Care Choice:  Durable Medical Equipment, Home Health Choice offered to:  Patient, Adult Children  DME Arranged:  [facility] DME Agency:  [place] Arranged:  RN, PT [facility] Agency:  [place]  Status of Service:  In process, will continue to follow  If discussed at Long Length of Stay Meetings, dates discussed:    Additional Comments:  01/29/2017  Kiele Heavrin, Chauncey [place], RN 01/29/2017, 10:23 AM

## 2017-01-29 NOTE — Progress Notes (Signed)
Subjective: Interval History: Patient offers no complaints. Her appetite is good and no difficulty breathing.  Objective: Vital signs in last 24 hours: Temp:  [98 F (36.7 C)-98.7 F (37.1 C)] 98 F (36.7 C) (06/14 0731) Pulse Rate:  [33-162] 87 (06/14 0700) Resp:  [8-22] 14 (06/14 0731) BP: (82-163)/(53-105) 131/68 (06/14 0700) SpO2:  [81 %-100 %] 100 % (06/14 0600) Weight:  [54.2 kg (119 lb 7.8 oz)-54.4 kg (119 lb 14.9 oz)] 54.2 kg (119 lb 7.8 oz) (06/14 0500) Weight change:   Intake/Output from previous day: 06/13 0701 - 06/14 0700 In: 240 [P.O.:240] Out: 2000  Intake/Output this shift: No intake/output data recorded.  General appearance: alert, cooperative and no distress Resp: clear to auscultation bilaterally Cardio: irregularly irregular rhythm Extremities: extremities normal, atraumatic, no cyanosis or edema  Lab Results:  Recent Labs  01/28/17 0526 01/29/17 0516  WBC 3.3* 3.7*  HGB 9.6* 10.6*  HCT 29.4* 33.2*  PLT 189 195   BMET:   Recent Labs  01/27/17 0520 01/28/17 0526  NA 133* 127*  K 4.1 4.5  CL 92* 88*  CO2 27 25  GLUCOSE 137* 146*  BUN 19 27*  CREATININE 5.96* 7.12*  CALCIUM 10.0 9.5   No results for input(s): PTH in the last 72 hours. Iron Studies:   Recent Labs  01/26/17 0927  IRON 121  TIBC 308  FERRITIN 982*    Studies/Results: No results found.  I have reviewed the patient's current medications.  Assessment/Plan: Problem #1  atrial fibrillation: Presently she is on Cardizem and her heart rate is controlled. Problem #2 end-stage renal disease: She is status post hemodialysis yesterday.Patient doesn't have any uremic signs and symptoms. Her potassium predialysis was 4.5 and normal. Problem #3 fluid management: Were able to remove about 2 L yesterday with dialysis. Patient at this moment doesn't have any sign of fluid overload Problem #4 anemia: Her hemoglobin is within our target goal. She is on Epogen. Problem #5  hypertension: Her blood pressure is reasonably controlled Problem #6 bone and metabolic disorder: Her calcium is in range. Phosphorus is high. Presently she is on Renvela. Problem #7 diabetes: Her blood sugar is reasonably controlled. Plan: 1] patient doesn't need dialysis today 2 We'll make arrangements for patient to get dialysis tomorrow  3] will check CBC and renal panel in the morning.    LOS: 4 days   Destiny Boyd S 01/29/2017,8:12 AM

## 2017-01-29 NOTE — Progress Notes (Signed)
Patient being transferred to Dept 300,room 340. Report called, and given to [employer]. Family at bedside. Vital signs stable.No co pain or discomfort noted. Staff accompanied patient to awaiting floor.

## 2017-01-30 LAB — GLUCOSE, CAPILLARY
Glucose-Capillary: 121 mg/dL — ABNORMAL HIGH (ref 65–99)
Glucose-Capillary: 95 mg/dL (ref 65–99)

## 2017-01-30 LAB — RENAL FUNCTION PANEL
ANION GAP: 13 (ref 5–15)
Albumin: 2.9 g/dL — ABNORMAL LOW (ref 3.5–5.0)
BUN: 35 mg/dL — ABNORMAL HIGH (ref 6–20)
CHLORIDE: 88 mmol/L — AB (ref 101–111)
CO2: 26 mmol/L (ref 22–32)
Calcium: 9.6 mg/dL (ref 8.9–10.3)
Creatinine, Ser: 6.15 mg/dL — ABNORMAL HIGH (ref 0.44–1.00)
GFR calc Af Amer: 7 mL/min — ABNORMAL LOW (ref >60)
GFR calc non Af Amer: 6 mL/min — ABNORMAL LOW (ref >60)
GLUCOSE: 135 mg/dL — AB (ref 65–99)
POTASSIUM: 4.3 mmol/L (ref 3.5–5.1)
Phosphorus: 5.4 mg/dL — ABNORMAL HIGH (ref 2.5–4.6)
Sodium: 127 mmol/L — ABNORMAL LOW (ref 135–145)

## 2017-01-30 LAB — PROTIME-INR
INR: 3.3
Prothrombin Time: 34.3 seconds — ABNORMAL HIGH (ref 11.4–15.2)

## 2017-01-30 MED ORDER — PANTOPRAZOLE SODIUM 40 MG PO TBEC: 40.0000 mg | DELAYED_RELEASE_TABLET | Freq: Every day | ORAL | 3 refills | Status: DC

## 2017-01-30 MED ORDER — SODIUM CHLORIDE 0.9 % IV SOLN: 100.0000 mL | INTRAVENOUS | Status: DC | PRN

## 2017-01-30 MED ORDER — ALTEPLASE 2 MG IJ SOLR
2.0000 mg | Freq: Once | INTRAMUSCULAR | Status: DC | PRN
  Filled 2017-01-30: qty 2

## 2017-01-30 MED ORDER — DILTIAZEM HCL ER COATED BEADS 120 MG PO CP24: 120.0000 mg | ORAL_CAPSULE | Freq: Two times a day (BID) | ORAL | 3 refills | Status: DC

## 2017-01-30 MED ORDER — WARFARIN SODIUM 2.5 MG PO TABS: 2.5000 mg | ORAL_TABLET | Freq: Every day | ORAL | 11 refills | Status: DC

## 2017-01-30 MED ORDER — PANTOPRAZOLE SODIUM 40 MG PO TBEC: 40.0000 mg | DELAYED_RELEASE_TABLET | Freq: Every day | ORAL | 3 refills | Status: AC

## 2017-01-30 MED ORDER — HEPARIN SODIUM (PORCINE) 1000 UNIT/ML DIALYSIS
1000.0000 [IU] | INTRAMUSCULAR | Status: DC | PRN
  Filled 2017-01-30: qty 1

## 2017-01-30 NOTE — Care Management Note (Signed)
Case Management Note  Patient Details  Name: JENDAYA GOSSETT MRN: [id number] Date of Birth: 17-Jan-1934  Expected Discharge Date:  01/30/17               Expected Discharge Plan:  [place] Discharge planning Services  CM Consult  Post Acute Care Choice:  Durable Medical Equipment, Home Health Choice offered to:  Patient, Adult Children  DME Arranged:  [facility] DME Agency:  [place] Arranged:  RN, PT [facility] Agency:  [place]  Status of Service:  Completed, signed off   Additional Comments: Pt discharging home today with [place] nursing/PT through [facility]. Pt/daughter made aware that [facility] has 48hrs to make first visit. CM spoke with pt's daughter over the phone. She is interested in finding someone to sit with her mother and there being no OOP cost for her mother. She would like to sign her mother up for medicaid. CM explained that is not an immediate process and pt would have to qualify. CM provided daughter with contact info for the Development worker, community and DSS. CM explained to the patients daughter that medicare would pay for nursing and PT to evaluate and treat her mother temporarily but they only make short visits and would not be coming every day. Family will initially be responsible for patient care and supervision, patients daughter makes comments that she may end up paying someone to sit with her mother.   Sherald Barge, RN 01/30/2017, 9:24 AM

## 2017-01-30 NOTE — Care Management Important Message (Signed)
Important Message  Patient Details  Name: Destiny Boyd MRN: [id number] Date of Birth: [birth year]   Medicare Important Message Given:  Yes    Sherald Barge, RN 01/30/2017, 9:20 AM

## 2017-01-30 NOTE — Progress Notes (Signed)
ANTICOAGULATION CONSULT NOTE -   Pharmacy Consult for Coumadin Indication: atrial fibrillation  No Known Allergies  Patient Measurements: Height: 5\' 4"  (162.6 cm) Weight: 119 lb 7.8 oz (54.2 kg) IBW/kg (Calculated) : 54.7 Heparin Dosing Weight: 52.8 kg  Vital Signs: Temp: 98.2 F (36.8 C) (06/15 0628) Temp Source: Oral (06/15 0628) BP: 134/67 (06/15 0628) Pulse Rate: 87 (06/15 0628)  Labs:  Recent Labs  01/27/17 1544 01/28/17 0118 01/28/17 0526 01/29/17 0516 01/30/17 0405  HGB  --   --  9.6* 10.6*  --   HCT  --   --  29.4* 33.2*  --   PLT  --   --  189 195  --   LABPROT  --   --  25.8* 33.1* 34.3*  INR  --   --  2.31 3.16 3.30  HEPARINUNFRC 0.87* 0.79*  --   --   --   CREATININE  --   --  7.12*  --  6.15*   Estimated Creatinine Clearance: 6.2 mL/min (A) (by C-G formula based on SCr of 6.15 mg/dL (H)).  Medical History: Past Medical History:  Diagnosis Date  . 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 Colon cancer ([place])    cecum cancer  . Diabetes mellitus   . ESRD (end stage renal disease) ([place])   . Gout   . Hypercholesteremia   . Hypertension   . Iron deficiency anemia   . Leukopenia   . Stroke [facility]) 2005   Medications:  Prescriptions Prior to Admission  Medication Sig Dispense Refill Last Dose  . aspirin EC 81 MG tablet Take 81 mg by mouth daily.   01/24/2017 at Unknown time  . baclofen (LIORESAL) 10 MG tablet Take 10 mg by mouth daily.   Past Week at Unknown time  . Multiple Vitamin (MULTIVITAMIN) tablet Take 1 tablet by mouth daily.   01/23/2017 at Unknown time  . traMADol (ULTRAM) 50 MG tablet Take 50 mg by mouth 2 (two) times daily.   Past Week at Unknown time  . calcitRIOL (ROCALTROL) 0.25 MCG capsule Take 0.25 mcg by mouth daily. Reported on 02/21/2016   Not Taking at Unknown time   Assessment: 82 yo female ESRD on dialysis.  New onset atrial fibrillation. Labs reviewed PTA medications reviewed INR increase to 3.3. INR is now rising   HCT/HGB is  relatively stable.  Patient was able to keep pills down yesterday and no nausea reported. Heparin discontinued.   Goal of Therapy:  Heparin level 0.3-0.7 units/ml Monitor platelets by anticoagulation protocol: Yes   Plan:  No coumadin  today. INR/PT daily Monitor CBC, platelets, signs of bleeding  Hart Robinsons, PharmD Clinical Pharmacist Pager:  [phone] 01/30/2017   01/30/2017,10:29 AM

## 2017-01-30 NOTE — Progress Notes (Signed)
Iv removed, WNL. D/C instructions given to pt and pt family member. Verbalized understanding. Pt family member present to transport home.

## 2017-01-30 NOTE — Progress Notes (Signed)
Subjective: Interval History: Patient appetite is good and she denies any nausea or vomiting. Objective: Vital signs in last 24 hours: Temp:  [98.2 F (36.8 C)-98.7 F (37.1 C)] 98.2 F (36.8 C) (06/15 0628) Pulse Rate:  [87-91] 87 (06/15 0628) Resp:  [18] 18 (06/15 0628) BP: (121-153)/(62-67) 134/67 (06/15 0628) SpO2:  [95 %-100 %] 100 % (06/15 0628) Weight change:   Intake/Output from previous day: 06/14 0701 - 06/15 0700 In: 480 [P.O.:480] Out: 1000 [Urine:1000] Intake/Output this shift: No intake/output data recorded.  General appearance: alert, cooperative and no distress Resp: clear to auscultation bilaterally Cardio: irregularly irregular rhythm Extremities: extremities normal, atraumatic, no cyanosis or edema  Lab Results:  Recent Labs  01/28/17 0526 01/29/17 0516  WBC 3.3* 3.7*  HGB 9.6* 10.6*  HCT 29.4* 33.2*  PLT 189 195   BMET:   Recent Labs  01/28/17 0526 01/30/17 0405  NA 127* 127*  K 4.5 4.3  CL 88* 88*  CO2 25 26  GLUCOSE 146* 135*  BUN 27* 35*  CREATININE 7.12* 6.15*  CALCIUM 9.5 9.6   No results for input(s): PTH in the last 72 hours. Iron Studies:  No results for input(s): IRON, TIBC, TRANSFERRIN, FERRITIN in the last 72 hours.  Studies/Results: No results found.  I have reviewed the patient's current medications.  Assessment/Plan: Problem #1  atrial fibrillation: Presently she is on Cardizem and her heart rate is controlled. Problem #2 end-stage renal disease: She is status post hemodialysis On Wednesday. Presently her potassium is normal and patient doesn't have any uremic signs and symptoms. Her regular schedule is Tuesday/Thursday/Saturday. Hence her regular outpatient dialysis will be tomorrow. Problem #3 fluid management: Patient doesn't have any sign of fluid overload. Problem #4 anemia: Her hemoglobin is within our target goal. She is on Epogen. Problem #5 hypertension: Her blood pressure is reasonably controlled Problem #6  bone and metabolic disorder: Patient is on a binder. Her calcium and phosphorus is in range. Her phosphorus has improved.  Problem #7 diabetes: Her blood sugar is reasonably controlled. Plan: 1] patient doesn't need dialysis today if she is going to be discharged 2 We'll make arrangements for patient to get dialysis tomorrow as an outpatient    LOS: 5 days   Calin Fantroy S 01/30/2017,7:38 AM

## 2017-01-30 NOTE — Discharge Summary (Signed)
Physician Discharge Summary  Patient ID: Destiny Boyd MRN: [id number] DOB/AGE: February 23, 1934 83 y.o. Primary Care Physician:Lacee Grey, MD Admit date: 01/24/2017 Discharge date: 01/30/2017    Discharge Diagnoses:    Principal Problem:   Atrial fibrillation with RVR ([place]) Active Problems:   Essential hypertension   ESRD on dialysis ([place])   Elevated troponin   DM type 2 (diabetes mellitus, type 2) (HCC)   Aortic atherosclerosis (HCC)   Nausea and vomiting   Anemia   Demand ischemia of myocardium ([place])   Encounter for anticoagulation discussion and counseling   Allergies as of 01/30/2017   No Known Allergies     Medication List    TAKE these medications   aspirin EC 81 MG tablet Take 81 mg by mouth daily.   baclofen 10 MG tablet Commonly known as:  LIORESAL Take 10 mg by mouth daily.   calcitRIOL 0.25 MCG capsule Commonly known as:  ROCALTROL Take 0.25 mcg by mouth daily. Reported on 02/21/2016   diltiazem 120 MG 24 hr capsule Commonly known as:  CARDIZEM CD Take 1 capsule (120 mg total) by mouth 2 (two) times daily.   multivitamin tablet Take 1 tablet by mouth daily.   pantoprazole 40 MG tablet Commonly known as:  PROTONIX Take 1 tablet (40 mg total) by mouth daily.   traMADol 50 MG tablet Commonly known as:  ULTRAM Take 50 mg by mouth 2 (two) times daily.   warfarin 2.5 MG tablet Commonly known as:  COUMADIN Take 1 tablet (2.5 mg total) by mouth daily.            Durable Medical Equipment        Start     Ordered   01/27/17 1412  For home use only DME Walker rolling  Once    Question:  Patient needs a walker to treat with the following condition  Answer:  Weakness   01/27/17 1413      Discharged Condition: improved    Consults: cardiology, nephrology and GI  Significant Diagnostic Studies: Dg Chest Port 1 View  Result Date: 01/24/2017 CLINICAL DATA:  Cardiac arrhythmia. End-stage renal failure. Hypertension. EXAM: PORTABLE CHEST 1  VIEW COMPARISON:  Dec 19, 2014 FINDINGS: There is no edema or consolidation. Heart is enlarged with pulmonary venous hypertension. No adenopathy. There is aortic atherosclerosis. No bone lesions. IMPRESSION: Pulmonary vascular congestion without edema or consolidation. There is aortic atherosclerosis. Electronically Signed   By: Lowella Grip III M.D.   On: 01/24/2017 08:28    Lab Results: Basic Metabolic Panel:  Recent Labs  01/28/17 0526 01/30/17 0405  NA 127* 127*  K 4.5 4.3  CL 88* 88*  CO2 25 26  GLUCOSE 146* 135*  BUN 27* 35*  CREATININE 7.12* 6.15*  CALCIUM 9.5 9.6  PHOS 7.5* 5.4*   Liver Function Tests:  Recent Labs  01/28/17 0526 01/30/17 0405  ALBUMIN 2.8* 2.9*     CBC:  Recent Labs  01/28/17 0526 01/29/17 0516  WBC 3.3* 3.7*  HGB 9.6* 10.6*  HCT 29.4* 33.2*  MCV 86.5 86.5  PLT 189 195    Recent Results (from the past 240 hour(s))  MRSA PCR Screening     Status: None   Collection Time: 01/24/17  2:13 PM  Result Value Ref Range Status   MRSA by PCR NEGATIVE NEGATIVE Final    Comment:        The GeneXpert MRSA Assay (FDA approved for NASAL specimens only), is one component of a comprehensive  MRSA colonization surveillance program. It is not intended to diagnose MRSA infection nor to guide or monitor treatment for MRSA infections.      Hospital Course:   This is an 82 years  Old female with history of multiple medical illnesses was admitted due to new onset atrial fibrillation with RVR. Patient was admitted under telemetry and was started on heparin drip, Cardizem drip and coumadin. Patient had recurrent nauseea and vomiting which was controlled with symptomatic treatment. Patient was seen by nephrologist and had her maintenance hemodialysis. She was also evaluated by cardiology and GI. Her medications adjusted. Her heart rate controlled and fully anticoagulated. Patient is deconditions and she declined in her ambulation. She will be discharged  with home health to do physical therapy and monitor her PT/INR.  Discharge Exam: Blood pressure 134/67, pulse 87, temperature 98.2 F (36.8 C), temperature source Oral, resp. rate 18, height 5\' 4"  (1.626 m), weight 54.2 kg (119 lb 7.8 oz), SpO2 100 %.    Disposition:  Home.  Discharge Instructions    Amb referral to AFIB Clinic    Complete by:  As directed       Follow-up Information    Rosita Fire, MD Follow up in 1 week(s).   Specialty:  Internal Medicine Contact information: [address] [place] [address] [phone]           Signed: Rosita Fire   01/30/2017, 7:49 AM

## 2017-01-31 DIAGNOSIS — N2581 Secondary hyperparathyroidism of renal origin: Secondary | ICD-10-CM | POA: Diagnosis not present

## 2017-01-31 DIAGNOSIS — Z992 Dependence on renal dialysis: Secondary | ICD-10-CM | POA: Diagnosis not present

## 2017-01-31 DIAGNOSIS — N186 End stage renal disease: Secondary | ICD-10-CM | POA: Diagnosis not present

## 2017-01-31 DIAGNOSIS — D631 Anemia in chronic kidney disease: Secondary | ICD-10-CM | POA: Diagnosis not present

## 2017-02-02 DIAGNOSIS — Z7901 Long term (current) use of anticoagulants: Secondary | ICD-10-CM | POA: Diagnosis not present

## 2017-02-02 DIAGNOSIS — N186 End stage renal disease: Secondary | ICD-10-CM | POA: Diagnosis not present

## 2017-02-02 DIAGNOSIS — I248 Other forms of acute ischemic heart disease: Secondary | ICD-10-CM | POA: Diagnosis not present

## 2017-02-02 DIAGNOSIS — Z5181 Encounter for therapeutic drug level monitoring: Secondary | ICD-10-CM | POA: Diagnosis not present

## 2017-02-02 DIAGNOSIS — Z992 Dependence on renal dialysis: Secondary | ICD-10-CM | POA: Diagnosis not present

## 2017-02-02 DIAGNOSIS — E1122 Type 2 diabetes mellitus with diabetic chronic kidney disease: Secondary | ICD-10-CM | POA: Diagnosis not present

## 2017-02-02 DIAGNOSIS — I4891 Unspecified atrial fibrillation: Secondary | ICD-10-CM | POA: Diagnosis not present

## 2017-02-02 DIAGNOSIS — I120 Hypertensive chronic kidney disease with stage 5 chronic kidney disease or end stage renal disease: Secondary | ICD-10-CM | POA: Diagnosis not present

## 2017-02-02 DIAGNOSIS — D631 Anemia in chronic kidney disease: Secondary | ICD-10-CM | POA: Diagnosis not present

## 2017-02-02 DIAGNOSIS — I700 Atherosclerosis of aorta: Secondary | ICD-10-CM | POA: Diagnosis not present

## 2017-02-03 DIAGNOSIS — N186 End stage renal disease: Secondary | ICD-10-CM | POA: Diagnosis not present

## 2017-02-03 DIAGNOSIS — Z992 Dependence on renal dialysis: Secondary | ICD-10-CM | POA: Diagnosis not present

## 2017-02-03 DIAGNOSIS — N2581 Secondary hyperparathyroidism of renal origin: Secondary | ICD-10-CM | POA: Diagnosis not present

## 2017-02-03 DIAGNOSIS — D631 Anemia in chronic kidney disease: Secondary | ICD-10-CM | POA: Diagnosis not present

## 2017-02-04 DIAGNOSIS — E1122 Type 2 diabetes mellitus with diabetic chronic kidney disease: Secondary | ICD-10-CM | POA: Diagnosis not present

## 2017-02-04 DIAGNOSIS — I4891 Unspecified atrial fibrillation: Secondary | ICD-10-CM | POA: Diagnosis not present

## 2017-02-04 DIAGNOSIS — I120 Hypertensive chronic kidney disease with stage 5 chronic kidney disease or end stage renal disease: Secondary | ICD-10-CM | POA: Diagnosis not present

## 2017-02-04 DIAGNOSIS — N186 End stage renal disease: Secondary | ICD-10-CM | POA: Diagnosis not present

## 2017-02-04 DIAGNOSIS — D631 Anemia in chronic kidney disease: Secondary | ICD-10-CM | POA: Diagnosis not present

## 2017-02-04 DIAGNOSIS — I248 Other forms of acute ischemic heart disease: Secondary | ICD-10-CM | POA: Diagnosis not present

## 2017-02-05 DIAGNOSIS — D631 Anemia in chronic kidney disease: Secondary | ICD-10-CM | POA: Diagnosis not present

## 2017-02-05 DIAGNOSIS — I248 Other forms of acute ischemic heart disease: Secondary | ICD-10-CM | POA: Diagnosis not present

## 2017-02-05 DIAGNOSIS — N186 End stage renal disease: Secondary | ICD-10-CM | POA: Diagnosis not present

## 2017-02-05 DIAGNOSIS — I120 Hypertensive chronic kidney disease with stage 5 chronic kidney disease or end stage renal disease: Secondary | ICD-10-CM | POA: Diagnosis not present

## 2017-02-05 DIAGNOSIS — E1122 Type 2 diabetes mellitus with diabetic chronic kidney disease: Secondary | ICD-10-CM | POA: Diagnosis not present

## 2017-02-05 DIAGNOSIS — N2581 Secondary hyperparathyroidism of renal origin: Secondary | ICD-10-CM | POA: Diagnosis not present

## 2017-02-05 DIAGNOSIS — I4891 Unspecified atrial fibrillation: Secondary | ICD-10-CM | POA: Diagnosis not present

## 2017-02-05 DIAGNOSIS — Z992 Dependence on renal dialysis: Secondary | ICD-10-CM | POA: Diagnosis not present

## 2017-02-06 DIAGNOSIS — I248 Other forms of acute ischemic heart disease: Secondary | ICD-10-CM | POA: Diagnosis not present

## 2017-02-06 DIAGNOSIS — I4891 Unspecified atrial fibrillation: Secondary | ICD-10-CM | POA: Diagnosis not present

## 2017-02-06 DIAGNOSIS — I120 Hypertensive chronic kidney disease with stage 5 chronic kidney disease or end stage renal disease: Secondary | ICD-10-CM | POA: Diagnosis not present

## 2017-02-06 DIAGNOSIS — N186 End stage renal disease: Secondary | ICD-10-CM | POA: Diagnosis not present

## 2017-02-06 DIAGNOSIS — E1122 Type 2 diabetes mellitus with diabetic chronic kidney disease: Secondary | ICD-10-CM | POA: Diagnosis not present

## 2017-02-06 DIAGNOSIS — E119 Type 2 diabetes mellitus without complications: Secondary | ICD-10-CM | POA: Diagnosis not present

## 2017-02-06 DIAGNOSIS — D631 Anemia in chronic kidney disease: Secondary | ICD-10-CM | POA: Diagnosis not present

## 2017-02-06 DIAGNOSIS — I10 Essential (primary) hypertension: Secondary | ICD-10-CM | POA: Diagnosis not present

## 2017-02-07 DIAGNOSIS — N186 End stage renal disease: Secondary | ICD-10-CM | POA: Diagnosis not present

## 2017-02-07 DIAGNOSIS — Z992 Dependence on renal dialysis: Secondary | ICD-10-CM | POA: Diagnosis not present

## 2017-02-07 DIAGNOSIS — N2581 Secondary hyperparathyroidism of renal origin: Secondary | ICD-10-CM | POA: Diagnosis not present

## 2017-02-07 DIAGNOSIS — D631 Anemia in chronic kidney disease: Secondary | ICD-10-CM | POA: Diagnosis not present

## 2017-02-09 DIAGNOSIS — E1122 Type 2 diabetes mellitus with diabetic chronic kidney disease: Secondary | ICD-10-CM | POA: Diagnosis not present

## 2017-02-09 DIAGNOSIS — D631 Anemia in chronic kidney disease: Secondary | ICD-10-CM | POA: Diagnosis not present

## 2017-02-09 DIAGNOSIS — I4891 Unspecified atrial fibrillation: Secondary | ICD-10-CM | POA: Diagnosis not present

## 2017-02-09 DIAGNOSIS — I120 Hypertensive chronic kidney disease with stage 5 chronic kidney disease or end stage renal disease: Secondary | ICD-10-CM | POA: Diagnosis not present

## 2017-02-09 DIAGNOSIS — I248 Other forms of acute ischemic heart disease: Secondary | ICD-10-CM | POA: Diagnosis not present

## 2017-02-09 DIAGNOSIS — N186 End stage renal disease: Secondary | ICD-10-CM | POA: Diagnosis not present

## 2017-02-10 DIAGNOSIS — I120 Hypertensive chronic kidney disease with stage 5 chronic kidney disease or end stage renal disease: Secondary | ICD-10-CM | POA: Diagnosis not present

## 2017-02-10 DIAGNOSIS — Z992 Dependence on renal dialysis: Secondary | ICD-10-CM | POA: Diagnosis not present

## 2017-02-10 DIAGNOSIS — N2581 Secondary hyperparathyroidism of renal origin: Secondary | ICD-10-CM | POA: Diagnosis not present

## 2017-02-10 DIAGNOSIS — E1122 Type 2 diabetes mellitus with diabetic chronic kidney disease: Secondary | ICD-10-CM | POA: Diagnosis not present

## 2017-02-10 DIAGNOSIS — D631 Anemia in chronic kidney disease: Secondary | ICD-10-CM | POA: Diagnosis not present

## 2017-02-10 DIAGNOSIS — I248 Other forms of acute ischemic heart disease: Secondary | ICD-10-CM | POA: Diagnosis not present

## 2017-02-10 DIAGNOSIS — N186 End stage renal disease: Secondary | ICD-10-CM | POA: Diagnosis not present

## 2017-02-10 DIAGNOSIS — I4891 Unspecified atrial fibrillation: Secondary | ICD-10-CM | POA: Diagnosis not present

## 2017-02-11 ENCOUNTER — Encounter: Payer: Self-pay | Admitting: Physician Assistant

## 2017-02-11 ENCOUNTER — Ambulatory Visit (INDEPENDENT_AMBULATORY_CARE_PROVIDER_SITE_OTHER): Payer: Medicare Other | Admitting: Physician Assistant

## 2017-02-11 VITALS — BP 146/83 | HR 87 | Ht 64.0 in | Wt 119.8 lb

## 2017-02-11 DIAGNOSIS — I10 Essential (primary) hypertension: Secondary | ICD-10-CM

## 2017-02-11 DIAGNOSIS — I120 Hypertensive chronic kidney disease with stage 5 chronic kidney disease or end stage renal disease: Secondary | ICD-10-CM | POA: Diagnosis not present

## 2017-02-11 DIAGNOSIS — I4891 Unspecified atrial fibrillation: Secondary | ICD-10-CM

## 2017-02-11 DIAGNOSIS — I248 Other forms of acute ischemic heart disease: Secondary | ICD-10-CM | POA: Diagnosis not present

## 2017-02-11 DIAGNOSIS — N186 End stage renal disease: Secondary | ICD-10-CM | POA: Diagnosis not present

## 2017-02-11 DIAGNOSIS — E1122 Type 2 diabetes mellitus with diabetic chronic kidney disease: Secondary | ICD-10-CM | POA: Diagnosis not present

## 2017-02-11 DIAGNOSIS — D631 Anemia in chronic kidney disease: Secondary | ICD-10-CM | POA: Diagnosis not present

## 2017-02-11 NOTE — Patient Instructions (Signed)
Your physician recommends that you schedule a follow-up appointment in: 2 Months with Dr. Bronson Ing  Your physician recommends that you continue on your current medications as directed. Please refer to the Current Medication list given to you today.   Edrick Oh will call with INR appt.   If you need a refill on your cardiac medications before your next appointment, please call your pharmacy.  Thank you for choosing [facility]!

## 2017-02-11 NOTE — Progress Notes (Signed)
Cardiology Office Note    Date:  02/11/2017   ID:  Destiny Boyd, DOB [birth year], MRN [id number]  PCP:  Rosita Fire, MD  Cardiologist: Dr. Bronson Ing  No chief complaint on file.   History of Present Illness:  Destiny Boyd is a 83 y.o. female with a hx of ESRD on dialysis, diabetes, CVA, (not embolic) and colon cancer,Who was discharged from the hospital 01/30/17 with atrial fib with RVR treated with increased dose of diltiazem and Coumadin. 2-D echo 01/24/17 normal LVEF 55-60% with severe calcified mitral valve mild regurgitation atrium was mildly dilated.  Patient comes in today accompanied by her daughter-in-law. She denies any further racing heart, palpitations, dyspnea, dyspnea on exertion, dizziness or presyncope. Home health has been checking her INRs and calling them to Dr. Josephine Cables office.       Past Medical History:  Diagnosis Date  . 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 Colon cancer ([place])    cecum cancer  . Diabetes mellitus   . ESRD (end stage renal disease) ([place])   . Gout   . Hypercholesteremia   . Hypertension   . Iron deficiency anemia   . Leukopenia   . Stroke [facility]) 2005    Past Surgical History:  Procedure Laterality Date  . ABDOMINAL HYSTERECTOMY    . AV FISTULA PLACEMENT Left 08/24/2013   Procedure: ARTERIOVENOUS (AV) FISTULA CREATION- LEFT BRACHIAL CEPHALIC;  Surgeon: Conrad Wesson, MD;  Location: [facility];  Service: Vascular;  Laterality: Left;  . CATARACT EXTRACTION, BILATERAL     lens implants  . COLON SURGERY  2010  . COLONOSCOPY  2008   Dr. Oneida Alar: 3-4 cm cecal polyp partially removed, path noting cecal adenocarcinoma, underwent right hemicolectomy with TI resection  . COLONOSCOPY  2009   Dr. Oneida Alar: 4mm sessile rectal polyp (benign), rare diverticula  . ESOPHAGOGASTRODUODENOSCOPY  2008   Dr. Oneida Alar: normal esophagus  . ESOPHAGOGASTRODUODENOSCOPY  2009   Dr. Oneida Alar: multiple antral erosions (reactive gastropathy), normal duodenum, normal  esophagus  . EYE SURGERY    . GIVENS CAPSULE STUDY  2009   normal   . HEMICOLECTOMY  2008  . PERIPHERAL VASCULAR CATHETERIZATION N/A 03/03/2016   Procedure: Fistulagram;  Surgeon: Angelia Mould, MD;  Location: [facility] CV LAB;  Service: Cardiovascular;  Laterality: N/A;  . PERIPHERAL VASCULAR CATHETERIZATION Left 03/03/2016   Procedure: Peripheral Vascular Balloon Angioplasty;  Surgeon: Angelia Mould, MD;  Location: [facility] CV LAB;  Service: Cardiovascular;  Laterality: Left;  arm fistula pta    Current Medications: Current Meds  Medication Sig  . diltiazem (CARDIZEM CD) 120 MG 24 hr capsule Take 1 capsule (120 mg total) by mouth 2 (two) times daily.  . multivitamin (RENA-VIT) TABS tablet Take 1 tablet by mouth daily.  . pantoprazole (PROTONIX) 40 MG tablet Take 1 tablet (40 mg total) by mouth daily.  . sevelamer carbonate (RENVELA) 800 MG tablet Take 3,200 mg by mouth 3 (three) times daily with meals. & 1600 by mouth with snacks  . TRADJENTA 5 MG TABS tablet Take 1 tablet by mouth daily.  . traMADol (ULTRAM) 50 MG tablet Take 50 mg by mouth 2 (two) times daily.  Marland Kitchen warfarin (COUMADIN) 3 MG tablet Take 1 tablet by mouth daily.     Allergies:   Patient has no known allergies.   Social History   Social History  . Marital status: Widowed    Spouse name: N/A  . Number of children: N/A  . Years of  education: N/A   Social History Main Topics  . Smoking status: Former Smoker    Packs/day: 1.00    Years: 10.00    Types: Cigarettes    Quit date: 04/12/1985  . Smokeless tobacco: Never Used  . Alcohol use No  . Drug use: No  . Sexual activity: No   Other Topics Concern  . None   Social History Narrative  . None     Family History:  The patient's   family history includes Cancer in her son; Diabetes in her mother and son; Heart disease in her son; Hyperlipidemia in her daughter and son; Hypertension in her mother; Varicose Veins in her daughter.   ROS:     Please see the history of present illness.    Review of Systems  Constitution: Negative.  HENT: Negative.   Eyes: Negative.   Cardiovascular: Negative.   Respiratory: Positive for wheezing.   Hematologic/Lymphatic: Negative.   Musculoskeletal: Positive for myalgias. Negative for joint pain.  Gastrointestinal: Negative.   Genitourinary: Negative.   Neurological: Positive for headaches.   All other systems reviewed and are negative.   PHYSICAL EXAM:   VS:  BP (!) 146/83   Pulse 87   Ht 5\' 4"  (1.626 m)   Wt 119 lb 12.8 oz (54.3 kg)   BMI 20.56 kg/m   Physical Exam  GEN: Well nourished, well developed, in no acute distress  Neck: no JVD, carotid bruits, or masses Cardiac:RRR; 2 to 3/6 systolic murmur at the left sternal border, 1/6 diastolic murmur at the left sternal border Respiratory:  clear to auscultation bilaterally, normal work of breathing GI: soft, nontender, nondistended, + BS Ext: without cyanosis, clubbing, or edema, Good distal pulses bilaterally Neuro:  Alert and Oriented x 3 Psych: euthymic mood, full affect  Wt Readings from Last 3 Encounters:  02/11/17 119 lb 12.8 oz (54.3 kg)  01/29/17 119 lb 7.8 oz (54.2 kg)  03/03/16 120 lb (54.4 kg)      Studies/Labs Reviewed:   EKG:  EKG is  ordered today.  The ekg ordered today demonstrates Normal sinus rhythm  Recent Labs: 01/24/2017: Magnesium 1.9 01/25/2017: TSH 0.043 01/29/2017: Hemoglobin 10.6; Platelets 195 01/30/2017: BUN 35; Creatinine, Ser 6.15; Potassium 4.3; Sodium 127   Lipid Panel No results found for: CHOL, TRIG, HDL, CHOLHDL, VLDL, LDLCALC, LDLDIRECT  Additional studies/ records that were reviewed today include:    Echocardiogram 01/24/2017 Left ventricle: Wall thickness was increased in a pattern of mild   LVH. Systolic function was normal. The estimated ejection   fraction was in the range of 55% to 60%. Left ventricular   diastolic function parameters were normal. - Mitral valve: Severely  calcified, severely thickened annulus.   Moderately thickened, moderately calcified leaflets . There was   mild regurgitation. - Left atrium: The atrium was mildly dilated. - Atrial septum: No defect or patent foramen ovale was identified. - Pulmonary arteries: PA peak pressure: 35 mm Hg (S).     ASSESSMENT:    1. Atrial fibrillation with RVR ([place])   2. Essential hypertension   3. Demand ischemia of myocardium (HCC)      PLAN:  In order of problems listed above:  Atrial fibrillation with RVR while in the hospital controlled with increase diltiazem 120 mg twice a day. In normal sinus rhythm today. Also on Coumadin currently being made by home health and Dr. Legrand Rams. Once home health is finished seen her she will be followed by our Coumadin clinic here. F/U with  Dr. Bronson Ing in 2 months.  Essential hypertension controlled  Demand ischemia secondary to rapid atrial fibrillation. No chest pain.    Medication Adjustments/Labs and Tests Ordered: Current medicines are reviewed at length with the patient today.  Concerns regarding medicines are outlined above.  Medication changes, Labs and Tests ordered today are listed in the Patient Instructions below. Patient Instructions  Your physician recommends that you schedule a follow-up appointment in: 2 Months with Dr. Bronson Ing  Your physician recommends that you continue on your current medications as directed. Please refer to the Current Medication list given to you today.   Edrick Oh will call with INR appt.   If you need a refill on your cardiac medications before your next appointment, please call your pharmacy.  Thank you for choosing [facility]!      Signed, Ermalinda Barrios, PA-C  02/11/2017 1:25 PM    [facility] [address], [place], [place]  [address] Phone: [phone]; Fax: [phone]

## 2017-02-12 DIAGNOSIS — N186 End stage renal disease: Secondary | ICD-10-CM | POA: Diagnosis not present

## 2017-02-12 DIAGNOSIS — N2581 Secondary hyperparathyroidism of renal origin: Secondary | ICD-10-CM | POA: Diagnosis not present

## 2017-02-12 DIAGNOSIS — I248 Other forms of acute ischemic heart disease: Secondary | ICD-10-CM | POA: Diagnosis not present

## 2017-02-12 DIAGNOSIS — D631 Anemia in chronic kidney disease: Secondary | ICD-10-CM | POA: Diagnosis not present

## 2017-02-12 DIAGNOSIS — I120 Hypertensive chronic kidney disease with stage 5 chronic kidney disease or end stage renal disease: Secondary | ICD-10-CM | POA: Diagnosis not present

## 2017-02-12 DIAGNOSIS — E1122 Type 2 diabetes mellitus with diabetic chronic kidney disease: Secondary | ICD-10-CM | POA: Diagnosis not present

## 2017-02-12 DIAGNOSIS — Z992 Dependence on renal dialysis: Secondary | ICD-10-CM | POA: Diagnosis not present

## 2017-02-12 DIAGNOSIS — I4891 Unspecified atrial fibrillation: Secondary | ICD-10-CM | POA: Diagnosis not present

## 2017-02-13 DIAGNOSIS — N186 End stage renal disease: Secondary | ICD-10-CM | POA: Diagnosis not present

## 2017-02-13 DIAGNOSIS — E1122 Type 2 diabetes mellitus with diabetic chronic kidney disease: Secondary | ICD-10-CM | POA: Diagnosis not present

## 2017-02-13 DIAGNOSIS — I4891 Unspecified atrial fibrillation: Secondary | ICD-10-CM | POA: Diagnosis not present

## 2017-02-13 DIAGNOSIS — I120 Hypertensive chronic kidney disease with stage 5 chronic kidney disease or end stage renal disease: Secondary | ICD-10-CM | POA: Diagnosis not present

## 2017-02-13 DIAGNOSIS — I248 Other forms of acute ischemic heart disease: Secondary | ICD-10-CM | POA: Diagnosis not present

## 2017-02-13 DIAGNOSIS — D631 Anemia in chronic kidney disease: Secondary | ICD-10-CM | POA: Diagnosis not present

## 2017-02-14 DIAGNOSIS — D631 Anemia in chronic kidney disease: Secondary | ICD-10-CM | POA: Diagnosis not present

## 2017-02-14 DIAGNOSIS — N2581 Secondary hyperparathyroidism of renal origin: Secondary | ICD-10-CM | POA: Diagnosis not present

## 2017-02-14 DIAGNOSIS — Z992 Dependence on renal dialysis: Secondary | ICD-10-CM | POA: Diagnosis not present

## 2017-02-14 DIAGNOSIS — N186 End stage renal disease: Secondary | ICD-10-CM | POA: Diagnosis not present

## 2017-02-16 DIAGNOSIS — N186 End stage renal disease: Secondary | ICD-10-CM | POA: Diagnosis not present

## 2017-02-16 DIAGNOSIS — I4891 Unspecified atrial fibrillation: Secondary | ICD-10-CM | POA: Diagnosis not present

## 2017-02-16 DIAGNOSIS — I120 Hypertensive chronic kidney disease with stage 5 chronic kidney disease or end stage renal disease: Secondary | ICD-10-CM | POA: Diagnosis not present

## 2017-02-16 DIAGNOSIS — I248 Other forms of acute ischemic heart disease: Secondary | ICD-10-CM | POA: Diagnosis not present

## 2017-02-16 DIAGNOSIS — E1122 Type 2 diabetes mellitus with diabetic chronic kidney disease: Secondary | ICD-10-CM | POA: Diagnosis not present

## 2017-02-16 DIAGNOSIS — D631 Anemia in chronic kidney disease: Secondary | ICD-10-CM | POA: Diagnosis not present

## 2017-02-17 DIAGNOSIS — N186 End stage renal disease: Secondary | ICD-10-CM | POA: Diagnosis not present

## 2017-02-17 DIAGNOSIS — D631 Anemia in chronic kidney disease: Secondary | ICD-10-CM | POA: Diagnosis not present

## 2017-02-17 DIAGNOSIS — Z992 Dependence on renal dialysis: Secondary | ICD-10-CM | POA: Diagnosis not present

## 2017-02-17 DIAGNOSIS — N2581 Secondary hyperparathyroidism of renal origin: Secondary | ICD-10-CM | POA: Diagnosis not present

## 2017-02-17 DIAGNOSIS — D509 Iron deficiency anemia, unspecified: Secondary | ICD-10-CM | POA: Diagnosis not present

## 2017-02-19 DIAGNOSIS — N2581 Secondary hyperparathyroidism of renal origin: Secondary | ICD-10-CM | POA: Diagnosis not present

## 2017-02-19 DIAGNOSIS — I120 Hypertensive chronic kidney disease with stage 5 chronic kidney disease or end stage renal disease: Secondary | ICD-10-CM | POA: Diagnosis not present

## 2017-02-19 DIAGNOSIS — I4891 Unspecified atrial fibrillation: Secondary | ICD-10-CM | POA: Diagnosis not present

## 2017-02-19 DIAGNOSIS — D509 Iron deficiency anemia, unspecified: Secondary | ICD-10-CM | POA: Diagnosis not present

## 2017-02-19 DIAGNOSIS — E1122 Type 2 diabetes mellitus with diabetic chronic kidney disease: Secondary | ICD-10-CM | POA: Diagnosis not present

## 2017-02-19 DIAGNOSIS — Z992 Dependence on renal dialysis: Secondary | ICD-10-CM | POA: Diagnosis not present

## 2017-02-19 DIAGNOSIS — I248 Other forms of acute ischemic heart disease: Secondary | ICD-10-CM | POA: Diagnosis not present

## 2017-02-19 DIAGNOSIS — D631 Anemia in chronic kidney disease: Secondary | ICD-10-CM | POA: Diagnosis not present

## 2017-02-19 DIAGNOSIS — N186 End stage renal disease: Secondary | ICD-10-CM | POA: Diagnosis not present

## 2017-02-20 ENCOUNTER — Emergency Department (HOSPITAL_COMMUNITY)
Admission: EM | Admit: 2017-02-20 | Discharge: 2017-02-20 | Disposition: A | Discharge status: Home / Self Care | Payer: Medicare Other | Attending: Emergency Medicine | Admitting: Emergency Medicine

## 2017-02-20 ENCOUNTER — Encounter (HOSPITAL_COMMUNITY): Payer: Self-pay | Admitting: Emergency Medicine

## 2017-02-20 DIAGNOSIS — Z7901 Long term (current) use of anticoagulants: Secondary | ICD-10-CM | POA: Diagnosis not present

## 2017-02-20 DIAGNOSIS — D689 Coagulation defect, unspecified: Secondary | ICD-10-CM | POA: Diagnosis not present

## 2017-02-20 DIAGNOSIS — N186 End stage renal disease: Secondary | ICD-10-CM | POA: Diagnosis not present

## 2017-02-20 DIAGNOSIS — Z79899 Other long term (current) drug therapy: Secondary | ICD-10-CM | POA: Insufficient documentation

## 2017-02-20 DIAGNOSIS — E1122 Type 2 diabetes mellitus with diabetic chronic kidney disease: Secondary | ICD-10-CM | POA: Insufficient documentation

## 2017-02-20 DIAGNOSIS — Z992 Dependence on renal dialysis: Secondary | ICD-10-CM | POA: Insufficient documentation

## 2017-02-20 DIAGNOSIS — I4891 Unspecified atrial fibrillation: Secondary | ICD-10-CM | POA: Diagnosis not present

## 2017-02-20 DIAGNOSIS — E119 Type 2 diabetes mellitus without complications: Secondary | ICD-10-CM | POA: Diagnosis not present

## 2017-02-20 DIAGNOSIS — R799 Abnormal finding of blood chemistry, unspecified: Secondary | ICD-10-CM | POA: Diagnosis present

## 2017-02-20 DIAGNOSIS — Z7984 Long term (current) use of oral hypoglycemic drugs: Secondary | ICD-10-CM | POA: Diagnosis not present

## 2017-02-20 DIAGNOSIS — Z87891 Personal history of nicotine dependence: Secondary | ICD-10-CM | POA: Diagnosis not present

## 2017-02-20 DIAGNOSIS — I248 Other forms of acute ischemic heart disease: Secondary | ICD-10-CM | POA: Diagnosis not present

## 2017-02-20 DIAGNOSIS — I120 Hypertensive chronic kidney disease with stage 5 chronic kidney disease or end stage renal disease: Secondary | ICD-10-CM | POA: Insufficient documentation

## 2017-02-20 DIAGNOSIS — D631 Anemia in chronic kidney disease: Secondary | ICD-10-CM | POA: Diagnosis not present

## 2017-02-20 MED ORDER — PHYTONADIONE 5 MG PO TABS
5.0000 mg | ORAL_TABLET | Freq: Once | ORAL | Status: AC
  Administered 2017-02-20: 5 mg via ORAL
  Filled 2017-02-20: qty 1

## 2017-02-20 NOTE — ED Notes (Signed)
Dr Jeneen Rinks at patient's side.

## 2017-02-20 NOTE — ED Triage Notes (Addendum)
Patient states she was sent by a nurse from Dr Legrand Rams today to receive a medication that [facility] does not have. States she had abnormal blood work and the medication starts with a K. Attempted to call Dr Legrand Rams office with no answer. Patient states "they put me on blood thinners and when the nurse came today she told me my blood was thinning too much and I had to stop the blood thinners." Patient denies any symptoms at this time.

## 2017-02-20 NOTE — ED Provider Notes (Signed)
[facility] DEPT Provider Note   CSN: [id number] Arrival date & time: 02/20/17  1601     History   Chief Complaint Chief Complaint  Patient presents with  . Abnormal Lab    HPI Destiny Boyd is a 83 y.o. female. Chief complaint is high INR  HPI 83 year old female. History of atrial fibrillation. Anti-coagulated with Coumadin. Was on 2.5 mg and her INR had drifted down. Recently increased to 3 mg daily. We received a call from Dr. Luan Pulling. INR was 5 today. He was concerned that it may be increasing. He requested she be seen here and given vitamin K as he could not find his pharmacy in town that could provide this medication. She has no bleeding. She feels well.  Past Medical History:  Diagnosis Date  . 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 Colon cancer ([place])    cecum cancer  . Diabetes mellitus   . ESRD (end stage renal disease) ([place])   . Gout   . Hypercholesteremia   . Hypertension   . Iron deficiency anemia   . Leukopenia   . Stroke [facility]) 2005    Patient Active Problem List   Diagnosis Date Noted  . Nausea and vomiting   . Anemia   . Demand ischemia of myocardium ([place])   . Encounter for anticoagulation discussion and counseling   . Atrial fibrillation with RVR ([place]) 01/24/2017  . Elevated troponin 01/24/2017  . DM type 2 (diabetes mellitus, type 2) ([place]) 01/24/2017  . Aortic atherosclerosis ([place]) 01/24/2017  . ESRD on dialysis ([place]) 06/30/2013  . Anemia in chronic kidney disease(285.21) 05/05/2013  . Iron deficiency 05/20/2012  . HEMORRHOIDS 11/22/2008  . HEMATURIA UNSPECIFIED 11/22/2008  . 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 Essential hypertension 10/30/2008  . TOBACCO USE, QUIT 10/30/2008    Past Surgical History:  Procedure Laterality Date  . ABDOMINAL HYSTERECTOMY    . AV FISTULA PLACEMENT Left 08/24/2013   Procedure: ARTERIOVENOUS (AV) FISTULA CREATION- LEFT BRACHIAL CEPHALIC;  Surgeon: Conrad Rancho Alegre, MD;  Location: [facility];  Service: Vascular;   Laterality: Left;  . CATARACT EXTRACTION, BILATERAL     lens implants  . COLON SURGERY  2010  . COLONOSCOPY  2008   Dr. Oneida Alar: 3-4 cm cecal polyp partially removed, path noting cecal adenocarcinoma, underwent right hemicolectomy with TI resection  . COLONOSCOPY  2009   Dr. Oneida Alar: 65mm sessile rectal polyp (benign), rare diverticula  . ESOPHAGOGASTRODUODENOSCOPY  2008   Dr. Oneida Alar: normal esophagus  . ESOPHAGOGASTRODUODENOSCOPY  2009   Dr. Oneida Alar: multiple antral erosions (reactive gastropathy), normal duodenum, normal esophagus  . EYE SURGERY    . GIVENS CAPSULE STUDY  2009   normal   . HEMICOLECTOMY  2008  . PERIPHERAL VASCULAR CATHETERIZATION N/A 03/03/2016   Procedure: Fistulagram;  Surgeon: Angelia Mould, MD;  Location: [place] CV LAB;  Service: Cardiovascular;  Laterality: N/A;  . PERIPHERAL VASCULAR CATHETERIZATION Left 03/03/2016   Procedure: Peripheral Vascular Balloon Angioplasty;  Surgeon: Angelia Mould, MD;  Location: [place] CV LAB;  Service: Cardiovascular;  Laterality: Left;  arm fistula pta    OB History    No data available       Home Medications    Prior to Admission medications   Medication Sig Start Date End Date Taking? Authorizing Provider  diltiazem (CARDIZEM CD) 120 MG 24 hr capsule Take 1 capsule (120 mg total) by mouth 2 (two) times daily. 01/30/17   Rosita Fire, MD  multivitamin (RENA-VIT) TABS tablet  Take 1 tablet by mouth daily.    [provider]  pantoprazole (PROTONIX) 40 MG tablet Take 1 tablet (40 mg total) by mouth daily. 01/30/17   Rosita Fire, MD  sevelamer carbonate (RENVELA) 800 MG tablet Take 3,200 mg by mouth 3 (three) times daily with meals. & 1600 by mouth with snacks    [provider]  TRADJENTA 5 MG TABS tablet Take 1 tablet by mouth daily. 02/06/17   [provider]  traMADol (ULTRAM) 50 MG tablet Take 50 mg by mouth 2 (two) times daily.    [provider]  warfarin  (COUMADIN) 3 MG tablet Take 1 tablet by mouth daily. 02/05/17   [provider]    Family History Family History  Problem Relation Age of Onset  . Diabetes Mother   . Hypertension Mother   . Hyperlipidemia Daughter   . Varicose Veins Daughter   . Cancer Son   . Diabetes Son   . Heart disease Son   . Hyperlipidemia Son     Social History Social History  Substance Use Topics  . Smoking status: Former Smoker    Packs/day: 1.00    Years: 10.00    Types: Cigarettes    Quit date: 04/12/1985  . Smokeless tobacco: Never Used  . Alcohol use No     Allergies   Patient has no known allergies.   Review of Systems Review of Systems  Constitutional: Negative for appetite change, chills, diaphoresis, fatigue and fever.  HENT: Negative for mouth sores, sore throat and trouble swallowing.   Eyes: Negative for visual disturbance.  Respiratory: Negative for cough, chest tightness, shortness of breath and wheezing.   Cardiovascular: Negative for chest pain.  Gastrointestinal: Negative for abdominal distention, abdominal pain, diarrhea, nausea and vomiting.  Endocrine: Negative for polydipsia, polyphagia and polyuria.  Genitourinary: Negative for dysuria, frequency and hematuria.  Musculoskeletal: Negative for gait problem.  Skin: Negative for color change, pallor and rash.  Neurological: Negative for dizziness, syncope, light-headedness and headaches.  Hematological: Does not bruise/bleed easily.  Psychiatric/Behavioral: Negative for behavioral problems and confusion.     Physical Exam Updated Vital Signs BP (!) 160/77 (BP Location: Right Arm)   Pulse 86   Temp 98.5 F (36.9 C) (Oral)   Resp 18   Ht 5\' 4"  (1.626 m)   Wt 54.5 kg (120 lb 3.2 oz)   SpO2 99%   BMI 20.63 kg/m   Physical Exam  Constitutional: She is oriented to person, place, and time. She appears well-developed and well-nourished. No distress.  HENT:  Head: Normocephalic.  Eyes: Conjunctivae are  normal. Pupils are equal, round, and reactive to light. No scleral icterus.  Neck: Normal range of motion. Neck supple. No thyromegaly present.  Cardiovascular: Normal rate and regular rhythm.  Exam reveals no gallop and no friction rub.   No murmur heard. Pulmonary/Chest: Effort normal and breath sounds normal. No respiratory distress. She has no wheezes. She has no rales.  Abdominal: Soft. Bowel sounds are normal. She exhibits no distension. There is no tenderness. There is no rebound.  Musculoskeletal: Normal range of motion.  Neurological: She is alert and oriented to person, place, and time.  Skin: Skin is warm and dry. No rash noted.  Skin, conjunctiva, nailbeds not pale. No bruising noted.  Psychiatric: She has a normal mood and affect. Her behavior is normal.     ED Treatments / Results  Labs (all labs ordered are listed, but only abnormal results are displayed) Labs  Reviewed - No data to display  EKG  EKG Interpretation None       Radiology No results found.  Procedures Procedures (including critical care time)  Medications Ordered in ED Medications  phytonadione (VITAMIN K) tablet 5 mg (5 mg Oral Given 02/20/17 1637)     Initial Impression / Assessment and Plan / ED Course  I have reviewed the triage vital signs and the nursing notes.  Pertinent labs & imaging results that were available during my care of the patient were reviewed by me and considered in my medical decision making (see chart for details).     Given vitamin K 5 mg by mouth. We will hold coumadin x 48 hours. Restart 2.5 mg on Sunday. Has scheduled lab draw Monday.   Final Clinical Impressions(s) / ED Diagnoses   Final diagnoses:  Coagulopathy [facility])    New Prescriptions New Prescriptions   No medications on file     Tanna Furry, MD 02/20/17 [phone]

## 2017-02-20 NOTE — Discharge Instructions (Signed)
Do NOT take coumadin today (Friday) or tomorrow. Start back on 2.5mg  on Sunday and Monday. Blood test on Monday will determine what dosage you need to stay on after that.

## 2017-02-21 DIAGNOSIS — N186 End stage renal disease: Secondary | ICD-10-CM | POA: Diagnosis not present

## 2017-02-21 DIAGNOSIS — Z992 Dependence on renal dialysis: Secondary | ICD-10-CM | POA: Diagnosis not present

## 2017-02-21 DIAGNOSIS — N2581 Secondary hyperparathyroidism of renal origin: Secondary | ICD-10-CM | POA: Diagnosis not present

## 2017-02-21 DIAGNOSIS — D631 Anemia in chronic kidney disease: Secondary | ICD-10-CM | POA: Diagnosis not present

## 2017-02-21 DIAGNOSIS — D509 Iron deficiency anemia, unspecified: Secondary | ICD-10-CM | POA: Diagnosis not present

## 2017-02-23 DIAGNOSIS — N186 End stage renal disease: Secondary | ICD-10-CM | POA: Diagnosis not present

## 2017-02-23 DIAGNOSIS — I120 Hypertensive chronic kidney disease with stage 5 chronic kidney disease or end stage renal disease: Secondary | ICD-10-CM | POA: Diagnosis not present

## 2017-02-23 DIAGNOSIS — I4891 Unspecified atrial fibrillation: Secondary | ICD-10-CM | POA: Diagnosis not present

## 2017-02-23 DIAGNOSIS — I248 Other forms of acute ischemic heart disease: Secondary | ICD-10-CM | POA: Diagnosis not present

## 2017-02-23 DIAGNOSIS — E1122 Type 2 diabetes mellitus with diabetic chronic kidney disease: Secondary | ICD-10-CM | POA: Diagnosis not present

## 2017-02-23 DIAGNOSIS — D631 Anemia in chronic kidney disease: Secondary | ICD-10-CM | POA: Diagnosis not present

## 2017-02-24 DIAGNOSIS — E119 Type 2 diabetes mellitus without complications: Secondary | ICD-10-CM | POA: Diagnosis not present

## 2017-02-24 DIAGNOSIS — D631 Anemia in chronic kidney disease: Secondary | ICD-10-CM | POA: Diagnosis not present

## 2017-02-24 DIAGNOSIS — Z992 Dependence on renal dialysis: Secondary | ICD-10-CM | POA: Diagnosis not present

## 2017-02-24 DIAGNOSIS — N2581 Secondary hyperparathyroidism of renal origin: Secondary | ICD-10-CM | POA: Diagnosis not present

## 2017-02-24 DIAGNOSIS — D509 Iron deficiency anemia, unspecified: Secondary | ICD-10-CM | POA: Diagnosis not present

## 2017-02-24 DIAGNOSIS — N186 End stage renal disease: Secondary | ICD-10-CM | POA: Diagnosis not present

## 2017-02-26 DIAGNOSIS — Z992 Dependence on renal dialysis: Secondary | ICD-10-CM | POA: Diagnosis not present

## 2017-02-26 DIAGNOSIS — N2581 Secondary hyperparathyroidism of renal origin: Secondary | ICD-10-CM | POA: Diagnosis not present

## 2017-02-26 DIAGNOSIS — N186 End stage renal disease: Secondary | ICD-10-CM | POA: Diagnosis not present

## 2017-02-26 DIAGNOSIS — D509 Iron deficiency anemia, unspecified: Secondary | ICD-10-CM | POA: Diagnosis not present

## 2017-02-26 DIAGNOSIS — D631 Anemia in chronic kidney disease: Secondary | ICD-10-CM | POA: Diagnosis not present

## 2017-02-27 DIAGNOSIS — I120 Hypertensive chronic kidney disease with stage 5 chronic kidney disease or end stage renal disease: Secondary | ICD-10-CM | POA: Diagnosis not present

## 2017-02-27 DIAGNOSIS — I248 Other forms of acute ischemic heart disease: Secondary | ICD-10-CM | POA: Diagnosis not present

## 2017-02-27 DIAGNOSIS — E1122 Type 2 diabetes mellitus with diabetic chronic kidney disease: Secondary | ICD-10-CM | POA: Diagnosis not present

## 2017-02-27 DIAGNOSIS — I4891 Unspecified atrial fibrillation: Secondary | ICD-10-CM | POA: Diagnosis not present

## 2017-02-27 DIAGNOSIS — D631 Anemia in chronic kidney disease: Secondary | ICD-10-CM | POA: Diagnosis not present

## 2017-02-27 DIAGNOSIS — N186 End stage renal disease: Secondary | ICD-10-CM | POA: Diagnosis not present

## 2017-02-28 DIAGNOSIS — Z992 Dependence on renal dialysis: Secondary | ICD-10-CM | POA: Diagnosis not present

## 2017-02-28 DIAGNOSIS — N186 End stage renal disease: Secondary | ICD-10-CM | POA: Diagnosis not present

## 2017-02-28 DIAGNOSIS — D631 Anemia in chronic kidney disease: Secondary | ICD-10-CM | POA: Diagnosis not present

## 2017-02-28 DIAGNOSIS — N2581 Secondary hyperparathyroidism of renal origin: Secondary | ICD-10-CM | POA: Diagnosis not present

## 2017-02-28 DIAGNOSIS — D509 Iron deficiency anemia, unspecified: Secondary | ICD-10-CM | POA: Diagnosis not present

## 2017-03-03 DIAGNOSIS — N186 End stage renal disease: Secondary | ICD-10-CM | POA: Diagnosis not present

## 2017-03-03 DIAGNOSIS — N2581 Secondary hyperparathyroidism of renal origin: Secondary | ICD-10-CM | POA: Diagnosis not present

## 2017-03-03 DIAGNOSIS — D631 Anemia in chronic kidney disease: Secondary | ICD-10-CM | POA: Diagnosis not present

## 2017-03-03 DIAGNOSIS — Z992 Dependence on renal dialysis: Secondary | ICD-10-CM | POA: Diagnosis not present

## 2017-03-03 DIAGNOSIS — D509 Iron deficiency anemia, unspecified: Secondary | ICD-10-CM | POA: Diagnosis not present

## 2017-03-04 DIAGNOSIS — I4891 Unspecified atrial fibrillation: Secondary | ICD-10-CM | POA: Diagnosis not present

## 2017-03-04 DIAGNOSIS — I120 Hypertensive chronic kidney disease with stage 5 chronic kidney disease or end stage renal disease: Secondary | ICD-10-CM | POA: Diagnosis not present

## 2017-03-04 DIAGNOSIS — N186 End stage renal disease: Secondary | ICD-10-CM | POA: Diagnosis not present

## 2017-03-04 DIAGNOSIS — I248 Other forms of acute ischemic heart disease: Secondary | ICD-10-CM | POA: Diagnosis not present

## 2017-03-04 DIAGNOSIS — E1122 Type 2 diabetes mellitus with diabetic chronic kidney disease: Secondary | ICD-10-CM | POA: Diagnosis not present

## 2017-03-04 DIAGNOSIS — D631 Anemia in chronic kidney disease: Secondary | ICD-10-CM | POA: Diagnosis not present

## 2017-03-05 DIAGNOSIS — Z992 Dependence on renal dialysis: Secondary | ICD-10-CM | POA: Diagnosis not present

## 2017-03-05 DIAGNOSIS — N2581 Secondary hyperparathyroidism of renal origin: Secondary | ICD-10-CM | POA: Diagnosis not present

## 2017-03-05 DIAGNOSIS — D631 Anemia in chronic kidney disease: Secondary | ICD-10-CM | POA: Diagnosis not present

## 2017-03-05 DIAGNOSIS — N186 End stage renal disease: Secondary | ICD-10-CM | POA: Diagnosis not present

## 2017-03-05 DIAGNOSIS — D509 Iron deficiency anemia, unspecified: Secondary | ICD-10-CM | POA: Diagnosis not present

## 2017-03-09 ENCOUNTER — Ambulatory Visit (INDEPENDENT_AMBULATORY_CARE_PROVIDER_SITE_OTHER): Payer: Medicare Other | Admitting: *Deleted

## 2017-03-09 DIAGNOSIS — Z992 Dependence on renal dialysis: Secondary | ICD-10-CM | POA: Diagnosis not present

## 2017-03-09 DIAGNOSIS — E1122 Type 2 diabetes mellitus with diabetic chronic kidney disease: Secondary | ICD-10-CM | POA: Diagnosis not present

## 2017-03-09 DIAGNOSIS — I10 Essential (primary) hypertension: Secondary | ICD-10-CM | POA: Diagnosis not present

## 2017-03-09 DIAGNOSIS — M532X1 Spinal instabilities, occipito-atlanto-axial region: Secondary | ICD-10-CM | POA: Diagnosis not present

## 2017-03-09 DIAGNOSIS — I120 Hypertensive chronic kidney disease with stage 5 chronic kidney disease or end stage renal disease: Secondary | ICD-10-CM | POA: Diagnosis not present

## 2017-03-09 DIAGNOSIS — Z7901 Long term (current) use of anticoagulants: Secondary | ICD-10-CM

## 2017-03-09 DIAGNOSIS — N2581 Secondary hyperparathyroidism of renal origin: Secondary | ICD-10-CM | POA: Diagnosis not present

## 2017-03-09 DIAGNOSIS — N186 End stage renal disease: Secondary | ICD-10-CM | POA: Diagnosis not present

## 2017-03-09 DIAGNOSIS — M4802 Spinal stenosis, cervical region: Secondary | ICD-10-CM | POA: Diagnosis not present

## 2017-03-09 DIAGNOSIS — I248 Other forms of acute ischemic heart disease: Secondary | ICD-10-CM | POA: Diagnosis not present

## 2017-03-09 DIAGNOSIS — M5412 Radiculopathy, cervical region: Secondary | ICD-10-CM | POA: Diagnosis not present

## 2017-03-09 DIAGNOSIS — D509 Iron deficiency anemia, unspecified: Secondary | ICD-10-CM | POA: Diagnosis not present

## 2017-03-09 DIAGNOSIS — D631 Anemia in chronic kidney disease: Secondary | ICD-10-CM | POA: Diagnosis not present

## 2017-03-09 DIAGNOSIS — I4891 Unspecified atrial fibrillation: Secondary | ICD-10-CM

## 2017-03-09 LAB — POCT INR: INR: 4.3

## 2017-03-10 DIAGNOSIS — D631 Anemia in chronic kidney disease: Secondary | ICD-10-CM | POA: Diagnosis not present

## 2017-03-10 DIAGNOSIS — N2581 Secondary hyperparathyroidism of renal origin: Secondary | ICD-10-CM | POA: Diagnosis not present

## 2017-03-10 DIAGNOSIS — D509 Iron deficiency anemia, unspecified: Secondary | ICD-10-CM | POA: Diagnosis not present

## 2017-03-10 DIAGNOSIS — Z992 Dependence on renal dialysis: Secondary | ICD-10-CM | POA: Diagnosis not present

## 2017-03-10 DIAGNOSIS — N186 End stage renal disease: Secondary | ICD-10-CM | POA: Diagnosis not present

## 2017-03-12 DIAGNOSIS — D509 Iron deficiency anemia, unspecified: Secondary | ICD-10-CM | POA: Diagnosis not present

## 2017-03-12 DIAGNOSIS — D631 Anemia in chronic kidney disease: Secondary | ICD-10-CM | POA: Diagnosis not present

## 2017-03-12 DIAGNOSIS — Z992 Dependence on renal dialysis: Secondary | ICD-10-CM | POA: Diagnosis not present

## 2017-03-12 DIAGNOSIS — N2581 Secondary hyperparathyroidism of renal origin: Secondary | ICD-10-CM | POA: Diagnosis not present

## 2017-03-12 DIAGNOSIS — N186 End stage renal disease: Secondary | ICD-10-CM | POA: Diagnosis not present

## 2017-03-14 DIAGNOSIS — N2581 Secondary hyperparathyroidism of renal origin: Secondary | ICD-10-CM | POA: Diagnosis not present

## 2017-03-14 DIAGNOSIS — D631 Anemia in chronic kidney disease: Secondary | ICD-10-CM | POA: Diagnosis not present

## 2017-03-14 DIAGNOSIS — Z992 Dependence on renal dialysis: Secondary | ICD-10-CM | POA: Diagnosis not present

## 2017-03-14 DIAGNOSIS — D509 Iron deficiency anemia, unspecified: Secondary | ICD-10-CM | POA: Diagnosis not present

## 2017-03-14 DIAGNOSIS — N186 End stage renal disease: Secondary | ICD-10-CM | POA: Diagnosis not present

## 2017-03-16 ENCOUNTER — Telehealth: Payer: Self-pay | Admitting: *Deleted

## 2017-03-16 ENCOUNTER — Ambulatory Visit (INDEPENDENT_AMBULATORY_CARE_PROVIDER_SITE_OTHER): Payer: Medicare Other | Admitting: *Deleted

## 2017-03-16 DIAGNOSIS — Z7901 Long term (current) use of anticoagulants: Secondary | ICD-10-CM

## 2017-03-16 DIAGNOSIS — E1122 Type 2 diabetes mellitus with diabetic chronic kidney disease: Secondary | ICD-10-CM | POA: Diagnosis not present

## 2017-03-16 DIAGNOSIS — I4891 Unspecified atrial fibrillation: Secondary | ICD-10-CM

## 2017-03-16 DIAGNOSIS — I248 Other forms of acute ischemic heart disease: Secondary | ICD-10-CM | POA: Diagnosis not present

## 2017-03-16 DIAGNOSIS — N186 End stage renal disease: Secondary | ICD-10-CM | POA: Diagnosis not present

## 2017-03-16 DIAGNOSIS — I120 Hypertensive chronic kidney disease with stage 5 chronic kidney disease or end stage renal disease: Secondary | ICD-10-CM | POA: Diagnosis not present

## 2017-03-16 DIAGNOSIS — D631 Anemia in chronic kidney disease: Secondary | ICD-10-CM | POA: Diagnosis not present

## 2017-03-16 LAB — POCT INR: INR: 2

## 2017-03-16 NOTE — Telephone Encounter (Signed)
Done.  See coumadin note. 

## 2017-03-16 NOTE — Telephone Encounter (Signed)
INR - 2.0 / please call w/ instructions / tg

## 2017-03-17 DIAGNOSIS — Z992 Dependence on renal dialysis: Secondary | ICD-10-CM | POA: Diagnosis not present

## 2017-03-17 DIAGNOSIS — D509 Iron deficiency anemia, unspecified: Secondary | ICD-10-CM | POA: Diagnosis not present

## 2017-03-17 DIAGNOSIS — N186 End stage renal disease: Secondary | ICD-10-CM | POA: Diagnosis not present

## 2017-03-17 DIAGNOSIS — D631 Anemia in chronic kidney disease: Secondary | ICD-10-CM | POA: Diagnosis not present

## 2017-03-17 DIAGNOSIS — N2581 Secondary hyperparathyroidism of renal origin: Secondary | ICD-10-CM | POA: Diagnosis not present

## 2017-03-19 DIAGNOSIS — D509 Iron deficiency anemia, unspecified: Secondary | ICD-10-CM | POA: Diagnosis not present

## 2017-03-19 DIAGNOSIS — Z992 Dependence on renal dialysis: Secondary | ICD-10-CM | POA: Diagnosis not present

## 2017-03-19 DIAGNOSIS — N2581 Secondary hyperparathyroidism of renal origin: Secondary | ICD-10-CM | POA: Diagnosis not present

## 2017-03-19 DIAGNOSIS — N186 End stage renal disease: Secondary | ICD-10-CM | POA: Diagnosis not present

## 2017-03-19 DIAGNOSIS — D631 Anemia in chronic kidney disease: Secondary | ICD-10-CM | POA: Diagnosis not present

## 2017-03-21 DIAGNOSIS — Z992 Dependence on renal dialysis: Secondary | ICD-10-CM | POA: Diagnosis not present

## 2017-03-21 DIAGNOSIS — N186 End stage renal disease: Secondary | ICD-10-CM | POA: Diagnosis not present

## 2017-03-21 DIAGNOSIS — N2581 Secondary hyperparathyroidism of renal origin: Secondary | ICD-10-CM | POA: Diagnosis not present

## 2017-03-21 DIAGNOSIS — D509 Iron deficiency anemia, unspecified: Secondary | ICD-10-CM | POA: Diagnosis not present

## 2017-03-21 DIAGNOSIS — D631 Anemia in chronic kidney disease: Secondary | ICD-10-CM | POA: Diagnosis not present

## 2017-03-23 ENCOUNTER — Ambulatory Visit (INDEPENDENT_AMBULATORY_CARE_PROVIDER_SITE_OTHER): Payer: Medicare Other | Admitting: *Deleted

## 2017-03-23 DIAGNOSIS — E1122 Type 2 diabetes mellitus with diabetic chronic kidney disease: Secondary | ICD-10-CM | POA: Diagnosis not present

## 2017-03-23 DIAGNOSIS — I120 Hypertensive chronic kidney disease with stage 5 chronic kidney disease or end stage renal disease: Secondary | ICD-10-CM | POA: Diagnosis not present

## 2017-03-23 DIAGNOSIS — I4891 Unspecified atrial fibrillation: Secondary | ICD-10-CM | POA: Diagnosis not present

## 2017-03-23 DIAGNOSIS — Z7901 Long term (current) use of anticoagulants: Secondary | ICD-10-CM

## 2017-03-23 DIAGNOSIS — I248 Other forms of acute ischemic heart disease: Secondary | ICD-10-CM | POA: Diagnosis not present

## 2017-03-23 DIAGNOSIS — N186 End stage renal disease: Secondary | ICD-10-CM | POA: Diagnosis not present

## 2017-03-23 DIAGNOSIS — D631 Anemia in chronic kidney disease: Secondary | ICD-10-CM | POA: Diagnosis not present

## 2017-03-23 LAB — POCT INR: INR: 2.1

## 2017-03-24 DIAGNOSIS — N186 End stage renal disease: Secondary | ICD-10-CM | POA: Diagnosis not present

## 2017-03-24 DIAGNOSIS — D631 Anemia in chronic kidney disease: Secondary | ICD-10-CM | POA: Diagnosis not present

## 2017-03-24 DIAGNOSIS — Z992 Dependence on renal dialysis: Secondary | ICD-10-CM | POA: Diagnosis not present

## 2017-03-24 DIAGNOSIS — N2581 Secondary hyperparathyroidism of renal origin: Secondary | ICD-10-CM | POA: Diagnosis not present

## 2017-03-24 DIAGNOSIS — D509 Iron deficiency anemia, unspecified: Secondary | ICD-10-CM | POA: Diagnosis not present

## 2017-03-26 DIAGNOSIS — Z992 Dependence on renal dialysis: Secondary | ICD-10-CM | POA: Diagnosis not present

## 2017-03-26 DIAGNOSIS — N2581 Secondary hyperparathyroidism of renal origin: Secondary | ICD-10-CM | POA: Diagnosis not present

## 2017-03-26 DIAGNOSIS — D509 Iron deficiency anemia, unspecified: Secondary | ICD-10-CM | POA: Diagnosis not present

## 2017-03-26 DIAGNOSIS — N186 End stage renal disease: Secondary | ICD-10-CM | POA: Diagnosis not present

## 2017-03-26 DIAGNOSIS — D631 Anemia in chronic kidney disease: Secondary | ICD-10-CM | POA: Diagnosis not present

## 2017-03-28 DIAGNOSIS — Z992 Dependence on renal dialysis: Secondary | ICD-10-CM | POA: Diagnosis not present

## 2017-03-28 DIAGNOSIS — N186 End stage renal disease: Secondary | ICD-10-CM | POA: Diagnosis not present

## 2017-03-28 DIAGNOSIS — N2581 Secondary hyperparathyroidism of renal origin: Secondary | ICD-10-CM | POA: Diagnosis not present

## 2017-03-28 DIAGNOSIS — D631 Anemia in chronic kidney disease: Secondary | ICD-10-CM | POA: Diagnosis not present

## 2017-03-28 DIAGNOSIS — D509 Iron deficiency anemia, unspecified: Secondary | ICD-10-CM | POA: Diagnosis not present

## 2017-03-31 ENCOUNTER — Ambulatory Visit (INDEPENDENT_AMBULATORY_CARE_PROVIDER_SITE_OTHER): Payer: Medicare Other | Admitting: *Deleted

## 2017-03-31 DIAGNOSIS — Z992 Dependence on renal dialysis: Secondary | ICD-10-CM | POA: Diagnosis not present

## 2017-03-31 DIAGNOSIS — N2581 Secondary hyperparathyroidism of renal origin: Secondary | ICD-10-CM | POA: Diagnosis not present

## 2017-03-31 DIAGNOSIS — I4891 Unspecified atrial fibrillation: Secondary | ICD-10-CM

## 2017-03-31 DIAGNOSIS — I120 Hypertensive chronic kidney disease with stage 5 chronic kidney disease or end stage renal disease: Secondary | ICD-10-CM | POA: Diagnosis not present

## 2017-03-31 DIAGNOSIS — I248 Other forms of acute ischemic heart disease: Secondary | ICD-10-CM | POA: Diagnosis not present

## 2017-03-31 DIAGNOSIS — E1122 Type 2 diabetes mellitus with diabetic chronic kidney disease: Secondary | ICD-10-CM | POA: Diagnosis not present

## 2017-03-31 DIAGNOSIS — D631 Anemia in chronic kidney disease: Secondary | ICD-10-CM | POA: Diagnosis not present

## 2017-03-31 DIAGNOSIS — N186 End stage renal disease: Secondary | ICD-10-CM | POA: Diagnosis not present

## 2017-03-31 DIAGNOSIS — D509 Iron deficiency anemia, unspecified: Secondary | ICD-10-CM | POA: Diagnosis not present

## 2017-03-31 DIAGNOSIS — Z7901 Long term (current) use of anticoagulants: Secondary | ICD-10-CM

## 2017-03-31 LAB — POCT INR: INR: 2.3

## 2017-04-02 DIAGNOSIS — D509 Iron deficiency anemia, unspecified: Secondary | ICD-10-CM | POA: Diagnosis not present

## 2017-04-02 DIAGNOSIS — N2581 Secondary hyperparathyroidism of renal origin: Secondary | ICD-10-CM | POA: Diagnosis not present

## 2017-04-02 DIAGNOSIS — Z992 Dependence on renal dialysis: Secondary | ICD-10-CM | POA: Diagnosis not present

## 2017-04-02 DIAGNOSIS — N186 End stage renal disease: Secondary | ICD-10-CM | POA: Diagnosis not present

## 2017-04-02 DIAGNOSIS — D631 Anemia in chronic kidney disease: Secondary | ICD-10-CM | POA: Diagnosis not present

## 2017-04-04 DIAGNOSIS — D631 Anemia in chronic kidney disease: Secondary | ICD-10-CM | POA: Diagnosis not present

## 2017-04-04 DIAGNOSIS — Z992 Dependence on renal dialysis: Secondary | ICD-10-CM | POA: Diagnosis not present

## 2017-04-04 DIAGNOSIS — N186 End stage renal disease: Secondary | ICD-10-CM | POA: Diagnosis not present

## 2017-04-04 DIAGNOSIS — D509 Iron deficiency anemia, unspecified: Secondary | ICD-10-CM | POA: Diagnosis not present

## 2017-04-04 DIAGNOSIS — N2581 Secondary hyperparathyroidism of renal origin: Secondary | ICD-10-CM | POA: Diagnosis not present

## 2017-04-07 DIAGNOSIS — Z992 Dependence on renal dialysis: Secondary | ICD-10-CM | POA: Diagnosis not present

## 2017-04-07 DIAGNOSIS — D631 Anemia in chronic kidney disease: Secondary | ICD-10-CM | POA: Diagnosis not present

## 2017-04-07 DIAGNOSIS — N186 End stage renal disease: Secondary | ICD-10-CM | POA: Diagnosis not present

## 2017-04-07 DIAGNOSIS — N2581 Secondary hyperparathyroidism of renal origin: Secondary | ICD-10-CM | POA: Diagnosis not present

## 2017-04-07 DIAGNOSIS — D509 Iron deficiency anemia, unspecified: Secondary | ICD-10-CM | POA: Diagnosis not present

## 2017-04-09 DIAGNOSIS — N2581 Secondary hyperparathyroidism of renal origin: Secondary | ICD-10-CM | POA: Diagnosis not present

## 2017-04-09 DIAGNOSIS — Z992 Dependence on renal dialysis: Secondary | ICD-10-CM | POA: Diagnosis not present

## 2017-04-09 DIAGNOSIS — D631 Anemia in chronic kidney disease: Secondary | ICD-10-CM | POA: Diagnosis not present

## 2017-04-09 DIAGNOSIS — D509 Iron deficiency anemia, unspecified: Secondary | ICD-10-CM | POA: Diagnosis not present

## 2017-04-09 DIAGNOSIS — N186 End stage renal disease: Secondary | ICD-10-CM | POA: Diagnosis not present

## 2017-04-11 DIAGNOSIS — Z992 Dependence on renal dialysis: Secondary | ICD-10-CM | POA: Diagnosis not present

## 2017-04-11 DIAGNOSIS — D631 Anemia in chronic kidney disease: Secondary | ICD-10-CM | POA: Diagnosis not present

## 2017-04-11 DIAGNOSIS — N2581 Secondary hyperparathyroidism of renal origin: Secondary | ICD-10-CM | POA: Diagnosis not present

## 2017-04-11 DIAGNOSIS — N186 End stage renal disease: Secondary | ICD-10-CM | POA: Diagnosis not present

## 2017-04-11 DIAGNOSIS — D509 Iron deficiency anemia, unspecified: Secondary | ICD-10-CM | POA: Diagnosis not present

## 2017-04-14 DIAGNOSIS — N186 End stage renal disease: Secondary | ICD-10-CM | POA: Diagnosis not present

## 2017-04-14 DIAGNOSIS — D509 Iron deficiency anemia, unspecified: Secondary | ICD-10-CM | POA: Diagnosis not present

## 2017-04-14 DIAGNOSIS — D631 Anemia in chronic kidney disease: Secondary | ICD-10-CM | POA: Diagnosis not present

## 2017-04-14 DIAGNOSIS — N2581 Secondary hyperparathyroidism of renal origin: Secondary | ICD-10-CM | POA: Diagnosis not present

## 2017-04-14 DIAGNOSIS — Z992 Dependence on renal dialysis: Secondary | ICD-10-CM | POA: Diagnosis not present

## 2017-04-16 ENCOUNTER — Ambulatory Visit: Payer: Medicare Other | Admitting: Gastroenterology

## 2017-04-16 DIAGNOSIS — D509 Iron deficiency anemia, unspecified: Secondary | ICD-10-CM | POA: Diagnosis not present

## 2017-04-16 DIAGNOSIS — N2581 Secondary hyperparathyroidism of renal origin: Secondary | ICD-10-CM | POA: Diagnosis not present

## 2017-04-16 DIAGNOSIS — D631 Anemia in chronic kidney disease: Secondary | ICD-10-CM | POA: Diagnosis not present

## 2017-04-16 DIAGNOSIS — N186 End stage renal disease: Secondary | ICD-10-CM | POA: Diagnosis not present

## 2017-04-16 DIAGNOSIS — Z992 Dependence on renal dialysis: Secondary | ICD-10-CM | POA: Diagnosis not present

## 2017-04-17 ENCOUNTER — Ambulatory Visit: Payer: Medicare Other | Admitting: Cardiovascular Disease

## 2017-04-17 DIAGNOSIS — N186 End stage renal disease: Secondary | ICD-10-CM | POA: Diagnosis not present

## 2017-04-17 DIAGNOSIS — Z992 Dependence on renal dialysis: Secondary | ICD-10-CM | POA: Diagnosis not present

## 2017-04-18 DIAGNOSIS — D631 Anemia in chronic kidney disease: Secondary | ICD-10-CM | POA: Diagnosis not present

## 2017-04-18 DIAGNOSIS — D509 Iron deficiency anemia, unspecified: Secondary | ICD-10-CM | POA: Diagnosis not present

## 2017-04-18 DIAGNOSIS — N186 End stage renal disease: Secondary | ICD-10-CM | POA: Diagnosis not present

## 2017-04-18 DIAGNOSIS — Z992 Dependence on renal dialysis: Secondary | ICD-10-CM | POA: Diagnosis not present

## 2017-04-18 DIAGNOSIS — N2581 Secondary hyperparathyroidism of renal origin: Secondary | ICD-10-CM | POA: Diagnosis not present

## 2017-04-21 DIAGNOSIS — D509 Iron deficiency anemia, unspecified: Secondary | ICD-10-CM | POA: Diagnosis not present

## 2017-04-21 DIAGNOSIS — N2581 Secondary hyperparathyroidism of renal origin: Secondary | ICD-10-CM | POA: Diagnosis not present

## 2017-04-21 DIAGNOSIS — D631 Anemia in chronic kidney disease: Secondary | ICD-10-CM | POA: Diagnosis not present

## 2017-04-21 DIAGNOSIS — Z992 Dependence on renal dialysis: Secondary | ICD-10-CM | POA: Diagnosis not present

## 2017-04-21 DIAGNOSIS — N186 End stage renal disease: Secondary | ICD-10-CM | POA: Diagnosis not present

## 2017-04-23 DIAGNOSIS — N186 End stage renal disease: Secondary | ICD-10-CM | POA: Diagnosis not present

## 2017-04-23 DIAGNOSIS — D509 Iron deficiency anemia, unspecified: Secondary | ICD-10-CM | POA: Diagnosis not present

## 2017-04-23 DIAGNOSIS — Z992 Dependence on renal dialysis: Secondary | ICD-10-CM | POA: Diagnosis not present

## 2017-04-23 DIAGNOSIS — N2581 Secondary hyperparathyroidism of renal origin: Secondary | ICD-10-CM | POA: Diagnosis not present

## 2017-04-23 DIAGNOSIS — D631 Anemia in chronic kidney disease: Secondary | ICD-10-CM | POA: Diagnosis not present

## 2017-04-25 DIAGNOSIS — N2581 Secondary hyperparathyroidism of renal origin: Secondary | ICD-10-CM | POA: Diagnosis not present

## 2017-04-25 DIAGNOSIS — D509 Iron deficiency anemia, unspecified: Secondary | ICD-10-CM | POA: Diagnosis not present

## 2017-04-25 DIAGNOSIS — N186 End stage renal disease: Secondary | ICD-10-CM | POA: Diagnosis not present

## 2017-04-25 DIAGNOSIS — Z992 Dependence on renal dialysis: Secondary | ICD-10-CM | POA: Diagnosis not present

## 2017-04-25 DIAGNOSIS — D631 Anemia in chronic kidney disease: Secondary | ICD-10-CM | POA: Diagnosis not present

## 2017-04-27 DIAGNOSIS — I4891 Unspecified atrial fibrillation: Secondary | ICD-10-CM | POA: Diagnosis not present

## 2017-04-27 DIAGNOSIS — N186 End stage renal disease: Secondary | ICD-10-CM | POA: Diagnosis not present

## 2017-04-27 DIAGNOSIS — E119 Type 2 diabetes mellitus without complications: Secondary | ICD-10-CM | POA: Diagnosis not present

## 2017-04-27 DIAGNOSIS — D649 Anemia, unspecified: Secondary | ICD-10-CM | POA: Diagnosis not present

## 2017-04-27 DIAGNOSIS — Z23 Encounter for immunization: Secondary | ICD-10-CM | POA: Diagnosis not present

## 2017-04-27 DIAGNOSIS — I10 Essential (primary) hypertension: Secondary | ICD-10-CM | POA: Diagnosis not present

## 2017-04-28 DIAGNOSIS — Z992 Dependence on renal dialysis: Secondary | ICD-10-CM | POA: Diagnosis not present

## 2017-04-28 DIAGNOSIS — N186 End stage renal disease: Secondary | ICD-10-CM | POA: Diagnosis not present

## 2017-04-28 DIAGNOSIS — D631 Anemia in chronic kidney disease: Secondary | ICD-10-CM | POA: Diagnosis not present

## 2017-04-28 DIAGNOSIS — N2581 Secondary hyperparathyroidism of renal origin: Secondary | ICD-10-CM | POA: Diagnosis not present

## 2017-04-28 DIAGNOSIS — D509 Iron deficiency anemia, unspecified: Secondary | ICD-10-CM | POA: Diagnosis not present

## 2017-04-30 DIAGNOSIS — D631 Anemia in chronic kidney disease: Secondary | ICD-10-CM | POA: Diagnosis not present

## 2017-04-30 DIAGNOSIS — N186 End stage renal disease: Secondary | ICD-10-CM | POA: Diagnosis not present

## 2017-04-30 DIAGNOSIS — D509 Iron deficiency anemia, unspecified: Secondary | ICD-10-CM | POA: Diagnosis not present

## 2017-04-30 DIAGNOSIS — Z992 Dependence on renal dialysis: Secondary | ICD-10-CM | POA: Diagnosis not present

## 2017-04-30 DIAGNOSIS — N2581 Secondary hyperparathyroidism of renal origin: Secondary | ICD-10-CM | POA: Diagnosis not present

## 2017-05-02 DIAGNOSIS — Z992 Dependence on renal dialysis: Secondary | ICD-10-CM | POA: Diagnosis not present

## 2017-05-02 DIAGNOSIS — N186 End stage renal disease: Secondary | ICD-10-CM | POA: Diagnosis not present

## 2017-05-02 DIAGNOSIS — D631 Anemia in chronic kidney disease: Secondary | ICD-10-CM | POA: Diagnosis not present

## 2017-05-02 DIAGNOSIS — D509 Iron deficiency anemia, unspecified: Secondary | ICD-10-CM | POA: Diagnosis not present

## 2017-05-02 DIAGNOSIS — N2581 Secondary hyperparathyroidism of renal origin: Secondary | ICD-10-CM | POA: Diagnosis not present

## 2017-05-04 ENCOUNTER — Encounter: Payer: Self-pay | Admitting: *Deleted

## 2017-05-05 DIAGNOSIS — D509 Iron deficiency anemia, unspecified: Secondary | ICD-10-CM | POA: Diagnosis not present

## 2017-05-05 DIAGNOSIS — N2581 Secondary hyperparathyroidism of renal origin: Secondary | ICD-10-CM | POA: Diagnosis not present

## 2017-05-05 DIAGNOSIS — N186 End stage renal disease: Secondary | ICD-10-CM | POA: Diagnosis not present

## 2017-05-05 DIAGNOSIS — D631 Anemia in chronic kidney disease: Secondary | ICD-10-CM | POA: Diagnosis not present

## 2017-05-05 DIAGNOSIS — Z992 Dependence on renal dialysis: Secondary | ICD-10-CM | POA: Diagnosis not present

## 2017-05-07 DIAGNOSIS — D631 Anemia in chronic kidney disease: Secondary | ICD-10-CM | POA: Diagnosis not present

## 2017-05-07 DIAGNOSIS — Z992 Dependence on renal dialysis: Secondary | ICD-10-CM | POA: Diagnosis not present

## 2017-05-07 DIAGNOSIS — N2581 Secondary hyperparathyroidism of renal origin: Secondary | ICD-10-CM | POA: Diagnosis not present

## 2017-05-07 DIAGNOSIS — D509 Iron deficiency anemia, unspecified: Secondary | ICD-10-CM | POA: Diagnosis not present

## 2017-05-07 DIAGNOSIS — N186 End stage renal disease: Secondary | ICD-10-CM | POA: Diagnosis not present

## 2017-05-09 DIAGNOSIS — N2581 Secondary hyperparathyroidism of renal origin: Secondary | ICD-10-CM | POA: Diagnosis not present

## 2017-05-09 DIAGNOSIS — N186 End stage renal disease: Secondary | ICD-10-CM | POA: Diagnosis not present

## 2017-05-09 DIAGNOSIS — Z992 Dependence on renal dialysis: Secondary | ICD-10-CM | POA: Diagnosis not present

## 2017-05-09 DIAGNOSIS — D509 Iron deficiency anemia, unspecified: Secondary | ICD-10-CM | POA: Diagnosis not present

## 2017-05-09 DIAGNOSIS — D631 Anemia in chronic kidney disease: Secondary | ICD-10-CM | POA: Diagnosis not present

## 2017-05-12 DIAGNOSIS — Z992 Dependence on renal dialysis: Secondary | ICD-10-CM | POA: Diagnosis not present

## 2017-05-12 DIAGNOSIS — N2581 Secondary hyperparathyroidism of renal origin: Secondary | ICD-10-CM | POA: Diagnosis not present

## 2017-05-12 DIAGNOSIS — D509 Iron deficiency anemia, unspecified: Secondary | ICD-10-CM | POA: Diagnosis not present

## 2017-05-12 DIAGNOSIS — N186 End stage renal disease: Secondary | ICD-10-CM | POA: Diagnosis not present

## 2017-05-12 DIAGNOSIS — D631 Anemia in chronic kidney disease: Secondary | ICD-10-CM | POA: Diagnosis not present

## 2017-05-14 DIAGNOSIS — N186 End stage renal disease: Secondary | ICD-10-CM | POA: Diagnosis not present

## 2017-05-14 DIAGNOSIS — D509 Iron deficiency anemia, unspecified: Secondary | ICD-10-CM | POA: Diagnosis not present

## 2017-05-14 DIAGNOSIS — N2581 Secondary hyperparathyroidism of renal origin: Secondary | ICD-10-CM | POA: Diagnosis not present

## 2017-05-14 DIAGNOSIS — Z992 Dependence on renal dialysis: Secondary | ICD-10-CM | POA: Diagnosis not present

## 2017-05-14 DIAGNOSIS — D631 Anemia in chronic kidney disease: Secondary | ICD-10-CM | POA: Diagnosis not present

## 2017-05-16 DIAGNOSIS — D509 Iron deficiency anemia, unspecified: Secondary | ICD-10-CM | POA: Diagnosis not present

## 2017-05-16 DIAGNOSIS — D631 Anemia in chronic kidney disease: Secondary | ICD-10-CM | POA: Diagnosis not present

## 2017-05-16 DIAGNOSIS — N186 End stage renal disease: Secondary | ICD-10-CM | POA: Diagnosis not present

## 2017-05-16 DIAGNOSIS — Z992 Dependence on renal dialysis: Secondary | ICD-10-CM | POA: Diagnosis not present

## 2017-05-16 DIAGNOSIS — N2581 Secondary hyperparathyroidism of renal origin: Secondary | ICD-10-CM | POA: Diagnosis not present

## 2017-05-17 DIAGNOSIS — Z992 Dependence on renal dialysis: Secondary | ICD-10-CM | POA: Diagnosis not present

## 2017-05-17 DIAGNOSIS — N186 End stage renal disease: Secondary | ICD-10-CM | POA: Diagnosis not present

## 2017-05-19 DIAGNOSIS — D509 Iron deficiency anemia, unspecified: Secondary | ICD-10-CM | POA: Diagnosis not present

## 2017-05-19 DIAGNOSIS — D631 Anemia in chronic kidney disease: Secondary | ICD-10-CM | POA: Diagnosis not present

## 2017-05-19 DIAGNOSIS — N186 End stage renal disease: Secondary | ICD-10-CM | POA: Diagnosis not present

## 2017-05-19 DIAGNOSIS — Z992 Dependence on renal dialysis: Secondary | ICD-10-CM | POA: Diagnosis not present

## 2017-05-19 DIAGNOSIS — N2581 Secondary hyperparathyroidism of renal origin: Secondary | ICD-10-CM | POA: Diagnosis not present

## 2017-05-21 DIAGNOSIS — N2581 Secondary hyperparathyroidism of renal origin: Secondary | ICD-10-CM | POA: Diagnosis not present

## 2017-05-21 DIAGNOSIS — N186 End stage renal disease: Secondary | ICD-10-CM | POA: Diagnosis not present

## 2017-05-21 DIAGNOSIS — D631 Anemia in chronic kidney disease: Secondary | ICD-10-CM | POA: Diagnosis not present

## 2017-05-21 DIAGNOSIS — D509 Iron deficiency anemia, unspecified: Secondary | ICD-10-CM | POA: Diagnosis not present

## 2017-05-21 DIAGNOSIS — Z992 Dependence on renal dialysis: Secondary | ICD-10-CM | POA: Diagnosis not present

## 2017-05-23 DIAGNOSIS — N2581 Secondary hyperparathyroidism of renal origin: Secondary | ICD-10-CM | POA: Diagnosis not present

## 2017-05-23 DIAGNOSIS — D509 Iron deficiency anemia, unspecified: Secondary | ICD-10-CM | POA: Diagnosis not present

## 2017-05-23 DIAGNOSIS — D631 Anemia in chronic kidney disease: Secondary | ICD-10-CM | POA: Diagnosis not present

## 2017-05-23 DIAGNOSIS — N186 End stage renal disease: Secondary | ICD-10-CM | POA: Diagnosis not present

## 2017-05-23 DIAGNOSIS — Z992 Dependence on renal dialysis: Secondary | ICD-10-CM | POA: Diagnosis not present

## 2017-05-25 ENCOUNTER — Ambulatory Visit (INDEPENDENT_AMBULATORY_CARE_PROVIDER_SITE_OTHER): Payer: Medicare Other | Admitting: *Deleted

## 2017-05-25 DIAGNOSIS — Z5181 Encounter for therapeutic drug level monitoring: Secondary | ICD-10-CM

## 2017-05-25 DIAGNOSIS — I4891 Unspecified atrial fibrillation: Secondary | ICD-10-CM | POA: Diagnosis not present

## 2017-05-25 DIAGNOSIS — Z7901 Long term (current) use of anticoagulants: Secondary | ICD-10-CM | POA: Diagnosis not present

## 2017-05-25 LAB — POCT INR: INR: 1.2

## 2017-05-26 DIAGNOSIS — Z992 Dependence on renal dialysis: Secondary | ICD-10-CM | POA: Diagnosis not present

## 2017-05-26 DIAGNOSIS — D631 Anemia in chronic kidney disease: Secondary | ICD-10-CM | POA: Diagnosis not present

## 2017-05-26 DIAGNOSIS — N186 End stage renal disease: Secondary | ICD-10-CM | POA: Diagnosis not present

## 2017-05-26 DIAGNOSIS — N2581 Secondary hyperparathyroidism of renal origin: Secondary | ICD-10-CM | POA: Diagnosis not present

## 2017-05-26 DIAGNOSIS — D509 Iron deficiency anemia, unspecified: Secondary | ICD-10-CM | POA: Diagnosis not present

## 2017-05-26 DIAGNOSIS — E119 Type 2 diabetes mellitus without complications: Secondary | ICD-10-CM | POA: Diagnosis not present

## 2017-05-27 ENCOUNTER — Ambulatory Visit: Payer: Medicare Other | Admitting: Gastroenterology

## 2017-05-27 ENCOUNTER — Telehealth: Payer: Self-pay | Admitting: Gastroenterology

## 2017-05-27 ENCOUNTER — Encounter: Payer: Self-pay | Admitting: Gastroenterology

## 2017-05-27 NOTE — Telephone Encounter (Signed)
PATIENT WAS A NO SHOW AND LETTER SENT  °

## 2017-05-27 NOTE — Telephone Encounter (Signed)
PLEASE CALL PT'S DAUGHTER. THEY SHOULD CALL FOR AN APPT IF THE DESIRE TO COMPLETE THE COLONOSCOPY.

## 2017-05-27 NOTE — Progress Notes (Deleted)
   Subjective:    Patient ID: Destiny Boyd, female    DOB: 05-30-1934, 83 y.o.   MRN: [id number]  HPI    Review of Systems     Objective:   Physical Exam        Assessment & Plan:

## 2017-05-28 DIAGNOSIS — D509 Iron deficiency anemia, unspecified: Secondary | ICD-10-CM | POA: Diagnosis not present

## 2017-05-28 DIAGNOSIS — N186 End stage renal disease: Secondary | ICD-10-CM | POA: Diagnosis not present

## 2017-05-28 DIAGNOSIS — N2581 Secondary hyperparathyroidism of renal origin: Secondary | ICD-10-CM | POA: Diagnosis not present

## 2017-05-28 DIAGNOSIS — D631 Anemia in chronic kidney disease: Secondary | ICD-10-CM | POA: Diagnosis not present

## 2017-05-28 DIAGNOSIS — Z992 Dependence on renal dialysis: Secondary | ICD-10-CM | POA: Diagnosis not present

## 2017-05-31 DIAGNOSIS — N2581 Secondary hyperparathyroidism of renal origin: Secondary | ICD-10-CM | POA: Diagnosis not present

## 2017-05-31 DIAGNOSIS — D631 Anemia in chronic kidney disease: Secondary | ICD-10-CM | POA: Diagnosis not present

## 2017-05-31 DIAGNOSIS — N186 End stage renal disease: Secondary | ICD-10-CM | POA: Diagnosis not present

## 2017-05-31 DIAGNOSIS — D509 Iron deficiency anemia, unspecified: Secondary | ICD-10-CM | POA: Diagnosis not present

## 2017-05-31 DIAGNOSIS — Z992 Dependence on renal dialysis: Secondary | ICD-10-CM | POA: Diagnosis not present

## 2017-06-01 ENCOUNTER — Ambulatory Visit (INDEPENDENT_AMBULATORY_CARE_PROVIDER_SITE_OTHER): Payer: Medicare Other | Admitting: *Deleted

## 2017-06-01 DIAGNOSIS — I4891 Unspecified atrial fibrillation: Secondary | ICD-10-CM | POA: Diagnosis not present

## 2017-06-01 DIAGNOSIS — Z7901 Long term (current) use of anticoagulants: Secondary | ICD-10-CM | POA: Diagnosis not present

## 2017-06-01 LAB — POCT INR: INR: 2

## 2017-06-02 DIAGNOSIS — N186 End stage renal disease: Secondary | ICD-10-CM | POA: Diagnosis not present

## 2017-06-02 DIAGNOSIS — D631 Anemia in chronic kidney disease: Secondary | ICD-10-CM | POA: Diagnosis not present

## 2017-06-02 DIAGNOSIS — Z992 Dependence on renal dialysis: Secondary | ICD-10-CM | POA: Diagnosis not present

## 2017-06-02 DIAGNOSIS — D509 Iron deficiency anemia, unspecified: Secondary | ICD-10-CM | POA: Diagnosis not present

## 2017-06-02 DIAGNOSIS — N2581 Secondary hyperparathyroidism of renal origin: Secondary | ICD-10-CM | POA: Diagnosis not present

## 2017-06-04 DIAGNOSIS — N186 End stage renal disease: Secondary | ICD-10-CM | POA: Diagnosis not present

## 2017-06-04 DIAGNOSIS — D509 Iron deficiency anemia, unspecified: Secondary | ICD-10-CM | POA: Diagnosis not present

## 2017-06-04 DIAGNOSIS — N2581 Secondary hyperparathyroidism of renal origin: Secondary | ICD-10-CM | POA: Diagnosis not present

## 2017-06-04 DIAGNOSIS — Z992 Dependence on renal dialysis: Secondary | ICD-10-CM | POA: Diagnosis not present

## 2017-06-04 DIAGNOSIS — D631 Anemia in chronic kidney disease: Secondary | ICD-10-CM | POA: Diagnosis not present

## 2017-06-06 DIAGNOSIS — D509 Iron deficiency anemia, unspecified: Secondary | ICD-10-CM | POA: Diagnosis not present

## 2017-06-06 DIAGNOSIS — N2581 Secondary hyperparathyroidism of renal origin: Secondary | ICD-10-CM | POA: Diagnosis not present

## 2017-06-06 DIAGNOSIS — D631 Anemia in chronic kidney disease: Secondary | ICD-10-CM | POA: Diagnosis not present

## 2017-06-06 DIAGNOSIS — Z992 Dependence on renal dialysis: Secondary | ICD-10-CM | POA: Diagnosis not present

## 2017-06-06 DIAGNOSIS — N186 End stage renal disease: Secondary | ICD-10-CM | POA: Diagnosis not present

## 2017-06-09 DIAGNOSIS — N2581 Secondary hyperparathyroidism of renal origin: Secondary | ICD-10-CM | POA: Diagnosis not present

## 2017-06-09 DIAGNOSIS — N186 End stage renal disease: Secondary | ICD-10-CM | POA: Diagnosis not present

## 2017-06-09 DIAGNOSIS — D509 Iron deficiency anemia, unspecified: Secondary | ICD-10-CM | POA: Diagnosis not present

## 2017-06-09 DIAGNOSIS — D631 Anemia in chronic kidney disease: Secondary | ICD-10-CM | POA: Diagnosis not present

## 2017-06-09 DIAGNOSIS — Z992 Dependence on renal dialysis: Secondary | ICD-10-CM | POA: Diagnosis not present

## 2017-06-11 DIAGNOSIS — N186 End stage renal disease: Secondary | ICD-10-CM | POA: Diagnosis not present

## 2017-06-11 DIAGNOSIS — Z992 Dependence on renal dialysis: Secondary | ICD-10-CM | POA: Diagnosis not present

## 2017-06-11 DIAGNOSIS — D509 Iron deficiency anemia, unspecified: Secondary | ICD-10-CM | POA: Diagnosis not present

## 2017-06-11 DIAGNOSIS — D631 Anemia in chronic kidney disease: Secondary | ICD-10-CM | POA: Diagnosis not present

## 2017-06-11 DIAGNOSIS — N2581 Secondary hyperparathyroidism of renal origin: Secondary | ICD-10-CM | POA: Diagnosis not present

## 2017-06-13 DIAGNOSIS — D509 Iron deficiency anemia, unspecified: Secondary | ICD-10-CM | POA: Diagnosis not present

## 2017-06-13 DIAGNOSIS — Z992 Dependence on renal dialysis: Secondary | ICD-10-CM | POA: Diagnosis not present

## 2017-06-13 DIAGNOSIS — N2581 Secondary hyperparathyroidism of renal origin: Secondary | ICD-10-CM | POA: Diagnosis not present

## 2017-06-13 DIAGNOSIS — N186 End stage renal disease: Secondary | ICD-10-CM | POA: Diagnosis not present

## 2017-06-13 DIAGNOSIS — D631 Anemia in chronic kidney disease: Secondary | ICD-10-CM | POA: Diagnosis not present

## 2017-06-15 ENCOUNTER — Ambulatory Visit (INDEPENDENT_AMBULATORY_CARE_PROVIDER_SITE_OTHER): Payer: Medicare Other | Admitting: *Deleted

## 2017-06-15 DIAGNOSIS — Z7901 Long term (current) use of anticoagulants: Secondary | ICD-10-CM | POA: Diagnosis not present

## 2017-06-15 DIAGNOSIS — I4891 Unspecified atrial fibrillation: Secondary | ICD-10-CM | POA: Diagnosis not present

## 2017-06-15 LAB — POCT INR: INR: 3.5

## 2017-06-16 DIAGNOSIS — N2581 Secondary hyperparathyroidism of renal origin: Secondary | ICD-10-CM | POA: Diagnosis not present

## 2017-06-16 DIAGNOSIS — D631 Anemia in chronic kidney disease: Secondary | ICD-10-CM | POA: Diagnosis not present

## 2017-06-16 DIAGNOSIS — Z992 Dependence on renal dialysis: Secondary | ICD-10-CM | POA: Diagnosis not present

## 2017-06-16 DIAGNOSIS — N186 End stage renal disease: Secondary | ICD-10-CM | POA: Diagnosis not present

## 2017-06-16 DIAGNOSIS — D509 Iron deficiency anemia, unspecified: Secondary | ICD-10-CM | POA: Diagnosis not present

## 2017-06-17 DIAGNOSIS — Z992 Dependence on renal dialysis: Secondary | ICD-10-CM | POA: Diagnosis not present

## 2017-06-17 DIAGNOSIS — N186 End stage renal disease: Secondary | ICD-10-CM | POA: Diagnosis not present

## 2017-06-18 ENCOUNTER — Ambulatory Visit: Payer: Medicare Other | Admitting: Cardiovascular Disease

## 2017-06-18 ENCOUNTER — Encounter: Payer: Self-pay | Admitting: Cardiovascular Disease

## 2017-06-18 DIAGNOSIS — N186 End stage renal disease: Secondary | ICD-10-CM | POA: Diagnosis not present

## 2017-06-18 DIAGNOSIS — D509 Iron deficiency anemia, unspecified: Secondary | ICD-10-CM | POA: Diagnosis not present

## 2017-06-18 DIAGNOSIS — D631 Anemia in chronic kidney disease: Secondary | ICD-10-CM | POA: Diagnosis not present

## 2017-06-18 DIAGNOSIS — Z992 Dependence on renal dialysis: Secondary | ICD-10-CM | POA: Diagnosis not present

## 2017-06-18 DIAGNOSIS — N2581 Secondary hyperparathyroidism of renal origin: Secondary | ICD-10-CM | POA: Diagnosis not present

## 2017-06-20 DIAGNOSIS — Z992 Dependence on renal dialysis: Secondary | ICD-10-CM | POA: Diagnosis not present

## 2017-06-20 DIAGNOSIS — D631 Anemia in chronic kidney disease: Secondary | ICD-10-CM | POA: Diagnosis not present

## 2017-06-20 DIAGNOSIS — N2581 Secondary hyperparathyroidism of renal origin: Secondary | ICD-10-CM | POA: Diagnosis not present

## 2017-06-20 DIAGNOSIS — D509 Iron deficiency anemia, unspecified: Secondary | ICD-10-CM | POA: Diagnosis not present

## 2017-06-20 DIAGNOSIS — N186 End stage renal disease: Secondary | ICD-10-CM | POA: Diagnosis not present

## 2017-06-23 DIAGNOSIS — D509 Iron deficiency anemia, unspecified: Secondary | ICD-10-CM | POA: Diagnosis not present

## 2017-06-23 DIAGNOSIS — D631 Anemia in chronic kidney disease: Secondary | ICD-10-CM | POA: Diagnosis not present

## 2017-06-23 DIAGNOSIS — N2581 Secondary hyperparathyroidism of renal origin: Secondary | ICD-10-CM | POA: Diagnosis not present

## 2017-06-23 DIAGNOSIS — N186 End stage renal disease: Secondary | ICD-10-CM | POA: Diagnosis not present

## 2017-06-23 DIAGNOSIS — Z992 Dependence on renal dialysis: Secondary | ICD-10-CM | POA: Diagnosis not present

## 2017-06-25 ENCOUNTER — Encounter: Payer: Self-pay | Admitting: Cardiovascular Disease

## 2017-06-25 ENCOUNTER — Ambulatory Visit (INDEPENDENT_AMBULATORY_CARE_PROVIDER_SITE_OTHER): Payer: Medicare Other | Admitting: Cardiovascular Disease

## 2017-06-25 VITALS — BP 120/76 | HR 115 | Ht 64.5 in | Wt 118.0 lb

## 2017-06-25 DIAGNOSIS — I4891 Unspecified atrial fibrillation: Secondary | ICD-10-CM | POA: Diagnosis not present

## 2017-06-25 DIAGNOSIS — R000 Tachycardia, unspecified: Secondary | ICD-10-CM | POA: Diagnosis not present

## 2017-06-25 DIAGNOSIS — I248 Other forms of acute ischemic heart disease: Secondary | ICD-10-CM | POA: Diagnosis not present

## 2017-06-25 DIAGNOSIS — I059 Rheumatic mitral valve disease, unspecified: Secondary | ICD-10-CM

## 2017-06-25 DIAGNOSIS — N2581 Secondary hyperparathyroidism of renal origin: Secondary | ICD-10-CM | POA: Diagnosis not present

## 2017-06-25 DIAGNOSIS — D631 Anemia in chronic kidney disease: Secondary | ICD-10-CM | POA: Diagnosis not present

## 2017-06-25 DIAGNOSIS — Z992 Dependence on renal dialysis: Secondary | ICD-10-CM | POA: Diagnosis not present

## 2017-06-25 DIAGNOSIS — N186 End stage renal disease: Secondary | ICD-10-CM | POA: Diagnosis not present

## 2017-06-25 DIAGNOSIS — D509 Iron deficiency anemia, unspecified: Secondary | ICD-10-CM | POA: Diagnosis not present

## 2017-06-25 MED ORDER — DILTIAZEM HCL ER COATED BEADS 120 MG PO CP24: ORAL_CAPSULE | ORAL | 3 refills | Status: AC

## 2017-06-25 NOTE — Progress Notes (Signed)
SUBJECTIVE: The patient was sent over from dialysis due to tachycardia.  I last evaluated her on 01/29/17 while she was hospitalized with rapid atrial fibrillation.  She is anticoagulated with warfarin.  She saw Gerrianne Scale PA-C on 02/11/17 for posthospitalization follow-up.  At that time she was in sinus rhythm, heart rate 85 bpm.  I personally reviewed this ECG.  She completed dialysis this morning.  She was told her heart rate was going too fast.  She denies chest pain, palpitations, and shortness of breath.  Echocardiogram 01/24/17: Normal left ventricular systolic function, LVEF 35-46%, severely calcified and severely thickened annulus, moderately thickened and moderately calcified leaflets, with mild mitral regurgitation.  Left atrium was mildly dilated.  ECG performed in the office today demonstrates rapid atrial fibrillation, 136 bpm.  Review of Systems: As per "subjective", otherwise negative.  No Known Allergies  Current Outpatient Medications  Medication Sig Dispense Refill  . diltiazem (CARDIZEM CD) 120 MG 24 hr capsule Take 1 capsule (120 mg total) by mouth 2 (two) times daily. 60 capsule 3  . multivitamin (RENA-VIT) TABS tablet Take 1 tablet by mouth daily.    . pantoprazole (PROTONIX) 40 MG tablet Take 1 tablet (40 mg total) by mouth daily. 30 tablet 3  . sevelamer carbonate (RENVELA) 800 MG tablet Take 3,200 mg by mouth 3 (three) times daily with meals. & 1600 by mouth with snacks    . TRADJENTA 5 MG TABS tablet Take 1 tablet by mouth daily.    . traMADol (ULTRAM) 50 MG tablet Take 50 mg by mouth 2 (two) times daily.    Marland Kitchen warfarin (COUMADIN) 3 MG tablet Take 1 tablet by mouth daily.     No current facility-administered medications for this visit.     Past Medical History:  Diagnosis Date  . 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 Colon cancer ([place])    cecum cancer  . Diabetes mellitus   . ESRD (end stage renal disease) ([place])   . Gout   . Hypercholesteremia   .  Hypertension   . Iron deficiency anemia   . Leukopenia   . Stroke [facility]) 2005    Past Surgical History:  Procedure Laterality Date  . ABDOMINAL HYSTERECTOMY    . CATARACT EXTRACTION, BILATERAL     lens implants  . COLON SURGERY  2010  . COLONOSCOPY  2008   Dr. Oneida Alar: 3-4 cm cecal polyp partially removed, path noting cecal adenocarcinoma, underwent right hemicolectomy with TI resection  . COLONOSCOPY  2009   Dr. Oneida Alar: 8mm sessile rectal polyp (benign), rare diverticula  . ESOPHAGOGASTRODUODENOSCOPY  2008   Dr. Oneida Alar: normal esophagus  . ESOPHAGOGASTRODUODENOSCOPY  2009   Dr. Oneida Alar: multiple antral erosions (reactive gastropathy), normal duodenum, normal esophagus  . EYE SURGERY    . GIVENS CAPSULE STUDY  2009   normal   . HEMICOLECTOMY  2008    Social History   Socioeconomic History  . Marital status: Widowed    Spouse name: Not on file  . Number of children: Not on file  . Years of education: Not on file  . Highest education level: Not on file  Social Needs  . Financial resource strain: Not on file  . Food insecurity - worry: Not on file  . Food insecurity - inability: Not on file  . Transportation needs - medical: Not on file  . Transportation needs - non-medical: Not on file  Occupational History  . Not on file  Tobacco Use  .  Smoking status: Former Smoker    Packs/day: 1.00    Years: 10.00    Pack years: 10.00    Types: Cigarettes    Last attempt to quit: 04/12/1985    Years since quitting: 32.2  . Smokeless tobacco: Never Used  Substance and Sexual Activity  . Alcohol use: No    Alcohol/week: 0.0 oz  . Drug use: No  . Sexual activity: No  Other Topics Concern  . Not on file  Social History Narrative  . Not on file     Vitals:   06/25/17 1057  BP: 120/76  Pulse: (!) 115  SpO2: 94%  Weight: 118 lb (53.5 kg)  Height: 5' 4.5" (1.638 m)    Wt Readings from Last 3 Encounters:  06/25/17 118 lb (53.5 kg)  02/20/17 120 lb 3.2 oz (54.5 kg)    02/11/17 119 lb 12.8 oz (54.3 kg)     PHYSICAL EXAM General: NAD HEENT: Normal. Neck: No JVD, no thyromegaly. Lungs: Clear to auscultation bilaterally with normal respiratory effort. CV: Tachycardic, irregular rhythm, normal S1/S2, no S3, 2/6 systolic murmur heard throughout precordium. No pretibial or periankle edema.     Abdomen: Soft, nontender, no distention.  Neurologic: Alert and oriented.  Psych: Normal affect. Skin: Normal. Musculoskeletal: No gross deformities.    ECG: Most recent ECG reviewed.   Labs: Lab Results  Component Value Date/Time   K 4.3 01/30/2017 04:05 AM   BUN 35 (H) 01/30/2017 04:05 AM   CREATININE 6.15 (H) 01/30/2017 04:05 AM   ALT 37 (H) 10/25/2013 12:55 PM   TSH 0.043 (L) 01/25/2017 04:38 AM   HGB 10.6 (L) 01/29/2017 05:16 AM     Lipids: No results found for: LDLCALC, LDLDIRECT, CHOL, TRIG, HDL     ASSESSMENT AND PLAN: 1.  Rapid atrial fibrillation: She is currently asymptomatic.  I will increase long-acting diltiazem to 240 mg every morning and 120 mg every she is appropriately anticoagulated with warfarin.  2.  Mitral valve disease: Echocardiogram detailed above.  I will monitor clinically and with surveillance echocardiography.   Disposition: Follow up one week.  I instructed her to go to the ED if she were to develop symptomatic decline.  Kate Sable, M.D., F.A.C.C.

## 2017-06-25 NOTE — Patient Instructions (Signed)
Your physician recommends that you schedule a follow-up appointment in:  Next week with NP or PA-C     INCREASE Cardizem to 240 mg ( 2 tablets) in the am, and take 120 mg ( 1 tablet) in the pm   ALL other medicines stay the same.   No lab work or tests ordered today.        Thank you for choosing [facility] !

## 2017-06-27 DIAGNOSIS — D631 Anemia in chronic kidney disease: Secondary | ICD-10-CM | POA: Diagnosis not present

## 2017-06-27 DIAGNOSIS — D509 Iron deficiency anemia, unspecified: Secondary | ICD-10-CM | POA: Diagnosis not present

## 2017-06-27 DIAGNOSIS — Z992 Dependence on renal dialysis: Secondary | ICD-10-CM | POA: Diagnosis not present

## 2017-06-27 DIAGNOSIS — N2581 Secondary hyperparathyroidism of renal origin: Secondary | ICD-10-CM | POA: Diagnosis not present

## 2017-06-27 DIAGNOSIS — N186 End stage renal disease: Secondary | ICD-10-CM | POA: Diagnosis not present

## 2017-06-30 DIAGNOSIS — D509 Iron deficiency anemia, unspecified: Secondary | ICD-10-CM | POA: Diagnosis not present

## 2017-06-30 DIAGNOSIS — N186 End stage renal disease: Secondary | ICD-10-CM | POA: Diagnosis not present

## 2017-06-30 DIAGNOSIS — D631 Anemia in chronic kidney disease: Secondary | ICD-10-CM | POA: Diagnosis not present

## 2017-06-30 DIAGNOSIS — N2581 Secondary hyperparathyroidism of renal origin: Secondary | ICD-10-CM | POA: Diagnosis not present

## 2017-06-30 DIAGNOSIS — Z992 Dependence on renal dialysis: Secondary | ICD-10-CM | POA: Diagnosis not present

## 2017-07-02 DIAGNOSIS — D509 Iron deficiency anemia, unspecified: Secondary | ICD-10-CM | POA: Diagnosis not present

## 2017-07-02 DIAGNOSIS — Z992 Dependence on renal dialysis: Secondary | ICD-10-CM | POA: Diagnosis not present

## 2017-07-02 DIAGNOSIS — D631 Anemia in chronic kidney disease: Secondary | ICD-10-CM | POA: Diagnosis not present

## 2017-07-02 DIAGNOSIS — N2581 Secondary hyperparathyroidism of renal origin: Secondary | ICD-10-CM | POA: Diagnosis not present

## 2017-07-02 DIAGNOSIS — N186 End stage renal disease: Secondary | ICD-10-CM | POA: Diagnosis not present

## 2017-07-04 DIAGNOSIS — N186 End stage renal disease: Secondary | ICD-10-CM | POA: Diagnosis not present

## 2017-07-04 DIAGNOSIS — N2581 Secondary hyperparathyroidism of renal origin: Secondary | ICD-10-CM | POA: Diagnosis not present

## 2017-07-04 DIAGNOSIS — D631 Anemia in chronic kidney disease: Secondary | ICD-10-CM | POA: Diagnosis not present

## 2017-07-04 DIAGNOSIS — Z992 Dependence on renal dialysis: Secondary | ICD-10-CM | POA: Diagnosis not present

## 2017-07-04 DIAGNOSIS — D509 Iron deficiency anemia, unspecified: Secondary | ICD-10-CM | POA: Diagnosis not present

## 2017-07-06 ENCOUNTER — Encounter: Payer: Self-pay | Admitting: *Deleted

## 2017-07-06 NOTE — Progress Notes (Signed)
Cardiology Office Note    Date:  07/07/2017   ID:  Destiny Boyd, DOB [birth year], MRN [id number]  PCP:  Rosita Fire, MD  Cardiologist: Dr. Bronson Ing   Chief Complaint  Patient presents with  . Follow-up    Recent episode of Atrial Fibrillation with RVR --> 1 week follow-up    History of Present Illness:    Destiny Boyd is a 83 y.o. female with past medical history of ESRD (on HD T/TH/SAT), PAF, prior CVA, and colon cancer who presents to the office today for 1-week follow-up.  She was recently evaluated by Dr. Bronson Ing on 06/25/2017 following a session of HD at which time she was told her HR was significantly elevated. She denied any associated palpitations, chest pain, or dyspnea at that time. An EKG was obtained and showed she was in atrial fibrillation with RVR, HR 136. Cardizem CD was increased to 240mg  in AM and 120mg  in PM and she was continued on Coumadin for anticoagulation.   In talking with the patient today, she reports overall doing well since her last office visit. She denies any recent chest pain, palpitations, dyspnea on exertion, orthopnea, PND, or lower extremity edema. She has been going to hemodialysis and reports her heart rate and blood pressure have been well controlled during her sessions.  She reports good compliance with her medication regimen including Cardizem and Coumadin. She denies any evidence of active bleeding.  Past Medical History:  Diagnosis Date  . 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 Colon cancer ([place])    cecum cancer  . Diabetes mellitus   . ESRD (end stage renal disease) ([place])   . Gout   . Hypercholesteremia   . Hypertension   . Iron deficiency anemia   . Leukopenia   . PAF (paroxysmal atrial fibrillation) ([facility])    a. on Coumadin for anticoagulation  . Stroke [facility]) 2005    Past Surgical History:  Procedure Laterality Date  . ABDOMINAL HYSTERECTOMY    . AV FISTULA PLACEMENT Left 08/24/2013   Procedure: ARTERIOVENOUS  (AV) FISTULA CREATION- LEFT BRACHIAL CEPHALIC;  Surgeon: Conrad Vidette, MD;  Location: [place];  Service: Vascular;  Laterality: Left;  . CATARACT EXTRACTION, BILATERAL     lens implants  . COLON SURGERY  2010  . COLONOSCOPY  2008   Dr. Oneida Alar: 3-4 cm cecal polyp partially removed, path noting cecal adenocarcinoma, underwent right hemicolectomy with TI resection  . COLONOSCOPY  2009   Dr. Oneida Alar: 64mm sessile rectal polyp (benign), rare diverticula  . ESOPHAGOGASTRODUODENOSCOPY  2008   Dr. Oneida Alar: normal esophagus  . ESOPHAGOGASTRODUODENOSCOPY  2009   Dr. Oneida Alar: multiple antral erosions (reactive gastropathy), normal duodenum, normal esophagus  . EYE SURGERY    . GIVENS CAPSULE STUDY  2009   normal   . HEMICOLECTOMY  2008  . PERIPHERAL VASCULAR CATHETERIZATION N/A 03/03/2016   Procedure: Fistulagram;  Surgeon: Angelia Mould, MD;  Location: [place] CV LAB;  Service: Cardiovascular;  Laterality: N/A;  . PERIPHERAL VASCULAR CATHETERIZATION Left 03/03/2016   Procedure: Peripheral Vascular Balloon Angioplasty;  Surgeon: Angelia Mould, MD;  Location: [place] CV LAB;  Service: Cardiovascular;  Laterality: Left;  arm fistula pta    Current Medications: Outpatient Medications Prior to Visit  Medication Sig Dispense Refill  . diltiazem (CARDIZEM CD) 120 MG 24 hr capsule Take 240 mg ( 2 tablets ) in th am, and take 120 mg ( 1 tablet)  in the pm 90 capsule 3  .  multivitamin (RENA-VIT) TABS tablet Take 1 tablet by mouth daily.    . pantoprazole (PROTONIX) 40 MG tablet Take 1 tablet (40 mg total) by mouth daily. 30 tablet 3  . sevelamer carbonate (RENVELA) 800 MG tablet Take 3,200 mg by mouth 3 (three) times daily with meals. & 1600 by mouth with snacks    . TRADJENTA 5 MG TABS tablet Take 1 tablet by mouth daily.    . traMADol (ULTRAM) 50 MG tablet Take 50 mg by mouth 2 (two) times daily.    Marland Kitchen warfarin (COUMADIN) 3 MG tablet Take 1 tablet by mouth daily.     No  facility-administered medications prior to visit.      Allergies:   Patient has no known allergies.   Social History   Socioeconomic History  . Marital status: Widowed    Spouse name: None  . Number of children: None  . Years of education: None  . Highest education level: None  Social Needs  . Financial resource strain: None  . Food insecurity - worry: None  . Food insecurity - inability: None  . Transportation needs - medical: None  . Transportation needs - non-medical: None  Occupational History  . None  Tobacco Use  . Smoking status: Former Smoker    Packs/day: 1.00    Years: 10.00    Pack years: 10.00    Types: Cigarettes    Last attempt to quit: 04/12/1985    Years since quitting: 32.2  . Smokeless tobacco: Never Used  Substance and Sexual Activity  . Alcohol use: No    Alcohol/week: 0.0 oz  . Drug use: No  . Sexual activity: No  Other Topics Concern  . None  Social History Narrative  . None     Family History:  The patient's family history includes Cancer in her son; Diabetes in her mother and son; Heart disease in her son; Hyperlipidemia in her daughter and son; Hypertension in her mother; Varicose Veins in her daughter.   Review of Systems:   Please see the history of present illness.     General:  No chills, fever, night sweats or weight changes.  Cardiovascular:  No chest pain, dyspnea on exertion, edema, orthopnea, palpitations, paroxysmal nocturnal dyspnea. Dermatological: No rash, lesions/masses. Positive for easy bruising.  Respiratory: No cough, dyspnea Urologic: No hematuria, dysuria Abdominal:   No nausea, vomiting, diarrhea, bright red blood per rectum, melena, or hematemesis Neurologic:  No visual changes, wkns, changes in mental status.  All other systems reviewed and are otherwise negative except as noted above.   Physical Exam:    VS:  BP 130/62   Pulse 80   Ht 5\' 4"  (1.626 m)   Wt 117 lb (53.1 kg)   SpO2 95%   BMI 20.08 kg/m      General: Well developed, well nourished [place] American female appearing in no acute distress. Head: Normocephalic, atraumatic, sclera non-icteric, no xanthomas, nares are without discharge.  Neck: No carotid bruits. JVD not elevated.  Lungs: Respirations regular and unlabored, without wheezes or rales.  Heart: Regular rate and rhythm with occasional ectopic beats. No S3 or S4.  No murmur, no rubs, or gallops appreciated. Abdomen: Soft, non-tender, non-distended with normoactive bowel sounds. No hepatomegaly. No rebound/guarding. No obvious abdominal masses. Msk:  Strength and tone appear normal for age. No joint deformities or effusions. Extremities: No clubbing or cyanosis. No edema.  Distal pedal pulses are 2+ bilaterally. Neuro: Alert and oriented X 3. Moves all extremities spontaneously.  No focal deficits noted. Psych:  Responds to questions appropriately with a normal affect. Skin: No rashes or lesions noted. Small ecchymosis along left shin.   Wt Readings from Last 3 Encounters:  07/07/17 117 lb (53.1 kg)  06/25/17 118 lb (53.5 kg)  02/20/17 120 lb 3.2 oz (54.5 kg)     Studies/Labs Reviewed:   EKG:  EKG is ordered today.  The ekg ordered today demonstrates NSR, HR 73, with occasional PAC's.   Recent Labs: 01/24/2017: Magnesium 1.9 01/25/2017: TSH 0.043 01/29/2017: Hemoglobin 10.6; Platelets 195 01/30/2017: BUN 35; Creatinine, Ser 6.15; Potassium 4.3; Sodium 127   Lipid Panel No results found for: CHOL, TRIG, HDL, CHOLHDL, VLDL, LDLCALC, LDLDIRECT  Additional studies/ records that were reviewed today include:   Echocardiogram: 01/2017 Study Conclusions  - Left ventricle: Wall thickness was increased in a pattern of mild   LVH. Systolic function was normal. The estimated ejection   fraction was in the range of 55% to 60%. Left ventricular   diastolic function parameters were normal. - Mitral valve: Severely calcified, severely thickened annulus.   Moderately thickened,  moderately calcified leaflets . There was   mild regurgitation. - Left atrium: The atrium was mildly dilated. - Atrial septum: No defect or patent foramen ovale was identified. - Pulmonary arteries: PA peak pressure: 35 mm Hg (S).  Assessment:    1. Paroxysmal atrial fibrillation (HCC)   2. Long term (current) use of anticoagulants   3. Essential hypertension   4. ESRD on dialysis [facility])      Plan:   In order of problems listed above:  1. Paroxysmal Atrial Fibrillation/ Use of Long-term Anticoagulation  - the patient has a known history of atrial fibrillation and HR was found to be elevated into the 130's while at HD earlier this month. She was evaluated in the office and Cardizem was titrated to 240mg  in AM and 120mg  in PM for improved rate control.  - she denies any recent chest pain, palpitations, or dyspnea on exertion. Echo in 01/2017 showed a preserved EF of 55-60% with no regional WMA. EKG obtained today is most consistent with NSR with PAC's as distinct p-waves are noted but she does have premature beats.  - will continue on Cardizem CD 240mg  AM/120mg  PM as HR and BP are tolerating this well. Continue Coumadin for anticoagulation as she denies any evidence of active bleeding.   2. HTN - BP is well-controlled at 130/62 during her visit.  - continue current medication regimen.   3. ESRD - on HD Tuesday, Thursday, and Saturday.   Medication Adjustments/Labs and Tests Ordered: Current medicines are reviewed at length with the patient today.  Concerns regarding medicines are outlined above.  Medication changes, Labs and Tests ordered today are listed in the Patient Instructions below. Patient Instructions  Medication Instructions:  Your physician recommends that you continue on your current medications as directed. Please refer to the Current Medication list given to you today. Continue current dose of Cardizem  Labwork: NONE   Testing/Procedures: NONE  Follow-Up: Your  physician wants you to follow-up in: 6 Months with Dr. Bronson Ing. You will receive a reminder letter in the mail two months in advance. If you don't receive a letter, please call our office to schedule the follow-up appointment.  Any Other Special Instructions Will Be Listed Below (If Applicable).  If you need a refill on your cardiac medications before your next appointment, please call your pharmacy.  Thank you for choosing [facility]!  Signed, Erma Heritage, PA-C  07/07/2017 4:44 PM    [facility] [place], [place], [place]  [address] Phone: [phone]; Fax: [phone]  [address] [place], [place] [address] Phone: [phone]

## 2017-07-07 ENCOUNTER — Encounter: Payer: Self-pay | Admitting: Student

## 2017-07-07 ENCOUNTER — Ambulatory Visit (INDEPENDENT_AMBULATORY_CARE_PROVIDER_SITE_OTHER): Payer: Medicare Other | Admitting: Student

## 2017-07-07 VITALS — BP 130/62 | HR 80 | Ht 64.0 in | Wt 117.0 lb

## 2017-07-07 DIAGNOSIS — N186 End stage renal disease: Secondary | ICD-10-CM

## 2017-07-07 DIAGNOSIS — Z992 Dependence on renal dialysis: Secondary | ICD-10-CM

## 2017-07-07 DIAGNOSIS — Z7901 Long term (current) use of anticoagulants: Secondary | ICD-10-CM | POA: Diagnosis not present

## 2017-07-07 DIAGNOSIS — I10 Essential (primary) hypertension: Secondary | ICD-10-CM

## 2017-07-07 DIAGNOSIS — I248 Other forms of acute ischemic heart disease: Secondary | ICD-10-CM

## 2017-07-07 DIAGNOSIS — D509 Iron deficiency anemia, unspecified: Secondary | ICD-10-CM | POA: Diagnosis not present

## 2017-07-07 DIAGNOSIS — I480 Paroxysmal atrial fibrillation: Secondary | ICD-10-CM

## 2017-07-07 DIAGNOSIS — D631 Anemia in chronic kidney disease: Secondary | ICD-10-CM | POA: Diagnosis not present

## 2017-07-07 DIAGNOSIS — N2581 Secondary hyperparathyroidism of renal origin: Secondary | ICD-10-CM | POA: Diagnosis not present

## 2017-07-07 NOTE — Patient Instructions (Addendum)
Medication Instructions:  Your physician recommends that you continue on your current medications as directed. Please refer to the Current Medication list given to you today. Continue current dose of Cardizem  Labwork: NONE   Testing/Procedures: NONE  Follow-Up: Your physician wants you to follow-up in: 6 Months with Dr. Bronson Ing. You will receive a reminder letter in the mail two months in advance. If you don't receive a letter, please call our office to schedule the follow-up appointment.   Any Other Special Instructions Will Be Listed Below (If Applicable).     If you need a refill on your cardiac medications before your next appointment, please call your pharmacy.  Thank you for choosing [facility]!

## 2017-07-09 DIAGNOSIS — N2581 Secondary hyperparathyroidism of renal origin: Secondary | ICD-10-CM | POA: Diagnosis not present

## 2017-07-09 DIAGNOSIS — D509 Iron deficiency anemia, unspecified: Secondary | ICD-10-CM | POA: Diagnosis not present

## 2017-07-09 DIAGNOSIS — D631 Anemia in chronic kidney disease: Secondary | ICD-10-CM | POA: Diagnosis not present

## 2017-07-09 DIAGNOSIS — N186 End stage renal disease: Secondary | ICD-10-CM | POA: Diagnosis not present

## 2017-07-09 DIAGNOSIS — Z992 Dependence on renal dialysis: Secondary | ICD-10-CM | POA: Diagnosis not present

## 2017-07-11 DIAGNOSIS — D509 Iron deficiency anemia, unspecified: Secondary | ICD-10-CM | POA: Diagnosis not present

## 2017-07-11 DIAGNOSIS — Z992 Dependence on renal dialysis: Secondary | ICD-10-CM | POA: Diagnosis not present

## 2017-07-11 DIAGNOSIS — N186 End stage renal disease: Secondary | ICD-10-CM | POA: Diagnosis not present

## 2017-07-11 DIAGNOSIS — N2581 Secondary hyperparathyroidism of renal origin: Secondary | ICD-10-CM | POA: Diagnosis not present

## 2017-07-11 DIAGNOSIS — D631 Anemia in chronic kidney disease: Secondary | ICD-10-CM | POA: Diagnosis not present

## 2017-07-14 DIAGNOSIS — D631 Anemia in chronic kidney disease: Secondary | ICD-10-CM | POA: Diagnosis not present

## 2017-07-14 DIAGNOSIS — N2581 Secondary hyperparathyroidism of renal origin: Secondary | ICD-10-CM | POA: Diagnosis not present

## 2017-07-14 DIAGNOSIS — Z992 Dependence on renal dialysis: Secondary | ICD-10-CM | POA: Diagnosis not present

## 2017-07-14 DIAGNOSIS — N186 End stage renal disease: Secondary | ICD-10-CM | POA: Diagnosis not present

## 2017-07-14 DIAGNOSIS — D509 Iron deficiency anemia, unspecified: Secondary | ICD-10-CM | POA: Diagnosis not present

## 2017-07-16 DIAGNOSIS — N186 End stage renal disease: Secondary | ICD-10-CM | POA: Diagnosis not present

## 2017-07-16 DIAGNOSIS — Z992 Dependence on renal dialysis: Secondary | ICD-10-CM | POA: Diagnosis not present

## 2017-07-16 DIAGNOSIS — D631 Anemia in chronic kidney disease: Secondary | ICD-10-CM | POA: Diagnosis not present

## 2017-07-16 DIAGNOSIS — D509 Iron deficiency anemia, unspecified: Secondary | ICD-10-CM | POA: Diagnosis not present

## 2017-07-16 DIAGNOSIS — N2581 Secondary hyperparathyroidism of renal origin: Secondary | ICD-10-CM | POA: Diagnosis not present

## 2017-07-17 DIAGNOSIS — Z992 Dependence on renal dialysis: Secondary | ICD-10-CM | POA: Diagnosis not present

## 2017-07-17 DIAGNOSIS — N186 End stage renal disease: Secondary | ICD-10-CM | POA: Diagnosis not present

## 2017-07-18 DIAGNOSIS — N186 End stage renal disease: Secondary | ICD-10-CM | POA: Diagnosis not present

## 2017-07-18 DIAGNOSIS — N2581 Secondary hyperparathyroidism of renal origin: Secondary | ICD-10-CM | POA: Diagnosis not present

## 2017-07-18 DIAGNOSIS — Z992 Dependence on renal dialysis: Secondary | ICD-10-CM | POA: Diagnosis not present

## 2017-07-18 DIAGNOSIS — D631 Anemia in chronic kidney disease: Secondary | ICD-10-CM | POA: Diagnosis not present

## 2017-07-18 DIAGNOSIS — D509 Iron deficiency anemia, unspecified: Secondary | ICD-10-CM | POA: Diagnosis not present

## 2017-07-21 DIAGNOSIS — Z992 Dependence on renal dialysis: Secondary | ICD-10-CM | POA: Diagnosis not present

## 2017-07-21 DIAGNOSIS — D631 Anemia in chronic kidney disease: Secondary | ICD-10-CM | POA: Diagnosis not present

## 2017-07-21 DIAGNOSIS — D509 Iron deficiency anemia, unspecified: Secondary | ICD-10-CM | POA: Diagnosis not present

## 2017-07-21 DIAGNOSIS — N186 End stage renal disease: Secondary | ICD-10-CM | POA: Diagnosis not present

## 2017-07-21 DIAGNOSIS — N2581 Secondary hyperparathyroidism of renal origin: Secondary | ICD-10-CM | POA: Diagnosis not present

## 2017-07-23 DIAGNOSIS — D631 Anemia in chronic kidney disease: Secondary | ICD-10-CM | POA: Diagnosis not present

## 2017-07-23 DIAGNOSIS — N186 End stage renal disease: Secondary | ICD-10-CM | POA: Diagnosis not present

## 2017-07-23 DIAGNOSIS — D509 Iron deficiency anemia, unspecified: Secondary | ICD-10-CM | POA: Diagnosis not present

## 2017-07-23 DIAGNOSIS — Z992 Dependence on renal dialysis: Secondary | ICD-10-CM | POA: Diagnosis not present

## 2017-07-23 DIAGNOSIS — N2581 Secondary hyperparathyroidism of renal origin: Secondary | ICD-10-CM | POA: Diagnosis not present

## 2017-07-25 DIAGNOSIS — N2581 Secondary hyperparathyroidism of renal origin: Secondary | ICD-10-CM | POA: Diagnosis not present

## 2017-07-25 DIAGNOSIS — D631 Anemia in chronic kidney disease: Secondary | ICD-10-CM | POA: Diagnosis not present

## 2017-07-25 DIAGNOSIS — Z992 Dependence on renal dialysis: Secondary | ICD-10-CM | POA: Diagnosis not present

## 2017-07-25 DIAGNOSIS — D509 Iron deficiency anemia, unspecified: Secondary | ICD-10-CM | POA: Diagnosis not present

## 2017-07-25 DIAGNOSIS — N186 End stage renal disease: Secondary | ICD-10-CM | POA: Diagnosis not present

## 2017-07-28 DIAGNOSIS — Z992 Dependence on renal dialysis: Secondary | ICD-10-CM | POA: Diagnosis not present

## 2017-07-28 DIAGNOSIS — N186 End stage renal disease: Secondary | ICD-10-CM | POA: Diagnosis not present

## 2017-07-28 DIAGNOSIS — N2581 Secondary hyperparathyroidism of renal origin: Secondary | ICD-10-CM | POA: Diagnosis not present

## 2017-07-28 DIAGNOSIS — D631 Anemia in chronic kidney disease: Secondary | ICD-10-CM | POA: Diagnosis not present

## 2017-07-28 DIAGNOSIS — D509 Iron deficiency anemia, unspecified: Secondary | ICD-10-CM | POA: Diagnosis not present

## 2017-07-30 DIAGNOSIS — Z992 Dependence on renal dialysis: Secondary | ICD-10-CM | POA: Diagnosis not present

## 2017-07-30 DIAGNOSIS — D509 Iron deficiency anemia, unspecified: Secondary | ICD-10-CM | POA: Diagnosis not present

## 2017-07-30 DIAGNOSIS — N2581 Secondary hyperparathyroidism of renal origin: Secondary | ICD-10-CM | POA: Diagnosis not present

## 2017-07-30 DIAGNOSIS — N186 End stage renal disease: Secondary | ICD-10-CM | POA: Diagnosis not present

## 2017-07-30 DIAGNOSIS — D631 Anemia in chronic kidney disease: Secondary | ICD-10-CM | POA: Diagnosis not present

## 2017-08-01 DIAGNOSIS — D631 Anemia in chronic kidney disease: Secondary | ICD-10-CM | POA: Diagnosis not present

## 2017-08-01 DIAGNOSIS — N186 End stage renal disease: Secondary | ICD-10-CM | POA: Diagnosis not present

## 2017-08-01 DIAGNOSIS — D509 Iron deficiency anemia, unspecified: Secondary | ICD-10-CM | POA: Diagnosis not present

## 2017-08-01 DIAGNOSIS — N2581 Secondary hyperparathyroidism of renal origin: Secondary | ICD-10-CM | POA: Diagnosis not present

## 2017-08-01 DIAGNOSIS — Z992 Dependence on renal dialysis: Secondary | ICD-10-CM | POA: Diagnosis not present

## 2017-08-03 DIAGNOSIS — Z1389 Encounter for screening for other disorder: Secondary | ICD-10-CM | POA: Diagnosis not present

## 2017-08-03 DIAGNOSIS — D631 Anemia in chronic kidney disease: Secondary | ICD-10-CM | POA: Diagnosis not present

## 2017-08-03 DIAGNOSIS — D649 Anemia, unspecified: Secondary | ICD-10-CM | POA: Diagnosis not present

## 2017-08-03 DIAGNOSIS — F17210 Nicotine dependence, cigarettes, uncomplicated: Secondary | ICD-10-CM | POA: Diagnosis not present

## 2017-08-03 DIAGNOSIS — D509 Iron deficiency anemia, unspecified: Secondary | ICD-10-CM | POA: Diagnosis not present

## 2017-08-03 DIAGNOSIS — Z992 Dependence on renal dialysis: Secondary | ICD-10-CM | POA: Diagnosis not present

## 2017-08-03 DIAGNOSIS — E119 Type 2 diabetes mellitus without complications: Secondary | ICD-10-CM | POA: Diagnosis not present

## 2017-08-03 DIAGNOSIS — I10 Essential (primary) hypertension: Secondary | ICD-10-CM | POA: Diagnosis not present

## 2017-08-03 DIAGNOSIS — N2581 Secondary hyperparathyroidism of renal origin: Secondary | ICD-10-CM | POA: Diagnosis not present

## 2017-08-03 DIAGNOSIS — N186 End stage renal disease: Secondary | ICD-10-CM | POA: Diagnosis not present

## 2017-08-04 DIAGNOSIS — D631 Anemia in chronic kidney disease: Secondary | ICD-10-CM | POA: Diagnosis not present

## 2017-08-04 DIAGNOSIS — N2581 Secondary hyperparathyroidism of renal origin: Secondary | ICD-10-CM | POA: Diagnosis not present

## 2017-08-04 DIAGNOSIS — D509 Iron deficiency anemia, unspecified: Secondary | ICD-10-CM | POA: Diagnosis not present

## 2017-08-04 DIAGNOSIS — Z992 Dependence on renal dialysis: Secondary | ICD-10-CM | POA: Diagnosis not present

## 2017-08-04 DIAGNOSIS — N186 End stage renal disease: Secondary | ICD-10-CM | POA: Diagnosis not present

## 2017-08-06 DIAGNOSIS — N186 End stage renal disease: Secondary | ICD-10-CM | POA: Diagnosis not present

## 2017-08-06 DIAGNOSIS — D631 Anemia in chronic kidney disease: Secondary | ICD-10-CM | POA: Diagnosis not present

## 2017-08-06 DIAGNOSIS — N2581 Secondary hyperparathyroidism of renal origin: Secondary | ICD-10-CM | POA: Diagnosis not present

## 2017-08-06 DIAGNOSIS — Z992 Dependence on renal dialysis: Secondary | ICD-10-CM | POA: Diagnosis not present

## 2017-08-06 DIAGNOSIS — D509 Iron deficiency anemia, unspecified: Secondary | ICD-10-CM | POA: Diagnosis not present

## 2017-08-08 DIAGNOSIS — N186 End stage renal disease: Secondary | ICD-10-CM | POA: Diagnosis not present

## 2017-08-08 DIAGNOSIS — D509 Iron deficiency anemia, unspecified: Secondary | ICD-10-CM | POA: Diagnosis not present

## 2017-08-08 DIAGNOSIS — D631 Anemia in chronic kidney disease: Secondary | ICD-10-CM | POA: Diagnosis not present

## 2017-08-08 DIAGNOSIS — Z992 Dependence on renal dialysis: Secondary | ICD-10-CM | POA: Diagnosis not present

## 2017-08-08 DIAGNOSIS — N2581 Secondary hyperparathyroidism of renal origin: Secondary | ICD-10-CM | POA: Diagnosis not present

## 2017-08-10 DIAGNOSIS — D631 Anemia in chronic kidney disease: Secondary | ICD-10-CM | POA: Diagnosis not present

## 2017-08-10 DIAGNOSIS — N186 End stage renal disease: Secondary | ICD-10-CM | POA: Diagnosis not present

## 2017-08-10 DIAGNOSIS — D509 Iron deficiency anemia, unspecified: Secondary | ICD-10-CM | POA: Diagnosis not present

## 2017-08-10 DIAGNOSIS — N2581 Secondary hyperparathyroidism of renal origin: Secondary | ICD-10-CM | POA: Diagnosis not present

## 2017-08-10 DIAGNOSIS — Z992 Dependence on renal dialysis: Secondary | ICD-10-CM | POA: Diagnosis not present

## 2017-08-13 DIAGNOSIS — D509 Iron deficiency anemia, unspecified: Secondary | ICD-10-CM | POA: Diagnosis not present

## 2017-08-13 DIAGNOSIS — D631 Anemia in chronic kidney disease: Secondary | ICD-10-CM | POA: Diagnosis not present

## 2017-08-13 DIAGNOSIS — N186 End stage renal disease: Secondary | ICD-10-CM | POA: Diagnosis not present

## 2017-08-13 DIAGNOSIS — N2581 Secondary hyperparathyroidism of renal origin: Secondary | ICD-10-CM | POA: Diagnosis not present

## 2017-08-13 DIAGNOSIS — Z992 Dependence on renal dialysis: Secondary | ICD-10-CM | POA: Diagnosis not present

## 2017-08-15 DIAGNOSIS — N2581 Secondary hyperparathyroidism of renal origin: Secondary | ICD-10-CM | POA: Diagnosis not present

## 2017-08-15 DIAGNOSIS — D509 Iron deficiency anemia, unspecified: Secondary | ICD-10-CM | POA: Diagnosis not present

## 2017-08-15 DIAGNOSIS — D631 Anemia in chronic kidney disease: Secondary | ICD-10-CM | POA: Diagnosis not present

## 2017-08-15 DIAGNOSIS — Z992 Dependence on renal dialysis: Secondary | ICD-10-CM | POA: Diagnosis not present

## 2017-08-15 DIAGNOSIS — N186 End stage renal disease: Secondary | ICD-10-CM | POA: Diagnosis not present

## 2017-08-17 DIAGNOSIS — N186 End stage renal disease: Secondary | ICD-10-CM | POA: Diagnosis not present

## 2017-08-17 DIAGNOSIS — Z992 Dependence on renal dialysis: Secondary | ICD-10-CM | POA: Diagnosis not present

## 2017-08-18 DIAGNOSIS — N2581 Secondary hyperparathyroidism of renal origin: Secondary | ICD-10-CM | POA: Diagnosis not present

## 2017-08-18 DIAGNOSIS — Z992 Dependence on renal dialysis: Secondary | ICD-10-CM | POA: Diagnosis not present

## 2017-08-18 DIAGNOSIS — N186 End stage renal disease: Secondary | ICD-10-CM | POA: Diagnosis not present

## 2017-08-18 DIAGNOSIS — D631 Anemia in chronic kidney disease: Secondary | ICD-10-CM | POA: Diagnosis not present

## 2017-08-18 DIAGNOSIS — D509 Iron deficiency anemia, unspecified: Secondary | ICD-10-CM | POA: Diagnosis not present

## 2017-08-20 ENCOUNTER — Other Ambulatory Visit: Payer: Self-pay

## 2017-08-20 DIAGNOSIS — D631 Anemia in chronic kidney disease: Secondary | ICD-10-CM | POA: Diagnosis not present

## 2017-08-20 DIAGNOSIS — Z992 Dependence on renal dialysis: Secondary | ICD-10-CM | POA: Diagnosis not present

## 2017-08-20 DIAGNOSIS — N2581 Secondary hyperparathyroidism of renal origin: Secondary | ICD-10-CM | POA: Diagnosis not present

## 2017-08-20 DIAGNOSIS — N186 End stage renal disease: Secondary | ICD-10-CM | POA: Diagnosis not present

## 2017-08-20 DIAGNOSIS — I83229 Varicose veins of left lower extremity with both ulcer of unspecified site and inflammation: Principal | ICD-10-CM

## 2017-08-20 DIAGNOSIS — I83219 Varicose veins of right lower extremity with both ulcer of unspecified site and inflammation: Secondary | ICD-10-CM

## 2017-08-20 DIAGNOSIS — L97919 Non-pressure chronic ulcer of unspecified part of right lower leg with unspecified severity: Principal | ICD-10-CM

## 2017-08-20 DIAGNOSIS — L97929 Non-pressure chronic ulcer of unspecified part of left lower leg with unspecified severity: Principal | ICD-10-CM

## 2017-08-20 DIAGNOSIS — D509 Iron deficiency anemia, unspecified: Secondary | ICD-10-CM | POA: Diagnosis not present

## 2017-08-21 ENCOUNTER — Other Ambulatory Visit: Payer: Self-pay | Admitting: *Deleted

## 2017-08-21 ENCOUNTER — Encounter: Payer: Self-pay | Admitting: Vascular Surgery

## 2017-08-21 ENCOUNTER — Telehealth: Payer: Self-pay | Admitting: *Deleted

## 2017-08-21 ENCOUNTER — Encounter: Payer: Self-pay | Admitting: *Deleted

## 2017-08-21 ENCOUNTER — Ambulatory Visit (INDEPENDENT_AMBULATORY_CARE_PROVIDER_SITE_OTHER): Payer: Medicare Other | Admitting: Vascular Surgery

## 2017-08-21 ENCOUNTER — Ambulatory Visit (HOSPITAL_COMMUNITY)
Admission: RE | Admit: 2017-08-21 | Discharge: 2017-08-21 | Disposition: A | Discharge status: Home / Self Care | Payer: Medicare Other | Source: Ambulatory Visit | Attending: Vascular Surgery | Admitting: Vascular Surgery

## 2017-08-21 VITALS — BP 177/86 | HR 79 | Temp 98.5°F | Resp 18 | Ht 64.0 in | Wt 122.0 lb

## 2017-08-21 DIAGNOSIS — L97929 Non-pressure chronic ulcer of unspecified part of left lower leg with unspecified severity: Secondary | ICD-10-CM | POA: Diagnosis not present

## 2017-08-21 DIAGNOSIS — L97911 Non-pressure chronic ulcer of unspecified part of right lower leg limited to breakdown of skin: Secondary | ICD-10-CM | POA: Diagnosis not present

## 2017-08-21 DIAGNOSIS — I83229 Varicose veins of left lower extremity with both ulcer of unspecified site and inflammation: Secondary | ICD-10-CM

## 2017-08-21 DIAGNOSIS — I7025 Atherosclerosis of native arteries of other extremities with ulceration: Secondary | ICD-10-CM | POA: Diagnosis not present

## 2017-08-21 DIAGNOSIS — I83219 Varicose veins of right lower extremity with both ulcer of unspecified site and inflammation: Secondary | ICD-10-CM | POA: Diagnosis not present

## 2017-08-21 DIAGNOSIS — L97921 Non-pressure chronic ulcer of unspecified part of left lower leg limited to breakdown of skin: Secondary | ICD-10-CM | POA: Diagnosis not present

## 2017-08-21 DIAGNOSIS — L97919 Non-pressure chronic ulcer of unspecified part of right lower leg with unspecified severity: Secondary | ICD-10-CM

## 2017-08-21 MED ORDER — ENOXAPARIN SODIUM 60 MG/0.6ML ~~LOC~~ SOLN: 60.0000 mg | SUBCUTANEOUS | 0 refills | Status: DC

## 2017-08-21 NOTE — Telephone Encounter (Signed)
-----   Message from Erskine Emery, [facility] sent at 08/21/2017 12:59 PM EST ----- Regarding: RE: Coumadin management/ ASAP Hi Conan Bowens you had a good new year! Yes that is correct for dialysis we generally do the 1mg /kg daily. I would use lovenox 60mg  for her given her weight of 55kg. Usually do a 36 - 48 hour washout period as well. If procedure is scheduled for morning of 1/10 would do evening dosing of lovenox prior to procedure and have last dose on 1/8 evening. Let me know if you have any other questions. I will try to call you again in a few minutes or you can call me! Thanks! Georgina Peer  ----- Message ----- From: Malen Gauze, RN Sent: 08/21/2017  12:27 PM To: Erskine Emery, [facility] Subject: RE: Coumadin management/ ASAP                  Please advise ASAP:  Pt has to stop coumadin tonight. No recent labs!  On dialysis    Will need Renal dose:  Lovenox 60mg  daily based on Wt 55.3kg ?? I'm in [place].  [phone]  ----- Message ----- From: Willy Eddy, RN Sent: 08/21/2017  10:56 AM To: Malen Gauze, RN Subject: FW: Coumadin management/ ASAP                    ----- Message ----- From: Erma Heritage, PA-C Sent: 08/21/2017  10:20 AM To: Willy Eddy, RN, Malen Gauze, RN Subject: RE: Coumadin management/ ASAP                  At the time of her visit, she was doing well and denied any recent anginal symptoms. She is of low-risk for the procedure from a cardiac perspective.   Will have to clarify with the [facility] regarding Lovenox bridge as it appears in her chart she does have a history of prior CVA.   Best,  [place]   ----- Message ----- From: Willy Eddy, RN Sent: 08/21/2017  10:00 AM To: Malen Gauze, RN, Erma Heritage, PA-C Subject: Coumadin management/ ASAP                      Patient needs to have Aortogram on 08/27/16  Will need to hold Coumadin for 5 days pre-procedure. Request clearance and coverage with  Lovenox if necessary. Daughter is  waiting our call back. Please advice. Thank you Archivist

## 2017-08-21 NOTE — Telephone Encounter (Signed)
After speaking with Edrick Oh at [facility], Left a message on daughter's phone they will get a call from her regarding Lovenox coverage pre-procedure and hold Coumadin past today's dose.Per conversation with me,  Edrick Oh will manage this and notify patient.

## 2017-08-21 NOTE — H&P (View-Only) (Signed)
Requested by:  Rosita Fire, [place], [place] [id number]  Reason for consultation: bilateral leg wounds   History of Present Illness   Destiny Boyd is a 84 y.o. ([birth year]) female with ESRD and DM who presents with chief complaint: non-healing wound in both legs.  Onset of symptoms occurred months ago, with development of progressing darkened patches the skin overlying her L lateral calf and R ankle.  This is associated with pain.  Pain is described as sharp, severity 1-5/10, and associated with no obvious trigger.  Patient is intermittent.  Patient has attempted to treat this pain with self wound care involving hydrogen peroxide.  The patient has no rest pain symptoms.  She reportedly can walk one mile before getting any cramping.    Atherosclerotic risk factors include: HLD, HTN, DM, and former smoker.  Past Medical History:  Diagnosis Date  . 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 Colon cancer ([facility])    cecum cancer  . Diabetes mellitus   . ESRD (end stage renal disease) ([facility])   . Gout   . Hypercholesteremia   . Hypertension   . Iron deficiency anemia   . Leukopenia   . PAF (paroxysmal atrial fibrillation) (H[facility])    a. on Coumadin for anticoagulation  . Stroke [facility]) 2005    Past Surgical History:  Procedure Laterality Date  . ABDOMINAL HYSTERECTOMY    . AV FISTULA PLACEMENT Left 08/24/2013   Procedure: ARTERIOVENOUS (AV) FISTULA CREATION- LEFT BRACHIAL CEPHALIC;  Surgeon: Conrad Terral, MD;  Location: [facility];  Service: Vascular;  Laterality: Left;  . CATARACT EXTRACTION, BILATERAL     lens implants  . COLON SURGERY  2010  . COLONOSCOPY  2008   Dr. Oneida Alar: 3-4 cm cecal polyp partially removed, path noting cecal adenocarcinoma, underwent right hemicolectomy with TI resection  . COLONOSCOPY  2009   Dr. Oneida Alar: 86mm sessile rectal polyp (benign), rare diverticula  . ESOPHAGOGASTRODUODENOSCOPY  2008   Dr. Oneida Alar: normal esophagus  .  ESOPHAGOGASTRODUODENOSCOPY  2009   Dr. Oneida Alar: multiple antral erosions (reactive gastropathy), normal duodenum, normal esophagus  . EYE SURGERY    . GIVENS CAPSULE STUDY  2009   normal   . HEMICOLECTOMY  2008  . PERIPHERAL VASCULAR CATHETERIZATION N/A 03/03/2016   Procedure: Fistulagram;  Surgeon: Angelia Mould, MD;  Location: [facility];  Service: Cardiovascular;  Laterality: N/A;  . PERIPHERAL VASCULAR CATHETERIZATION Left 03/03/2016   Procedure: Peripheral Vascular Balloon Angioplasty;  Surgeon: Angelia Mould, MD;  Location: [facility];  Service: Cardiovascular;  Laterality: Left;  arm fistula pta    Social History   Socioeconomic History  . Marital status: Widowed    Spouse name: Not on file  . Number of children: Not on file  . Years of education: Not on file  . Highest education level: Not on file  Social Needs  . Financial resource strain: Not on file  . Food insecurity - worry: Not on file  . Food insecurity - inability: Not on file  . Transportation needs - medical: Not on file  . Transportation needs - non-medical: Not on file  Occupational History  . Not on file  Tobacco Use  . Smoking status: Former Smoker    Packs/day: 1.00    Years: 10.00    Pack years: 10.00    Types: Cigarettes    Last attempt to quit: 04/12/1985    Years since quitting: 32.3  . Smokeless tobacco:  Never Used  Substance and Sexual Activity  . Alcohol use: No    Alcohol/week: 0.0 oz  . Drug use: No  . Sexual activity: No  Other Topics Concern  . Not on file  Social History Narrative  . Not on file    Family History  Problem Relation Age of Onset  . Diabetes Mother   . Hypertension Mother   . Hyperlipidemia Daughter   . Varicose Veins Daughter   . Cancer Son   . Diabetes Son   . Heart disease Son   . Hyperlipidemia Son     Current Outpatient Medications  Medication Sig Dispense Refill  . diltiazem (CARDIZEM CD) 120 MG 24 hr capsule Take 240 mg (  2 tablets ) in th am, and take 120 mg ( 1 tablet)  in the pm 90 capsule 3  . multivitamin (RENA-VIT) TABS tablet Take 1 tablet by mouth daily.    . pantoprazole (PROTONIX) 40 MG tablet Take 1 tablet (40 mg total) by mouth daily. 30 tablet 3  . sevelamer carbonate (RENVELA) 800 MG tablet Take 3,200 mg by mouth 3 (three) times daily with meals. & 1600 by mouth with snacks    . TRADJENTA 5 MG TABS tablet Take 1 tablet by mouth daily.    . traMADol (ULTRAM) 50 MG tablet Take 50 mg by mouth 2 (two) times daily.    Marland Kitchen warfarin (COUMADIN) 5 MG tablet Take 5 mg by mouth daily.    Marland Kitchen warfarin (COUMADIN) 3 MG tablet Take 1 tablet by mouth daily.     No current facility-administered medications for this visit.     No Known Allergies  REVIEW OF SYSTEMS (negative unless checked):   Cardiac:  []  Chest pain or chest pressure? []  Shortness of breath upon activity? []  Shortness of breath when lying flat? []  Irregular heart rhythm?  Vascular:  []  Pain in calf, thigh, or hip brought on by walking? []  Pain in feet at night that wakes you up from your sleep? []  Blood clot in your veins? []  Leg swelling?  Pulmonary:  []  Oxygen at home? []  Productive cough? []  Wheezing?  Neurologic:  []  Sudden weakness in arms or legs? []  Sudden numbness in arms or legs? []  Sudden onset of difficult speaking or slurred speech? []  Temporary loss of vision in one eye? []  Problems with dizziness?  Gastrointestinal:  []  Blood in stool? []  Vomited blood?  Genitourinary:  []  Burning when urinating? []  Blood in urine?  Psychiatric:  []  Major depression  Hematologic:  []  Bleeding problems? []  Problems with blood clotting?  Dermatologic:  [x]  Rashes or ulcers?  Constitutional:  []  Fever or chills?  Ear/Nose/Throat:  []  Change in hearing? []  Nose bleeds? []  Sore throat?  Musculoskeletal:  []  Back pain? []  Joint pain? []  Muscle pain?   For VQI Use Only   PRE-ADM LIVING Home  AMB STATUS  Ambulatory  CAD Sx None  PRIOR CHF None  STRESS TEST No    Physical Examination     Vitals:   08/21/17 0849 08/21/17 0853  BP: (!) 176/85 (!) 177/86  Pulse: 79   Resp: 18   Temp: 98.5 F (36.9 C)   TempSrc: Oral   SpO2: 93%   Weight: 122 lb (55.3 kg)   Height: 5\' 4"  (1.626 m)    Body mass index is 20.94 kg/m.  General Alert, O x 3, WD, NAD  Head [place]/AT,    Ear/Nose/ Throat Hearing grossly intact, nares without erythema or drainage,  oropharynx without Erythema or Exudate, Mallampati score: 3,   Eyes PERRLA, EOMI,    Neck Supple, mid-line trachea,    Pulmonary Sym exp, good B air movt, CTA B  Cardiac RRR, Nl S1, S2, no Murmurs, No rubs, No S3,S4  Vascular Vessel Right Left  Radial Palpable Palpable  Brachial Palpable Palpable  Carotid Palpable, No Bruit Palpable, No Bruit  Aorta Not palpable N/A  Femoral Palpable Palpable  Popliteal Not palpable Not palpable  PT Not palpable Not palpable  DP Not palpable Not palpable    Gastro- intestinal soft, non-distended, non-tender to palpation, No guarding or rebound, no HSM, no masses, no CVAT B, No palpable prominent aortic pulse,    Musculo- skeletal M/S 5/5 throughout  , Extremities without ischemic changes except two patches of skin on lateral calf with black eschar suspicious for calciphylaxis, R ankle has a smaller similar patch, No edema present, No visible varicosities , No Lipodermatosclerosis present  Neurologic Cranial nerves 2-12 intact , Pain and light touch intact in extremities , Motor exam as listed above  Psychiatric Judgement intact, Mood & affect appropriate for pt's clinical situation  Dermatologic See M/S exam for extremity exam, No rashes otherwise noted  Lymphatic  Palpable lymph nodes: None    Non-Invasive Vascular Imaging   ABI (08/21/2017)  R:   ABI: [place],   PT: mono  DP: mono  TBI:  0.36  L:   ABI: [place],   PT: mono  DP: mono  TBI: 0.24   Outside Studies/Documentation   4 pages of  outside documents were reviewed including: outpatient PCP chart.   Medical Decision Making   Destiny Boyd is a 84 y.o. 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  I would recommend: Aortogram, Bilateral runoff and possible LLE intervention.  Optimizing perfusion to both limbs prior to any surgical excision would give her a better chance of healing. I discussed with the patient the nature of angiographic procedures, especially the limited patencies of any endovascular intervention. The patient is aware of that the risks of an angiographic procedure include but are not limited to: bleeding, infection, access site complications, embolization, rupture of treated vessel, dissection, possible need for emergent surgical intervention, and possible need for surgical procedures to treat the patient's pathology. The patient is aware of the risks and agrees to proceed.  The procedure is scheduled for: 10 JAN 19.  I discussed in depth with the patient the nature of atherosclerosis, and emphasized the importance of maximal medical management including strict control of blood pressure, blood glucose, and lipid levels, antiplatelet agents, obtaining regular exercise, and cessation of smoking.  The patient is aware that without maximal medical management the underlying atherosclerotic disease process will progress, limiting the benefit of any interventions. The patient is currently not on on statin as not medically indicated.  The patient is currently not on an anti-platelet due to bleeding risks related to use of an anticoagulant.  Pt is on Warfarin.  Thank you for allowing [place] to participate in this patient's care.   Adele Barthel, MD, FACS [facility] Office: [phone] Pager: [phone]  08/21/2017, 9:27 AM

## 2017-08-21 NOTE — Telephone Encounter (Signed)
-----   Message from Erma Heritage, [place] sent at 08/21/2017 10:20 AM EST ----- Regarding: RE: Coumadin management/ ASAP At the time of her visit, she was doing well and denied any recent anginal symptoms. She is of low-risk for the procedure from a cardiac perspective.   Will have to clarify with the Coumadin Clinic regarding Lovenox bridge as it appears in her chart she does have a history of prior CVA.   Best,  [place]   ----- Message ----- From: Willy Eddy, RN Sent: 08/21/2017  10:00 AM To: Malen Gauze, RN, Erma Heritage, PA-C Subject: Coumadin management/ ASAP                      Patient needs to have Aortogram on 08/27/16  Will need to hold Coumadin for 5 days pre-procedure. Request clearance and coverage with  Lovenox if necessary. Daughter is waiting our call back. Please advice. Thank you Archivist

## 2017-08-21 NOTE — Telephone Encounter (Signed)
-----   Message from Malen Gauze, RN sent at 08/21/2017  3:52 PM EST ----- Regarding: RE: Coumadin management/ ASAP Becky, I have everything worked out for Ms. Wunschel but daughter does not answer phone at this time.  Will keep trying.   ----- Message ----- From: Willy Eddy, RN Sent: 08/21/2017  10:56 AM To: Malen Gauze, RN Subject: FW: Coumadin management/ ASAP                    ----- Message ----- From: Erma Heritage, PA-C Sent: 08/21/2017  10:20 AM To: Willy Eddy, RN, Malen Gauze, RN Subject: RE: Coumadin management/ ASAP                  At the time of her visit, she was doing well and denied any recent anginal symptoms. She is of low-risk for the procedure from a cardiac perspective.   Will have to clarify with the Coumadin Clinic regarding Lovenox bridge as it appears in her chart she does have a history of prior CVA.   Best,  [place]   ----- Message ----- From: Willy Eddy, RN Sent: 08/21/2017  10:00 AM To: Malen Gauze, RN, Erma Heritage, PA-C Subject: Coumadin management/ ASAP                      Patient needs to have Aortogram on 08/27/16  Will need to hold Coumadin for 5 days pre-procedure. Request clearance and coverage with  Lovenox if necessary. Daughter is waiting our call back. Please advice. Thank you Archivist

## 2017-08-21 NOTE — Progress Notes (Signed)
Requested by:  Rosita Fire, [place], [place] [id number]  Reason for consultation: bilateral leg wounds   History of Present Illness   Destiny Boyd is a 84 y.o. (09/10/1933) female with ESRD and DM who presents with chief complaint: non-healing wound in both legs.  Onset of symptoms occurred months ago, with development of progressing darkened patches the skin overlying her L lateral calf and R ankle.  This is associated with pain.  Pain is described as sharp, severity 1-5/10, and associated with no obvious trigger.  Patient is intermittent.  Patient has attempted to treat this pain with self wound care involving hydrogen peroxide.  The patient has no rest pain symptoms.  She reportedly can walk one mile before getting any cramping.    Atherosclerotic risk factors include: HLD, HTN, DM, and former smoker.  Past Medical History:  Diagnosis Date  . 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 Colon cancer ([facility])    cecum cancer  . Diabetes mellitus   . ESRD (end stage renal disease) ([facility])   . Gout   . Hypercholesteremia   . Hypertension   . Iron deficiency anemia   . Leukopenia   . PAF (paroxysmal atrial fibrillation) ([facility])    a. on Coumadin for anticoagulation  . Stroke [facility]) 2005    Past Surgical History:  Procedure Laterality Date  . ABDOMINAL HYSTERECTOMY    . AV FISTULA PLACEMENT Left 08/24/2013   Procedure: ARTERIOVENOUS (AV) FISTULA CREATION- LEFT BRACHIAL CEPHALIC;  Surgeon: Conrad [place], MD;  Location: [facility];  Service: Vascular;  Laterality: Left;  . CATARACT EXTRACTION, BILATERAL     lens implants  . COLON SURGERY  2010  . COLONOSCOPY  2008   Dr. Oneida Alar: 3-4 cm cecal polyp partially removed, path noting cecal adenocarcinoma, underwent right hemicolectomy with TI resection  . COLONOSCOPY  2009   Dr. Oneida Alar: 63mm sessile rectal polyp (benign), rare diverticula  . ESOPHAGOGASTRODUODENOSCOPY  2008   Dr. Oneida Alar: normal esophagus  .  ESOPHAGOGASTRODUODENOSCOPY  2009   Dr. Oneida Alar: multiple antral erosions (reactive gastropathy), normal duodenum, normal esophagus  . EYE SURGERY    . GIVENS CAPSULE STUDY  2009   normal   . HEMICOLECTOMY  2008  . PERIPHERAL VASCULAR CATHETERIZATION N/A 03/03/2016   Procedure: Fistulagram;  Surgeon: Angelia Mould, MD;  Location: [facility];  Service: Cardiovascular;  Laterality: N/A;  . PERIPHERAL VASCULAR CATHETERIZATION Left 03/03/2016   Procedure: Peripheral Vascular Balloon Angioplasty;  Surgeon: Angelia Mould, MD;  Location: [facility];  Service: Cardiovascular;  Laterality: Left;  arm fistula pta    Social History   Socioeconomic History  . Marital status: Widowed    Spouse name: Not on file  . Number of children: Not on file  . Years of education: Not on file  . Highest education level: Not on file  Social Needs  . Financial resource strain: Not on file  . Food insecurity - worry: Not on file  . Food insecurity - inability: Not on file  . Transportation needs - medical: Not on file  . Transportation needs - non-medical: Not on file  Occupational History  . Not on file  Tobacco Use  . Smoking status: Former Smoker    Packs/day: 1.00    Years: 10.00    Pack years: 10.00    Types: Cigarettes    Last attempt to quit: 04/12/1985    Years since quitting: 32.3  . Smokeless tobacco:  Never Used  Substance and Sexual Activity  . Alcohol use: No    Alcohol/week: 0.0 oz  . Drug use: No  . Sexual activity: No  Other Topics Concern  . Not on file  Social History Narrative  . Not on file    Family History  Problem Relation Age of Onset  . Diabetes Mother   . Hypertension Mother   . Hyperlipidemia Daughter   . Varicose Veins Daughter   . Cancer Son   . Diabetes Son   . Heart disease Son   . Hyperlipidemia Son     Current Outpatient Medications  Medication Sig Dispense Refill  . diltiazem (CARDIZEM CD) 120 MG 24 hr capsule Take 240 mg (  2 tablets ) in th am, and take 120 mg ( 1 tablet)  in the pm 90 capsule 3  . multivitamin (RENA-VIT) TABS tablet Take 1 tablet by mouth daily.    . pantoprazole (PROTONIX) 40 MG tablet Take 1 tablet (40 mg total) by mouth daily. 30 tablet 3  . sevelamer carbonate (RENVELA) 800 MG tablet Take 3,200 mg by mouth 3 (three) times daily with meals. & 1600 by mouth with snacks    . TRADJENTA 5 MG TABS tablet Take 1 tablet by mouth daily.    . traMADol (ULTRAM) 50 MG tablet Take 50 mg by mouth 2 (two) times daily.    Marland Kitchen warfarin (COUMADIN) 5 MG tablet Take 5 mg by mouth daily.    Marland Kitchen warfarin (COUMADIN) 3 MG tablet Take 1 tablet by mouth daily.     No current facility-administered medications for this visit.     No Known Allergies  REVIEW OF SYSTEMS (negative unless checked):   Cardiac:  []  Chest pain or chest pressure? []  Shortness of breath upon activity? []  Shortness of breath when lying flat? []  Irregular heart rhythm?  Vascular:  []  Pain in calf, thigh, or hip brought on by walking? []  Pain in feet at night that wakes you up from your sleep? []  Blood clot in your veins? []  Leg swelling?  Pulmonary:  []  Oxygen at home? []  Productive cough? []  Wheezing?  Neurologic:  []  Sudden weakness in arms or legs? []  Sudden numbness in arms or legs? []  Sudden onset of difficult speaking or slurred speech? []  Temporary loss of vision in one eye? []  Problems with dizziness?  Gastrointestinal:  []  Blood in stool? []  Vomited blood?  Genitourinary:  []  Burning when urinating? []  Blood in urine?  Psychiatric:  []  Major depression  Hematologic:  []  Bleeding problems? []  Problems with blood clotting?  Dermatologic:  [x]  Rashes or ulcers?  Constitutional:  []  Fever or chills?  Ear/Nose/Throat:  []  Change in hearing? []  Nose bleeds? []  Sore throat?  Musculoskeletal:  []  Back pain? []  Joint pain? []  Muscle pain?   For VQI Use Only   PRE-ADM LIVING Home  AMB STATUS  Ambulatory  CAD Sx None  PRIOR CHF None  STRESS TEST No    Physical Examination     Vitals:   08/21/17 0849 08/21/17 0853  BP: (!) 176/85 (!) 177/86  Pulse: 79   Resp: 18   Temp: 98.5 F (36.9 C)   TempSrc: Oral   SpO2: 93%   Weight: 122 lb (55.3 kg)   Height: 5\' 4"  (1.626 m)    Body mass index is 20.94 kg/m.  General Alert, O x 3, WD, NAD  Head [place]/AT,    Ear/Nose/ Throat Hearing grossly intact, nares without erythema or drainage,  oropharynx without Erythema or Exudate, Mallampati score: 3,   Eyes PERRLA, EOMI,    Neck Supple, mid-line trachea,    Pulmonary Sym exp, good B air movt, CTA B  Cardiac RRR, Nl S1, S2, no Murmurs, No rubs, No S3,S4  Vascular Vessel Right Left  Radial Palpable Palpable  Brachial Palpable Palpable  Carotid Palpable, No Bruit Palpable, No Bruit  Aorta Not palpable N/A  Femoral Palpable Palpable  Popliteal Not palpable Not palpable  PT Not palpable Not palpable  DP Not palpable Not palpable    Gastro- intestinal soft, non-distended, non-tender to palpation, No guarding or rebound, no HSM, no masses, no CVAT B, No palpable prominent aortic pulse,    Musculo- skeletal M/S 5/5 throughout  , Extremities without ischemic changes except two patches of skin on lateral calf with black eschar suspicious for calciphylaxis, R ankle has a smaller similar patch, No edema present, No visible varicosities , No Lipodermatosclerosis present  Neurologic Cranial nerves 2-12 intact , Pain and light touch intact in extremities , Motor exam as listed above  Psychiatric Judgement intact, Mood & affect appropriate for pt's clinical situation  Dermatologic See M/S exam for extremity exam, No rashes otherwise noted  Lymphatic  Palpable lymph nodes: None    Non-Invasive Vascular Imaging   ABI (08/21/2017)  R:   ABI: [place],   PT: mono  DP: mono  TBI:  0.36  L:   ABI: [place],   PT: mono  DP: mono  TBI: 0.24   Outside Studies/Documentation   4 pages of  outside documents were reviewed including: outpatient PCP chart.   Medical Decision Making   Destiny Boyd is a 84 y.o. 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  I would recommend: Aortogram, Bilateral runoff and possible LLE intervention.  Optimizing perfusion to both limbs prior to any surgical excision would give her a better chance of healing. I discussed with the patient the nature of angiographic procedures, especially the limited patencies of any endovascular intervention. The patient is aware of that the risks of an angiographic procedure include but are not limited to: bleeding, infection, access site complications, embolization, rupture of treated vessel, dissection, possible need for emergent surgical intervention, and possible need for surgical procedures to treat the patient's pathology. The patient is aware of the risks and agrees to proceed.  The procedure is scheduled for: 10 JAN 19.  I discussed in depth with the patient the nature of atherosclerosis, and emphasized the importance of maximal medical management including strict control of blood pressure, blood glucose, and lipid levels, antiplatelet agents, obtaining regular exercise, and cessation of smoking.  The patient is aware that without maximal medical management the underlying atherosclerotic disease process will progress, limiting the benefit of any interventions. The patient is currently not on on statin as not medically indicated.  The patient is currently not on an anti-platelet due to bleeding risks related to use of an anticoagulant.  Pt is on Warfarin.  Thank you for allowing [place] to participate in this patient's care.   Adele Barthel, MD, FACS [facility] Office: [phone] Pager: [phone]  08/21/2017, 9:27 AM

## 2017-08-21 NOTE — Telephone Encounter (Signed)
-----   Message from Malen Gauze, RN sent at 08/21/2017  4:29 PM EST ----- Regarding: RE: Coumadin management/ ASAP Spoke with pt's daughter [name].  Emailed her copy of lovenox instructions. ----- Message ----- From: Willy Eddy, RN Sent: 08/21/2017  10:56 AM To: Malen Gauze, RN Subject: FW: Coumadin management/ ASAP                    ----- Message ----- From: Erma Heritage, PA-C Sent: 08/21/2017  10:20 AM To: Willy Eddy, RN, Malen Gauze, RN Subject: RE: Coumadin management/ ASAP                  At the time of her visit, she was doing well and denied any recent anginal symptoms. She is of low-risk for the procedure from a cardiac perspective.   Will have to clarify with the Coumadin Clinic regarding Lovenox bridge as it appears in her chart she does have a history of prior CVA.   Best,  [place]   ----- Message ----- From: Willy Eddy, RN Sent: 08/21/2017  10:00 AM To: Malen Gauze, RN, Erma Heritage, PA-C Subject: Coumadin management/ ASAP                      Patient needs to have Aortogram on 08/27/16  Will need to hold Coumadin for 5 days pre-procedure. Request clearance and coverage with  Lovenox if necessary. Daughter is waiting our call back. Please advice. Thank you Archivist

## 2017-08-21 NOTE — H&P (View-Only) (Signed)
Requested by:  Rosita Fire, [place], [place] [id number]  Reason for consultation: bilateral leg wounds   History of Present Illness   Destiny Boyd is a 84 y.o. ([birth year]) female with ESRD and DM who presents with chief complaint: non-healing wound in both legs.  Onset of symptoms occurred months ago, with development of progressing darkened patches the skin overlying her L lateral calf and R ankle.  This is associated with pain.  Pain is described as sharp, severity 1-5/10, and associated with no obvious trigger.  Patient is intermittent.  Patient has attempted to treat this pain with self wound care involving hydrogen peroxide.  The patient has no rest pain symptoms.  She reportedly can walk one mile before getting any cramping.    Atherosclerotic risk factors include: HLD, HTN, DM, and former smoker.  Past Medical History:  Diagnosis Date  . 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 Colon cancer ([facility])    cecum cancer  . Diabetes mellitus   . ESRD (end stage renal disease) ([facility])   . Gout   . Hypercholesteremia   . Hypertension   . Iron deficiency anemia   . Leukopenia   . PAF (paroxysmal atrial fibrillation) ([facility])    a. on Coumadin for anticoagulation  . Stroke [facility]) 2005    Past Surgical History:  Procedure Laterality Date  . ABDOMINAL HYSTERECTOMY    . AV FISTULA PLACEMENT Left 08/24/2013   Procedure: ARTERIOVENOUS (AV) FISTULA CREATION- LEFT BRACHIAL CEPHALIC;  Surgeon: Conrad Dixie, MD;  Location: [facility];  Service: Vascular;  Laterality: Left;  . CATARACT EXTRACTION, BILATERAL     lens implants  . COLON SURGERY  2010  . COLONOSCOPY  2008   Dr. Oneida Alar: 3-4 cm cecal polyp partially removed, path noting cecal adenocarcinoma, underwent right hemicolectomy with TI resection  . COLONOSCOPY  2009   Dr. Oneida Alar: 21mm sessile rectal polyp (benign), rare diverticula  . ESOPHAGOGASTRODUODENOSCOPY  2008   Dr. Oneida Alar: normal esophagus  .  ESOPHAGOGASTRODUODENOSCOPY  2009   Dr. Oneida Alar: multiple antral erosions (reactive gastropathy), normal duodenum, normal esophagus  . EYE SURGERY    . GIVENS CAPSULE STUDY  2009   normal   . HEMICOLECTOMY  2008  . PERIPHERAL VASCULAR CATHETERIZATION N/A 03/03/2016   Procedure: Fistulagram;  Surgeon: Angelia Mould, MD;  Location: [facility];  Service: Cardiovascular;  Laterality: N/A;  . PERIPHERAL VASCULAR CATHETERIZATION Left 03/03/2016   Procedure: Peripheral Vascular Balloon Angioplasty;  Surgeon: Angelia Mould, MD;  Location: [facility];  Service: Cardiovascular;  Laterality: Left;  arm fistula pta    Social History   Socioeconomic History  . Marital status: Widowed    Spouse name: Not on file  . Number of children: Not on file  . Years of education: Not on file  . Highest education level: Not on file  Social Needs  . Financial resource strain: Not on file  . Food insecurity - worry: Not on file  . Food insecurity - inability: Not on file  . Transportation needs - medical: Not on file  . Transportation needs - non-medical: Not on file  Occupational History  . Not on file  Tobacco Use  . Smoking status: Former Smoker    Packs/day: 1.00    Years: 10.00    Pack years: 10.00    Types: Cigarettes    Last attempt to quit: 04/12/1985    Years since quitting: 32.3  . Smokeless tobacco:  Never Used  Substance and Sexual Activity  . Alcohol use: No    Alcohol/week: 0.0 oz  . Drug use: No  . Sexual activity: No  Other Topics Concern  . Not on file  Social History Narrative  . Not on file    Family History  Problem Relation Age of Onset  . Diabetes Mother   . Hypertension Mother   . Hyperlipidemia Daughter   . Varicose Veins Daughter   . Cancer Son   . Diabetes Son   . Heart disease Son   . Hyperlipidemia Son     Current Outpatient Medications  Medication Sig Dispense Refill  . diltiazem (CARDIZEM CD) 120 MG 24 hr capsule Take 240 mg (  2 tablets ) in th am, and take 120 mg ( 1 tablet)  in the pm 90 capsule 3  . multivitamin (RENA-VIT) TABS tablet Take 1 tablet by mouth daily.    . pantoprazole (PROTONIX) 40 MG tablet Take 1 tablet (40 mg total) by mouth daily. 30 tablet 3  . sevelamer carbonate (RENVELA) 800 MG tablet Take 3,200 mg by mouth 3 (three) times daily with meals. & 1600 by mouth with snacks    . TRADJENTA 5 MG TABS tablet Take 1 tablet by mouth daily.    . traMADol (ULTRAM) 50 MG tablet Take 50 mg by mouth 2 (two) times daily.    Marland Kitchen warfarin (COUMADIN) 5 MG tablet Take 5 mg by mouth daily.    Marland Kitchen warfarin (COUMADIN) 3 MG tablet Take 1 tablet by mouth daily.     No current facility-administered medications for this visit.     No Known Allergies  REVIEW OF SYSTEMS (negative unless checked):   Cardiac:  []  Chest pain or chest pressure? []  Shortness of breath upon activity? []  Shortness of breath when lying flat? []  Irregular heart rhythm?  Vascular:  []  Pain in calf, thigh, or hip brought on by walking? []  Pain in feet at night that wakes you up from your sleep? []  Blood clot in your veins? []  Leg swelling?  Pulmonary:  []  Oxygen at home? []  Productive cough? []  Wheezing?  Neurologic:  []  Sudden weakness in arms or legs? []  Sudden numbness in arms or legs? []  Sudden onset of difficult speaking or slurred speech? []  Temporary loss of vision in one eye? []  Problems with dizziness?  Gastrointestinal:  []  Blood in stool? []  Vomited blood?  Genitourinary:  []  Burning when urinating? []  Blood in urine?  Psychiatric:  []  Major depression  Hematologic:  []  Bleeding problems? []  Problems with blood clotting?  Dermatologic:  [x]  Rashes or ulcers?  Constitutional:  []  Fever or chills?  Ear/Nose/Throat:  []  Change in hearing? []  Nose bleeds? []  Sore throat?  Musculoskeletal:  []  Back pain? []  Joint pain? []  Muscle pain?   For VQI Use Only   PRE-ADM LIVING Home  AMB STATUS  Ambulatory  CAD Sx None  PRIOR CHF None  STRESS TEST No    Physical Examination     Vitals:   08/21/17 0849 08/21/17 0853  BP: (!) 176/85 (!) 177/86  Pulse: 79   Resp: 18   Temp: 98.5 F (36.9 C)   TempSrc: Oral   SpO2: 93%   Weight: 122 lb (55.3 kg)   Height: 5\' 4"  (1.626 m)    Body mass index is 20.94 kg/m.  General Alert, O x 3, WD, NAD  Head /AT,    Ear/Nose/ Throat Hearing grossly intact, nares without erythema or drainage,  oropharynx without Erythema or Exudate, Mallampati score: 3,   Eyes PERRLA, EOMI,    Neck Supple, mid-line trachea,    Pulmonary Sym exp, good B air movt, CTA B  Cardiac RRR, Nl S1, S2, no Murmurs, No rubs, No S3,S4  Vascular Vessel Right Left  Radial Palpable Palpable  Brachial Palpable Palpable  Carotid Palpable, No Bruit Palpable, No Bruit  Aorta Not palpable N/A  Femoral Palpable Palpable  Popliteal Not palpable Not palpable  PT Not palpable Not palpable  DP Not palpable Not palpable    Gastro- intestinal soft, non-distended, non-tender to palpation, No guarding or rebound, no HSM, no masses, no CVAT B, No palpable prominent aortic pulse,    Musculo- skeletal M/S 5/5 throughout  , Extremities without ischemic changes except two patches of skin on lateral calf with black eschar suspicious for calciphylaxis, R ankle has a smaller similar patch, No edema present, No visible varicosities , No Lipodermatosclerosis present  Neurologic Cranial nerves 2-12 intact , Pain and light touch intact in extremities , Motor exam as listed above  Psychiatric Judgement intact, Mood & affect appropriate for pt's clinical situation  Dermatologic See M/S exam for extremity exam, No rashes otherwise noted  Lymphatic  Palpable lymph nodes: None    Non-Invasive Vascular Imaging   ABI (08/21/2017)  R:   ABI: [place],   PT: mono  DP: mono  TBI:  0.36  L:   ABI: [place],   PT: mono  DP: mono  TBI: 0.24   Outside Studies/Documentation   4 pages of  outside documents were reviewed including: outpatient PCP chart.   Medical Decision Making   Suzane MYLI PAE is a 84 y.o. 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  I would recommend: Aortogram, Bilateral runoff and possible LLE intervention.  Optimizing perfusion to both limbs prior to any surgical excision would give her a better chance of healing. I discussed with the patient the nature of angiographic procedures, especially the limited patencies of any endovascular intervention. The patient is aware of that the risks of an angiographic procedure include but are not limited to: bleeding, infection, access site complications, embolization, rupture of treated vessel, dissection, possible need for emergent surgical intervention, and possible need for surgical procedures to treat the patient's pathology. The patient is aware of the risks and agrees to proceed.  The procedure is scheduled for: 10 JAN 19.  I discussed in depth with the patient the nature of atherosclerosis, and emphasized the importance of maximal medical management including strict control of blood pressure, blood glucose, and lipid levels, antiplatelet agents, obtaining regular exercise, and cessation of smoking.  The patient is aware that without maximal medical management the underlying atherosclerotic disease process will progress, limiting the benefit of any interventions. The patient is currently not on on statin as not medically indicated.  The patient is currently not on an anti-platelet due to bleeding risks related to use of an anticoagulant.  Pt is on Warfarin.  Thank you for allowing [place] to participate in this patient's care.   Adele Barthel, MD, FACS [facility] Office: [phone] Pager: [phone]  08/21/2017, 9:27 AM

## 2017-08-22 DIAGNOSIS — N186 End stage renal disease: Secondary | ICD-10-CM | POA: Diagnosis not present

## 2017-08-22 DIAGNOSIS — N2581 Secondary hyperparathyroidism of renal origin: Secondary | ICD-10-CM | POA: Diagnosis not present

## 2017-08-22 DIAGNOSIS — D631 Anemia in chronic kidney disease: Secondary | ICD-10-CM | POA: Diagnosis not present

## 2017-08-22 DIAGNOSIS — D509 Iron deficiency anemia, unspecified: Secondary | ICD-10-CM | POA: Diagnosis not present

## 2017-08-22 DIAGNOSIS — Z992 Dependence on renal dialysis: Secondary | ICD-10-CM | POA: Diagnosis not present

## 2017-08-25 DIAGNOSIS — N186 End stage renal disease: Secondary | ICD-10-CM | POA: Diagnosis not present

## 2017-08-25 DIAGNOSIS — Z992 Dependence on renal dialysis: Secondary | ICD-10-CM | POA: Diagnosis not present

## 2017-08-25 DIAGNOSIS — D509 Iron deficiency anemia, unspecified: Secondary | ICD-10-CM | POA: Diagnosis not present

## 2017-08-25 DIAGNOSIS — D631 Anemia in chronic kidney disease: Secondary | ICD-10-CM | POA: Diagnosis not present

## 2017-08-25 DIAGNOSIS — N2581 Secondary hyperparathyroidism of renal origin: Secondary | ICD-10-CM | POA: Diagnosis not present

## 2017-08-26 ENCOUNTER — Telehealth: Payer: Self-pay | Admitting: *Deleted

## 2017-08-26 DIAGNOSIS — N186 End stage renal disease: Secondary | ICD-10-CM | POA: Diagnosis not present

## 2017-08-26 DIAGNOSIS — D509 Iron deficiency anemia, unspecified: Secondary | ICD-10-CM | POA: Diagnosis not present

## 2017-08-26 DIAGNOSIS — Z992 Dependence on renal dialysis: Secondary | ICD-10-CM | POA: Diagnosis not present

## 2017-08-26 DIAGNOSIS — N2581 Secondary hyperparathyroidism of renal origin: Secondary | ICD-10-CM | POA: Diagnosis not present

## 2017-08-26 DIAGNOSIS — D631 Anemia in chronic kidney disease: Secondary | ICD-10-CM | POA: Diagnosis not present

## 2017-08-26 NOTE — Telephone Encounter (Signed)
Spoke with patient's daughter Procedure time changed and patient to arrive at hospital at [facility] 08/27/17. NPO past MN except for heart and blood pressure medications with sips of water.

## 2017-08-27 ENCOUNTER — Ambulatory Visit (HOSPITAL_COMMUNITY)
Admission: RE | Admit: 2017-08-27 | Discharge: 2017-08-27 | Disposition: A | Discharge status: Home / Self Care | Payer: Medicare Other | Source: Ambulatory Visit | Attending: Vascular Surgery | Admitting: Vascular Surgery

## 2017-08-27 ENCOUNTER — Encounter (HOSPITAL_COMMUNITY)
Admission: RE | Disposition: A | Discharge status: Home / Self Care | Payer: Self-pay | Source: Ambulatory Visit | Attending: Vascular Surgery

## 2017-08-27 ENCOUNTER — Other Ambulatory Visit: Payer: Self-pay | Admitting: *Deleted

## 2017-08-27 DIAGNOSIS — Z7901 Long term (current) use of anticoagulants: Secondary | ICD-10-CM | POA: Diagnosis not present

## 2017-08-27 DIAGNOSIS — L97921 Non-pressure chronic ulcer of unspecified part of left lower leg limited to breakdown of skin: Secondary | ICD-10-CM

## 2017-08-27 DIAGNOSIS — E11622 Type 2 diabetes mellitus with other skin ulcer: Secondary | ICD-10-CM | POA: Diagnosis not present

## 2017-08-27 DIAGNOSIS — I480 Paroxysmal atrial fibrillation: Secondary | ICD-10-CM | POA: Insufficient documentation

## 2017-08-27 DIAGNOSIS — Z87891 Personal history of nicotine dependence: Secondary | ICD-10-CM | POA: Diagnosis not present

## 2017-08-27 DIAGNOSIS — I120 Hypertensive chronic kidney disease with stage 5 chronic kidney disease or end stage renal disease: Secondary | ICD-10-CM | POA: Insufficient documentation

## 2017-08-27 DIAGNOSIS — L97919 Non-pressure chronic ulcer of unspecified part of right lower leg with unspecified severity: Secondary | ICD-10-CM | POA: Insufficient documentation

## 2017-08-27 DIAGNOSIS — Z79899 Other long term (current) drug therapy: Secondary | ICD-10-CM | POA: Insufficient documentation

## 2017-08-27 DIAGNOSIS — Z8673 Personal history of transient ischemic attack (TIA), and cerebral infarction without residual deficits: Secondary | ICD-10-CM | POA: Insufficient documentation

## 2017-08-27 DIAGNOSIS — E7800 Pure hypercholesterolemia, unspecified: Secondary | ICD-10-CM | POA: Diagnosis not present

## 2017-08-27 DIAGNOSIS — L97929 Non-pressure chronic ulcer of unspecified part of left lower leg with unspecified severity: Secondary | ICD-10-CM | POA: Insufficient documentation

## 2017-08-27 DIAGNOSIS — N184 Chronic kidney disease, stage 4 (severe): Secondary | ICD-10-CM | POA: Diagnosis not present

## 2017-08-27 DIAGNOSIS — E1151 Type 2 diabetes mellitus with diabetic peripheral angiopathy without gangrene: Secondary | ICD-10-CM | POA: Insufficient documentation

## 2017-08-27 DIAGNOSIS — E1122 Type 2 diabetes mellitus with diabetic chronic kidney disease: Secondary | ICD-10-CM | POA: Diagnosis not present

## 2017-08-27 DIAGNOSIS — I7025 Atherosclerosis of native arteries of other extremities with ulceration: Secondary | ICD-10-CM | POA: Diagnosis not present

## 2017-08-27 DIAGNOSIS — N186 End stage renal disease: Secondary | ICD-10-CM | POA: Diagnosis not present

## 2017-08-27 DIAGNOSIS — Z85038 Personal history of other malignant neoplasm of large intestine: Secondary | ICD-10-CM | POA: Insufficient documentation

## 2017-08-27 DIAGNOSIS — E119 Type 2 diabetes mellitus without complications: Secondary | ICD-10-CM | POA: Diagnosis not present

## 2017-08-27 HISTORY — PX: ABDOMINAL AORTOGRAM W/LOWER EXTREMITY: CATH118223

## 2017-08-27 LAB — POCT I-STAT, CHEM 8
BUN: 20 mg/dL (ref 6–20)
CHLORIDE: 99 mmol/L — AB (ref 101–111)
Calcium, Ion: 1.18 mmol/L (ref 1.15–1.40)
Creatinine, Ser: 5 mg/dL — ABNORMAL HIGH (ref 0.44–1.00)
Glucose, Bld: 142 mg/dL — ABNORMAL HIGH (ref 65–99)
HEMATOCRIT: 37 % (ref 36.0–46.0)
Hemoglobin: 12.6 g/dL (ref 12.0–15.0)
POTASSIUM: 3.9 mmol/L (ref 3.5–5.1)
SODIUM: 140 mmol/L (ref 135–145)
TCO2: 31 mmol/L (ref 22–32)

## 2017-08-27 LAB — PROTIME-INR
INR: 1.07
Prothrombin Time: 13.8 seconds (ref 11.4–15.2)

## 2017-08-27 LAB — GLUCOSE, CAPILLARY: GLUCOSE-CAPILLARY: 110 mg/dL — AB (ref 65–99)

## 2017-08-27 SURGERY — ABDOMINAL AORTOGRAM W/LOWER EXTREMITY
Anesthesia: LOCAL

## 2017-08-27 MED ORDER — MIDAZOLAM HCL 2 MG/2ML IJ SOLN
INTRAMUSCULAR | Status: AC
  Filled 2017-08-27: qty 2

## 2017-08-27 MED ORDER — LABETALOL HCL 5 MG/ML IV SOLN: 10.0000 mg | INTRAVENOUS | Status: DC | PRN

## 2017-08-27 MED ORDER — IODIXANOL 320 MG/ML IV SOLN
INTRAVENOUS | Status: DC | PRN
  Administered 2017-08-27: 109 mL via INTRA_ARTERIAL

## 2017-08-27 MED ORDER — MORPHINE SULFATE (PF) 10 MG/ML IV SOLN: 2.0000 mg | INTRAVENOUS | Status: DC | PRN

## 2017-08-27 MED ORDER — HYDRALAZINE HCL 20 MG/ML IJ SOLN: 5.0000 mg | INTRAMUSCULAR | Status: DC | PRN

## 2017-08-27 MED ORDER — FENTANYL CITRATE (PF) 100 MCG/2ML IJ SOLN
INTRAMUSCULAR | Status: DC | PRN
  Administered 2017-08-27: 50 ug via INTRAVENOUS

## 2017-08-27 MED ORDER — HEPARIN (PORCINE) IN NACL 2-0.9 UNIT/ML-% IJ SOLN
INTRAMUSCULAR | Status: AC | PRN
  Administered 2017-08-27: 1000 mL via INTRA_ARTERIAL

## 2017-08-27 MED ORDER — SODIUM CHLORIDE 0.9 % IV SOLN: 250.0000 mL | INTRAVENOUS | Status: DC | PRN

## 2017-08-27 MED ORDER — SODIUM CHLORIDE 0.9% FLUSH: 3.0000 mL | INTRAVENOUS | Status: DC | PRN

## 2017-08-27 MED ORDER — HEPARIN (PORCINE) IN NACL 2-0.9 UNIT/ML-% IJ SOLN
INTRAMUSCULAR | Status: AC
  Filled 2017-08-27: qty 1000

## 2017-08-27 MED ORDER — OXYCODONE HCL 5 MG PO TABS: 5.0000 mg | ORAL_TABLET | ORAL | Status: DC | PRN

## 2017-08-27 MED ORDER — LIDOCAINE HCL (PF) 1 % IJ SOLN
INTRAMUSCULAR | Status: AC
  Filled 2017-08-27: qty 30

## 2017-08-27 MED ORDER — FENTANYL CITRATE (PF) 100 MCG/2ML IJ SOLN
INTRAMUSCULAR | Status: AC
  Filled 2017-08-27: qty 2

## 2017-08-27 MED ORDER — LIDOCAINE HCL (PF) 1 % IJ SOLN
INTRAMUSCULAR | Status: DC | PRN
  Administered 2017-08-27: 10 mL

## 2017-08-27 MED ORDER — MIDAZOLAM HCL 2 MG/2ML IJ SOLN
INTRAMUSCULAR | Status: DC | PRN
  Administered 2017-08-27: 1 mg via INTRAVENOUS

## 2017-08-27 MED ORDER — SODIUM CHLORIDE 0.9% FLUSH: 3.0000 mL | Freq: Two times a day (BID) | INTRAVENOUS | Status: DC

## 2017-08-27 SURGICAL SUPPLY — 11 items
CATH OMNI FLUSH 5F 65CM (CATHETERS) ×2
COVER PRB 48X5XTLSCP FOLD TPE (BAG) ×1
COVER PROBE 5X48 (BAG) ×1
KIT MICROINTRODUCER STIFF 5F (SHEATH) ×2
KIT PV (KITS) ×2
SHEATH PINNACLE 5F 10CM (SHEATH) ×2
SYR MEDRAD MARK V 150ML (SYRINGE) ×2
TRANSDUCER W/STOPCOCK (MISCELLANEOUS) ×2
TRAY PV CATH (CUSTOM PROCEDURE TRAY) ×2
WIRE BENTSON .035X145CM (WIRE) ×2
WIRE TORQFLEX AUST .018X40CM (WIRE) ×2

## 2017-08-27 NOTE — Interval H&P Note (Signed)
   History and Physical Update  The patient was interviewed and re-examined.  The patient's previous History and Physical has been reviewed and is unchanged from my consult.  There is no change in the plan of care: aortogram, bilateral leg runoff, and left leg intervention.   I discussed with the patient the nature of angiographic procedures, especially the limited patencies of any endovascular intervention.    The patient is aware of that the risks of an angiographic procedure include but are not limited to: bleeding, infection, access site complications, renal failure, embolization, rupture of vessel, dissection, arteriovenous fistula, possible need for emergent surgical intervention, possible need for surgical procedures to treat the patient's pathology, anaphylactic reaction to contrast, and stroke and death.    The patient is aware of the risks and agrees to proceed.   Adele Barthel, MD, FACS [facility] Office: [phone] Pager: [phone]  08/27/2017, 9:28 AM

## 2017-08-27 NOTE — Progress Notes (Signed)
5Fr right femoral arterial sheath aspirated and pulled.  Manual pressure held by Genelle Gather, for 63mins.  Hemostasis achieved, site level zero.  Tegaderm and gauze dressing applied.  Instructions given to pt, pt verbalizes understanding on instructions.  Right DP pulse dopplered before and after sheath pull.     Bedrest begins 1pm

## 2017-08-27 NOTE — Op Note (Signed)
OPERATIVE NOTE   PROCEDURE: 1.  right common femoral artery cannulation under ultrasound guidance 2.  Aortogram 3.  Bilateral runoff via catheter 4.  Conscious sedation (25 min)  PRE-OPERATIVE DIAGNOSIS: possible bilateral calciphylaxis in bilateral calves  POST-OPERATIVE DIAGNOSIS: same as above   SURGEON: Adele Barthel, MD  ANESTHESIA: conscious sedation  ESTIMATED BLOOD LOSS: 50 cc  CONTRAST: 106 cc  FINDING(S):  Heavily calcified throughout  Aorta: heavily calcified, patent  Superior mesenteric artery: heavily calcified, proximally patent, obscured by aorta Celiac artery: incompletely imaged, heavily calcified branches evident   Right Left  RA Not visualized Not visualized  CIA Patent, heavily calcified Patent, heavily calcified  EIA Patent, heavily calcified Patent, heavily calcified  IIA Patent, heavily calcified Patent, heavily calcified  CFA Patent, heavily calcified Patent, heavily calcified  SFA Patent, heavily calcified Patent, heavily calcified, distal serial stenoses 75-90%  PFA Patent, heavily calcified Patent, heavily calcified  Pop Patent, heavily calcified Patent, heavily calcified  Trif Patent, calcified throughout Patent, calcified throughout  AT Patent, diseased and calcified throughout, multiple high grade stenoses Patent, diseased and calcified throughout, multiple high grade stenoses  Pero Patent, diseased and calcified throughout, multiple high grade stenoses Patent, diseased and calcified throughout, multiple high grade stenoses, significant mid-calf occlusions  PT Patent, diseased and calcified throughout, multiple high grade stenoses Patent, diseased and calcified throughout, multiple high grade stenoses, significant mid-calf occlusions    SPECIMEN(S):  none  INDICATIONS:   Destiny Boyd is a 84 y.o. female who presents with bilateral calf wounds consistent with calciphylaxis.  The patient presents for: aortogram, bilateral leg runoff,  and possible intervention.  I discussed with the patient the nature of angiographic procedures, especially the limited patencies of any endovascular intervention.  The patient is aware of that the risks of an angiographic procedure include but are not limited to: bleeding, infection, access site complications, renal failure, embolization, rupture of vessel, dissection, possible need for emergent surgical intervention, possible need for surgical procedures to treat the patient's pathology, and stroke and death.  The patient is aware of the risks and agrees to proceed.  DESCRIPTION: After full informed consent was obtained from the patient, the patient was brought back to the angiography suite.  The patient was placed supine upon the angiography table and connected to monitoring equipment.  The patient was then given conscious sedation, the amounts of which are documented in the patient's chart.  The patient was prepped and drape in the standard fashion for an angiographic procedure.  At this point, attention was turned to the right groin.   Under ultrasound guidance, the subcutaneous tissue surrounding the right common femoral artery was anesthesized with 1% lidocaine with epinephrine.  The artery was then cannulated with a micropuncture needle.  The wire would not advance more than 2-3 cm into the artery.  The wire and needle were pulled and pressure held to the right groin for 2 minutes.  I cannulated the right common femoral artery under Sonosite guidance from a different angle.  The microwire was advanced into the iliac arterial system.  The needle was exchanged for a microsheath, which was loaded into the common femoral artery over the wire.  The microwire was exchanged for a Bentson wire which was advanced into the aorta.  The microsheath was then exchanged for a 5-Fr sheath which was loaded into the common femoral artery.   The Omniflush catheter was then loaded over the wire up to the level of L1.   The  catheter was connected to the power injector circuit.  After de-airring and de-clotting the circuit, a power injector aortogram was completed.  The findings are listed above.  At this point, I pulled the catheter down to the distal aorta as the angle and heavily calcification made separate leg runoff impossible.  An automated bilateral leg runoff was completed.  The findings are listed above.  Based on the images, there are no surgical intervention options.  I think it is reasonable to attempt a left leg intervention via antegrade cannulation.  I would schedule this for the hybrid OR so I can complete her skin biopsy on the calciphylactic wounds at the same time.   COMPLICATIONS: none  CONDITION: stable   Adele Barthel, MD, [facility] Office: [phone] Pager: [phone]  08/27/2017, 12:47 PM

## 2017-08-27 NOTE — Discharge Instructions (Signed)

## 2017-08-28 ENCOUNTER — Encounter (HOSPITAL_COMMUNITY): Payer: Self-pay | Admitting: Vascular Surgery

## 2017-08-28 ENCOUNTER — Other Ambulatory Visit: Payer: Self-pay

## 2017-08-28 NOTE — Progress Notes (Signed)
Pt SDW-pre-op call completed by pt daughter [name]. Daughter denies that pt C/O SOB and chest pain. Daughter denies that pt is under the care of a cardiologist. Daughter denies that pt had a stress test and cardiac cath. Daughter stated that pt last dose of Lovenox will be on Sunday morning. Daughter made aware to have pt stop taking otc vitamins, fish oil and herbal medications.  Do not take any NSAIDs ie: Ibuprofen, Advil, Naproxen (Aleve), Motrin, BC and Goody Powder. Daughter made aware that pt is to not take Tradjenta on DOS. Pt made aware to check BG every 2 hours prior to arrival to hospital on DOS. Pt made aware to treat a BG < 70 with  4 ounces of apple or cranberry juice, wait 15 minutes after intervention to recheck BG, if BG remains < 70, call Short Stay unit to speak with a nurse. Daughter verbalized understanding of all pre-op instructions. Anesthesia asked to review pt history.

## 2017-08-28 NOTE — Progress Notes (Signed)
Anesthesia Chart Review: SAME DAY WORK-UP.  Patient is an 84 year old female scheduled LE angiogram with left leg runoff, orbital atherectomy angioplasty left femoral-tibial artery, biopsy of skin subcutaneous tissue and/or mucous membrane left calf on 08/31/17 by Dr. Adele Barthel. She underwent aortogram with BLE runoff on 08/27/17. Dr. Bridgett Larsson wrote, "Based on the images, there are no surgical intervention options.  I think it is reasonable to attempt a left leg intervention via antegrade cannulation.  I would schedule this for the hybrid OR so I can complete her skin biopsy on the calciphylactic wounds at the same time." (Cardiology, Bernerd Pho, PA-C had previously cleared for aortogram and advised of Lovenox bridge.)  History includes former smoker (quit '86), iron deficiency anemia, ESRD (hemodialysis TTS, [facility]), DM2, PAF, miral valve disease, HTN, CVA '05, GERD, colon cancer s/p right hemicolectomy with resection of terminal ilium 06/2007, bladder cancer s/p fulguration of bladder tumor '10, hysterectomy.   - PCP is Dr. Rosita Fire. - Nephrologist is Dr. Fran Lowes. - Cardiologist is Dr. Kate Sable, first established on 01/26/17 when patient developed new onset afib. Last visit 07/07/17 with Bernerd Pho, PA-C. She was seen for f/u afib with RVR during 06/25/17 dialysis session (known prior history of PAF, diagnosed 01/2017). Cardizem had been increased. Anticoagulation maintained. She was in SR with frequent PACs at her last visit. Dr. Bronson Ing plans to monitor mitral valve disease clinically and with surveillance echocardiography.  Meds include ASA 81 mg, diltiazem, Lovenox bridge (last dose 08/30/17 AM), Rena-Vit, Protonix, Tradjenta, tramadol. Warfarin is currently on hold.  EKG 06/25/17: SR with rate variation. May be having bigeminy PACs.  Echo 01/24/17: Study Conclusions - Left ventricle: Wall thickness was increased in a pattern of mild   LVH. Systolic  function was normal. The estimated ejection   fraction was in the range of 55% to 60%. Left ventricular   diastolic function parameters were normal. - Mitral valve: Severely calcified, severely thickened annulus.   Moderately thickened, moderately calcified leaflets . There was   mild regurgitation. Peak gradient (D): 8 mm Hg - Left atrium: The atrium was mildly dilated. - Atrial septum: No defect or patent foramen ovale was identified. - Pulmonary arteries: PA peak pressure: 35 mm Hg (S).  CXR 01/24/17: IMPRESSION: Pulmonary vascular congestion without edema or consolidation. There is aortic atherosclerosis.  She is for labs on the day of surgery.   If labs acceptable and otherwise no acute changes then I would anticipate that she can proceed as planned.  George Hugh [facility] Short Stay Center/Anesthesiology Phone [phone] 08/28/2017 3:13 PM

## 2017-08-29 DIAGNOSIS — N2581 Secondary hyperparathyroidism of renal origin: Secondary | ICD-10-CM | POA: Diagnosis not present

## 2017-08-29 DIAGNOSIS — D631 Anemia in chronic kidney disease: Secondary | ICD-10-CM | POA: Diagnosis not present

## 2017-08-29 DIAGNOSIS — N186 End stage renal disease: Secondary | ICD-10-CM | POA: Diagnosis not present

## 2017-08-29 DIAGNOSIS — Z992 Dependence on renal dialysis: Secondary | ICD-10-CM | POA: Diagnosis not present

## 2017-08-29 DIAGNOSIS — D509 Iron deficiency anemia, unspecified: Secondary | ICD-10-CM | POA: Diagnosis not present

## 2017-08-30 NOTE — Anesthesia Preprocedure Evaluation (Addendum)
Anesthesia Evaluation  Patient identified by MRN, date of birth, ID band Patient awake    Reviewed: Allergy & Precautions, H&P , NPO status , Patient's Chart, lab work & pertinent test results  Airway Mallampati: II  TM Distance: >3 FB Neck ROM: Full    Dental  (+) Edentulous Upper, Edentulous Lower   Pulmonary neg pulmonary ROS, former smoker,    breath sounds clear to auscultation       Cardiovascular hypertension, Pt. on medications + Peripheral Vascular Disease  + dysrhythmias Atrial Fibrillation + Valvular Problems/Murmurs  Rhythm:Regular Rate:Normal + Systolic murmurs Aortogram 08/27/17 -  Heavily calcification throughout consistent Patent bilateral femoropopliteal segments Left distal SFA with multiple stenoses 75-90% Calcified, diffusely diseased tibial arteries in both legs not amendable to surgical bypass  TTE 01/2017 - Mild LVH. EF in range of 55% to 60%. Mild MR with mildly dilated LA.    Neuro/Psych CVA, No Residual Symptoms negative psych ROS   GI/Hepatic Neg liver ROS, GERD  Medicated,Cecal adenocarcinoma   Endo/Other  diabetes, Type 2, Oral Hypoglycemic Agents  Renal/GU Dialysis and ESRFRenal disease  negative genitourinary   Musculoskeletal negative musculoskeletal ROS (+)   Abdominal   Peds  Hematology negative hematology ROS (+)   Anesthesia Other Findings   Reproductive/Obstetrics                            Anesthesia Physical  Anesthesia Plan  ASA: III  Anesthesia Plan: General   Post-op Pain Management:    Induction: Intravenous  PONV Risk Score and Plan: Treatment may vary due to age or medical condition, Ondansetron, Propofol infusion and Dexamethasone  Airway Management Planned: Oral ETT  Additional Equipment: Arterial line  Intra-op Plan:   Post-operative Plan: Extubation in OR  Informed Consent: I have reviewed the patients History and Physical,  chart, labs and discussed the procedure including the risks, benefits and alternatives for the proposed anesthesia with the patient or authorized representative who has indicated his/her understanding and acceptance.   Dental advisory given  Plan Discussed with: CRNA  Anesthesia Plan Comments:         Anesthesia Quick Evaluation

## 2017-08-31 ENCOUNTER — Encounter (HOSPITAL_COMMUNITY)
Admission: RE | Disposition: A | Discharge status: Home Health | Payer: Self-pay | Source: Ambulatory Visit | Attending: Vascular Surgery

## 2017-08-31 ENCOUNTER — Inpatient Hospital Stay (HOSPITAL_COMMUNITY): Payer: Medicare Other | Admitting: Vascular Surgery

## 2017-08-31 ENCOUNTER — Other Ambulatory Visit: Payer: Self-pay

## 2017-08-31 ENCOUNTER — Inpatient Hospital Stay (HOSPITAL_COMMUNITY)
Admission: RE | Admit: 2017-08-31 | Discharge: 2017-09-03 | DRG: 629 | Disposition: A | Discharge status: Home Health | Payer: Medicare Other | Source: Ambulatory Visit | Attending: Vascular Surgery | Admitting: Vascular Surgery

## 2017-08-31 ENCOUNTER — Encounter (HOSPITAL_COMMUNITY): Payer: Self-pay

## 2017-08-31 DIAGNOSIS — L97219 Non-pressure chronic ulcer of right calf with unspecified severity: Secondary | ICD-10-CM | POA: Diagnosis present

## 2017-08-31 DIAGNOSIS — I480 Paroxysmal atrial fibrillation: Secondary | ICD-10-CM | POA: Diagnosis present

## 2017-08-31 DIAGNOSIS — E119 Type 2 diabetes mellitus without complications: Secondary | ICD-10-CM

## 2017-08-31 DIAGNOSIS — D631 Anemia in chronic kidney disease: Secondary | ICD-10-CM | POA: Diagnosis present

## 2017-08-31 DIAGNOSIS — Z9842 Cataract extraction status, left eye: Secondary | ICD-10-CM | POA: Diagnosis not present

## 2017-08-31 DIAGNOSIS — I10 Essential (primary) hypertension: Secondary | ICD-10-CM | POA: Diagnosis not present

## 2017-08-31 DIAGNOSIS — Z85038 Personal history of other malignant neoplasm of large intestine: Secondary | ICD-10-CM

## 2017-08-31 DIAGNOSIS — E1151 Type 2 diabetes mellitus with diabetic peripheral angiopathy without gangrene: Secondary | ICD-10-CM | POA: Diagnosis present

## 2017-08-31 DIAGNOSIS — I4891 Unspecified atrial fibrillation: Secondary | ICD-10-CM | POA: Diagnosis not present

## 2017-08-31 DIAGNOSIS — Z8673 Personal history of transient ischemic attack (TIA), and cerebral infarction without residual deficits: Secondary | ICD-10-CM

## 2017-08-31 DIAGNOSIS — N2581 Secondary hyperparathyroidism of renal origin: Secondary | ICD-10-CM | POA: Diagnosis not present

## 2017-08-31 DIAGNOSIS — N186 End stage renal disease: Secondary | ICD-10-CM | POA: Diagnosis present

## 2017-08-31 DIAGNOSIS — E11622 Type 2 diabetes mellitus with other skin ulcer: Secondary | ICD-10-CM | POA: Diagnosis present

## 2017-08-31 DIAGNOSIS — Z9841 Cataract extraction status, right eye: Secondary | ICD-10-CM

## 2017-08-31 DIAGNOSIS — E7800 Pure hypercholesterolemia, unspecified: Secondary | ICD-10-CM | POA: Diagnosis present

## 2017-08-31 DIAGNOSIS — Z87891 Personal history of nicotine dependence: Secondary | ICD-10-CM | POA: Diagnosis not present

## 2017-08-31 DIAGNOSIS — I120 Hypertensive chronic kidney disease with stage 5 chronic kidney disease or end stage renal disease: Secondary | ICD-10-CM | POA: Diagnosis present

## 2017-08-31 DIAGNOSIS — I999 Unspecified disorder of circulatory system: Secondary | ICD-10-CM | POA: Diagnosis not present

## 2017-08-31 DIAGNOSIS — Z992 Dependence on renal dialysis: Secondary | ICD-10-CM

## 2017-08-31 DIAGNOSIS — E1122 Type 2 diabetes mellitus with diabetic chronic kidney disease: Secondary | ICD-10-CM | POA: Diagnosis present

## 2017-08-31 DIAGNOSIS — Z961 Presence of intraocular lens: Secondary | ICD-10-CM | POA: Diagnosis present

## 2017-08-31 DIAGNOSIS — Z79899 Other long term (current) drug therapy: Secondary | ICD-10-CM

## 2017-08-31 DIAGNOSIS — I7025 Atherosclerosis of native arteries of other extremities with ulceration: Secondary | ICD-10-CM | POA: Diagnosis not present

## 2017-08-31 DIAGNOSIS — M109 Gout, unspecified: Secondary | ICD-10-CM | POA: Diagnosis present

## 2017-08-31 DIAGNOSIS — I739 Peripheral vascular disease, unspecified: Secondary | ICD-10-CM | POA: Diagnosis not present

## 2017-08-31 DIAGNOSIS — K219 Gastro-esophageal reflux disease without esophagitis: Secondary | ICD-10-CM | POA: Diagnosis present

## 2017-08-31 DIAGNOSIS — I96 Gangrene, not elsewhere classified: Secondary | ICD-10-CM | POA: Diagnosis not present

## 2017-08-31 DIAGNOSIS — I70202 Unspecified atherosclerosis of native arteries of extremities, left leg: Secondary | ICD-10-CM | POA: Diagnosis not present

## 2017-08-31 DIAGNOSIS — Z7901 Long term (current) use of anticoagulants: Secondary | ICD-10-CM | POA: Diagnosis not present

## 2017-08-31 DIAGNOSIS — E8889 Other specified metabolic disorders: Secondary | ICD-10-CM | POA: Diagnosis present

## 2017-08-31 DIAGNOSIS — L97229 Non-pressure chronic ulcer of left calf with unspecified severity: Secondary | ICD-10-CM | POA: Diagnosis present

## 2017-08-31 DIAGNOSIS — L97921 Non-pressure chronic ulcer of unspecified part of left lower leg limited to breakdown of skin: Secondary | ICD-10-CM | POA: Diagnosis not present

## 2017-08-31 HISTORY — PX: LOWER EXTREMITY ANGIOGRAM: SHX5508

## 2017-08-31 HISTORY — DX: Gastro-esophageal reflux disease without esophagitis: K21.9

## 2017-08-31 HISTORY — PX: LOWER EXTREMITY ANGIOGRAM: SHX5955

## 2017-08-31 HISTORY — DX: Presence of dental prosthetic device (complete) (partial): Z97.2

## 2017-08-31 HISTORY — DX: Presence of spectacles and contact lenses: Z97.3

## 2017-08-31 HISTORY — PX: ILIAC ATHERECTOMY: SHX6328

## 2017-08-31 HISTORY — PX: BIOPSY OF SKIN SUBCUTANEOUS TISSUE AND/OR MUCOUS MEMBRANE: SHX6741

## 2017-08-31 HISTORY — DX: Peripheral vascular disease, unspecified: I73.9

## 2017-08-31 HISTORY — DX: Complete loss of teeth, unspecified cause, unspecified class: K08.109

## 2017-08-31 LAB — CBC
HEMATOCRIT: 35.2 % — AB (ref 36.0–46.0)
HEMOGLOBIN: 11 g/dL — AB (ref 12.0–15.0)
MCH: 27.7 pg (ref 26.0–34.0)
MCHC: 31.3 g/dL (ref 30.0–36.0)
MCV: 88.7 fL (ref 78.0–100.0)
Platelets: 235 10*3/uL (ref 150–400)
RBC: 3.97 MIL/uL (ref 3.87–5.11)
RDW: 14.6 % (ref 11.5–15.5)
WBC: 4.8 10*3/uL (ref 4.0–10.5)

## 2017-08-31 LAB — COMPREHENSIVE METABOLIC PANEL
ALK PHOS: 71 U/L (ref 38–126)
ALT: 11 U/L — ABNORMAL LOW (ref 14–54)
AST: 14 U/L — AB (ref 15–41)
Albumin: 3.3 g/dL — ABNORMAL LOW (ref 3.5–5.0)
Anion gap: 16 — ABNORMAL HIGH (ref 5–15)
BILIRUBIN TOTAL: 0.9 mg/dL (ref 0.3–1.2)
BUN: 34 mg/dL — AB (ref 6–20)
CO2: 23 mmol/L (ref 22–32)
CREATININE: 7.78 mg/dL — AB (ref 0.44–1.00)
Calcium: 9.9 mg/dL (ref 8.9–10.3)
Chloride: 100 mmol/L — ABNORMAL LOW (ref 101–111)
GFR calc Af Amer: 5 mL/min — ABNORMAL LOW (ref >60)
GFR, EST NON AFRICAN AMERICAN: 4 mL/min — AB (ref >60)
Glucose, Bld: 136 mg/dL — ABNORMAL HIGH (ref 65–99)
Potassium: 3.8 mmol/L (ref 3.5–5.1)
Sodium: 139 mmol/L (ref 135–145)
TOTAL PROTEIN: 7.2 g/dL (ref 6.5–8.1)

## 2017-08-31 LAB — PROTIME-INR
INR: 1.01
PROTHROMBIN TIME: 13.2 s (ref 11.4–15.2)

## 2017-08-31 LAB — TYPE AND SCREEN
ABO/RH(D): B POS
Antibody Screen: NEGATIVE

## 2017-08-31 LAB — HEMOGLOBIN A1C
Hgb A1c MFr Bld: 6.6 % — ABNORMAL HIGH (ref 4.8–5.6)
MEAN PLASMA GLUCOSE: 142.72 mg/dL

## 2017-08-31 LAB — ABO/RH: ABO/RH(D): B POS

## 2017-08-31 LAB — GLUCOSE, CAPILLARY
Glucose-Capillary: 118 mg/dL — ABNORMAL HIGH (ref 65–99)
Glucose-Capillary: 129 mg/dL — ABNORMAL HIGH (ref 65–99)

## 2017-08-31 LAB — POCT ACTIVATED CLOTTING TIME: ACTIVATED CLOTTING TIME: 153 s

## 2017-08-31 SURGERY — ANGIOGRAM, LOWER EXTREMITY
Anesthesia: General | Site: Leg Upper | Laterality: Left

## 2017-08-31 MED ORDER — GLYCOPYRROLATE 0.2 MG/ML IV SOSY
PREFILLED_SYRINGE | INTRAVENOUS | Status: DC | PRN
  Administered 2017-08-31: .2 mg via INTRAVENOUS

## 2017-08-31 MED ORDER — ENOXAPARIN SODIUM 60 MG/0.6ML ~~LOC~~ SOLN
60.0000 mg | SUBCUTANEOUS | Status: DC
  Administered 2017-08-31 – 2017-09-03 (×3): 60 mg via SUBCUTANEOUS
  Filled 2017-08-31 (×5): qty 0.6

## 2017-08-31 MED ORDER — HYDRALAZINE HCL 20 MG/ML IJ SOLN
5.0000 mg | INTRAMUSCULAR | Status: AC | PRN
  Administered 2017-08-31 (×2): 5 mg via INTRAVENOUS
  Filled 2017-08-31: qty 1

## 2017-08-31 MED ORDER — HYDRALAZINE HCL 20 MG/ML IJ SOLN
INTRAMUSCULAR | Status: AC
  Administered 2017-08-31: 5 mg via INTRAVENOUS
  Filled 2017-08-31: qty 1

## 2017-08-31 MED ORDER — POLYETHYLENE GLYCOL 3350 17 G PO PACK: 17.0000 g | PACK | Freq: Every day | ORAL | Status: DC | PRN

## 2017-08-31 MED ORDER — HEPARIN SODIUM (PORCINE) 1000 UNIT/ML IJ SOLN
INTRAMUSCULAR | Status: DC | PRN
  Administered 2017-08-31: 2000 [IU] via INTRAVENOUS
  Administered 2017-08-31: 6000 [IU] via INTRAVENOUS

## 2017-08-31 MED ORDER — LABETALOL HCL 5 MG/ML IV SOLN
10.0000 mg | INTRAVENOUS | Status: DC | PRN
  Administered 2017-09-01: 10 mg via INTRAVENOUS
  Filled 2017-08-31: qty 4

## 2017-08-31 MED ORDER — NEOSTIGMINE METHYLSULFATE 5 MG/5ML IV SOSY
PREFILLED_SYRINGE | INTRAVENOUS | Status: DC | PRN
  Administered 2017-08-31: 1 mg via INTRAVENOUS

## 2017-08-31 MED ORDER — ONDANSETRON HCL 4 MG/2ML IJ SOLN
INTRAMUSCULAR | Status: DC | PRN
  Administered 2017-08-31: 4 mg via INTRAVENOUS

## 2017-08-31 MED ORDER — PROPOFOL 10 MG/ML IV BOLUS
INTRAVENOUS | Status: DC | PRN
  Administered 2017-08-31: 120 mg via INTRAVENOUS
  Administered 2017-08-31: 30 mg via INTRAVENOUS

## 2017-08-31 MED ORDER — SODIUM CHLORIDE 0.9 % IV SOLN
INTRAVENOUS | Status: DC
  Administered 2017-08-31: 07:00:00 via INTRAVENOUS

## 2017-08-31 MED ORDER — PHENYLEPHRINE HCL 10 MG/ML IJ SOLN
INTRAVENOUS | Status: DC | PRN
  Administered 2017-08-31: 25 ug/min via INTRAVENOUS

## 2017-08-31 MED ORDER — FENTANYL CITRATE (PF) 250 MCG/5ML IJ SOLN
INTRAMUSCULAR | Status: AC
  Filled 2017-08-31: qty 5

## 2017-08-31 MED ORDER — SODIUM CHLORIDE 0.9 % IV SOLN
100.0000 mL | INTRAVENOUS | Status: DC | PRN
Start: 2017-08-31 — End: 2017-09-03

## 2017-08-31 MED ORDER — ASPIRIN EC 81 MG PO TBEC
81.0000 mg | DELAYED_RELEASE_TABLET | Freq: Every day | ORAL | Status: DC
  Administered 2017-08-31 – 2017-09-03 (×4): 81 mg via ORAL
  Filled 2017-08-31 (×4): qty 1

## 2017-08-31 MED ORDER — POTASSIUM CHLORIDE CRYS ER 20 MEQ PO TBCR
20.0000 meq | EXTENDED_RELEASE_TABLET | Freq: Once | ORAL | Status: AC
  Administered 2017-08-31: 20 meq via ORAL
  Filled 2017-08-31: qty 1

## 2017-08-31 MED ORDER — DEXTROSE 5 % IV SOLN
INTRAVENOUS | Status: AC
  Filled 2017-08-31: qty 1.5

## 2017-08-31 MED ORDER — LIDOCAINE 2% (20 MG/ML) 5 ML SYRINGE
INTRAMUSCULAR | Status: AC
  Filled 2017-08-31: qty 5

## 2017-08-31 MED ORDER — ESMOLOL HCL 100 MG/10ML IV SOLN
INTRAVENOUS | Status: DC | PRN
  Administered 2017-08-31: 10 mg via INTRAVENOUS
  Administered 2017-08-31: 20 mg via INTRAVENOUS
  Administered 2017-08-31: 10 mg via INTRAVENOUS

## 2017-08-31 MED ORDER — LIDOCAINE 2% (20 MG/ML) 5 ML SYRINGE
INTRAMUSCULAR | Status: DC | PRN
  Administered 2017-08-31: 60 mg via INTRAVENOUS

## 2017-08-31 MED ORDER — ROCURONIUM BROMIDE 10 MG/ML (PF) SYRINGE
PREFILLED_SYRINGE | INTRAVENOUS | Status: DC | PRN
  Administered 2017-08-31: 20 mg via INTRAVENOUS

## 2017-08-31 MED ORDER — OXYCODONE HCL 5 MG PO TABS: 5.0000 mg | ORAL_TABLET | Freq: Once | ORAL | Status: DC | PRN

## 2017-08-31 MED ORDER — ONDANSETRON HCL 4 MG/2ML IJ SOLN
4.0000 mg | Freq: Four times a day (QID) | INTRAMUSCULAR | Status: DC | PRN
  Administered 2017-09-02: 4 mg via INTRAVENOUS
  Filled 2017-08-31: qty 2

## 2017-08-31 MED ORDER — PENTAFLUOROPROP-TETRAFLUOROETH EX AERO
1.0000 | INHALATION_SPRAY | CUTANEOUS | Status: DC | PRN
Start: 2017-08-31 — End: 2017-09-03

## 2017-08-31 MED ORDER — DILTIAZEM HCL ER COATED BEADS 120 MG PO CP24: 120.0000 mg | ORAL_CAPSULE | ORAL | Status: DC

## 2017-08-31 MED ORDER — HEPARIN SODIUM (PORCINE) 1000 UNIT/ML DIALYSIS: 1000.0000 [IU] | INTRAMUSCULAR | Status: DC | PRN

## 2017-08-31 MED ORDER — PHENOL 1.4 % MT LIQD: 1.0000 | OROMUCOSAL | Status: DC | PRN

## 2017-08-31 MED ORDER — DILTIAZEM HCL ER COATED BEADS 120 MG PO CP24
120.0000 mg | ORAL_CAPSULE | Freq: Every day | ORAL | Status: DC
  Administered 2017-08-31 – 2017-09-02 (×3): 120 mg via ORAL
  Filled 2017-08-31 (×3): qty 1

## 2017-08-31 MED ORDER — MORPHINE SULFATE (PF) 4 MG/ML IV SOLN
4.0000 mg | INTRAVENOUS | Status: DC | PRN
  Administered 2017-08-31 – 2017-09-01 (×4): 4 mg via INTRAVENOUS
  Filled 2017-08-31 (×3): qty 1

## 2017-08-31 MED ORDER — HEPARIN SODIUM (PORCINE) 1000 UNIT/ML IJ SOLN
INTRAMUSCULAR | Status: AC
  Filled 2017-08-31: qty 1

## 2017-08-31 MED ORDER — OXYCODONE HCL 5 MG/5ML PO SOLN: 5.0000 mg | Freq: Once | ORAL | Status: DC | PRN

## 2017-08-31 MED ORDER — SODIUM CHLORIDE 0.9 % IV SOLN
INTRAVENOUS | Status: DC | PRN
  Administered 2017-08-31: 500 mL

## 2017-08-31 MED ORDER — CHLORHEXIDINE GLUCONATE 4 % EX LIQD: 60.0000 mL | Freq: Once | CUTANEOUS | Status: DC

## 2017-08-31 MED ORDER — METOPROLOL TARTRATE 5 MG/5ML IV SOLN: 2.0000 mg | INTRAVENOUS | Status: DC | PRN

## 2017-08-31 MED ORDER — LIDOCAINE HCL (PF) 1 % IJ SOLN: 5.0000 mL | INTRAMUSCULAR | Status: DC | PRN

## 2017-08-31 MED ORDER — DILTIAZEM HCL ER COATED BEADS 240 MG PO CP24
240.0000 mg | ORAL_CAPSULE | Freq: Every day | ORAL | Status: DC
  Administered 2017-09-01 – 2017-09-02 (×2): 240 mg via ORAL
  Filled 2017-08-31 (×3): qty 1

## 2017-08-31 MED ORDER — ROCURONIUM BROMIDE 10 MG/ML (PF) SYRINGE
PREFILLED_SYRINGE | INTRAVENOUS | Status: AC
  Filled 2017-08-31: qty 5

## 2017-08-31 MED ORDER — RENA-VITE PO TABS
1.0000 | ORAL_TABLET | Freq: Every day | ORAL | Status: DC
  Administered 2017-08-31 – 2017-09-02 (×3): 1 via ORAL
  Filled 2017-08-31 (×3): qty 1

## 2017-08-31 MED ORDER — OXYCODONE-ACETAMINOPHEN 5-325 MG PO TABS
1.0000 | ORAL_TABLET | ORAL | Status: DC | PRN
  Administered 2017-08-31 – 2017-09-02 (×5): 2 via ORAL
  Administered 2017-09-03: 1 via ORAL
  Filled 2017-08-31: qty 2
  Filled 2017-08-31: qty 1
  Filled 2017-08-31 (×5): qty 2

## 2017-08-31 MED ORDER — DEXTROSE 5 % IV SOLN
1.5000 g | INTRAVENOUS | Status: AC
  Administered 2017-08-31: 1.5 g via INTRAVENOUS

## 2017-08-31 MED ORDER — ONDANSETRON HCL 4 MG/2ML IJ SOLN
INTRAMUSCULAR | Status: AC
  Filled 2017-08-31: qty 2

## 2017-08-31 MED ORDER — NITROGLYCERIN FOR UTERINE RELAXATION OPTIME 200MCG/ML
INTRAVENOUS | Status: AC | PRN
  Administered 2017-08-31: 100 ug via INTRAVENOUS

## 2017-08-31 MED ORDER — TRAMADOL HCL 50 MG PO TABS
100.0000 mg | ORAL_TABLET | Freq: Every day | ORAL | Status: DC
  Administered 2017-08-31 – 2017-09-02 (×3): 100 mg via ORAL
  Filled 2017-08-31 (×3): qty 2

## 2017-08-31 MED ORDER — LABETALOL HCL 5 MG/ML IV SOLN
INTRAVENOUS | Status: AC
  Administered 2017-08-31: 5 mg via INTRAVENOUS
  Filled 2017-08-31: qty 4

## 2017-08-31 MED ORDER — IODIXANOL 320 MG/ML IV SOLN
INTRAVENOUS | Status: DC | PRN
  Administered 2017-08-31: 50 mL via INTRA_ARTERIAL
  Administered 2017-08-31: 25 mL via INTRA_ARTERIAL

## 2017-08-31 MED ORDER — LINAGLIPTIN 5 MG PO TABS
5.0000 mg | ORAL_TABLET | Freq: Every day | ORAL | Status: DC
  Administered 2017-08-31 – 2017-09-03 (×4): 5 mg via ORAL
  Filled 2017-08-31 (×4): qty 1

## 2017-08-31 MED ORDER — PANTOPRAZOLE SODIUM 40 MG PO TBEC
40.0000 mg | DELAYED_RELEASE_TABLET | Freq: Every day | ORAL | Status: DC
  Administered 2017-08-31 – 2017-09-03 (×4): 40 mg via ORAL
  Filled 2017-08-31 (×4): qty 1

## 2017-08-31 MED ORDER — LIDOCAINE-PRILOCAINE 2.5-2.5 % EX CREA
1.0000 | TOPICAL_CREAM | CUTANEOUS | Status: DC | PRN
Start: 2017-08-31 — End: 2017-09-03

## 2017-08-31 MED ORDER — PROTAMINE SULFATE 10 MG/ML IV SOLN
INTRAVENOUS | Status: AC
  Filled 2017-08-31: qty 5

## 2017-08-31 MED ORDER — ESMOLOL HCL 100 MG/10ML IV SOLN
INTRAVENOUS | Status: AC
  Filled 2017-08-31: qty 10

## 2017-08-31 MED ORDER — ONDANSETRON HCL 4 MG/2ML IJ SOLN: 4.0000 mg | Freq: Once | INTRAMUSCULAR | Status: DC | PRN

## 2017-08-31 MED ORDER — PROPOFOL 10 MG/ML IV BOLUS
INTRAVENOUS | Status: AC
  Filled 2017-08-31: qty 20

## 2017-08-31 MED ORDER — GUAIFENESIN-DM 100-10 MG/5ML PO SYRP: 15.0000 mL | ORAL_SOLUTION | ORAL | Status: DC | PRN

## 2017-08-31 MED ORDER — FENTANYL CITRATE (PF) 100 MCG/2ML IJ SOLN
25.0000 ug | INTRAMUSCULAR | Status: DC | PRN
  Administered 2017-08-31: 25 ug via INTRAVENOUS

## 2017-08-31 MED ORDER — LABETALOL HCL 5 MG/ML IV SOLN
5.0000 mg | Freq: Once | INTRAVENOUS | Status: AC
  Administered 2017-08-31: 5 mg via INTRAVENOUS
  Filled 2017-08-31: qty 4

## 2017-08-31 MED ORDER — ALTEPLASE 2 MG IJ SOLR: 2.0000 mg | Freq: Once | INTRAMUSCULAR | Status: DC | PRN

## 2017-08-31 MED ORDER — 0.9 % SODIUM CHLORIDE (POUR BTL) OPTIME
TOPICAL | Status: DC | PRN
  Administered 2017-08-31: 1000 mL

## 2017-08-31 MED ORDER — FENTANYL CITRATE (PF) 100 MCG/2ML IJ SOLN
INTRAMUSCULAR | Status: DC | PRN
  Administered 2017-08-31 (×5): 25 ug via INTRAVENOUS

## 2017-08-31 MED ORDER — NEOSTIGMINE METHYLSULFATE 5 MG/5ML IV SOSY
PREFILLED_SYRINGE | INTRAVENOUS | Status: AC
  Filled 2017-08-31: qty 5

## 2017-08-31 MED ORDER — VIPERSLIDE LUBRICANT OPTIME
TOPICAL | Status: DC | PRN
  Administered 2017-08-31: 1000 mL via SURGICAL_CAVITY

## 2017-08-31 MED ORDER — SEVELAMER CARBONATE 800 MG PO TABS
3200.0000 mg | ORAL_TABLET | Freq: Three times a day (TID) | ORAL | Status: DC
  Administered 2017-08-31 – 2017-09-03 (×8): 3200 mg via ORAL
  Filled 2017-08-31 (×8): qty 4

## 2017-08-31 MED ORDER — FENTANYL CITRATE (PF) 100 MCG/2ML IJ SOLN
INTRAMUSCULAR | Status: AC
  Filled 2017-08-31: qty 2

## 2017-08-31 MED ORDER — PROTAMINE SULFATE 10 MG/ML IV SOLN
INTRAVENOUS | Status: DC | PRN
  Administered 2017-08-31: 40 mg via INTRAVENOUS

## 2017-08-31 SURGICAL SUPPLY — 95 items
BAG BANDED W/RUBBER/TAPE 36X54 (MISCELLANEOUS) ×10 IMPLANT
BAG ISOLATION DRAPE 18X18 (DRAPES) ×3 IMPLANT
BAG SNAP BAND KOVER 36X36 (MISCELLANEOUS) IMPLANT
BALLN LUTONIX DCB 5X100X130 (BALLOONS) ×5
BALLN STERLING OTW 3X150X150 (BALLOONS) ×5
BALLOON LUTONIX DCB 5X100X130 (BALLOONS) ×3 IMPLANT
BALLOON STERLING OTW 3X150X150 (BALLOONS) ×3 IMPLANT
BANDAGE ACE 4X5 VEL STRL LF (GAUZE/BANDAGES/DRESSINGS) IMPLANT
CANISTER SUCT 3000ML PPV (MISCELLANEOUS) ×5 IMPLANT
CATH ANGIO 5F BER2 65CM (CATHETERS) IMPLANT
CATH CXI SUPP ANG 2.6FR 150CM (MICROCATHETER) ×5 IMPLANT
CATH SOFT-VU 4F 65 STRAIGHT (CATHETERS) ×3 IMPLANT
CATH SOFT-VU STRAIGHT 4F 65CM (CATHETERS) ×2
CHLORAPREP W/TINT 26ML (MISCELLANEOUS) ×5 IMPLANT
CONT SPECI 4OZ STER CLIK (MISCELLANEOUS) ×10 IMPLANT
COVER BACK TABLE 60X90IN (DRAPES) ×5 IMPLANT
COVER BACK TABLE 80X110 HD (DRAPES) ×5 IMPLANT
COVER DOME SNAP 22 D (MISCELLANEOUS) ×10 IMPLANT
COVER PROBE W GEL 5X96 (DRAPES) ×5 IMPLANT
COVER TRANSDUCER ULTRASND GEL (DRAPE) ×5 IMPLANT
CROWN STEALTH MICRO-30 1.25MM (CATHETERS) ×5 IMPLANT
DERMABOND ADHESIVE PROPEN (GAUZE/BANDAGES/DRESSINGS) ×2
DERMABOND ADVANCED .7 DNX6 (GAUZE/BANDAGES/DRESSINGS) ×3 IMPLANT
DEVICE EMBOSHIELD NAV6 4.0-7.0 (WIRE) ×5 IMPLANT
DEVICE TORQUE KENDALL .025-038 (MISCELLANEOUS) ×5 IMPLANT
DIAMONDBACK SOLID OAS 2.0MM (CATHETERS) ×5
DRAPE FEMORAL ANGIO 80X135IN (DRAPES) ×5 IMPLANT
DRAPE ISOLATION BAG 18X18 (DRAPES) ×2
DRAPE UNIVERSAL (DRAPES) ×5 IMPLANT
DRSG OPSITE POSTOP 3X4 (GAUZE/BANDAGES/DRESSINGS) ×5 IMPLANT
ELECT REM PT RETURN 9FT ADLT (ELECTROSURGICAL) ×5
ELECTRODE REM PT RTRN 9FT ADLT (ELECTROSURGICAL) ×3 IMPLANT
GAUZE SPONGE 2X2 8PLY STRL LF (GAUZE/BANDAGES/DRESSINGS) ×6 IMPLANT
GAUZE SPONGE 4X4 16PLY XRAY LF (GAUZE/BANDAGES/DRESSINGS) ×5 IMPLANT
GLOVE BIO SURGEON STRL SZ 6 (GLOVE) ×10 IMPLANT
GLOVE BIO SURGEON STRL SZ7 (GLOVE) ×5 IMPLANT
GLOVE BIOGEL PI IND STRL 6.5 (GLOVE) ×6 IMPLANT
GLOVE BIOGEL PI IND STRL 7.5 (GLOVE) ×3 IMPLANT
GLOVE BIOGEL PI INDICATOR 6.5 (GLOVE) ×4
GLOVE BIOGEL PI INDICATOR 7.5 (GLOVE) ×2
GOWN STRL REUS W/ TWL LRG LVL3 (GOWN DISPOSABLE) ×9 IMPLANT
GOWN STRL REUS W/TWL LRG LVL3 (GOWN DISPOSABLE) ×6
GUIDEWIRE ANGLED .035X150CM (WIRE) ×5 IMPLANT
HEMOSTAT SPONGE AVITENE ULTRA (HEMOSTASIS) ×5 IMPLANT
KIT BASIN OR (CUSTOM PROCEDURE TRAY) ×5 IMPLANT
KIT ENCORE 26 ADVANTAGE (KITS) ×5 IMPLANT
KIT ROOM TURNOVER OR (KITS) ×5 IMPLANT
LUBRICANT VIPERSLIDE CORONARY (MISCELLANEOUS) ×5 IMPLANT
NEEDLE 18GX1X1/2 (RX/OR ONLY) (NEEDLE) ×5 IMPLANT
NEEDLE PERC 18GX7CM (NEEDLE) ×5 IMPLANT
NS IRRIG 1000ML POUR BTL (IV SOLUTION) ×10 IMPLANT
PACK PERIPHERAL VASCULAR (CUSTOM PROCEDURE TRAY) IMPLANT
PACK SURGICAL SETUP 50X90 (CUSTOM PROCEDURE TRAY) ×5 IMPLANT
PAD ARMBOARD 7.5X6 YLW CONV (MISCELLANEOUS) ×10 IMPLANT
PROTECTION STATION PRESSURIZED (MISCELLANEOUS) ×5
PUNCH BIOPSY DERMAL 6MM STRL (MISCELLANEOUS) ×5 IMPLANT
SET MICROPUNCTURE 5F STIFF (MISCELLANEOUS) ×10 IMPLANT
SHEATH AVANTI 11CM 5FR (MISCELLANEOUS) ×10 IMPLANT
SHEATH BRITE TIP 6FR 35CM (SHEATH) ×5 IMPLANT
SHEATH PINNACLE 6F 10CM (SHEATH) IMPLANT
SPONGE GAUZE 2X2 STER 10/PKG (GAUZE/BANDAGES/DRESSINGS) ×4
STATION PROTECTION PRESSURIZED (MISCELLANEOUS) ×3 IMPLANT
STOPCOCK MORSE 400PSI 3WAY (MISCELLANEOUS) IMPLANT
SUT ETHILON 3 0 PS 1 (SUTURE) ×15 IMPLANT
SUT ETHILON 4 0 PS 2 18 (SUTURE) ×5 IMPLANT
SUT MNCRL AB 4-0 PS2 18 (SUTURE) ×5 IMPLANT
SUT PROLENE 5 0 C 1 24 (SUTURE) IMPLANT
SUT PROLENE 6 0 BV (SUTURE) IMPLANT
SUT VIC AB 2-0 CT1 27 (SUTURE)
SUT VIC AB 2-0 CT1 TAPERPNT 27 (SUTURE) IMPLANT
SUT VIC AB 3-0 SH 27 (SUTURE)
SUT VIC AB 3-0 SH 27X BRD (SUTURE) IMPLANT
SUT VIC AB 4-0 PS2 27 (SUTURE) ×5 IMPLANT
SYR 10ML LL (SYRINGE) ×15 IMPLANT
SYR 20CC LL (SYRINGE) ×4 IMPLANT
SYR 30ML LL (SYRINGE) ×5 IMPLANT
SYR 3ML LL SCALE MARK (SYRINGE) ×15 IMPLANT
SYR MEDRAD MARK V 150ML (SYRINGE) IMPLANT
SYRINGE 20CC LL (MISCELLANEOUS) ×5 IMPLANT
SYSTEM DIMNDBCK SLD OAS 2.0MM (CATHETERS) ×3 IMPLANT
TAPE CLOTH SURG 4X10 WHT LF (GAUZE/BANDAGES/DRESSINGS) ×5 IMPLANT
TAPE VIPERTRACK RADIOPAQ 30X (MISCELLANEOUS) ×3 IMPLANT
TAPE VIPERTRACK RADIOPAQUE (MISCELLANEOUS) ×2
TOWEL GREEN STERILE (TOWEL DISPOSABLE) ×10 IMPLANT
TOWEL GREEN STERILE FF (TOWEL DISPOSABLE) ×5 IMPLANT
TRAY FOLEY W/METER SILVER 16FR (SET/KITS/TRAYS/PACK) IMPLANT
TUBING HIGH PRESSURE 120CM (CONNECTOR) IMPLANT
UNDERPAD 30X30 (UNDERPADS AND DIAPERS) ×5 IMPLANT
WATER STERILE IRR 1000ML POUR (IV SOLUTION) ×5 IMPLANT
WIRE BENTSON .035X145CM (WIRE) ×10 IMPLANT
WIRE G V18X300CM (WIRE) ×5 IMPLANT
WIRE MICRO SET SILHO 5FR 7 (SHEATH) IMPLANT
WIRE TORQFLEX AUST .018X40CM (WIRE) ×10 IMPLANT
WIRE VIPER ADVANCE .017X335CM (WIRE) ×5 IMPLANT
WIRE VIPER WIRECTO 0.014 (WIRE) ×5 IMPLANT

## 2017-08-31 NOTE — Op Note (Signed)
OPERATIVE NOTE   PROCEDURE: 1.  Left common femoral artery antegrade cannulation under ultrasound guidance 2.  Left leg runoff 3.  Placement of embolic protection device (large Navshield) 4.  Orbital atherectomy left superficial femoral artery (Diamondback Stealth 2.0 Solid) 5.  Drug coated angioplasty of left superficial femoral artery (Lutonix 5 mm x 100 mm) 6.  Orbital atherectomy left peroneal artery (Diamondback Stealth 1.25 Micro) 7.  Angioplasty left peroneal artery (3 mm x 220 mm) 8.  Intra-arterial administration of nitroglycerin (1600 mcg) 9.  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  Serial stenoses in distal superficial femoral artery >75%: resolved with orbital atherectomy and angioplasty 2.  Embolism to filter after orbital atherectomy and angioplasty: resolved with filter retrieval 3.  Serial occlusions in left peroneal artery: resolved with recalcitrant spasm in mid-segment after repeat angioplasty, hand injection demonstrated patency of peroneal distal to spasm 4.  Bleeding from biopsy sites  SPECIMEN(S):  Left lateral calf skin biopsy x 2   INDICATIONS:   Destiny Boyd is a 84 y.o. female who presents with bilateral calf ulcers concerning for calciphylaxis.  The patient previously had aortogram and bilateral leg runoff which demonstrated widescale calcification in multiple arterial beds with no in-line tibial arteries in left leg.  I recommended: antegrade left femoral artery cannulation, left leg runoff, and intervention on left leg.  I discussed with the patient the nature of angiographic procedures, especially the limited patencies of any endovascular intervention.  The patient is aware of that the risks of an angiographic  procedure include but are not limited to: bleeding, infection, access site complications, renal failure, embolization, rupture of vessel, dissection, arteriovenous fistula, possible need for emergent surgical intervention, possible need for surgical procedures to treat the patient's pathology, anaphylactic reaction to contrast, and stroke and death.  The patient is aware of the risks and agrees to proceed.   DESCRIPTION: After full informed consent was obtained from the patient, the patient was brought back to the hybrid operating room.  The patient was placed supine upon the angiography table and connected to cardiopulmonary monitoring equipment.  After obtained anesthesia, the patient was prepped and drape in the standard fashion for an angiographic procedure and left calf biopsy.  At this point, attention was turned to the left groin.  Under Sonosite guidance, I antegrade cannulated the left common femoral artery with some difficulty due to significant calcific disease.  The wire advanced into the profunda femoral artery.  I placed the microsheath over the wire and pulled back while injection, identifying the common femoral artery.  I tried to advance the wire but this prolapse the sheath into the soft tissue.  I removed the wire and sheath and held pressure for 3 minutes.  I then repeated this process, this time cannulating the distal common femoral artery and advancing the wire into the superficial femoral artery.  The needle was exchanged for the microsheath.  I exchanged the wire for a Bentson wire, which easily advanced into the proximal superficial femoral artery.  I exchanged the sheath for a long 6-Fr sheath, which I advanced over the wire into the mid-segment of the superficial femoral artery.  The patient was given 6000 units of Heparin intravenously, which was a therapeutic bolus. An additional 2000 units  of Heparin was administered every hour after initial bolus to maintain anticoagulation.  In  total, 8000 units of Heparin was administrated to achieve and maintain a therapeutic level of anticoagulation.   I did a hand injection to identify the disease distal superficial femoral artery.  Using a straight catheter and Bentson wire, I got down into the below-the-knee popliteal artery.  The wire was exchanged for a Viperwire which was placed in the below-the-knee popliteal artery.  I prepped a large Navshield and loaded it over the wire down in the below-the-knee popliteal artery segment.  I deployed the embolic protection device at this location.  I removed the delivery sheath and then loaded a Diamondback Stealth 2.0 Solid crown orbital atherectomy device over the wire.  I atherectomized 10 cm segment of the distal superficial femoral artery at 60K, 90K, and 140KRPM x 2.  In between every two interventions, I gave 200 mcg of nitroglycerin.  I then removed the device and then centered a 5 mm x 100 mm Lutonix drug coated balloon on the stenotic segment.  I inflated the balloon at 12 ATM for 3 minutes and deflated to 6 ATM for 2 minutes.  I then deflated the balloon and removed the balloon.  Completion angiogram demonstrated resolution of the stenoses at this segment.  There was evidence of debris in the embolic protection device.  I placed the filter capture device over the wire and recaptured the filter.  This filter would not fully collapse, so I had to removed the wire and filter device together.  There was a significant amount of atheromatous material in the filter.   At this point, using an angle CXI catheter and a V-18 wire, I was able to steering down to the trifurcation.  I did a hand injection to image the tibial arteries.  The dominant artery appeared to be the peroneal artery.  Using this combination of CXI catheter and V-18, I was able to steer into the peroneal artery and get down to the angle.  The wire would not cross the ankle joint.  I advanced the catheter down to this segment.  I  exchanged the wire for a new Viperwire.  I removed the catheter did a hand injection via the sheath to verify I was in the peroneal artery.  I obtained a Firefighter 1.25 Micro crown orbital atherectomy device and completed atherectomy of the entire artery at 60K, 90K, and 140KRPM x 2.  In between every two interventions, I gave 200 mcg of nitroglycerin.  Due to the length of the peroneal artery, this required two rounds of atherectomy to treat the entire artery.  In total, 1200 mcg of nitroglycerin was given during orbital atherectomy.  I removed the atherectomy device and then obtained a 3 mm x 220 mm angioplasty device.  This was inflated at 12 ATM x 3 minutes and then deflated the balloon to 6 ATM for 2 minutes.  I then advanced the balloon into the distal peroneal artery.  The balloon would not cross the ankle.  The balloon was inflated again to 12 ATM x 3 and then deflated the balloon to 6 ATM for 2 minutes.  I removed the balloon and completion angiogram demonstrated spasm at the mid-segment of the peroneal artery.  I gave an additional 200 mcg of nitroglycerin via the left femoral sheath.  I then replaced the 3 mm x 220 mm balloon and centered it on the mid-segment of the peroneal artery.  I inflated the balloon to  6 ATM for 3 minutes.  The balloon was deflated.  The spasm appeared to be better but no fully resolved.  I exchanged the balloon for the CXI catheter and then injected another 200 mcg of nitroglycerin.  I did a hand injection which demonstrate improved resolution of the spasm.  The peroneal artery looked markedly improved where treated.  The distal peroneal artery appeared to be in some residual spasm.  I expect this peroneal artery spasm to resolve with time.  I removed the wire and catheter.  I gave the patient 40 mg of Protamine to reverse anticoagulation.  After waiting 3 minutes, a ACT was completed which was only in 150 sec.  The left femoral sheath was removed and pressure held  for 20 minutes.  There was minimal hematoma after the sheath pull.  I then turned my attention to the left calf.  It had already been sterile prepped and draped.  I made an aperature with scissor in the covering drapes to expose the two areas of black eschar in the left lateral calf.  I used a 6 mm punch to take a skin biopsy at the edge of the most proximal eschar, getting half eschar and half viable tissue.  There was some venous bleeding at this site.  I held pressure for a couple of minutes to control the bleeding.  I repaired this site with 3 interrupted 3-0 Nylon stitches.    I then moved onto the more distal eschar.  I then took a 6 mm punch to take a skin biopsy here in a similar fashion.  There was more bleeding at this location.  The underlying soft tissue appeared to viable.  I held pressure for 3 minutes to slow down bleeding.  I repaired this biopsy site with two horizontal mattress stitches with 3-0 Nylon.  I had to place another running stitch to further close the superficial portion of this punch site.  The calf was washed off, cleaned, and dressed with sterile bandages.  At the end of this case, there was a palpable femoral pulse no further bleeding from the left calf.   COMPLICATIONS: none  CONDITION: stable   Adele Barthel, MD, [employer] [facility] Office: [phone] Pager: [phone]  08/31/2017, 10:54 AM

## 2017-08-31 NOTE — Transfer of Care (Signed)
Immediate Anesthesia Transfer of Care Note  Patient: Destiny Boyd  Procedure(s) Performed: LOWER EXTREMITY ANGIOGRAM LEFT LEG RUNOFF (Left ) ORBITAL ATHERECTOMY of Left Superficial Femoral  and Peroneal  Artery. Drug Coated ANGIOPLASTY of Left Superficial Femoral Artery. Angioplasty of Peroneal Artery (Left Leg Upper) BIOPSY OF SKIN SUBCUTANEOUS TISSUE  LEFT CALF (Left Leg Lower)  Patient Location: PACU  Anesthesia Type:General  Level of Consciousness: awake  Airway & Oxygen Therapy: Patient Spontanous Breathing and Patient connected to nasal cannula oxygen  Post-op Assessment: Report given to RN  Post vital signs: Reviewed and stable  Last Vitals:  Vitals:   08/31/17 0641 08/31/17 0644  BP:  (!) 188/94  Pulse:  (!) 105  Resp:  16  Temp: 37.2 C   SpO2:  98%    Last Pain:  Vitals:   08/31/17 0644  TempSrc:   PainSc: 0-No pain      Patients Stated Pain Goal: 3 (34/19/62 2297)  Complications: No apparent anesthesia complications

## 2017-08-31 NOTE — Progress Notes (Signed)
ANTICOAGULATION CONSULT NOTE - Initial Consult  Pharmacy Consult for Lovenox Indication: atrial fibrillation  No Known Allergies  Patient Measurements: Height: 5' 4.5" (163.8 cm) Weight: 122 lb (55.3 kg) IBW/kg (Calculated) : 55.85  Vital Signs: Temp: 97.5 F (36.4 C) (01/14 1220) Temp Source: Oral (01/14 1220) BP: 163/89 (01/14 1311) Pulse Rate: 85 (01/14 1311)  Labs: Recent Labs    08/31/17 0630  HGB 11.0*  HCT 35.2*  PLT 235  LABPROT 13.2  INR 1.01  CREATININE 7.78*    Estimated Creatinine Clearance: 5 mL/min (A) (by C-G formula based on SCr of 7.78 mg/dL (H)).   Medical History: Past Medical History:  Diagnosis Date  . 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 Colon cancer ([place])    cecum cancer  . Diabetes mellitus   . ESRD (end stage renal disease) ([place])   . Full dentures   . GERD (gastroesophageal reflux disease)   . Gout   . Hypercholesteremia   . Hypertension   . Iron deficiency anemia   . Leukopenia   . PAF (paroxysmal atrial fibrillation) ([facility])    a. on Coumadin for anticoagulation  . Stroke ([place]) 2005  . Wears glasses    Assessment: 83yof on coumadin for afib. Coumadin was being held and she was bridged outpatient with lovenox anticipating multiple vascular procedures. She is s/p LLE angiogram/calf biopsy today and lovenox to resume at 1800 tonight. INR 1.01.  Goal of Therapy:  Anti-Xa level 0.6-1 units/ml 4hrs after LMWH dose given Monitor platelets by anticoagulation protocol: Yes   Plan:  1) At 1800, begin lovenox 60mg  sq q24 2) Follow CBC at least every 3 days  Deboraha Sprang 08/31/2017,1:33 PM

## 2017-08-31 NOTE — Anesthesia Procedure Notes (Signed)
Procedure Name: Intubation Date/Time: 08/31/2017 7:49 AM Performed by: Barrington Ellison, CRNA Pre-anesthesia Checklist: Patient identified, Emergency Drugs available, Suction available and Patient being monitored Patient Re-evaluated:Patient Re-evaluated prior to induction Oxygen Delivery Method: Circle System Utilized Preoxygenation: Pre-oxygenation with 100% oxygen Induction Type: IV induction Ventilation: Mask ventilation without difficulty Laryngoscope Size: Mac and 3 Grade View: Grade I Tube type: Oral Tube size: 7.0 mm Number of attempts: 1 Airway Equipment and Method: Stylet and Oral airway Placement Confirmation: ETT inserted through vocal cords under direct vision,  positive ETCO2 and breath sounds checked- equal and bilateral Secured at: 20 cm Tube secured with: Tape Dental Injury: Teeth and Oropharynx as per pre-operative assessment

## 2017-08-31 NOTE — Interval H&P Note (Signed)
   History and Physical Update  The patient was interviewed and re-examined.  The patient's previous History and Physical has been reviewed and is unchanged from my consult except for: interval angiogram.  Patient has severe tibial artery disease bilaterally with extensive severe calcification.  Patient also has severe serial stenoses in left distal SFA.  Due to acute aortic bifurcation and severe calcification, it was impossible to cross the bifurcation.  The plan today is: antegrade cannulation of left femoral artery, left leg runoff, orbital atherectomy and angioplasty of left SFA, and possible intervention of L tibial arteries, and skin biopsy.   I discussed with the patient the nature of angiographic procedures, especially the limited patencies of any endovascular intervention.    The patient is aware of that the risks of an angiographic procedure include but are not limited to: bleeding, infection, access site complications, renal failure, embolization, rupture of vessel, dissection, arteriovenous fistula, possible need for emergent surgical intervention, possible need for surgical procedures to treat the patient's pathology, anaphylactic reaction to contrast, and stroke and death.    The patient is aware of the risks and agrees to proceed.    Adele Barthel, MD, FACS [facility] Office: [phone] Pager: [phone]  08/31/2017, 7:11 AM

## 2017-08-31 NOTE — Consult Note (Addendum)
Reason for Consult: To manage dialysis and dialysis related needs Referring Physician: Dr. Karrie Doffing Destiny Boyd is an 84 y.o. female.  HPI: Pt is an 59F with ESRD on HD, possible calciphylaxis, HTN, Afib, and LLE critical limb ischemia who underwent aortogram with atherectomy of L SFA and L peroneal artery today with Dr. Bridgett Larsson.  We are now asked to see for provision of HD and management of ESRD.    Currently pt is sleeping and has just received 2 pain pills.  History provided by dtr.  She reports pt has been on HD for about 2 years and has done well.  No concerns with dialysis.  Only concern today is pain which is currently being managed.  Dialysis orders: [facility] TTS 3 hrs 3K/ 2.5 Ca EDW 53.5 kg No heparin LUE AVF BFR 400 Epogen 2400 u TIW Venofer 50 mg q week  Past Medical History:  Diagnosis Date  . 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 Colon cancer ([place])    cecum cancer  . Diabetes mellitus   . ESRD (end stage renal disease) ([place])   . Full dentures   . GERD (gastroesophageal reflux disease)   . Gout   . Hypercholesteremia   . Hypertension   . Iron deficiency anemia   . Leukopenia   . PAF (paroxysmal atrial fibrillation) ([facility])    a. on Coumadin for anticoagulation  . Stroke ([place]) 2005  . Wears glasses     Past Surgical History:  Procedure Laterality Date  . ABDOMINAL AORTOGRAM W/LOWER EXTREMITY N/A 08/27/2017   Procedure: ABDOMINAL AORTOGRAM W/LOWER EXTREMITY;  Surgeon: Conrad [place], MD;  Location: [facility];  Service: Cardiovascular;  Laterality: N/A;  . ABDOMINAL HYSTERECTOMY    . AV FISTULA PLACEMENT Left 08/24/2013   Procedure: ARTERIOVENOUS (AV) FISTULA CREATION- LEFT BRACHIAL CEPHALIC;  Surgeon: Conrad [place], MD;  Location: [facility];  Service: Vascular;  Laterality: Left;  . CATARACT EXTRACTION, BILATERAL     lens implants  . COLON SURGERY  2010  . COLONOSCOPY  2008   Dr. Oneida Alar: 3-4 cm cecal polyp partially removed, path noting cecal  adenocarcinoma, underwent right hemicolectomy with TI resection  . COLONOSCOPY  2009   Dr. Oneida Alar: 87m sessile rectal polyp (benign), rare diverticula  . ESOPHAGOGASTRODUODENOSCOPY  2008   Dr. FOneida Alar normal esophagus  . ESOPHAGOGASTRODUODENOSCOPY  2009   Dr. FOneida Alar multiple antral erosions (reactive gastropathy), normal duodenum, normal esophagus  . EYE SURGERY    . GIVENS CAPSULE STUDY  2009   normal   . HEMICOLECTOMY  2008  . MULTIPLE TOOTH EXTRACTIONS    . PERIPHERAL VASCULAR CATHETERIZATION N/A 03/03/2016   Procedure: Fistulagram;  Surgeon: CAngelia Mould MD;  [facility];  Service: Cardiovascular;  Laterality: N/A;  . PERIPHERAL VASCULAR CATHETERIZATION Left 03/03/2016   Procedure: Peripheral Vascular Balloon Angioplasty;  Surgeon: CAngelia Mould MD;  [facility];  Service: Cardiovascular;  Laterality: Left;  arm fistula pta    Family History  Problem Relation Age of Onset  . Diabetes Mother   . Hypertension Mother   . Hyperlipidemia Daughter   . Varicose Veins Daughter   . Cancer Son   . Diabetes Son   . Heart disease Son   . Hyperlipidemia Son     Social History:  reports that she quit smoking about 32 years ago. Her smoking use included cigarettes. She has a 10.00 pack-year smoking history. she has never used smokeless tobacco. She  reports that she does not drink alcohol or use drugs.  Allergies: No Known Allergies  Medications:  Scheduled: . aspirin EC  81 mg Oral Daily  . [START ON 09/01/2017] diltiazem  240 mg Oral Daily   And  . diltiazem  120 mg Oral QHS  . enoxaparin (LOVENOX) injection  60 mg Subcutaneous Q24H  . fentaNYL      . linagliptin  5 mg Oral Daily  . multivitamin  1 tablet Oral QHS  . pantoprazole  40 mg Oral Daily  . sevelamer carbonate  3,200 mg Oral TID WC  . traMADol  100 mg Oral QHS     Results for orders placed or performed during the hospital encounter of 08/31/17 (from the past 48  hour(s))  Hemoglobin A1c     Status: Abnormal   Collection Time: 08/31/17  6:20 AM  Result Value Ref Range   Hgb A1c MFr Bld 6.6 (H) 4.8 - 5.6 %    Comment: (NOTE) Pre diabetes:          5.7%-6.4% Diabetes:              >6.4% Glycemic control for   <7.0% adults with diabetes    Mean Plasma Glucose 142.72 mg/dL  Glucose, capillary     Status: Abnormal   Collection Time: 08/31/17  6:26 AM  Result Value Ref Range   Glucose-Capillary 118 (H) 65 - 99 mg/dL  CBC     Status: Abnormal   Collection Time: 08/31/17  6:30 AM  Result Value Ref Range   WBC 4.8 4.0 - 10.5 K/uL   RBC 3.97 3.87 - 5.11 MIL/uL   Hemoglobin 11.0 (L) 12.0 - 15.0 g/dL   HCT 35.2 (L) 36.0 - 46.0 %   MCV 88.7 78.0 - 100.0 fL   MCH 27.7 26.0 - 34.0 pg   MCHC 31.3 30.0 - 36.0 g/dL   RDW 14.6 11.5 - 15.5 %   Platelets 235 150 - 400 K/uL  Comprehensive metabolic panel     Status: Abnormal   Collection Time: 08/31/17  6:30 AM  Result Value Ref Range   Sodium 139 135 - 145 mmol/L   Potassium 3.8 3.5 - 5.1 mmol/L   Chloride 100 (L) 101 - 111 mmol/L   CO2 23 22 - 32 mmol/L   Glucose, Bld 136 (H) 65 - 99 mg/dL   BUN 34 (H) 6 - 20 mg/dL   Creatinine, Ser 7.78 (H) 0.44 - 1.00 mg/dL   Calcium 9.9 8.9 - 10.3 mg/dL   Total Protein 7.2 6.5 - 8.1 g/dL   Albumin 3.3 (L) 3.5 - 5.0 g/dL   AST 14 (L) 15 - 41 U/L   ALT 11 (L) 14 - 54 U/L   Alkaline Phosphatase 71 38 - 126 U/L   Total Bilirubin 0.9 0.3 - 1.2 mg/dL   GFR calc non Af Amer 4 (L) >60 mL/min   GFR calc Af Amer 5 (L) >60 mL/min    Comment: (NOTE) The eGFR has been calculated using the CKD EPI equation. This calculation has not been validated in all clinical situations. eGFR's persistently <60 mL/min signify possible Chronic Kidney Disease.    Anion gap 16 (H) 5 - 15  Protime-INR     Status: None   Collection Time: 08/31/17  6:30 AM  Result Value Ref Range   Prothrombin Time 13.2 11.4 - 15.2 seconds   INR 1.01   Type and screen     Status: None   Collection  Time: 08/31/17  6:48 AM  Result Value Ref Range   ABO/RH(D) B POS    Antibody Screen NEG    Sample Expiration 09/03/2017   ABO/Rh     Status: None   Collection Time: 08/31/17  6:48 AM  Result Value Ref Range   ABO/RH(D) B POS   Glucose, capillary     Status: Abnormal   Collection Time: 08/31/17 11:12 AM  Result Value Ref Range   Glucose-Capillary 129 (H) 65 - 99 mg/dL   Comment 1 Notify RN    Comment 2 Document in Chart     No results found.  ROS: unobtainable due to pt's AMS from analgesia Blood pressure (!) 173/79, pulse 95, temperature 98.9 F (37.2 C), temperature source Oral, resp. rate 17, height 5' 4.5" (1.638 m), weight 55.3 kg (122 lb), SpO2 97 %. .  GEN elderly woman, NAD, lying in bed, doesn't open eyes or speak but follows commands HEENT eyes closed NECK no JVD PULM clear bilaterally no c/w/r CV irregular transmitted murmur from fistula ABD nondistended nontender NABS EXT No LE edema, R medial malleolus with 2-3 cm ulcer and L shin bandaged NEURO follows commands and moves all extremities ACCESS: LUE AVF + T/B  Assessment/Plan: 1 Critical LLE ischemia: s/p OR with DR Bridgett Larsson today.  There is also a concern regarding calciphylaxis.  Skin biopsies taken and we will follow those up too 2 ESRD: TTS schedule.  Next planned tomorrow 1/15. 3 Hypertension: no home antihypertensives listed on HD orders except for dilt- will follow and add meds if necessary 4. Anemia of ESRD: will give Aranesp here with HD if needed, Last Hgb 11.0 5. Metabolic Bone Disease: on sensipar 30 mg daily.  Renvela as binder 4 tabs with meals and 2 with snacks 6.  Nutrition: Alb 3.3, nepro 7.  Afib: on dilt, warfarin restart per vasc surg.    Madelon Lips 08/31/2017, 4:40 PM

## 2017-08-31 NOTE — Anesthesia Postprocedure Evaluation (Signed)
Anesthesia Post Note  Patient: Destiny Boyd  Procedure(s) Performed: LOWER EXTREMITY ANGIOGRAM LEFT LEG RUNOFF (Left ) ORBITAL ATHERECTOMY of Left Superficial Femoral  and Peroneal  Artery. Drug Coated ANGIOPLASTY of Left Superficial Femoral Artery. Angioplasty of Peroneal Artery (Left Leg Upper) BIOPSY OF SKIN SUBCUTANEOUS TISSUE  LEFT CALF (Left Leg Lower)     Patient location during evaluation: PACU Anesthesia Type: General Level of consciousness: awake and alert Pain management: pain level controlled Vital Signs Assessment: post-procedure vital signs reviewed and stable Respiratory status: spontaneous breathing, nonlabored ventilation and respiratory function stable Cardiovascular status: stable and blood pressure returned to baseline (Patient significantly hypertensize in preop, remains at preop baseline in PACU) Postop Assessment: no apparent nausea or vomiting Anesthetic complications: no    Last Vitals:  Vitals:   08/31/17 1220 08/31/17 1311  BP: (!) 194/96 (!) 163/89  Pulse:  85  Resp: 17 18  Temp: (!) 36.4 C   SpO2: 98% 100%    Last Pain:  Vitals:   08/31/17 1220  TempSrc: Oral  PainSc:                  Audry Pili

## 2017-09-01 ENCOUNTER — Encounter (HOSPITAL_COMMUNITY): Payer: Self-pay | Admitting: General Practice

## 2017-09-01 ENCOUNTER — Other Ambulatory Visit: Payer: Self-pay

## 2017-09-01 LAB — COMPREHENSIVE METABOLIC PANEL
ALK PHOS: 68 U/L (ref 38–126)
ALT: 11 U/L — ABNORMAL LOW (ref 14–54)
ANION GAP: 15 (ref 5–15)
AST: 19 U/L (ref 15–41)
Albumin: 2.8 g/dL — ABNORMAL LOW (ref 3.5–5.0)
BUN: 45 mg/dL — ABNORMAL HIGH (ref 6–20)
CALCIUM: 9.3 mg/dL (ref 8.9–10.3)
CHLORIDE: 100 mmol/L — AB (ref 101–111)
CO2: 23 mmol/L (ref 22–32)
Creatinine, Ser: 9.16 mg/dL — ABNORMAL HIGH (ref 0.44–1.00)
GFR calc non Af Amer: 4 mL/min — ABNORMAL LOW (ref >60)
GFR, EST AFRICAN AMERICAN: 4 mL/min — AB (ref >60)
Glucose, Bld: 117 mg/dL — ABNORMAL HIGH (ref 65–99)
Potassium: 4.3 mmol/L (ref 3.5–5.1)
Sodium: 138 mmol/L (ref 135–145)
Total Bilirubin: 0.7 mg/dL (ref 0.3–1.2)
Total Protein: 6.2 g/dL — ABNORMAL LOW (ref 6.5–8.1)

## 2017-09-01 LAB — CBC
HCT: 32.5 % — ABNORMAL LOW (ref 36.0–46.0)
HEMATOCRIT: 31.9 % — AB (ref 36.0–46.0)
Hemoglobin: 10.3 g/dL — ABNORMAL LOW (ref 12.0–15.0)
Hemoglobin: 10.3 g/dL — ABNORMAL LOW (ref 12.0–15.0)
MCH: 27.8 pg (ref 26.0–34.0)
MCH: 28.1 pg (ref 26.0–34.0)
MCHC: 31.7 g/dL (ref 30.0–36.0)
MCHC: 32.3 g/dL (ref 30.0–36.0)
MCV: 87.8 fL (ref 78.0–100.0)
MCV: 87.2 fL (ref 78.0–100.0)
PLATELETS: 195 10*3/uL (ref 150–400)
PLATELETS: 233 10*3/uL (ref 150–400)
RBC: 3.7 MIL/uL — ABNORMAL LOW (ref 3.87–5.11)
RBC: 3.66 MIL/uL — ABNORMAL LOW (ref 3.87–5.11)
RDW: 14.9 % (ref 11.5–15.5)
RDW: 14.9 % (ref 11.5–15.5)
WBC: 5.5 10*3/uL (ref 4.0–10.5)
WBC: 7 10*3/uL (ref 4.0–10.5)

## 2017-09-01 LAB — RENAL FUNCTION PANEL
ALBUMIN: 2.7 g/dL — AB (ref 3.5–5.0)
Anion gap: 15 (ref 5–15)
BUN: 51 mg/dL — AB (ref 6–20)
CALCIUM: 9.3 mg/dL (ref 8.9–10.3)
CO2: 24 mmol/L (ref 22–32)
Chloride: 97 mmol/L — ABNORMAL LOW (ref 101–111)
Creatinine, Ser: 9.65 mg/dL — ABNORMAL HIGH (ref 0.44–1.00)
GFR calc Af Amer: 4 mL/min — ABNORMAL LOW (ref >60)
GFR, EST NON AFRICAN AMERICAN: 3 mL/min — AB (ref >60)
Glucose, Bld: 164 mg/dL — ABNORMAL HIGH (ref 65–99)
PHOSPHORUS: 7.5 mg/dL — AB (ref 2.5–4.6)
POTASSIUM: 4.6 mmol/L (ref 3.5–5.1)
SODIUM: 136 mmol/L (ref 135–145)

## 2017-09-01 MED ORDER — NEPRO/CARBSTEADY PO LIQD
237.0000 mL | Freq: Two times a day (BID) | ORAL | Status: DC
  Administered 2017-09-01 – 2017-09-03 (×2): 237 mL via ORAL
  Filled 2017-09-01: qty 237

## 2017-09-01 MED ORDER — MORPHINE SULFATE (PF) 4 MG/ML IV SOLN
INTRAVENOUS | Status: AC
  Administered 2017-09-01: 4 mg via INTRAVENOUS
  Filled 2017-09-01: qty 1

## 2017-09-01 MED ORDER — OXYCODONE-ACETAMINOPHEN 5-325 MG PO TABS: 1.0000 | ORAL_TABLET | Freq: Four times a day (QID) | ORAL | 0 refills | Status: DC | PRN

## 2017-09-01 NOTE — Progress Notes (Addendum)
   Daily Progress Note   Assessment/Planning:   POD #1 s/p antegrade cann L CFA, SFA OA+PTA, peroneal OA+PTA   Appreciate Renal's assistance  Awaiting skin biopsy x 2  Pt was admitted overnight for HD and to get Renal's involvement with possible calciphylaxis  Pt can follow up in the next 2-4 weeks.  Remains to be seen if endovasc. intervention adequate.     Subjective  - 1 Day Post-Op   Admitted for HD and coordination in regards possible calciphylaxis, says she feels unwell this AM   Objective   Vitals:   08/31/17 2300 09/01/17 0400 09/01/17 0626 09/01/17 0700  BP: (!) 155/85 (!) 159/86 (!) 157/82 (!) 158/83  Pulse: (!) 105 91 81 85  Resp: 15 13 10 11   Temp: 98.8 F (37.1 C) 97.9 F (36.6 C)    TempSrc: Oral Oral    SpO2: 96% 100% 99% 94%  Weight:      Height:         Intake/Output Summary (Last 24 hours) at 09/01/2017 0732 Last data filed at 08/31/2017 2134 Gross per 24 hour  Intake 700 ml  Output 100 ml  Net 600 ml    PULM  CTAB  CV  RRR  GI  soft, NTND  VASC No hematoma at cannulation site, L calf warmer than yesterday, no palpable pulses  NEURO DNVI    Laboratory   CBC CBC Latest Ref Rng & Units 09/01/2017 08/31/2017 08/27/2017  WBC 4.0 - 10.5 K/uL 5.5 4.8 -  Hemoglobin 12.0 - 15.0 g/dL 10.3(L) 11.0(L) 12.6  Hematocrit 36.0 - 46.0 % 32.5(L) 35.2(L) 37.0  Platelets 150 - 400 K/uL 195 235 -    BMET    Component Value Date/Time   NA 138 09/01/2017 0350   K 4.3 09/01/2017 0350   CL 100 (L) 09/01/2017 0350   CO2 23 09/01/2017 0350   GLUCOSE 117 (H) 09/01/2017 0350   BUN 45 (H) 09/01/2017 0350   CREATININE 9.16 (H) 09/01/2017 0350   CALCIUM 9.3 09/01/2017 0350   GFRNONAA 4 (L) 09/01/2017 0350   GFRAA 4 (L) 09/01/2017 0350     Adele Barthel, MD, FACS [facility] Office: [phone] Pager: [phone]  09/01/2017, 7:32 AM

## 2017-09-01 NOTE — Progress Notes (Signed)
HD tx completed w/ bp and HR/rhythm issues from about half way through tx requiring UF for approximately 52 min of tx, UF goal not met, blood rinsed back, VSS at time of report, report called to Donnetta Hutching, RN

## 2017-09-01 NOTE — Progress Notes (Signed)
Patient back to room from dialysis. Alert and verbalizes needs. No c/o dizziness. Denies pain. HR 120s-130s B/p 91/46, Given cardizem as scheduled. Daughter,[name] at bedside. Plan of care discussed with her. Patient's daughter says that she will be back in the morning at [facility]. Patient answers questions appropriately. No distress at this time.

## 2017-09-01 NOTE — Progress Notes (Signed)
Dr Trula Slade made aware of patient's family's request for needing a home health nurse to see about patient. Notes reviewed. No social work consult was noted to chart. Patient's daughter also says that her mother's pain level is not tolerable with her percocet or morphine orders. Dr. Trula Slade says to discontinue discharge to stay another night and let Dr. Bridgett Larsson be aware in the am.  Rescreened for social service consult at this time.

## 2017-09-01 NOTE — Discharge Summary (Addendum)
Physician Discharge Summary   Patient ID: Destiny Boyd [id number] 83 y.o. [birth year]  Admit date: 08/31/2017  Discharge date and time: 09/03/17   Admitting Physician: Conrad Gonvick, MD   Discharge Physician: Dr. Bridgett Larsson  Admission Diagnoses: PERIPHERAL VASCULAR DISEASE WITH ULCERS BILATERAL LOWER EXTREMITIES  Discharge Diagnoses: same  Admission Condition: fair  Discharged Condition: fair  Indication for Admission: left critical limb ischemia with possible calciphylaxis  Hospital Course: Destiny Boyd is a 83y.o. Female who came in as an outpatient for LLE arteriogram with SFA and peroneal atherectomy and angioplasty of peroneal as well as skin biopsy x2.  She was admitted to the hospital overnight due to suspected caliciphylaxis as well as for management of ESRD on dialysis.  Dr. Hollie Salk will follow up with patient after skin biopsies are resulted.  She will have HD treatment POD#1 prior to discharge home.  She will resume her home dose of coumadin.  She will be prescribed #10 5/325mg  percocet for continued post operative pain control.  She will be called to follow up with Dr. Bridgett Larsson in office in about 2-4 weeks.  Discharge instructions were reviewed with the patient and daughter and they endorse their understanding.  The patient will be discharged home after dialysis if ok with nephrology in stable condition.  ADDENDUM 09/03/17 Discharge was cancelled on 09/01/17.  Daughter was concerned about being able to care for her mother at home.  SW, Case management, PT, and OT were all consulted to help with discharge planning to identify outpatient needs.  There was talk between daughter and patient about placement in SNF.  Today patient and daughter have again agreed to discharge home with home health RN as well as recommendation for home PT.  She will dialyze again today under the care of Nephrology.  Skin biopsy still has not resulted however vascular as well as Nephrology will follow up on results.   Patient will follow up in office in about 2 weeks.  After dialysis, patient will be discharged home with home health in stable condition.   Consults: nephrology  Treatments: surgery: Dr. Bridgett Larsson 08/31/17 1.  Left common femoral artery antegrade cannulation under ultrasound guidance 2.  Left leg runoff 3.  Placement of embolic protection device (large Navshield) 4.  Orbital atherectomy left superficial femoral artery (Diamondback Stealth 2.0 Solid) 5.  Drug coated angioplasty of left superficial femoral artery (Lutonix 5 mm x 100 mm) 6.  Orbital atherectomy left peroneal artery (Diamondback Stealth 1.25 Micro) 7.  Angioplasty left peroneal artery (3 mm x 220 mm) 8.  Intra-arterial administration of nitroglycerin (1600 mcg) 9.  Skin biopsy left lateral calf x 2    Discharge Exam: see progress note 09/03/17 Vitals:   09/03/17 0900 09/03/17 0930  BP: 124/63 (!) 121/57  Pulse: 75 74  Resp: 10 10  Temp:    SpO2:      Disposition: 01-Home or Self Care  Patient Instructions:  Allergies as of 09/03/2017   No Known Allergies     Medication List    TAKE these medications   aspirin EC 81 MG tablet Take 81 mg by mouth daily.   diltiazem 120 MG 24 hr capsule Commonly known as:  CARDIZEM CD Take 240 mg ( 2 tablets ) in th am, and take 120 mg ( 1 tablet)  in the pm What changed:    how much to take  how to take this  when to take this  additional instructions   enoxaparin 60 MG/0.6ML injection Commonly  known as:  LOVENOX Inject 0.6 mLs (60 mg total) into the skin daily for 10 days.   multivitamin Tabs tablet Take 1 tablet by mouth daily.   oxyCODONE-acetaminophen 5-325 MG tablet Commonly known as:  PERCOCET/ROXICET Take 1 tablet by mouth every 6 (six) hours as needed for moderate pain. Notes to patient:  Last dose: 09/01/17 @ 3:00 PM   pantoprazole 40 MG tablet Commonly known as:  PROTONIX Take 1 tablet (40 mg total) by mouth daily.   sevelamer carbonate 800 MG  tablet Commonly known as:  RENVELA Take 3,200 mg by mouth 3 (three) times daily with meals. & 1600 by mouth with snacks   TRADJENTA 5 MG Tabs tablet Generic drug:  linagliptin Take 1 tablet by mouth daily.   traMADol 50 MG tablet Commonly known as:  ULTRAM Take 100 mg by mouth at bedtime.   warfarin 5 MG tablet Commonly known as:  COUMADIN Take as directed. If you are unsure how to take this medication, talk to your nurse or doctor. Original instructions:  Take 1 tablet (5 mg total) by mouth one time only at 6 PM.      Activity: activity as tolerated Diet: regular diet Wound Care: keep wound clean and dry  Follow-up with Dr. Bridgett Larsson in 2 weeks.  SignedDagoberto Ligas 09/03/2017 11:48 AM   Addendum  Destiny Boyd is a 84 y.o. (Jan 04, 1934) female with extensive calcification of entire arterial system.  She underwent OA+PTA distal L SFA and OTA+PTA L peroneal artery.  She was admitted post-procedure for HD.  Biopsy result of two ulcers in left calf are pending, with concern for calciphylaxis.  While admitted, her daughter raised concerns with her ability to care for her mother.  OT/PT evaluations were completed and eventually SNF placement was determined to be the best option for care in this patient.  The patient will follow up in 2-4 weeks to re-evaluate her biopsy wounds.   Adele Barthel, MD, FACS [facility] Office: [phone] Pager: [phone]  09/03/2017, 11:48 AM

## 2017-09-01 NOTE — Progress Notes (Signed)
HD tx initiated via 15G x2 @ 1723 by Sherren Mocha, RN w/o problem, pull/push/flush equally w/o problem per report from Lea, RN, VSS, Will cont to monitor while on HD tx

## 2017-09-01 NOTE — Progress Notes (Signed)
  [facility] Progress Note   Assessment/ Plan:   1 Critical LLE ischemia: s/p OR with DR Bridgett Larsson today.  There is also a concern regarding calciphylaxis- skin biopsies showing ulcer and necrosis but no Ca deposits characteristic of calciphylaxis 2 ESRD: TTS schedule.  Sched today. 3 Hypertension: no home antihypertensives listed on HD orders except for dilt- will follow and add meds if necessary 4. Anemia of ESRD: will give Aranesp here with HD if needed, Last Hgb 11.0 5. Metabolic Bone Disease: on sensipar 30 mg daily.  Renvela as binder 4 tabs with meals and 2 with snacks 6.  Nutrition: Alb 3.3, nepro 7.  Afib: on dilt, warfarin restart per vasc surg.   8.  Dispo: OK from renal perspective to go after HD today   Subjective:    On HD today.  Skin biopsy doesn't appear to be characteristic of calciphylaxis   Objective:   BP (!) (P) 164/83   Pulse (P) 90   Temp (P) 98.1 F (36.7 C) (Oral)   Resp (P) 12   Ht 5' 4.5" (1.638 m)   Wt 58 kg (127 lb 13.9 oz)   SpO2 (P) 91%   BMI 21.61 kg/m   Physical Exam: GEN elderly woman, NAD, lying in bed, awake today HEENT eyes closed NECK no JVD PULM clear bilaterally no c/w/r CV irregular transmitted murmur from fistula ABD nondistended nontender NABS EXT No LE edema, R medial malleolus with 2-3 cm ulcer and L shin bandaged NEURO follows commands and moves all extremities ACCESS: LUE AVF + T/B   Labs: BMET Recent Labs  Lab 08/27/17 0905 08/31/17 0630 09/01/17 0350  NA 140 139 138  K 3.9 3.8 4.3  CL 99* 100* 100*  CO2  --  23 23  GLUCOSE 142* 136* 117*  BUN 20 34* 45*  CREATININE 5.00* 7.78* 9.16*  CALCIUM  --  9.9 9.3   CBC Recent Labs  Lab 08/27/17 0905 08/31/17 0630 09/01/17 0350  WBC  --  4.8 5.5  HGB 12.6 11.0* 10.3*  HCT 37.0 35.2* 32.5*  MCV  --  88.7 87.8  PLT  --  235 195    @IMGRELPRIORS @ Medications:    . aspirin EC  81 mg Oral Daily  . diltiazem  240 mg Oral Daily   And  . diltiazem   120 mg Oral QHS  . enoxaparin (LOVENOX) injection  60 mg Subcutaneous Q24H  . feeding supplement (NEPRO CARB STEADY)  237 mL Oral BID BM  . linagliptin  5 mg Oral Daily  . multivitamin  1 tablet Oral QHS  . pantoprazole  40 mg Oral Daily  . sevelamer carbonate  3,200 mg Oral TID WC  . traMADol  100 mg Oral QHS     Madelon Lips MD [facility] pgr [phone] 09/01/2017, 5:42 PM

## 2017-09-02 MED ORDER — WARFARIN SODIUM 5 MG PO TABS
5.0000 mg | ORAL_TABLET | Freq: Once | ORAL | Status: AC
  Administered 2017-09-02: 5 mg via ORAL
  Filled 2017-09-02: qty 1

## 2017-09-02 MED ORDER — WARFARIN - PHARMACIST DOSING INPATIENT
Freq: Every day | Status: DC
  Administered 2017-09-02 – 2017-09-03 (×2)

## 2017-09-02 NOTE — Progress Notes (Signed)
ANTICOAGULATION CONSULT NOTE - Follow Up Consult  Pharmacy Consult for Lovenox; resume Coumadin Indication: atrial fibrillation  No Known Allergies  Patient Measurements: Height: 5' 4.5" (163.8 cm) Weight: 125 lb 10.6 oz (57 kg) IBW/kg (Calculated) : 55.85  Vital Signs: Temp: 99.3 F (37.4 C) (01/16 0800) Temp Source: Oral (01/16 0800) BP: 106/64 (01/16 0800) Pulse Rate: 68 (01/16 0800)  Labs: Recent Labs    08/31/17 0630 09/01/17 0350 09/01/17 1735  HGB 11.0* 10.3* 10.3*  HCT 35.2* 32.5* 31.9*  PLT 235 195 233  LABPROT 13.2  --   --   INR 1.01  --   --   CREATININE 7.78* 9.16* 9.65*    Estimated Creatinine Clearance: 4 mL/min (A) (by C-G formula based on SCr of 9.65 mg/dL (H)).   Assessment: 83yof on coumadin for afib. Coumadin was being held and she was bridged outpatient with lovenox anticipating multiple vascular procedures. S/P LLE angiogram/calf biopsy on 1/14. Lovenox resumed 1/14 PM and coumadin to resume today. INR 1 on admit.  Patient's daughter couldn't remember what her home dose was but verified that she had 2.5mg  tablets. Last dose per clinic visit 06/15/17 was 5mg  daily except 2.5mg  MWF with INR 3.5.  Goal of Therapy:  INR 2-3 Anti-Xa level 0.6-1 units/ml 4hrs after LMWH dose given Monitor platelets by anticoagulation protocol: Yes   Plan:  1) Continue lovenox 60mg  sq q24 2) Coumadin 5mg  tonight 3) Daily INR  Deboraha Sprang 09/02/2017,9:45 AM

## 2017-09-02 NOTE — Evaluation (Signed)
Physical Therapy Evaluation Patient Details Name: Destiny Boyd MRN: [id number] DOB: 1933/12/22 Today's Date: 09/02/2017   History of Present Illness  Pt is an 84 year old woman now s/p antigrade L CFA, SFA OT + PTA, peroneal OA + PTA. PMH: ESRD, HTN, colon cancer, CVA, PAF.    Clinical Impression  Pt presented supine in bed with HOB elevated, awake and willing to participate in therapy session. Prior to admission, pt reported that she was independent with all functional mobility and ADLs. Pt lives alone but plans to stay at her daughter's while she is recovering from her procedure. Pt currently requires min guard for safety with transfers and min guard to ambulate with use of RW. Pt reported no pain throughout session. Pt would continue to benefit from skilled physical therapy services at this time while admitted and after d/c to address the below listed limitations in order to improve overall safety and independence with functional mobility.     Follow Up Recommendations Home health PT    Equipment Recommendations  None recommended by PT    Recommendations for Other Services       Precautions / Restrictions Precautions Precautions: Fall Restrictions Weight Bearing Restrictions: No      Mobility  Bed Mobility Overal bed mobility: Modified Independent             General bed mobility comments: HOB up  Transfers Overall transfer level: Needs assistance Equipment used: Rolling walker (2 wheeled);None Transfers: Sit to/from Stand Sit to Stand: Min guard         General transfer comment: initially pt not using RW but stabilizing with bilateral LEs against bed  Ambulation/Gait Ambulation/Gait assistance: Min guard Ambulation Distance (Feet): 150 Feet Assistive device: Rolling walker (2 wheeled) Gait Pattern/deviations: Step-through pattern;Decreased step length - right;Decreased step length - left;Decreased stride length Gait velocity: decreased   General Gait  Details: mild instability but no overt LOB or need for physical assistance, min guard for safety with use of RW  Stairs            Wheelchair Mobility    Modified Rankin (Stroke Patients Only)       Balance Overall balance assessment: Needs assistance Sitting-balance support: Feet supported Sitting balance-Leahy Scale: Good     Standing balance support: During functional activity;No upper extremity supported Standing balance-Leahy Scale: Fair Standing balance comment: can release walker in static standing without LOB                             Pertinent Vitals/Pain Pain Assessment: Faces Faces Pain Scale: No hurt Pain Intervention(s): Monitored during session    Home Living Family/patient expects to be discharged to:: Private residence Living Arrangements: Alone Available Help at Discharge: Family;Available PRN/intermittently(plans to go home with daughter) Type of Home: House Home Access: Stairs to enter   [employer] of Steps: 2 Home Layout: One level Home Equipment: Walker - 4 wheels      Prior Function Level of Independence: Independent         Comments: does not use any DME, reports she does all cooking and cleaning     Hand Dominance   Dominant Hand: Right    Extremity/Trunk Assessment   Upper Extremity Assessment Upper Extremity Assessment: Defer to OT evaluation    Lower Extremity Assessment Lower Extremity Assessment: LLE deficits/detail LLE Deficits / Details: decreased sensation to light touch in lower leg and foot as compared to R LE;  pt able to tolerate full weight bearing with standing and ambulation       Communication   Communication: No difficulties  Cognition Arousal/Alertness: Awake/alert Behavior During Therapy: WFL for tasks assessed/performed Overall Cognitive Status: No family/caregiver present to determine baseline cognitive functioning Area of Impairment: Memory;Safety/judgement;Problem  solving;Attention                   Current Attention Level: Selective Memory: Decreased short-term memory   Safety/Judgement: Decreased awareness of safety;Decreased awareness of deficits   Problem Solving: Requires verbal cues;Difficulty sequencing General Comments: difficulty with history      General Comments      Exercises     Assessment/Plan    PT Assessment Patient needs continued PT services  PT Problem List Decreased balance;Decreased mobility;Decreased coordination;Decreased safety awareness       PT Treatment Interventions DME instruction;Gait training;Stair training;Functional mobility training;Therapeutic activities;Therapeutic exercise;Balance training;Patient/family education;Neuromuscular re-education;Cognitive remediation    PT Goals (Current goals can be found in the Care Plan section)  Acute Rehab PT Goals Patient Stated Goal: to go home with her daughter and later return to her own home PT Goal Formulation: With patient Time For Goal Achievement: 09/16/17 Potential to Achieve Goals: Good    Frequency Min 3X/week   Barriers to discharge        Co-evaluation PT/OT/SLP Co-Evaluation/Treatment: Yes Reason for Co-Treatment: For patient/therapist safety;To address functional/ADL transfers;Other (comment)(per RN-pt with great difficulty with mobility, very painful) PT goals addressed during session: Mobility/safety with mobility;Balance;Proper use of DME;Strengthening/ROM OT goals addressed during session: ADL's and self-care       AM-PAC PT "6 Clicks" Daily Activity  Outcome Measure Difficulty turning over in bed (including adjusting bedclothes, sheets and blankets)?: None Difficulty moving from lying on back to sitting on the side of the bed? : None Difficulty sitting down on and standing up from a chair with arms (e.g., wheelchair, bedside commode, etc,.)?: Unable Help needed moving to and from a bed to chair (including a wheelchair)?: A  Little Help needed walking in hospital room?: A Little Help needed climbing 3-5 steps with a railing? : A Little 6 Click Score: 18    End of Session Equipment Utilized During Treatment: Gait belt Activity Tolerance: Patient tolerated treatment well Patient left: in chair;with call bell/phone within reach;with chair alarm set Nurse Communication: Mobility status PT Visit Diagnosis: Other abnormalities of gait and mobility (R26.89)    Time: 3016-0109 PT Time Calculation (min) (ACUTE ONLY): 19 min   Charges:   PT Evaluation $PT Eval Low Complexity: 1 Low     PT G Codes:        [place], PT, [place] 09/02/2017, 3:39 PM

## 2017-09-02 NOTE — Evaluation (Signed)
Occupational Therapy Evaluation and Discharge Patient Details Name: Destiny Boyd MRN: [id number] DOB: [birth year] Today's Date: 09/02/2017    History of Present Illness Pt is an 84 year old woman now s/p antigrade L CFA, SFA OT + PTA, peroneal OA + PTA. PMH: ESRD, HTN, colon cancer, CVA, PAF.   Clinical Impression   Pt is typically independent in mobility, ADL and IADL. She requires min guard to supervision currently and will need 24 hour care and use of her RW initially upon d/c. She reports she will be staying with her daughter and her son will also be available to help. No further OT needs.    Follow Up Recommendations  No OT follow up    Equipment Recommendations  None recommended by OT    Recommendations for Other Services       Precautions / Restrictions Precautions Precautions: Fall      Mobility Bed Mobility Overal bed mobility: Modified Independent             General bed mobility comments: HOB up  Transfers Overall transfer level: Needs assistance Equipment used: Rolling walker (2 wheeled) Transfers: Sit to/from Stand Sit to Stand: Min guard         General transfer comment: pt stabilizing with B LEs against bed initially    Balance Overall balance assessment: Needs assistance   Sitting balance-Leahy Scale: Good       Standing balance-Leahy Scale: Fair Standing balance comment: can release walker in static standing without LOB                           ADL either performed or assessed with clinical judgement   ADL Overall ADL's : Needs assistance/impaired Eating/Feeding: Independent;Sitting   Grooming: Supervision/safety;Standing   Upper Body Bathing: Set up;Sitting   Lower Body Bathing: Min guard;Sit to/from stand   Upper Body Dressing : Set up;Sitting   Lower Body Dressing: Min guard;Sit to/from stand Lower Body Dressing Details (indicate cue type and reason): able to don L sock Toilet Transfer: Min  guard;RW;Ambulation   Toileting- Clothing Manipulation and Hygiene: Min guard;Sit to/from stand       Functional mobility during ADLs: Min guard;Rolling walker       Vision Patient Visual Report: No change from baseline       Perception     Praxis      Pertinent Vitals/Pain Pain Assessment: No/denies pain     Hand Dominance Right   Extremity/Trunk Assessment Upper Extremity Assessment Upper Extremity Assessment: Overall WFL for tasks assessed   Lower Extremity Assessment Lower Extremity Assessment: Defer to PT evaluation       Communication Communication Communication: No difficulties   Cognition Arousal/Alertness: Awake/alert Behavior During Therapy: WFL for tasks assessed/performed Overall Cognitive Status: No family/caregiver present to determine baseline cognitive functioning                                 General Comments: difficulty with history   General Comments       Exercises     Shoulder Instructions      Home Living Family/patient expects to be discharged to:: Private residence Living Arrangements: Alone Available Help at Discharge: Family;Available PRN/intermittently(plans to go home with daughter) Type of Home: House Home Access: Stairs to enter [employer] of Steps: 2   Home Layout: One level     Bathroom Shower/Tub: Tub/shower unit  Bathroom Toilet: Handicapped height     Home Equipment: Environmental consultant - 4 wheels          Prior Functioning/Environment Level of Independence: Independent        Comments: does not use any DME, reports she does all cooking and cleaning        OT Problem List:        OT Treatment/Interventions:      OT Goals(Current goals can be found in the care plan section) Acute Rehab OT Goals Patient Stated Goal: to go home with her daughter and later return to her own home  OT Frequency:     Barriers to D/C:            Co-evaluation PT/OT/SLP Co-Evaluation/Treatment:  Yes Reason for Co-Treatment: For patient/therapist safety   OT goals addressed during session: ADL's and self-care      AM-PAC PT "6 Clicks" Daily Activity     Outcome Measure Help from another person eating meals?: None Help from another person taking care of personal grooming?: A Little Help from another person toileting, which includes using toliet, bedpan, or urinal?: A Little Help from another person bathing (including washing, rinsing, drying)?: A Little Help from another person to put on and taking off regular upper body clothing?: None Help from another person to put on and taking off regular lower body clothing?: A Little 6 Click Score: 20   End of Session Equipment Utilized During Treatment: Gait belt;Rolling walker Nurse Communication: Mobility status  Activity Tolerance: Patient tolerated treatment well Patient left: in chair;with call bell/phone within reach;with chair alarm set  OT Visit Diagnosis: Unsteadiness on feet (R26.81);Other abnormalities of gait and mobility (R26.89)                Time: 2449-7530 OT Time Calculation (min): 23 min Charges:  OT General Charges $OT Visit: 1 Visit OT Evaluation $OT Eval Low Complexity: 1 Low G-Codes:     Destiny Boyd 09/02/2017, 2:45 PM  09/02/2017 Destiny Boyd, OTR/L Pager: [phone]

## 2017-09-02 NOTE — Progress Notes (Signed)
  [facility] Progress Note   Assessment/ Plan:   1 Critical LLE ischemia: s/p OR with DR Bridgett Larsson 1/14.  There is also a concern regarding calciphylaxis- skin biopsies showing ulcer and necrosis, need to process biopsies for Ca. 2 ESRD: TTS schedule.  Sched tomorrow 3 Hypertension: no home antihypertensives listed on HD orders except for dilt- will follow and add meds if necessary 4. Anemia of ESRD: will give Aranesp here with HD if needed, Last Hgb 11.0 5. Metabolic Bone Disease: on sensipar 30 mg daily.  Renvela as binder 4 tabs with meals and 2 with snacks 6.  Nutrition: Alb 3.3, nepro 7.  Afib: on dilt, warfarin restart per vasc surg.   8.  Dispo: pending help at home per dtr   Subjective:    NAD, sitting in chair. Skin biopsy doesn't appear to be characteristic of calciphylaxis--> but actually sample wasn't processed for Ca so we are awaiting that now.   Objective:   BP 111/60 (BP Location: Right Arm)   Pulse (!) 55   Temp 98.7 F (37.1 C) (Oral)   Resp 12   Ht 5' 4.5" (1.638 m)   Wt 57 kg (125 lb 10.6 oz)   SpO2 94%   BMI 21.24 kg/m   Physical Exam: GEN elderly woman, NAD, lying in bed, awake today HEENT eyes closed NECK no JVD PULM clear bilaterally no c/w/r CV irregular transmitted murmur from fistula ABD nondistended nontender NABS EXT No LE edema, R medial malleolus with 2-3 cm ulcer and L shin bandaged NEURO follows commands and moves all extremities ACCESS: LUE AVF + T/B   Labs: BMET Recent Labs  Lab 08/27/17 0905 08/31/17 0630 09/01/17 0350 09/01/17 1735  NA 140 139 138 136  K 3.9 3.8 4.3 4.6  CL 99* 100* 100* 97*  CO2  --  23 23 24   GLUCOSE 142* 136* 117* 164*  BUN 20 34* 45* 51*  CREATININE 5.00* 7.78* 9.16* 9.65*  CALCIUM  --  9.9 9.3 9.3  PHOS  --   --   --  7.5*   CBC Recent Labs  Lab 08/27/17 0905 08/31/17 0630 09/01/17 0350 09/01/17 1735  WBC  --  4.8 5.5 7.0  HGB 12.6 11.0* 10.3* 10.3*  HCT 37.0 35.2* 32.5* 31.9*   MCV  --  88.7 87.8 87.2  PLT  --  235 195 233    @IMGRELPRIORS @ Medications:    . aspirin EC  81 mg Oral Daily  . diltiazem  240 mg Oral Daily   And  . diltiazem  120 mg Oral QHS  . enoxaparin (LOVENOX) injection  60 mg Subcutaneous Q24H  . feeding supplement (NEPRO CARB STEADY)  237 mL Oral BID BM  . linagliptin  5 mg Oral Daily  . multivitamin  1 tablet Oral QHS  . pantoprazole  40 mg Oral Daily  . sevelamer carbonate  3,200 mg Oral TID WC  . traMADol  100 mg Oral QHS  . warfarin  5 mg Oral ONCE-1800  . Warfarin - Pharmacist Dosing Inpatient   Does not apply q1800     Madelon Lips MD [facility] pgr [phone] 09/02/2017, 4:39 PM

## 2017-09-02 NOTE — Consult Note (Addendum)
   [facility] CM Inpatient Consult   09/02/2017  Destiny Boyd [birth year] [id number]    Referral received from inpatient RNCM indicating patient could use the additional support and follow up. Patient lives alone. Family has questions regarding medication management and the like.  Went to bedside to speak with Destiny Boyd and daughter, Destiny Boyd, about [facility] Care Management program.   Both patient and daughter are agreeable and [facility] Care Management written consent was obtained. [facility] folder provided as well.  Patient asked that her daughter, Destiny Boyd, be contacted for post transition of care calls. Destiny Boyd (daughter) [phone].  Destiny Boyd lives alone.  Daughter reports she lives about 15 miles away from patient. [employer] is used for transportation. Daughter indicates she is in the process of applying for Medicaid on her mother's behalf. Primary Care MD is Dr. Legrand Rams. Daughter states she is somewhat concerned about medication affordability. States patient has limited income.  Discussed referral for [facility] Pharmacist due to concerns for medication affordability. Also discussed referral for [place]. Both patient and daughter are agreeable.  Will refer to [place] for transition of care due to high risk for readmission. Destiny Boyd goes to HD on Tuesdays, Thursdays, Saturdays. History of colon cancer, ESRD, HTN, PAF, DM, CVA.    Marthenia Rolling, MSN-Ed, RN,BSN [facility] Liaison [phone]

## 2017-09-02 NOTE — Progress Notes (Signed)
Initial Nutrition Assessment  DOCUMENTATION CODES:   Not applicable  INTERVENTION:    Continue Nepro Shake po BID, each supplement provides 425 kcal and 19 grams protein  NUTRITION DIAGNOSIS:   Increased nutrient needs related to chronic illness as evidenced by estimated needs  GOAL:   Patient will meet greater than or equal to 90% of their needs  MONITOR:   PO intake, Supplement acceptance, Labs, Skin, Weight trends  REASON FOR ASSESSMENT:   Malnutrition Screening Tool  ASSESSMENT:   84 y.o. Female with ESRD and DM who presents with chief complaint: non-healing wound in both legs.  Onset of symptoms occurred months ago, with development of progressing darkened patches the skin overlying her L lateral calf and R ankle.   RD spoke with pt's daughter at bedside. Reports pt lives alone. Also states her Mother hasn't been eating very well. Pt consumed a little of her chicken, potatoes and greens for lunch today.  Pt drank a Butter Pecan Nepro Shake during hospitalization and liked it. Daughter would like a prescription for her Mother to continue the supplement. Spoke with Marvetta Gibbons, Case Manager.  Medications: Miralax, Protonix, Rena-Vit and Coumadin. Labs reviewed. BUN 51 (H). P 7.5 (H). CBG's 118-129.  NUTRITION - FOCUSED PHYSICAL EXAM:  Unable to assess at this time.  Diet Order:  Diet renal with fluid restriction Fluid restriction: 1200 mL Fluid; Room service appropriate? Yes; Fluid consistency: Thin  EDUCATION NEEDS:   No education needs have been identified at this time  Skin:  Skin Assessment: Skin Integrity Issues: Skin Integrity Issues:: Other (Comment) Other: blackened non-healing wound to R ankle  Last BM:  1/14  Height:   Ht Readings from Last 1 Encounters:  08/31/17 5' 4.5" (1.638 m)   Weight:   Wt Readings from Last 1 Encounters:  09/01/17 125 lb 10.6 oz (57 kg)   Ideal Body Weight:  54.4 kg  BMI:  Body mass index is 21.24  kg/m.  Estimated Nutritional Needs:   Kcal:  1700-1900  Protein:  80-95 gm  Fluid:  1200 ml  Arthur Holms, RD, LDN Pager #: [phone] After-Hours Pager #: [phone]

## 2017-09-02 NOTE — Progress Notes (Addendum)
  Progress Note    09/02/2017 8:37 AM 2 Days Post-Op  Subjective:  Discharge cancelled due to pain level as well as daughter's request for help at home.   Vitals:   09/02/17 0400 09/02/17 0800  BP: (!) 97/46 106/64  Pulse: (!) 38 68  Resp: 15 13  Temp:  99.3 F (37.4 C)  SpO2: 94% 99%   Physical Exam: Lungs:  Non labored Incisions:  none Extremities:  Groin cath site soft without palpable hematoma; dressing left in place over lesions of legs; feet symmetrically warm Abdomen:  Soft Neurologic: A&O  CBC    Component Value Date/Time   WBC 7.0 09/01/2017 1735   RBC 3.66 (L) 09/01/2017 1735   HGB 10.3 (L) 09/01/2017 1735   HCT 31.9 (L) 09/01/2017 1735   PLT 233 09/01/2017 1735   MCV 87.2 09/01/2017 1735   MCH 28.1 09/01/2017 1735   MCHC 32.3 09/01/2017 1735   RDW 14.9 09/01/2017 1735   LYMPHSABS 1.0 10/25/2013 1255   MONOABS 0.4 10/25/2013 1255   EOSABS 0.1 10/25/2013 1255   BASOSABS 0.0 10/25/2013 1255    BMET    Component Value Date/Time   NA 136 09/01/2017 1735   K 4.6 09/01/2017 1735   CL 97 (L) 09/01/2017 1735   CO2 24 09/01/2017 1735   GLUCOSE 164 (H) 09/01/2017 1735   BUN 51 (H) 09/01/2017 1735   CREATININE 9.65 (H) 09/01/2017 1735   CALCIUM 9.3 09/01/2017 1735   GFRNONAA 3 (L) 09/01/2017 1735   GFRAA 4 (L) 09/01/2017 1735    INR    Component Value Date/Time   INR 1.01 08/31/2017 0630     Intake/Output Summary (Last 24 hours) at 09/02/2017 0837 Last data filed at 09/02/2017 0348 Gross per 24 hour  Intake 60 ml  Output 1056 ml  Net -996 ml     Assessment/Plan:  84 y.o. female is s/p antegrade cann L CFA, SFA OA+PTA, peroneal OA+PTA  2 Days Post-Op   Social work consulted to meet with family to discuss possible placement PT/OT to eval today Work on pain control today ESRD management per Nephrology D/c pending above evaluations  Dagoberto Ligas, PA-C [facility] [phone] 09/02/2017 8:37 AM  Addendum  I have  independently interviewed and examined the patient, and I agree with the physician assistant's findings.  Left foot unchanged.  Skin biopsies not processed for calcium, so waiting on call back from pathology.    - Plan as above - Pt may need some home health assistance with PT/OT per daughter - Pt refusing SNF placement at this point - She also refuses ALL AMPUTATIONS - D/C pending evaluation  Adele Barthel, MD, FACS [facility] [place] Office: [phone] Pager: [phone]  09/02/2017, 10:41 AM

## 2017-09-03 ENCOUNTER — Telehealth: Payer: Self-pay | Admitting: Vascular Surgery

## 2017-09-03 LAB — HEPATITIS B SURFACE ANTIGEN: Hepatitis B Surface Ag: NEGATIVE

## 2017-09-03 LAB — PROTIME-INR
INR: 1.08
Prothrombin Time: 14 seconds (ref 11.4–15.2)

## 2017-09-03 LAB — HEPATITIS B SURFACE ANTIBODY,QUALITATIVE: Hep B S Ab: REACTIVE

## 2017-09-03 MED ORDER — WARFARIN SODIUM 5 MG PO TABS
5.0000 mg | ORAL_TABLET | Freq: Once | ORAL | Status: AC
  Administered 2017-09-03: 5 mg via ORAL
  Filled 2017-09-03: qty 1

## 2017-09-03 NOTE — Progress Notes (Signed)
  Destiny Boyd Progress Note   Assessment/ Plan:   1 Critical LLE ischemia: s/p OR with DR Bridgett Larsson 1/14.  There is also a concern regarding calciphylaxis- skin biopsies showing ulcer and necrosis, need to process biopsies for Ca.  These are pending at the time of discharge.    2 ESRD: TTS schedule. HD today. Home unit is [facility]--> will communicate to home unit about following up biopsies.   3 Hypertension: no home antihypertensives listed on HD orders except for dilt.  4. Anemia of ESRD: will give Aranesp here with HD if needed, Last Hgb 11.0  5. Metabolic Bone Disease: on sensipar 30 mg daily.  Renvela as binder 4 tabs with meals and 2 with snacks  6.  Nutrition: Alb 3.3, nepro  7.  Afib: on dilt, warfarin restart per vasc surg.    8.  Dispo: d/c today   Subjective:    No complaints.  Wants to go home.     Objective:   BP (!) 97/57   Pulse (!) 32   Temp 98.8 F (37.1 C) (Oral)   Resp 15   Ht 5' 4.5" (1.638 m)   Wt 53.1 kg (117 lb 1 oz)   SpO2 90%   BMI 19.78 kg/m   Physical Exam: GEN elderly woman, NAD, lying in bed, awake today HEENT eyes closed NECK no JVD PULM clear bilaterally no c/w/r CV irregular transmitted murmur from fistula ABD nondistended nontender NABS EXT No LE edema, R medial malleolus with 2-3 cm ulcer and L shin bandaged NEURO follows commands and moves all extremities ACCESS: LUE AVF + T/B   Labs: BMET Recent Labs  Lab 08/31/17 0630 09/01/17 0350 09/01/17 1735  NA 139 138 136  K 3.8 4.3 4.6  CL 100* 100* 97*  CO2 23 23 24   GLUCOSE 136* 117* 164*  BUN 34* 45* 51*  CREATININE 7.78* 9.16* 9.65*  CALCIUM 9.9 9.3 9.3  PHOS  --   --  7.5*   CBC Recent Labs  Lab 08/31/17 0630 09/01/17 0350 09/01/17 1735  WBC 4.8 5.5 7.0  HGB 11.0* 10.3* 10.3*  HCT 35.2* 32.5* 31.9*  MCV 88.7 87.8 87.2  PLT 235 195 233    @IMGRELPRIORS @ Medications:    . aspirin EC  81 mg Oral Daily  . diltiazem  240 mg Oral Daily   And  . diltiazem  120 mg Oral QHS  . enoxaparin (LOVENOX) injection  60 mg Subcutaneous Q24H  . feeding supplement (NEPRO CARB STEADY)  237 mL Oral BID BM  . linagliptin  5 mg Oral Daily  . multivitamin  1 tablet Oral QHS  . pantoprazole  40 mg Oral Daily  . sevelamer carbonate  3,200 mg Oral TID WC  . traMADol  100 mg Oral QHS  . warfarin  5 mg Oral ONCE-1800  . Warfarin - Pharmacist Dosing Inpatient   Does not apply [place] pgr [phone] 09/03/2017, 1:23 PM

## 2017-09-03 NOTE — Progress Notes (Signed)
ANTICOAGULATION CONSULT NOTE - Follow Up Consult  Pharmacy Consult for Lovenox and Coumadin Indication: atrial fibrillation  No Known Allergies  Patient Measurements: Height: 5' 4.5" (163.8 cm) Weight: 119 lb (54 kg) IBW/kg (Calculated) : 55.85  Vital Signs: Temp: 98.9 F (37.2 C) (01/17 0456) Temp Source: Oral (01/17 0456) BP: 100/57 (01/17 0456) Pulse Rate: 79 (01/17 0456)  Labs: Recent Labs    09/01/17 0350 09/01/17 1735 09/03/17 0721  HGB 10.3* 10.3*  --   HCT 32.5* 31.9*  --   PLT 195 233  --   LABPROT  --   --  14.0  INR  --   --  1.08  CREATININE 9.16* 9.65*  --     Estimated Creatinine Clearance: 3.9 mL/min (A) (by C-G formula based on SCr of 9.65 mg/dL (H)).   Assessment: Destiny Boyd on coumadin for afib. Coumadin was being held and she was bridged outpatient with lovenox anticipating multiple vascular procedures. S/P LLE angiogram/calf biopsy on 1/14. Lovenox resumed 1/14 PM and coumadin resumed 1/16. INR 1.08 after first dose. For HD today.  Patient's daughter couldn't remember what her home dose was but verified that she had 2.5mg  tablets. Last dose per clinic visit 06/15/17 was 5mg  daily except 2.5mg  MWF with INR 3.5.  Goal of Therapy:  INR 2-3 Anti-Xa level 0.6-1 units/ml 4hrs after LMWH dose given Monitor platelets by anticoagulation protocol: Yes   Plan:  1) Continue lovenox 60mg  sq q24 2) Coumadin 5mg  tonight 3) Daily INR, CBC every 3 days  Deboraha Sprang 09/03/2017,8:40 AM

## 2017-09-03 NOTE — Telephone Encounter (Signed)
-----   Message from Mena Goes, RN sent at 09/03/2017  1:44 PM EST ----- Regarding: 2 weeks with [facility]    ----- Message ----- From: Iline Oven Sent: 09/03/2017   1:23 PM To: Vvs Charge Pool  Can you schedule an appt for this pt in about 2 weeks with Dr. Bridgett Larsson.  PO L SFA and peroneal atherectomy and skin biopsy.  Thanks, [facility]

## 2017-09-03 NOTE — Care Management Note (Signed)
Case Management Note Marvetta Gibbons RN, BSN Unit 4E-Case Manager [phone]  Patient Details  Name: Destiny Boyd MRN: [id number] Date of Birth: Aug 06, 1934  Subjective/Objective:   Pt admitted with PAD s/p antegrade cann L CFA, SFA OA+PTA, peroneal OA+PTA                  Action/Plan: PTA pt lived at home alone, has daughter nearby to assist. Spoke with pt and daughter at bedside- at this time requesting [facility] services are not interested in STSNF- pt goes to HD t/t/s- per daughter pt has used [facility] in past and this is who they would like to use again for services- pt has needed DME- daughter to look into a shower chair- also asked about medicaid- which info was given on process to apply. Per nutrition daughter asking for prescription for nepro - MD would have to provide this at discharge or she can f/u with PCP. CM will f/u for [facility] orders prior to discharge and make referral to [facility].   Expected Discharge Date:  09/01/17               Expected Discharge Plan:  Home/Self Care  In-House Referral:  NA  Discharge planning Services  CM Consult  Post Acute Care Choice:  Home Health Choice offered to:  Patient, Adult Children  DME Arranged:    DME Agency:     HH Arranged:  RN, PT [place] Agency:  [place]  Status of Service:  Completed, signed off  If discussed at [place] of Stay Meetings, dates discussed:    Discharge Disposition: home/home health   Additional Comments:  09/03/17- 40 - Cass Vandermeulen RN, CM- pt for d/c home today after HD tx- orders have been placed for HHRN/PT- call made to Butch Penny regarding referral needs for [facility] services- referral accepted for HHRN/PT- pt to return home with daughter.  Dawayne Patricia, RN 09/03/2017, 12:27 PM

## 2017-09-03 NOTE — Telephone Encounter (Signed)
Sched appt 09/23/17 at 8:45. Spoke to pt's daughter.

## 2017-09-03 NOTE — Progress Notes (Addendum)
  Progress Note    09/03/2017 7:39 AM 3 Days Post-Op  Subjective:  Patient without complaints, sitting on the side of the bed and eating breakfast.  Daughter not present this morning   Vitals:   09/02/17 2315 09/03/17 0456  BP: (!) 87/64 (!) 100/57  Pulse: 85 79  Resp: (!) 22 14  Temp: 99.1 F (37.3 C) 98.9 F (37.2 C)  SpO2: 94% 94%   Physical Exam: Lungs:  Non labored Extremities:  R medial malleolus lesion stable; L shin lesions also stable, no drainage; feet are symmetrically warm to touch; no palpable hematoma at groin cath site Abdomen:  Soft Neurologic: A&O  CBC    Component Value Date/Time   WBC 7.0 09/01/2017 1735   RBC 3.66 (L) 09/01/2017 1735   HGB 10.3 (L) 09/01/2017 1735   HCT 31.9 (L) 09/01/2017 1735   PLT 233 09/01/2017 1735   MCV 87.2 09/01/2017 1735   MCH 28.1 09/01/2017 1735   MCHC 32.3 09/01/2017 1735   RDW 14.9 09/01/2017 1735   LYMPHSABS 1.0 10/25/2013 1255   MONOABS 0.4 10/25/2013 1255   EOSABS 0.1 10/25/2013 1255   BASOSABS 0.0 10/25/2013 1255    BMET    Component Value Date/Time   NA 136 09/01/2017 1735   K 4.6 09/01/2017 1735   CL 97 (L) 09/01/2017 1735   CO2 24 09/01/2017 1735   GLUCOSE 164 (H) 09/01/2017 1735   BUN 51 (H) 09/01/2017 1735   CREATININE 9.65 (H) 09/01/2017 1735   CALCIUM 9.3 09/01/2017 1735   GFRNONAA 3 (L) 09/01/2017 1735   GFRAA 4 (L) 09/01/2017 1735    INR    Component Value Date/Time   INR 1.01 08/31/2017 0630    No intake or output data in the 24 hours ending 09/03/17 0739   Assessment/Plan:  84 y.o. female is s/p antegrade cann L CFA, SFA OA+PTA, peroneal OA+PTA   3 Days Post-Op   Plan is for HD treatment this morning Lesions of BLE stable Will d/c to SNF if approved by social work after dialysis treatment   Dagoberto Ligas, PA-C [facility] [phone] 09/03/2017 7:39 AM  Addendum  I have independently interviewed and examined the patient, and I agree with the  physician assistant's findings.  Still haven't been able to contact Pathologist for the case.   Adele Barthel, MD, FACS [facility] [place] Office: [phone] Pager: [phone]  09/03/2017, 8:36 AM

## 2017-09-04 ENCOUNTER — Telehealth: Payer: Self-pay | Admitting: *Deleted

## 2017-09-04 ENCOUNTER — Other Ambulatory Visit: Payer: Self-pay

## 2017-09-04 DIAGNOSIS — Z992 Dependence on renal dialysis: Secondary | ICD-10-CM | POA: Diagnosis not present

## 2017-09-04 DIAGNOSIS — N186 End stage renal disease: Secondary | ICD-10-CM | POA: Diagnosis not present

## 2017-09-04 DIAGNOSIS — Z5181 Encounter for therapeutic drug level monitoring: Secondary | ICD-10-CM | POA: Diagnosis not present

## 2017-09-04 DIAGNOSIS — L97211 Non-pressure chronic ulcer of right calf limited to breakdown of skin: Secondary | ICD-10-CM | POA: Diagnosis not present

## 2017-09-04 DIAGNOSIS — Z7984 Long term (current) use of oral hypoglycemic drugs: Secondary | ICD-10-CM | POA: Diagnosis not present

## 2017-09-04 DIAGNOSIS — M109 Gout, unspecified: Secondary | ICD-10-CM | POA: Diagnosis not present

## 2017-09-04 DIAGNOSIS — E7800 Pure hypercholesterolemia, unspecified: Secondary | ICD-10-CM | POA: Diagnosis not present

## 2017-09-04 DIAGNOSIS — Z7982 Long term (current) use of aspirin: Secondary | ICD-10-CM | POA: Diagnosis not present

## 2017-09-04 DIAGNOSIS — I70248 Atherosclerosis of native arteries of left leg with ulceration of other part of lower left leg: Secondary | ICD-10-CM | POA: Diagnosis not present

## 2017-09-04 DIAGNOSIS — D631 Anemia in chronic kidney disease: Secondary | ICD-10-CM | POA: Diagnosis not present

## 2017-09-04 DIAGNOSIS — Z48812 Encounter for surgical aftercare following surgery on the circulatory system: Secondary | ICD-10-CM | POA: Diagnosis not present

## 2017-09-04 DIAGNOSIS — E1122 Type 2 diabetes mellitus with diabetic chronic kidney disease: Secondary | ICD-10-CM | POA: Diagnosis not present

## 2017-09-04 DIAGNOSIS — Z8673 Personal history of transient ischemic attack (TIA), and cerebral infarction without residual deficits: Secondary | ICD-10-CM | POA: Diagnosis not present

## 2017-09-04 DIAGNOSIS — Z87891 Personal history of nicotine dependence: Secondary | ICD-10-CM | POA: Diagnosis not present

## 2017-09-04 DIAGNOSIS — Z9862 Peripheral vascular angioplasty status: Secondary | ICD-10-CM | POA: Diagnosis not present

## 2017-09-04 DIAGNOSIS — I70232 Atherosclerosis of native arteries of right leg with ulceration of calf: Secondary | ICD-10-CM | POA: Diagnosis not present

## 2017-09-04 DIAGNOSIS — I480 Paroxysmal atrial fibrillation: Secondary | ICD-10-CM | POA: Diagnosis not present

## 2017-09-04 DIAGNOSIS — I120 Hypertensive chronic kidney disease with stage 5 chronic kidney disease or end stage renal disease: Secondary | ICD-10-CM | POA: Diagnosis not present

## 2017-09-04 DIAGNOSIS — Z7901 Long term (current) use of anticoagulants: Secondary | ICD-10-CM | POA: Diagnosis not present

## 2017-09-04 DIAGNOSIS — Z85038 Personal history of other malignant neoplasm of large intestine: Secondary | ICD-10-CM | POA: Diagnosis not present

## 2017-09-04 DIAGNOSIS — E1151 Type 2 diabetes mellitus with diabetic peripheral angiopathy without gangrene: Secondary | ICD-10-CM | POA: Diagnosis not present

## 2017-09-04 DIAGNOSIS — L97821 Non-pressure chronic ulcer of other part of left lower leg limited to breakdown of skin: Secondary | ICD-10-CM | POA: Diagnosis not present

## 2017-09-04 NOTE — Telephone Encounter (Signed)
Destiny Boyd [facility]/ [facility] called wanting an order to check the pt's coumadin at home since the pt missed her apt w/ [facility] on 09/02/17.  Please give Destiny Boyd a call @ [phone], she will be with the pt for about an hour.

## 2017-09-04 NOTE — Telephone Encounter (Signed)
Returned call to [place] who had unfortunately already left the patient's home. She states she will be able to go check INR on Monday for the patient. Gave her a verbal order to check on Monday and call results to Lisa's office.

## 2017-09-04 NOTE — Patient Outreach (Addendum)
[facility]) Care Management  09/04/2017  Destiny Boyd 09-29-1933 [id number]   Transition of care- call #1   Cm received a referral from [place] on 09/02/17 stating   Please assign to [facility] RN CM for transition of care.  Please assign to [facility] Pharmacist for medication affordability.  Written consent obtained.  Daughter Destiny Boyd (daughter) [phone] to be contacted for transition of care calls. Lives alone. Goes to HD on Tuesdays, Thursdays, Saturdays. History of colon cancer, ESRD, HTN, PAF, DM, CVA anemia . Likely discharge today. Please call with questions.  Further past medical hx of  extensive calcification of entire arterial system.  She underwent OA+PTA distal L SFA and OTA+PTA L peroneal artery   Admission dx was peripheral vascular disease with ulcers bilateral lower extremities for left critical limb ischemia with possible calciphylaxis after an outpatient office visit to Dr Bridgett Boyd for LLE arteriogram with SFA and peroneal atherectomy and angioplasty of peroneal as well as skin biopsy x 2.    She has had only one admission in the last 6 months  Destiny Boyd was discharged on 09/03/16 per her daughter, Destiny Boyd confirms Destiny Boyd has medicare "but not medicaid."  Destiny Boyd reports completing 3 applications but unaware of denial letters or reasons.  Confirms Destiny Boyd has been going to dialysis for "about two years"   Upcoming appointments  An appointment with Dr Legrand Rams has not been made. Destiny Boyd reports Dr Legrand Rams generally sees Destiny Boyd q 6 months.  Cm encouraged Destiny Boyd to call and make a hospital follow up appointment with Dr Legrand Rams after discussing the purpose for the Primary care provider to see a patient after a hospitalization She voice understanding and was provided with Dr Legrand Rams office number  Upcoming appointment with Dr Bridgett Boyd, vascular surgeon, confirmed on 09/23/17 at [place] for hospital follow up  Medications Destiny Boyd reports the  patient has all of the discharge medications at her home. Destiny Boyd fills Destiny Boyd's pill containers weekly  Cm discussed Power consult ordered and that Destiny Boyd should get a call from [place] so she could expect the call.  Destiny Boyd reports issue with renal medications during the year  Renvela, tradjenta, Lovenox   Voiced Concern Destiny Boyd voiced concern that prior to leaving the hospital they were not offered Glucerna or coupons for Glucerna.  Coupons need for Glucerna related to not being able to afford   Activity as tolerate ordered at d/c but [facility] Destiny Boyd is not able to walk at this time but has access to a walker at [facility] home   Wound care to on bilateral leg is being completed by [facility], Destiny Boyd reports [name] visited 09/04/17 but she has not heard from the [place] at this time.  Cm discussed if HHPT does not contact her by 09/07/17 she needs to contact [facility] office   Pain has been to level 10   Flu shot obtained at [place] office but she is not aware if the pneumonia shot was given. Discussed the pneumonia shot and encouraged to ask Dr Legrand Rams office about it  Diet Regular diet ordered per discharging doctor, Dr Bridgett Boyd but Cm discussed a Heart healthy low sodium diabetic diet considering the patient medical hx of DM, CKD and cardiac hx.  Discussed no salt, fat and monitored carbohydrate diet "Nothing white" confirmed by [employer] The daughter provides transportation at this time related to the daughter not working. The daughter is able to  take the patient to dialysis.    DM Destiny Boyd reports Destiny Boyd did not have a glucometer but [facility] order one for her .  CM discussed that Dr Legrand Rams should be consulted for strips prn CM discussed also discounted glucometer and strips when Destiny Boyd discussed the cost of test strips. -relion brand name Destiny Boyd Destiny Boyd has been dx for more than 15 yrs   Destiny Boyd denies further issues, concerns, or  problems today. CM confirmed that she hasmy direct phone number, the main [facility] CM office phone number, [facility] and the [facility] CM 24-hour nurse advice phone number should issues arise prior to next scheduled [place] outreach      Plan diet Glucerna pcp f/u action plans  Plans home visit scheduled for next week Successful out reach letters sent to patient and Dr Renato Battles L. Lavina Hamman, RN, BSN, [place] Care Management [phone]

## 2017-09-05 DIAGNOSIS — D631 Anemia in chronic kidney disease: Secondary | ICD-10-CM | POA: Diagnosis not present

## 2017-09-05 DIAGNOSIS — D509 Iron deficiency anemia, unspecified: Secondary | ICD-10-CM | POA: Diagnosis not present

## 2017-09-05 DIAGNOSIS — N2581 Secondary hyperparathyroidism of renal origin: Secondary | ICD-10-CM | POA: Diagnosis not present

## 2017-09-05 DIAGNOSIS — Z992 Dependence on renal dialysis: Secondary | ICD-10-CM | POA: Diagnosis not present

## 2017-09-05 DIAGNOSIS — N186 End stage renal disease: Secondary | ICD-10-CM | POA: Diagnosis not present

## 2017-09-07 ENCOUNTER — Other Ambulatory Visit: Payer: Self-pay | Admitting: Pharmacist

## 2017-09-07 ENCOUNTER — Ambulatory Visit (INDEPENDENT_AMBULATORY_CARE_PROVIDER_SITE_OTHER): Payer: Medicare Other | Admitting: *Deleted

## 2017-09-07 DIAGNOSIS — I4891 Unspecified atrial fibrillation: Secondary | ICD-10-CM

## 2017-09-07 DIAGNOSIS — E1151 Type 2 diabetes mellitus with diabetic peripheral angiopathy without gangrene: Secondary | ICD-10-CM | POA: Diagnosis not present

## 2017-09-07 DIAGNOSIS — Z48812 Encounter for surgical aftercare following surgery on the circulatory system: Secondary | ICD-10-CM | POA: Diagnosis not present

## 2017-09-07 DIAGNOSIS — L97821 Non-pressure chronic ulcer of other part of left lower leg limited to breakdown of skin: Secondary | ICD-10-CM | POA: Diagnosis not present

## 2017-09-07 DIAGNOSIS — I70248 Atherosclerosis of native arteries of left leg with ulceration of other part of lower left leg: Secondary | ICD-10-CM | POA: Diagnosis not present

## 2017-09-07 DIAGNOSIS — L97211 Non-pressure chronic ulcer of right calf limited to breakdown of skin: Secondary | ICD-10-CM | POA: Diagnosis not present

## 2017-09-07 DIAGNOSIS — Z7901 Long term (current) use of anticoagulants: Secondary | ICD-10-CM

## 2017-09-07 DIAGNOSIS — I70232 Atherosclerosis of native arteries of right leg with ulceration of calf: Secondary | ICD-10-CM | POA: Diagnosis not present

## 2017-09-07 LAB — POCT INR: INR: 2.7

## 2017-09-07 NOTE — Patient Instructions (Signed)
Decrease coumadin to 2 tablets daily except 1 tablet on Mondays, Wednesdays and Fridays Was d/c from hospital on 5mg  daily (2 tabs) Recheck in 1 week Order given to [place]

## 2017-09-07 NOTE — Patient Outreach (Signed)
[facility]) Care Management  09/07/2017  Destiny Boyd [birth year] [id number]  Patient was referred to [place] Pharmacist by [place] for medication affordability concerns.  Noted per referral from [place], point of contact for patient is daughter, Destiny Boyd.    Successful phone outreach to patient's daughter, Destiny Boyd.  Introduced self and before HIPAA details could be verified, [name] requested a call back tomorrow afternoon or Wednesday due to being out of town.   Plan:  Will make outreach attempt to [facility]  Later per her request.    Karrie Meres, PharmD, [place] [phone]

## 2017-09-08 ENCOUNTER — Other Ambulatory Visit: Payer: Self-pay | Admitting: *Deleted

## 2017-09-08 DIAGNOSIS — D631 Anemia in chronic kidney disease: Secondary | ICD-10-CM | POA: Diagnosis not present

## 2017-09-08 DIAGNOSIS — E119 Type 2 diabetes mellitus without complications: Secondary | ICD-10-CM | POA: Diagnosis not present

## 2017-09-08 DIAGNOSIS — N186 End stage renal disease: Secondary | ICD-10-CM | POA: Diagnosis not present

## 2017-09-08 DIAGNOSIS — D509 Iron deficiency anemia, unspecified: Secondary | ICD-10-CM | POA: Diagnosis not present

## 2017-09-08 DIAGNOSIS — N2581 Secondary hyperparathyroidism of renal origin: Secondary | ICD-10-CM | POA: Diagnosis not present

## 2017-09-08 DIAGNOSIS — Z992 Dependence on renal dialysis: Secondary | ICD-10-CM | POA: Diagnosis not present

## 2017-09-08 DIAGNOSIS — Z1159 Encounter for screening for other viral diseases: Secondary | ICD-10-CM | POA: Diagnosis not present

## 2017-09-08 NOTE — Patient Outreach (Signed)
[facility]) Care Management   09/17/2017  Jacquelinne ARAH ARO 04-09-1934 [id number]  Stacia BROOKE STEINHILBER is an 84 y.o. female with a PMH of hypertension, hyperlipidemia, Diabetes type 2, ESRD  On Hemodialysis (Tuesdays,Thursdays, Saturdays), Paroxysmal atrial fibrillation, stroke, gout, GERD, colon cancer (cecum) Peripheral vascular disease, extensive calcification of entire arterial system. She underwent OA+PTA distal L SFA and OTA+PTA L peroneal artery, iron deficiency anemia, abdominal hysterectomy and multiple teeth extractions  Mrs Odom was referred to [place] on 09/02/17 and contacted on 09/04/17 to initiate the referred transition of care services assessment.  She has an apartment but is now staying with her daughter, Adline Potter.  She was also referred to [place] staff.   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  Mrs Molzahn was discharged on 09/03/16 as confirmed by her Daughter, [name].  She has had only one admission in the last 6 months   This is the initial home visit for Mrs Knudtson   Subjective: see notes below   Objective:   BP 120/78   Pulse 89   Temp (!) 97 F (36.1 C) (Oral)   Resp 18   Wt 122 lb (55.3 kg)   SpO2 95%   BMI 20.62 kg/m  Review of Systems  Constitutional: Positive for weight loss. Negative for chills, diaphoresis, fever and malaise/fatigue.  HENT: Negative.  Negative for congestion, ear discharge, ear pain, hearing loss, nosebleeds, sinus pain, sore throat and tinnitus.   Eyes: Negative.  Negative for blurred vision, double vision, photophobia and pain.  Respiratory: Negative.  Negative for cough, hemoptysis, sputum production, shortness of  breath, wheezing and stridor.   Cardiovascular: Positive for leg swelling. Negative for chest pain, palpitations, orthopnea and claudication.  Gastrointestinal: Positive for constipation.  Genitourinary: Negative.   Musculoskeletal: Positive for joint pain.  Skin: Negative.  Negative for itching and rash.  Neurological: Positive for weakness. Negative for tremors and speech change.  Endo/Heme/Allergies: Bruises/bleeds easily.    Physical Exam  Encounter Medications:   No facility-administered encounter medications on file as of 09/08/2017.    Outpatient Encounter Medications as of 09/08/2017  Medication Sig Note  . multivitamin (RENA-VIT) TABS tablet Take 1 tablet by mouth daily.   Marland Kitchen oxyCODONE-acetaminophen (PERCOCET/ROXICET) 5-325 MG tablet Take 1 tablet by mouth every 6 (six) hours as needed for moderate pain.   . pantoprazole (PROTONIX) 40 MG tablet Take 1 tablet (40 mg total) by mouth daily.   Marland Kitchen aspirin EC 81 MG tablet Take 81 mg by mouth daily.   Marland Kitchen diltiazem (CARDIZEM CD) 120 MG 24 hr capsule Take 240 mg ( 2 tablets ) in th am, and take 120 mg ( 1 tablet)  in the pm (Patient taking differently: Take 120-240 mg by mouth See admin instructions. Take 240 mg ( 2 tablets ) in th am, and take 120 mg ( 1 tablet)  in the pm)   . sevelamer carbonate (RENVELA) 800 MG tablet Take 3,200 mg by mouth 3 (three) times daily with meals. & 1600 by mouth with snacks   . TRADJENTA 5 MG TABS tablet Take 1 tablet by mouth daily.   . traMADol (ULTRAM) 50 MG tablet Take 100 mg by mouth at bedtime.    Marland Kitchen warfarin (COUMADIN)  2.5 MG tablet Take 2.5-5 mg by mouth. Per 09/16/17 warfarin note in chart:  2.5 mg Monday, Wednesday and Friday, and 5 mg Tuesday, Thursday, Saturday and sunday   . [DISCONTINUED] enoxaparin (LOVENOX) 60 MG/0.6ML injection Inject 0.6 mLs (60 mg total) into the skin daily for 10 days. 08/25/2017: FOR PROCEDURE ONLY    Functional Status:   In your present state of health, do you have any  difficulty performing the following activities: 09/16/2017 09/08/2017  Hearing? N N  Vision? N N  Difficulty concentrating or making decisions? Tempie Donning  Walking or climbing stairs? Y Y  Dressing or bathing? Y Y  Doing errands, shopping? Tempie Donning  Preparing Food and eating ? - Y  Using the Toilet? - N  In the past six months, have you accidently leaked urine? - N  Do you have problems with loss of bowel control? - N  Managing your Medications? - Y  Managing your Finances? - Y  Housekeeping or managing your Housekeeping? - Y  Some recent data might be hidden    Fall/Depression Screening:    No flowsheet data found. No flowsheet data found.  Assessment:   Cm met with Mrs Giddens and her daughter at her daughter's home (were she is temporarily staying) in the living room.  Mrs Nearhood is sitting in a high back chair watching tv. She reports completing dialysis earlier today without voiced concerns.  Reports she is not tired, is very alert and talkative during the home visit. Re reviewed transition of care services information  Diet Mrs Troop shared with CM what she generally eats She reports preferring to eat oatmeal, grits apple juice, potatoes.  CM discussed carbohydrate intake related to Diabetes. CM discussed zero calorie, no sugar fluids available to assist with hydration. Pictures shown to daughter of these products since she is shopping for Mrs Gillyard now.  Diabetes (DM)-  Mrs Marohl's daughter asked if Cm would take Mrs Kirst's cbg during the home visits.  Her daughter reports during the DM assessment that Mrs Correnti did not have a glucose meter at her home but possibly at Mrs Meineke's apartment.  They report that they have been told by the home health RN that a glucose meter will be obtained for her.   The cbg was  213 Her daughter inquired about the intervention for this 213 cbg value Cm encouraged hydration with water but she was given diluted apple juice.  Cm discussed the  sugar content in apple juice  Home health services- They report that the home health RN has already visited but pending HHPT services    Medications Cm was informed that they were called on Monday 09/07/17 by [facility] pharmacy staff . CM discussed the CM pharmacy referral purpose and encouraged participation with the pharmacist Mrs Beggs reports Renvela causes her constipation - She is not on a stool softener- Cm discussed the use of a stool softener like colace,  warm tea, warm coffee or a food that naturally stimulates Mrs Bamburg's bowel elimination needs to be added to her daily routine prn They voiced understanding   DME - Denies need of further DME- reports gauze and tape came in already and Rolator. Her daughter states patient needs "numbing cream"  CM discussed available over the counter numbing creams like salonpas at [employer] or dicussed that she could request this cream from [facility] if it is being used during dialysis  Upcoming appointments F/u pcp appt dr Legrand Rams - Friday 09/11/17 at  1000 am and daughter will transport her   Weight Mrs Brunker reports her weight to be 122 lbs lately and reports she previously has been 138 lbs   Plan: to follow up with Mrs Quizon in 2 weeks for home visit as agreed  [facility] with [facility] at [facility] to discuss plans for further home health services arrival  Mrs Godown and her daughter were given $3 Glucerna coupons and ensure coupons as discussed in previous contact call Route this note to listed Epic care team member  [facility] CM Care Plan Problem One     Most Recent Value  Care Plan Problem One  Risk for hospital readmission secondary to [place] of CKD, PAD, DM  Role Documenting the Problem One  Care Management Coordinator  Care Plan for Problem One  Active  [facility] Long Term Goal   over the next 31 days patiente will not experience hospital re admission as evidence by patient reporting and review of EMR during Cm outreach  [place]  Term Goal Start Date  09/08/17  Interventions for Problem One Long Term Goal   Discussed with patient current clinical status, upcoming provider appointments,  medication reconciliation completed.  [facility] CCM TOC program initiated  [facility] CM Short Term Goal #1   Over the next 7 days, patient will schedule hospital follow up office visit appointments with PCP, as evidenced by patient reporting during [facility] RN CCM outreach  [facility] CM Short Term Goal #1 Start Date  09/08/17  Interventions for Short Term Goal #1  Discussed with patient his plans to schedule hospital follow up appointments with cardiologist and PCP,  encouraged patient to schedule appointments promptly       Kimberly L. Lavina Hamman, RN, BSN, [place] Care Management [phone]

## 2017-09-09 ENCOUNTER — Other Ambulatory Visit: Payer: Self-pay | Admitting: Pharmacist

## 2017-09-09 DIAGNOSIS — I70248 Atherosclerosis of native arteries of left leg with ulceration of other part of lower left leg: Secondary | ICD-10-CM | POA: Diagnosis not present

## 2017-09-09 DIAGNOSIS — L97821 Non-pressure chronic ulcer of other part of left lower leg limited to breakdown of skin: Secondary | ICD-10-CM | POA: Diagnosis not present

## 2017-09-09 DIAGNOSIS — Z48812 Encounter for surgical aftercare following surgery on the circulatory system: Secondary | ICD-10-CM | POA: Diagnosis not present

## 2017-09-09 DIAGNOSIS — L97211 Non-pressure chronic ulcer of right calf limited to breakdown of skin: Secondary | ICD-10-CM | POA: Diagnosis not present

## 2017-09-09 DIAGNOSIS — E1151 Type 2 diabetes mellitus with diabetic peripheral angiopathy without gangrene: Secondary | ICD-10-CM | POA: Diagnosis not present

## 2017-09-09 DIAGNOSIS — I70232 Atherosclerosis of native arteries of right leg with ulceration of calf: Secondary | ICD-10-CM | POA: Diagnosis not present

## 2017-09-09 NOTE — Patient Outreach (Signed)
[facility]) Care Management  09/09/2017  Destiny Boyd 04/10/1934 [id number]  Unsuccessful phone outreach attempt to patient's daughter, [name], listed as point of contact on referral from [facility] Liaison and on [facility] Consent form.  Unable to leave message as voicemail gave message mail box was full.  Plan:  Second outreach attempt in the next week.   Karrie Meres, PharmD, [place] [phone]

## 2017-09-10 ENCOUNTER — Other Ambulatory Visit: Payer: Self-pay | Admitting: Pharmacist

## 2017-09-10 DIAGNOSIS — N186 End stage renal disease: Secondary | ICD-10-CM | POA: Diagnosis not present

## 2017-09-10 DIAGNOSIS — D509 Iron deficiency anemia, unspecified: Secondary | ICD-10-CM | POA: Diagnosis not present

## 2017-09-10 DIAGNOSIS — D631 Anemia in chronic kidney disease: Secondary | ICD-10-CM | POA: Diagnosis not present

## 2017-09-10 DIAGNOSIS — Z992 Dependence on renal dialysis: Secondary | ICD-10-CM | POA: Diagnosis not present

## 2017-09-10 DIAGNOSIS — N2581 Secondary hyperparathyroidism of renal origin: Secondary | ICD-10-CM | POA: Diagnosis not present

## 2017-09-10 NOTE — Patient Outreach (Signed)
[facility]) Care Management  09/10/2017  Ival CAMALA TALWAR 1934-06-07 [id number]  Unsuccessful phone outreach to patient's daughter, [name], listed on [facility] Consent form.  No answer, and unable to leave message as voicemail was full.   Plan:  Will make third phone outreach attempt next week if no return call.   Karrie Meres, PharmD, [place] [phone]

## 2017-09-11 DIAGNOSIS — Z48812 Encounter for surgical aftercare following surgery on the circulatory system: Secondary | ICD-10-CM | POA: Diagnosis not present

## 2017-09-11 DIAGNOSIS — N186 End stage renal disease: Secondary | ICD-10-CM | POA: Diagnosis not present

## 2017-09-11 DIAGNOSIS — L97211 Non-pressure chronic ulcer of right calf limited to breakdown of skin: Secondary | ICD-10-CM | POA: Diagnosis not present

## 2017-09-11 DIAGNOSIS — E1165 Type 2 diabetes mellitus with hyperglycemia: Secondary | ICD-10-CM | POA: Diagnosis not present

## 2017-09-11 DIAGNOSIS — L97821 Non-pressure chronic ulcer of other part of left lower leg limited to breakdown of skin: Secondary | ICD-10-CM | POA: Diagnosis not present

## 2017-09-11 DIAGNOSIS — E1151 Type 2 diabetes mellitus with diabetic peripheral angiopathy without gangrene: Secondary | ICD-10-CM | POA: Diagnosis not present

## 2017-09-11 DIAGNOSIS — I70248 Atherosclerosis of native arteries of left leg with ulceration of other part of lower left leg: Secondary | ICD-10-CM | POA: Diagnosis not present

## 2017-09-11 DIAGNOSIS — I70232 Atherosclerosis of native arteries of right leg with ulceration of calf: Secondary | ICD-10-CM | POA: Diagnosis not present

## 2017-09-11 DIAGNOSIS — I10 Essential (primary) hypertension: Secondary | ICD-10-CM | POA: Diagnosis not present

## 2017-09-12 DIAGNOSIS — D631 Anemia in chronic kidney disease: Secondary | ICD-10-CM | POA: Diagnosis not present

## 2017-09-12 DIAGNOSIS — N2581 Secondary hyperparathyroidism of renal origin: Secondary | ICD-10-CM | POA: Diagnosis not present

## 2017-09-12 DIAGNOSIS — D509 Iron deficiency anemia, unspecified: Secondary | ICD-10-CM | POA: Diagnosis not present

## 2017-09-12 DIAGNOSIS — N186 End stage renal disease: Secondary | ICD-10-CM | POA: Diagnosis not present

## 2017-09-12 DIAGNOSIS — Z992 Dependence on renal dialysis: Secondary | ICD-10-CM | POA: Diagnosis not present

## 2017-09-14 ENCOUNTER — Ambulatory Visit (INDEPENDENT_AMBULATORY_CARE_PROVIDER_SITE_OTHER): Payer: Medicare Other | Admitting: *Deleted

## 2017-09-14 ENCOUNTER — Other Ambulatory Visit: Payer: Self-pay | Admitting: Pharmacist

## 2017-09-14 DIAGNOSIS — Z48812 Encounter for surgical aftercare following surgery on the circulatory system: Secondary | ICD-10-CM | POA: Diagnosis not present

## 2017-09-14 DIAGNOSIS — L97211 Non-pressure chronic ulcer of right calf limited to breakdown of skin: Secondary | ICD-10-CM | POA: Diagnosis not present

## 2017-09-14 DIAGNOSIS — I70232 Atherosclerosis of native arteries of right leg with ulceration of calf: Secondary | ICD-10-CM | POA: Diagnosis not present

## 2017-09-14 DIAGNOSIS — I70248 Atherosclerosis of native arteries of left leg with ulceration of other part of lower left leg: Secondary | ICD-10-CM | POA: Diagnosis not present

## 2017-09-14 DIAGNOSIS — Z7901 Long term (current) use of anticoagulants: Secondary | ICD-10-CM

## 2017-09-14 DIAGNOSIS — L97821 Non-pressure chronic ulcer of other part of left lower leg limited to breakdown of skin: Secondary | ICD-10-CM | POA: Diagnosis not present

## 2017-09-14 DIAGNOSIS — I4891 Unspecified atrial fibrillation: Secondary | ICD-10-CM | POA: Diagnosis not present

## 2017-09-14 DIAGNOSIS — E1151 Type 2 diabetes mellitus with diabetic peripheral angiopathy without gangrene: Secondary | ICD-10-CM | POA: Diagnosis not present

## 2017-09-14 LAB — POCT INR: INR: 4

## 2017-09-14 NOTE — Patient Instructions (Signed)
Hold coumadin tonight then decrease dose to 2.5mg  daily except 5mg  on Wednesdays and Saturdays Recheck in 1 week Order given to [place]

## 2017-09-14 NOTE — Patient Outreach (Signed)
[facility]) Care Management  [place]   09/14/2017  Destiny Boyd Mar 13, 1934 [id number]  Subjective:  Third phone outreach to patient's daughter, Destiny Boyd, on [facility] Consent Form.   HIPAA details verified and purpose of call explained to patient's daughter.  [name] reports she is able to review patient's medication over the phone.  She reports cost concern is related to cost of Glucerna.   Patient has a past medical history significant for:  Hypertension, atrial fibrillation, type 2 diabetes mellitus, end stage renal disease on dialysis.    Objective:   Current Medications: Current Outpatient Medications  Medication Sig Dispense Refill  . aspirin EC 81 MG tablet Take 81 mg by mouth daily.    Marland Kitchen diltiazem (CARDIZEM CD) 120 MG 24 hr capsule Take 240 mg ( 2 tablets ) in th am, and take 120 mg ( 1 tablet)  in the pm (Patient taking differently: Take 120-240 mg by mouth See admin instructions. Take 240 mg ( 2 tablets ) in th am, and take 120 mg ( 1 tablet)  in the pm) 90 capsule 3  . enoxaparin (LOVENOX) 60 MG/0.6ML injection Inject 0.6 mLs (60 mg total) into the skin daily for 10 days. 6 mL 0  . multivitamin (RENA-VIT) TABS tablet Take 1 tablet by mouth daily.    Marland Kitchen oxyCODONE-acetaminophen (PERCOCET/ROXICET) 5-325 MG tablet Take 1 tablet by mouth every 6 (six) hours as needed for moderate pain. 10 tablet 0  . pantoprazole (PROTONIX) 40 MG tablet Take 1 tablet (40 mg total) by mouth daily. 30 tablet 3  . sevelamer carbonate (RENVELA) 800 MG tablet Take 3,200 mg by mouth 3 (three) times daily with meals. & 1600 by mouth with snacks    . TRADJENTA 5 MG TABS tablet Take 1 tablet by mouth daily.    . traMADol (ULTRAM) 50 MG tablet Take 100 mg by mouth at bedtime.     Marland Kitchen warfarin (COUMADIN) 5 MG tablet Take 1 tablet (5 mg total) by mouth one time only at 6 PM.     No current facility-administered medications for this visit.     Functional Status: In your present state of  health, do you have any difficulty performing the following activities: 09/01/2017 08/31/2017  Hearing? - N  Vision? - N  Difficulty concentrating or making decisions? - N  Walking or climbing stairs? - Y  Dressing or bathing? - N  Doing errands, shopping? Y -  Some recent data might be hidden    Fall/Depression Screening: No flowsheet data found. No flowsheet data found.  Assessment:  Medication review per medication list in this chart and report by patient's daughter.    Drugs sorted by system:  Cardiovascular: -aspirin 81 mg  -diltiazem CD -warfarin---managed by cardiology, last INR 09/14/17 per chart review   Gastrointestinal: -pantoprazole   Endocrine: -linagliptin (Tradjenta)   Renal: -renal vitamin  -sevelamer (Renvela)   Pain: -oxycodone/acetaminophen as needed -tramadol   Duplications in therapy:  -patient using tramadol and as needed oxycodone/acetaminophen---increased risk of adverse drug events    Other issues noted:  -daughter reported patient experienced some constipation while taking narcotics---she reports this has resolved---counseled to discuss with provider if it resumes as bowel regimen may be necessary if narcotics continue.    -Enoxaparin was on active medication list---patient's daughter reports patient no longer using---it was removed from active medication list.    Patient assistance:  Glucerna---[employer] nutrition patient assistance program requires 100% of caloric needs come from product requested---daughter  reports this is not the case for patient.    Appears patient has Radiographer, therapeutic Help---discussed patient may be able to obtain 90 day supply of maintenance medications for same cost of 30 days---patient would need prescriptions written for 90 days.    Plan:  As patient's daughter denies other pharmacy related needs, will close pharmacy episode.   Patient's daughter provided with [facility] Pharmacist phone number  if new needs arise.   Note routed to patient's PCP.   [facility] RN Maudie Mercury updated.    Karrie Meres, PharmD, [place] [phone]

## 2017-09-15 DIAGNOSIS — D631 Anemia in chronic kidney disease: Secondary | ICD-10-CM | POA: Diagnosis not present

## 2017-09-15 DIAGNOSIS — D509 Iron deficiency anemia, unspecified: Secondary | ICD-10-CM | POA: Diagnosis not present

## 2017-09-15 DIAGNOSIS — Z992 Dependence on renal dialysis: Secondary | ICD-10-CM | POA: Diagnosis not present

## 2017-09-15 DIAGNOSIS — N186 End stage renal disease: Secondary | ICD-10-CM | POA: Diagnosis not present

## 2017-09-15 DIAGNOSIS — N2581 Secondary hyperparathyroidism of renal origin: Secondary | ICD-10-CM | POA: Diagnosis not present

## 2017-09-16 ENCOUNTER — Encounter (HOSPITAL_COMMUNITY): Payer: Self-pay

## 2017-09-16 ENCOUNTER — Other Ambulatory Visit: Payer: Self-pay

## 2017-09-16 ENCOUNTER — Emergency Department (HOSPITAL_COMMUNITY): Payer: Medicare Other

## 2017-09-16 ENCOUNTER — Inpatient Hospital Stay (HOSPITAL_COMMUNITY)
Admission: EM | Admit: 2017-09-16 | Discharge: 2017-09-19 | DRG: 308 | Disposition: A | Discharge status: Home Health | Payer: Medicare Other | Attending: Internal Medicine | Admitting: Internal Medicine

## 2017-09-16 ENCOUNTER — Inpatient Hospital Stay (HOSPITAL_COMMUNITY): Payer: Medicare Other

## 2017-09-16 DIAGNOSIS — I953 Hypotension of hemodialysis: Secondary | ICD-10-CM | POA: Diagnosis not present

## 2017-09-16 DIAGNOSIS — I4891 Unspecified atrial fibrillation: Secondary | ICD-10-CM | POA: Diagnosis present

## 2017-09-16 DIAGNOSIS — Z87891 Personal history of nicotine dependence: Secondary | ICD-10-CM

## 2017-09-16 DIAGNOSIS — Z8673 Personal history of transient ischemic attack (TIA), and cerebral infarction without residual deficits: Secondary | ICD-10-CM

## 2017-09-16 DIAGNOSIS — I120 Hypertensive chronic kidney disease with stage 5 chronic kidney disease or end stage renal disease: Secondary | ICD-10-CM | POA: Diagnosis present

## 2017-09-16 DIAGNOSIS — Z7901 Long term (current) use of anticoagulants: Secondary | ICD-10-CM | POA: Diagnosis not present

## 2017-09-16 DIAGNOSIS — K7689 Other specified diseases of liver: Secondary | ICD-10-CM | POA: Diagnosis not present

## 2017-09-16 DIAGNOSIS — Z833 Family history of diabetes mellitus: Secondary | ICD-10-CM | POA: Diagnosis not present

## 2017-09-16 DIAGNOSIS — K219 Gastro-esophageal reflux disease without esophagitis: Secondary | ICD-10-CM | POA: Diagnosis present

## 2017-09-16 DIAGNOSIS — Z8249 Family history of ischemic heart disease and other diseases of the circulatory system: Secondary | ICD-10-CM

## 2017-09-16 DIAGNOSIS — I70232 Atherosclerosis of native arteries of right leg with ulceration of calf: Secondary | ICD-10-CM | POA: Diagnosis not present

## 2017-09-16 DIAGNOSIS — L97211 Non-pressure chronic ulcer of right calf limited to breakdown of skin: Secondary | ICD-10-CM | POA: Diagnosis not present

## 2017-09-16 DIAGNOSIS — I70248 Atherosclerosis of native arteries of left leg with ulceration of other part of lower left leg: Secondary | ICD-10-CM | POA: Diagnosis not present

## 2017-09-16 DIAGNOSIS — R6510 Systemic inflammatory response syndrome (SIRS) of non-infectious origin without acute organ dysfunction: Secondary | ICD-10-CM

## 2017-09-16 DIAGNOSIS — R7989 Other specified abnormal findings of blood chemistry: Secondary | ICD-10-CM

## 2017-09-16 DIAGNOSIS — Z9862 Peripheral vascular angioplasty status: Secondary | ICD-10-CM | POA: Diagnosis not present

## 2017-09-16 DIAGNOSIS — E7800 Pure hypercholesterolemia, unspecified: Secondary | ICD-10-CM | POA: Diagnosis present

## 2017-09-16 DIAGNOSIS — D638 Anemia in other chronic diseases classified elsewhere: Secondary | ICD-10-CM | POA: Diagnosis present

## 2017-09-16 DIAGNOSIS — E1129 Type 2 diabetes mellitus with other diabetic kidney complication: Secondary | ICD-10-CM | POA: Diagnosis not present

## 2017-09-16 DIAGNOSIS — Z79891 Long term (current) use of opiate analgesic: Secondary | ICD-10-CM

## 2017-09-16 DIAGNOSIS — R945 Abnormal results of liver function studies: Secondary | ICD-10-CM

## 2017-09-16 DIAGNOSIS — L97821 Non-pressure chronic ulcer of other part of left lower leg limited to breakdown of skin: Secondary | ICD-10-CM | POA: Diagnosis not present

## 2017-09-16 DIAGNOSIS — Z90710 Acquired absence of both cervix and uterus: Secondary | ICD-10-CM | POA: Diagnosis not present

## 2017-09-16 DIAGNOSIS — L8990 Pressure ulcer of unspecified site, unspecified stage: Secondary | ICD-10-CM | POA: Diagnosis present

## 2017-09-16 DIAGNOSIS — D72829 Elevated white blood cell count, unspecified: Secondary | ICD-10-CM | POA: Diagnosis present

## 2017-09-16 DIAGNOSIS — N186 End stage renal disease: Secondary | ICD-10-CM | POA: Diagnosis present

## 2017-09-16 DIAGNOSIS — K625 Hemorrhage of anus and rectum: Secondary | ICD-10-CM | POA: Diagnosis not present

## 2017-09-16 DIAGNOSIS — Z7984 Long term (current) use of oral hypoglycemic drugs: Secondary | ICD-10-CM

## 2017-09-16 DIAGNOSIS — L089 Local infection of the skin and subcutaneous tissue, unspecified: Secondary | ICD-10-CM | POA: Diagnosis present

## 2017-09-16 DIAGNOSIS — Z992 Dependence on renal dialysis: Secondary | ICD-10-CM

## 2017-09-16 DIAGNOSIS — R778 Other specified abnormalities of plasma proteins: Secondary | ICD-10-CM | POA: Diagnosis present

## 2017-09-16 DIAGNOSIS — M109 Gout, unspecified: Secondary | ICD-10-CM | POA: Diagnosis present

## 2017-09-16 DIAGNOSIS — I7025 Atherosclerosis of native arteries of other extremities with ulceration: Secondary | ICD-10-CM | POA: Diagnosis not present

## 2017-09-16 DIAGNOSIS — I480 Paroxysmal atrial fibrillation: Principal | ICD-10-CM | POA: Diagnosis present

## 2017-09-16 DIAGNOSIS — J449 Chronic obstructive pulmonary disease, unspecified: Secondary | ICD-10-CM | POA: Diagnosis not present

## 2017-09-16 DIAGNOSIS — R911 Solitary pulmonary nodule: Secondary | ICD-10-CM

## 2017-09-16 DIAGNOSIS — I700 Atherosclerosis of aorta: Secondary | ICD-10-CM | POA: Diagnosis present

## 2017-09-16 DIAGNOSIS — J984 Other disorders of lung: Secondary | ICD-10-CM | POA: Diagnosis not present

## 2017-09-16 DIAGNOSIS — E1151 Type 2 diabetes mellitus with diabetic peripheral angiopathy without gangrene: Secondary | ICD-10-CM | POA: Diagnosis present

## 2017-09-16 DIAGNOSIS — Z85038 Personal history of other malignant neoplasm of large intestine: Secondary | ICD-10-CM | POA: Diagnosis not present

## 2017-09-16 DIAGNOSIS — I361 Nonrheumatic tricuspid (valve) insufficiency: Secondary | ICD-10-CM | POA: Diagnosis not present

## 2017-09-16 DIAGNOSIS — Z48812 Encounter for surgical aftercare following surgery on the circulatory system: Secondary | ICD-10-CM | POA: Diagnosis not present

## 2017-09-16 DIAGNOSIS — Z961 Presence of intraocular lens: Secondary | ICD-10-CM | POA: Diagnosis present

## 2017-09-16 DIAGNOSIS — E1122 Type 2 diabetes mellitus with diabetic chronic kidney disease: Secondary | ICD-10-CM | POA: Diagnosis present

## 2017-09-16 DIAGNOSIS — Z9842 Cataract extraction status, left eye: Secondary | ICD-10-CM

## 2017-09-16 DIAGNOSIS — K649 Unspecified hemorrhoids: Secondary | ICD-10-CM | POA: Diagnosis not present

## 2017-09-16 DIAGNOSIS — Z9841 Cataract extraction status, right eye: Secondary | ICD-10-CM | POA: Diagnosis not present

## 2017-09-16 DIAGNOSIS — Z7982 Long term (current) use of aspirin: Secondary | ICD-10-CM

## 2017-09-16 DIAGNOSIS — R748 Abnormal levels of other serum enzymes: Secondary | ICD-10-CM | POA: Diagnosis not present

## 2017-09-16 DIAGNOSIS — E785 Hyperlipidemia, unspecified: Secondary | ICD-10-CM | POA: Diagnosis present

## 2017-09-16 DIAGNOSIS — K8020 Calculus of gallbladder without cholecystitis without obstruction: Secondary | ICD-10-CM | POA: Diagnosis not present

## 2017-09-16 LAB — COMPREHENSIVE METABOLIC PANEL
ALT: 32 U/L (ref 14–54)
AST: 49 U/L — AB (ref 15–41)
Albumin: 2.6 g/dL — ABNORMAL LOW (ref 3.5–5.0)
Alkaline Phosphatase: 136 U/L — ABNORMAL HIGH (ref 38–126)
Anion gap: 16 — ABNORMAL HIGH (ref 5–15)
BILIRUBIN TOTAL: 0.7 mg/dL (ref 0.3–1.2)
BUN: 38 mg/dL — AB (ref 6–20)
CALCIUM: 9.1 mg/dL (ref 8.9–10.3)
CHLORIDE: 95 mmol/L — AB (ref 101–111)
CO2: 25 mmol/L (ref 22–32)
CREATININE: 5.77 mg/dL — AB (ref 0.44–1.00)
GFR, EST AFRICAN AMERICAN: 7 mL/min — AB (ref >60)
GFR, EST NON AFRICAN AMERICAN: 6 mL/min — AB (ref >60)
Glucose, Bld: 242 mg/dL — ABNORMAL HIGH (ref 65–99)
Potassium: 4.6 mmol/L (ref 3.5–5.1)
Sodium: 136 mmol/L (ref 135–145)
Total Protein: 7.6 g/dL (ref 6.5–8.1)

## 2017-09-16 LAB — I-STAT CG4 LACTIC ACID, ED
Lactic Acid, Venous: 3.68 mmol/L (ref 0.5–1.9)
Lactic Acid, Venous: 1.44 mmol/L (ref 0.5–1.9)

## 2017-09-16 LAB — GLUCOSE, CAPILLARY
GLUCOSE-CAPILLARY: 203 mg/dL — AB (ref 65–99)
GLUCOSE-CAPILLARY: 146 mg/dL — AB (ref 65–99)

## 2017-09-16 LAB — CBC WITH DIFFERENTIAL/PLATELET
Basophils Absolute: 0 10*3/uL (ref 0.0–0.1)
Basophils Relative: 0 %
EOS PCT: 1 %
Eosinophils Absolute: 0.1 10*3/uL (ref 0.0–0.7)
HCT: 31.5 % — ABNORMAL LOW (ref 36.0–46.0)
Hemoglobin: 9.3 g/dL — ABNORMAL LOW (ref 12.0–15.0)
LYMPHS ABS: 1.1 10*3/uL (ref 0.7–4.0)
LYMPHS PCT: 6 %
MCH: 26.3 pg (ref 26.0–34.0)
MCHC: 29.5 g/dL — ABNORMAL LOW (ref 30.0–36.0)
MCV: 89.2 fL (ref 78.0–100.0)
MONOS PCT: 7 %
Monocytes Absolute: 1.4 10*3/uL — ABNORMAL HIGH (ref 0.1–1.0)
Neutro Abs: 15.7 10*3/uL — ABNORMAL HIGH (ref 1.7–7.7)
Neutrophils Relative %: 86 %
PLATELETS: 315 10*3/uL (ref 150–400)
RBC: 3.53 MIL/uL — ABNORMAL LOW (ref 3.87–5.11)
RDW: 15.3 % (ref 11.5–15.5)
WBC: 18.3 10*3/uL — ABNORMAL HIGH (ref 4.0–10.5)

## 2017-09-16 LAB — TSH: TSH: 2.151 u[IU]/mL (ref 0.350–4.500)

## 2017-09-16 LAB — PROTIME-INR
INR: 2.73
Prothrombin Time: 28.7 seconds — ABNORMAL HIGH (ref 11.4–15.2)

## 2017-09-16 LAB — TROPONIN I
TROPONIN I: 0.08 ng/mL — AB (ref <0.03)
Troponin I: 0.06 ng/mL (ref <0.03)

## 2017-09-16 MED ORDER — VANCOMYCIN HCL 500 MG IV SOLR
500.0000 mg | INTRAVENOUS | Status: DC
  Administered 2017-09-17: 500 mg via INTRAVENOUS
  Filled 2017-09-16 (×2): qty 500

## 2017-09-16 MED ORDER — SODIUM CHLORIDE 0.9 % IV SOLN
250.0000 mL | INTRAVENOUS | Status: DC | PRN
  Administered 2017-09-17: 250 mL via INTRAVENOUS

## 2017-09-16 MED ORDER — PIPERACILLIN-TAZOBACTAM 3.375 G IVPB 30 MIN
3.3750 g | Freq: Once | INTRAVENOUS | Status: AC
  Administered 2017-09-16: 3.375 g via INTRAVENOUS
  Filled 2017-09-16: qty 50

## 2017-09-16 MED ORDER — ATENOLOL 25 MG PO TABS: 100.0000 mg | ORAL_TABLET | Freq: Every day | ORAL | Status: DC

## 2017-09-16 MED ORDER — TRAMADOL HCL 50 MG PO TABS
100.0000 mg | ORAL_TABLET | Freq: Every day | ORAL | Status: DC
  Administered 2017-09-16 – 2017-09-18 (×3): 100 mg via ORAL
  Filled 2017-09-16 (×4): qty 2

## 2017-09-16 MED ORDER — ACETAMINOPHEN 650 MG RE SUPP: 650.0000 mg | Freq: Four times a day (QID) | RECTAL | Status: DC | PRN

## 2017-09-16 MED ORDER — DILTIAZEM HCL ER COATED BEADS 120 MG PO CP24
120.0000 mg | ORAL_CAPSULE | Freq: Every day | ORAL | Status: DC
  Administered 2017-09-16 – 2017-09-18 (×3): 120 mg via ORAL
  Filled 2017-09-16 (×3): qty 1

## 2017-09-16 MED ORDER — WARFARIN SODIUM 2.5 MG PO TABS
2.5000 mg | ORAL_TABLET | Freq: Once | ORAL | Status: AC
  Administered 2017-09-16: 2.5 mg via ORAL
  Filled 2017-09-16 (×2): qty 1

## 2017-09-16 MED ORDER — PIPERACILLIN-TAZOBACTAM 3.375 G IVPB
3.3750 g | Freq: Two times a day (BID) | INTRAVENOUS | Status: DC
  Administered 2017-09-16 – 2017-09-18 (×4): 3.375 g via INTRAVENOUS
  Filled 2017-09-16 (×4): qty 50

## 2017-09-16 MED ORDER — VANCOMYCIN HCL IN DEXTROSE 1-5 GM/200ML-% IV SOLN
1000.0000 mg | Freq: Once | INTRAVENOUS | Status: AC
  Administered 2017-09-16: 1000 mg via INTRAVENOUS
  Filled 2017-09-16: qty 200

## 2017-09-16 MED ORDER — ATORVASTATIN CALCIUM 40 MG PO TABS
80.0000 mg | ORAL_TABLET | Freq: Every day | ORAL | Status: AC
  Administered 2017-09-17: 80 mg via ORAL
  Filled 2017-09-16: qty 2

## 2017-09-16 MED ORDER — FUROSEMIDE 40 MG PO TABS
40.0000 mg | ORAL_TABLET | Freq: Every day | ORAL | Status: DC
  Administered 2017-09-17 – 2017-09-19 (×3): 40 mg via ORAL
  Filled 2017-09-16 (×3): qty 1

## 2017-09-16 MED ORDER — AMLODIPINE BESYLATE 5 MG PO TABS: 10.0000 mg | ORAL_TABLET | Freq: Every day | ORAL | Status: DC

## 2017-09-16 MED ORDER — ACETAMINOPHEN 325 MG PO TABS
650.0000 mg | ORAL_TABLET | Freq: Four times a day (QID) | ORAL | Status: DC | PRN
  Administered 2017-09-19: 650 mg via ORAL
  Filled 2017-09-16: qty 2

## 2017-09-16 MED ORDER — ASPIRIN EC 81 MG PO TBEC
81.0000 mg | DELAYED_RELEASE_TABLET | Freq: Every day | ORAL | Status: DC
  Administered 2017-09-17 – 2017-09-19 (×3): 81 mg via ORAL
  Filled 2017-09-16 (×3): qty 1

## 2017-09-16 MED ORDER — PANTOPRAZOLE SODIUM 40 MG PO TBEC
40.0000 mg | DELAYED_RELEASE_TABLET | Freq: Every day | ORAL | Status: DC
  Administered 2017-09-17 – 2017-09-19 (×3): 40 mg via ORAL
  Filled 2017-09-16 (×3): qty 1

## 2017-09-16 MED ORDER — WARFARIN - PHARMACIST DOSING INPATIENT
Freq: Every day | Status: DC
  Administered 2017-09-16 – 2017-09-18 (×3)

## 2017-09-16 MED ORDER — SODIUM CHLORIDE 0.9% FLUSH
3.0000 mL | Freq: Two times a day (BID) | INTRAVENOUS | Status: DC
  Administered 2017-09-17 – 2017-09-19 (×4): 3 mL via INTRAVENOUS

## 2017-09-16 MED ORDER — INSULIN ASPART 100 UNIT/ML ~~LOC~~ SOLN: 0.0000 [IU] | Freq: Every day | SUBCUTANEOUS | Status: DC

## 2017-09-16 MED ORDER — METOPROLOL TARTRATE 5 MG/5ML IV SOLN
5.0000 mg | Freq: Once | INTRAVENOUS | Status: AC
  Administered 2017-09-16: 5 mg via INTRAVENOUS
  Filled 2017-09-16: qty 5

## 2017-09-16 MED ORDER — INSULIN ASPART 100 UNIT/ML ~~LOC~~ SOLN
0.0000 [IU] | Freq: Three times a day (TID) | SUBCUTANEOUS | Status: DC
  Administered 2017-09-16: 3 [IU] via SUBCUTANEOUS
  Administered 2017-09-17: 1 [IU] via SUBCUTANEOUS
  Administered 2017-09-17 – 2017-09-18 (×2): 2 [IU] via SUBCUTANEOUS
  Administered 2017-09-18 – 2017-09-19 (×2): 3 [IU] via SUBCUTANEOUS

## 2017-09-16 MED ORDER — HYDRALAZINE HCL 25 MG PO TABS
50.0000 mg | ORAL_TABLET | Freq: Two times a day (BID) | ORAL | Status: DC
  Filled 2017-09-16 (×4): qty 2

## 2017-09-16 MED ORDER — LINAGLIPTIN 5 MG PO TABS
5.0000 mg | ORAL_TABLET | Freq: Every day | ORAL | Status: DC
  Administered 2017-09-17 – 2017-09-19 (×3): 5 mg via ORAL
  Filled 2017-09-16 (×3): qty 1

## 2017-09-16 MED ORDER — SEVELAMER CARBONATE 800 MG PO TABS
3200.0000 mg | ORAL_TABLET | Freq: Three times a day (TID) | ORAL | Status: DC
  Administered 2017-09-16 – 2017-09-19 (×5): 3200 mg via ORAL
  Filled 2017-09-16 (×6): qty 4

## 2017-09-16 MED ORDER — DILTIAZEM HCL ER COATED BEADS 240 MG PO CP24
240.0000 mg | ORAL_CAPSULE | Freq: Every day | ORAL | Status: DC
  Administered 2017-09-18 – 2017-09-19 (×2): 240 mg via ORAL
  Filled 2017-09-16 (×2): qty 1

## 2017-09-16 MED ORDER — DILTIAZEM HCL ER BEADS 120 MG PO CP24: 120.0000 mg | ORAL_CAPSULE | Freq: Two times a day (BID) | ORAL | Status: DC

## 2017-09-16 MED ORDER — SODIUM CHLORIDE 0.9 % IV BOLUS (SEPSIS)
500.0000 mL | Freq: Once | INTRAVENOUS | Status: AC
  Administered 2017-09-16: 500 mL via INTRAVENOUS

## 2017-09-16 MED ORDER — OXYCODONE-ACETAMINOPHEN 5-325 MG PO TABS
1.0000 | ORAL_TABLET | Freq: Four times a day (QID) | ORAL | Status: DC | PRN
  Administered 2017-09-16 – 2017-09-17 (×3): 1 via ORAL
  Filled 2017-09-16 (×3): qty 1

## 2017-09-16 MED ORDER — SODIUM CHLORIDE 0.9% FLUSH: 3.0000 mL | INTRAVENOUS | Status: DC | PRN

## 2017-09-16 NOTE — ED Provider Notes (Signed)
[facility] EMERGENCY DEPARTMENT Provider Note   CSN: [id number] Arrival date & time: 09/16/17  1104     History   Chief Complaint Chief Complaint  Patient presents with  . Irregular Heart Beat    HPI Destiny Boyd is a 84 y.o. female.  The history is provided by the patient and a relative.  Illness  This is a recurrent problem. The current episode started 3 to 5 hours ago. The problem has not changed since onset.Pertinent negatives include no chest pain, no abdominal pain, no headaches and no shortness of breath. Nothing aggravates the symptoms. Nothing relieves the symptoms. She has tried nothing for the symptoms. The treatment provided no relief.    84 year old female with A. fib on Coumadin, end-stage renal disease on dialysis, with recent admission for ulcerations on her lower extremities thought to be vascular in origin with vascular procedures done in early January.  She is brought in today after the home health aides noted her to be tachycardic.  The patient denies any symptoms and states she feels at baseline.  The daughter here is also giving some history and states that the patient did not feel well this morning, complaining of increased pain in her left lower leg where her ulcerations are.  Patient states that is completely improved.  Per the daughter's report the patient did have difficulty with tachycardia while she was in the hospital.  Patient denies any chest pain or shortness of breath.  There is been no weakness.  No trauma.  She has been taking her medications regularly.  Past Medical History:  Diagnosis Date  . 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 Colon cancer ([place])    cecum cancer  . Diabetes mellitus   . ESRD (end stage renal disease) ([place])   . Full dentures   . GERD (gastroesophageal reflux disease)   . Gout   . Hypercholesteremia   . Hypertension   . Iron deficiency anemia   . Leukopenia   . PAF (paroxysmal atrial fibrillation) (H[facility])    a. on  Coumadin for anticoagulation  . Peripheral vascular disease ([place])   . Stroke ([place]) 2005  . Wears glasses     Patient Active Problem List   Diagnosis Date Noted  . PAD (peripheral artery disease) ([place]) 08/31/2017  . Atherosclerosis of native arteries of the extremities with ulceration ([place]) 08/21/2017  . 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 Calciphylaxis of right lower extremity with nonhealing ulcer, limited to breakdown of skin ([place]) 08/21/2017  . Long term (current) use of anticoagulants [Z79.01] 03/09/2017  . Nausea and vomiting   . Anemia   . Demand ischemia of myocardium ([place])   . Encounter for anticoagulation discussion and counseling   . Atrial fibrillation with RVR ([place]) 01/24/2017  . Elevated troponin 01/24/2017  . DM type 2 (diabetes mellitus, type 2) ([place]) 01/24/2017  . Aortic atherosclerosis ([place]) 01/24/2017  . ESRD on dialysis ([place]) 06/30/2013  . Anemia in chronic kidney disease(285.21) 05/05/2013  . Iron deficiency 05/20/2012  . HEMORRHOIDS 11/22/2008  . HEMATURIA UNSPECIFIED 11/22/2008  . 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 Essential hypertension 10/30/2008  . TOBACCO USE, QUIT 10/30/2008    Past Surgical History:  Procedure Laterality Date  . ABDOMINAL AORTOGRAM W/LOWER EXTREMITY N/A 08/27/2017   Procedure: ABDOMINAL AORTOGRAM W/LOWER EXTREMITY;  Surgeon: Conrad Bantam, MD;  Location: [facility];  Service: Cardiovascular;  Laterality: N/A;  . ABDOMINAL HYSTERECTOMY    .  AV FISTULA PLACEMENT Left 08/24/2013   Procedure: ARTERIOVENOUS (AV) FISTULA CREATION- LEFT BRACHIAL CEPHALIC;  Surgeon: Conrad Woodland, MD;  Location: [place];  Service: Vascular;  Laterality: Left;  . BIOPSY OF SKIN SUBCUTANEOUS TISSUE AND/OR MUCOUS MEMBRANE Left 08/31/2017   Procedure: BIOPSY OF SKIN SUBCUTANEOUS TISSUE  LEFT CALF;  Surgeon: Conrad [place], MD;  Location: [facility];  Service: Vascular;  Laterality: Left;  .  CATARACT EXTRACTION, BILATERAL     lens implants  . COLON SURGERY  2010  . COLONOSCOPY  2008   Dr. Oneida Alar: 3-4 cm cecal polyp partially removed, path noting cecal adenocarcinoma, underwent right hemicolectomy with TI resection  . COLONOSCOPY  2009   Dr. Oneida Alar: 69mm sessile rectal polyp (benign), rare diverticula  . ESOPHAGOGASTRODUODENOSCOPY  2008   Dr. Oneida Alar: normal esophagus  . ESOPHAGOGASTRODUODENOSCOPY  2009   Dr. Oneida Alar: multiple antral erosions (reactive gastropathy), normal duodenum, normal esophagus  . EYE SURGERY    . GIVENS CAPSULE STUDY  2009   normal   . HEMICOLECTOMY  2008  . ILIAC ATHERECTOMY Left 08/31/2017   Procedure: ORBITAL ATHERECTOMY of Left Superficial Femoral  and Peroneal  Artery. Drug Coated ANGIOPLASTY of Left Superficial Femoral Artery. Angioplasty of Peroneal Artery;  Surgeon: Conrad Ali Chukson, MD;  Location: [place];  Service: Vascular;  Laterality: Left;  . LOWER EXTREMITY ANGIOGRAM  08/31/2017  . LOWER EXTREMITY ANGIOGRAM Left 08/31/2017   Procedure: LOWER EXTREMITY ANGIOGRAM LEFT LEG RUNOFF;  Surgeon: Conrad Kent, MD;  Location: [facility];  Service: Vascular;  Laterality: Left;  Marland Kitchen MULTIPLE TOOTH EXTRACTIONS    . PERIPHERAL VASCULAR CATHETERIZATION N/A 03/03/2016   Procedure: Fistulagram;  Surgeon: Angelia Mould, MD;  Location: [facility];  Service: Cardiovascular;  Laterality: N/A;  . PERIPHERAL VASCULAR CATHETERIZATION Left 03/03/2016   Procedure: Peripheral Vascular Balloon Angioplasty;  Surgeon: Angelia Mould, MD;  Location: [facility];  Service: Cardiovascular;  Laterality: Left;  arm fistula pta    OB History    No data available       Home Medications    Prior to Admission medications   Medication Sig Start Date End Date Taking? Authorizing Provider  amLODipine (NORVASC) 10 MG tablet Take 1 tablet by mouth daily. 02/12/15  Yes [provider]  aspirin EC 81 MG tablet Take 81 mg by mouth daily.   Yes [provider]  atenolol (TENORMIN) 100 MG tablet Take 1 tablet by mouth daily. 02/12/15  Yes [provider]  diltiazem (CARDIZEM CD) 120 MG 24 hr capsule Take 240 mg ( 2 tablets ) in th am, and take 120 mg ( 1 tablet)  in the pm Patient taking differently: Take 120-240 mg by mouth See admin instructions. Take 240 mg ( 2 tablets ) in th am, and take 120 mg ( 1 tablet)  in the pm 06/25/17  Yes Herminio Commons, MD  furosemide (LASIX) 40 MG tablet Take 1 tablet by mouth daily. 02/12/15  Yes [provider]  hydrALAZINE (APRESOLINE) 50 MG tablet Take 1 tablet by mouth 2 (two) times daily. 02/12/15  Yes [provider]  multivitamin (RENA-VIT) TABS tablet Take 1 tablet by mouth daily.   Yes [provider]  oxyCODONE-acetaminophen (PERCOCET/ROXICET) 5-325 MG tablet Take 1 tablet by mouth every 6 (six) hours as needed for moderate pain. 09/01/17  Yes Dagoberto Ligas, PA-C  pantoprazole (PROTONIX) 40 MG tablet Take 1 tablet (40 mg total) by mouth daily. 01/30/17  Yes  Rosita Fire, MD  sevelamer carbonate (RENVELA) 800 MG tablet Take 3,200 mg by mouth 3 (three) times daily with meals. & 1600 by mouth with snacks   Yes [provider]  TRADJENTA 5 MG TABS tablet Take 1 tablet by mouth daily. 02/06/17  Yes [provider]  traMADol (ULTRAM) 50 MG tablet Take 100 mg by mouth at bedtime.    Yes [provider]  warfarin (COUMADIN) 2.5 MG tablet Take 2.5-5 mg by mouth. Per 09/16/17 warfarin note in chart:  2.5 mg Monday, Wednesday and Friday, and 5 mg Tuesday, Thursday, Saturday and sunday 09/03/17  Yes Dagoberto Ligas, PA-C  diltiazem [facility]) 120 MG 24 hr capsule Take 120 mg by mouth 2 (two) times daily. 06/21/17   [provider]    Family History Family History  Problem Relation Age of Onset  . Diabetes Mother   . Hypertension Mother   . Hyperlipidemia Daughter   . Varicose Veins Daughter   . Cancer Son   . Diabetes Son   . Heart  disease Son   . Hyperlipidemia Son     Social History Social History   Tobacco Use  . Smoking status: Former Smoker    Packs/day: 1.00    Years: 10.00    Pack years: 10.00    Types: Cigarettes    Last attempt to quit: 04/12/1985    Years since quitting: 32.4  . Smokeless tobacco: Never Used  Substance Use Topics  . Alcohol use: No    Alcohol/week: 0.0 oz  . Drug use: No     Allergies   Patient has no known allergies.   Review of Systems Review of Systems  Constitutional: Negative for chills and fever.  HENT: Negative for ear pain and sore throat.   Eyes: Negative for pain and visual disturbance.  Respiratory: Negative for cough and shortness of breath.   Cardiovascular: Negative for chest pain and palpitations.  Gastrointestinal: Negative for abdominal pain and vomiting.  Genitourinary: Negative for hematuria.  Musculoskeletal: Negative for arthralgias and back pain.  Skin: Negative for color change and rash.  Neurological: Negative for seizures, syncope and headaches.  All other systems reviewed and are negative.    Physical Exam Updated Vital Signs BP 127/85 (BP Location: Right Arm)   Pulse (!) 136   Temp 99.2 F (37.3 C) (Oral)   Resp 18   Ht 5\' 4"  (1.626 m)   Wt 50.8 kg (112 lb)   SpO2 91%   BMI 19.22 kg/m   Physical Exam  Constitutional: She appears well-developed and well-nourished. No distress.  HENT:  Head: Normocephalic and atraumatic.  Eyes: Conjunctivae and EOM are normal.  Neck: Normal range of motion. Neck supple. No JVD present.  Cardiovascular: Intact distal pulses. An irregularly irregular rhythm present. Tachycardia present.  Murmur heard.  Systolic murmur is present. Pulmonary/Chest: Effort normal and breath sounds normal. No respiratory distress.  Abdominal: Soft. There is no tenderness.  Musculoskeletal: She exhibits no edema.  Neurological: She is alert.  Skin: Skin is warm and dry.     Psychiatric: She has a normal mood and  affect.  Nursing note and vitals reviewed.    ED Treatments / Results  Labs (all labs ordered are listed, but only abnormal results are displayed) Labs Reviewed  COMPREHENSIVE METABOLIC PANEL - Abnormal; Notable for the following components:      Result Value   Chloride 95 (*)    Glucose, Bld 242 (*)    BUN 38 (*)  Creatinine, Ser 5.77 (*)    Albumin 2.6 (*)    AST 49 (*)    Alkaline Phosphatase 136 (*)    GFR calc non Af Amer 6 (*)    GFR calc Af Amer 7 (*)    Anion gap 16 (*)    All other components within normal limits  CBC WITH DIFFERENTIAL/PLATELET - Abnormal; Notable for the following components:   WBC 18.3 (*)    RBC 3.53 (*)    Hemoglobin 9.3 (*)    HCT 31.5 (*)    MCHC 29.5 (*)    Neutro Abs 15.7 (*)    Monocytes Absolute 1.4 (*)    All other components within normal limits  PROTIME-INR - Abnormal; Notable for the following components:   Prothrombin Time 28.7 (*)    All other components within normal limits  TROPONIN I - Abnormal; Notable for the following components:   Troponin I 0.08 (*)    All other components within normal limits  GLUCOSE, CAPILLARY - Abnormal; Notable for the following components:   Glucose-Capillary 203 (*)    All other components within normal limits  I-STAT CG4 LACTIC ACID, ED - Abnormal; Notable for the following components:   Lactic Acid, Venous 3.68 (*)    All other components within normal limits  CULTURE, BLOOD (ROUTINE X 2)  CULTURE, BLOOD (ROUTINE X 2)  MRSA PCR SCREENING  URINALYSIS, ROUTINE W REFLEX MICROSCOPIC  PROTIME-INR  TROPONIN I  TROPONIN I  TROPONIN I  TSH  CBC  COMPREHENSIVE METABOLIC PANEL  HEPATITIS PANEL, ACUTE  I-STAT CG4 LACTIC ACID, ED    EKG  EKG Interpretation  Date/Time:  Wednesday September 16 2017 11:17:19 EST Ventricular Rate:  148 PR Interval:    QRS Duration: 77 QT Interval:  301 QTC Calculation: 473 R Axis:   -5 Text Interpretation:  afib with RVR. wandering baseline. no acute st/ts.  similar to prior 6/18 Confirmed by Aletta Edouard [phone]) on 09/16/2017 12:48:02 PM       Radiology Dg Chest 2 View  Result Date: 09/16/2017 CLINICAL DATA:  Hypertension and tachycardia. EXAM: CHEST  2 VIEW COMPARISON:  01/24/2017. FINDINGS: Trachea is midline. Heart is enlarged. Thoracic aorta is calcified. There may be a calcified granuloma in the right upper lobe. Small density in the left midlung zone likely relates to the EKG lead. Linear scarring in the lingula. No pleural fluid. Lungs are otherwise clear. IMPRESSION: 1. Small density in the left midlung zone is likely related to the EKG lead. Difficult to exclude a pulmonary nodule. Consider PA and lateral views of the chest in the nonacute setting, in further evaluation, as clinically indicated. 2.  Aortic atherosclerosis (ICD10-170.0). Electronically Signed   By: Lorin Picket M.D.   On: 09/16/2017 12:40    Procedures Procedures (including critical care time)  Medications Ordered in ED Medications - No data to display   Initial Impression / Assessment and Plan / ED Course  I have reviewed the triage vital signs and the nursing notes.  Pertinent labs & imaging results that were available during my care of the patient were reviewed by me and considered in my medical decision making (see chart for details).  Clinical Course as of Sep 16 1509  Wed Sep 16, 2017  1245 Patient is on dialysis Tuesday Thursday Saturday and last dialysis was Tuesday.  [MB]  1510 Discussed with Dr. Maudie Mercury hospitalist accepts patient for admission to his service.  [MB]    Clinical Course User Index [MB]  Hayden Rasmussen, MD    CRITICAL CARE Performed by: Hayden Rasmussen   Total critical care time: 30 minutes  Critical care time was exclusive of separately billable procedures and treating other patients.  Critical care was necessary to treat or prevent imminent or life-threatening deterioration.  Critical care was time spent personally by me on  the following activities: development of treatment plan with patient and/or surrogate as well as nursing, discussions with consultants, evaluation of patient's response to treatment, examination of patient, obtaining history from patient or surrogate, ordering and performing treatments and interventions, ordering and review of laboratory studies, ordering and review of radiographic studies, pulse oximetry and re-evaluation of patient's condition.  Possible sepsis with elevated lactate in setting of elevated WBC. Rapid afib requiring iv medication for rate control. IV abx.  Final Clinical Impressions(s) / ED Diagnoses   Final diagnoses:  Rapid atrial fibrillation (HCC)  SIRS (systemic inflammatory response syndrome) ([place])  ESRD on dialysis [facility])    ED Discharge Orders    None       Hayden Rasmussen, MD 09/16/17 1924

## 2017-09-16 NOTE — H&P (Addendum)
TRH H&P   Patient Demographics:    Destiny Boyd, is a 84 y.o. female  MRN: [id number]   DOB - 1934/08/13  Admit Date - 09/16/2017  Outpatient Primary MD for the patient is Rosita Fire, MD  Referring MD/NP/PA:   Hayden Rasmussen.   Outpatient Specialists:  Dr. Hinda Lenis (nephrology)  Patient coming from: home  Chief Complaint  Patient presents with  . Irregular Heart Beat      HPI:    Destiny Boyd  is a 84 y.o. female, w hypertension, hyperlipidemia, Dm2, ESRD  On HD (T, T, S), Pafib,CVA, PVD  apparently didn't feel well.  Her home health aide noted her to be tachycardic and recommended that she come to Ed. Pt denies medication noncompliance   In Ed,  Pt noted to be in Afib with RVR. Given lopressor '5mg'$  iv x1 in the ED, with resulting hr down to below 100. Pt felt much better.   CXR IMPRESSION: 1. Small density in the left midlung zone is likely related to the EKG lead. Difficult to exclude a pulmonary nodule. Consider PA and lateral views of the chest in the nonacute setting, in further evaluation, as clinically indicated. 2.  Aortic atherosclerosis (ICD10-170.0).  Na 136, K 4.6,  Bun 38, Creatinine 5.77 Ast 49, Alt 32, Alk phos 136, T. Bili 0.7  Wbc 18.3, Hgb 9.3, Plt 315 INR 2.73  Trop 0.08  LA 3.68  Blood culture x2 pending  Pt will be admitted for Afib with RVR, troponin elevation, abnormal liver function, and possible early sepsis.       Review of systems:    In addition to the HPI above,  Specifically no CP  No Fever-chills, No Headache, No changes with Vision or hearing, No problems swallowing food or Liquids, No Chest pain, Cough or Shortness of Breath, No Abdominal pain, No Nausea or Vommitting, Bowel movements are regular, No Blood in stool or Urine, No dysuria, No new skin rashes or bruises, No new joints pains-aches,  No new  weakness, tingling, numbness in any extremity, No recent weight gain or loss, No polyuria, polydypsia or polyphagia, No significant Mental Stressors.  A full 10 point Review of Systems was done, except as stated above, all other Review of Systems were negative.   With Past History of the following :    Past Medical History:  Diagnosis Date  . 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 Colon cancer ([place])    cecum cancer  . Diabetes mellitus   . ESRD (end stage renal disease) ([place])   . Full dentures   . GERD (gastroesophageal reflux disease)   . Gout   . Hypercholesteremia   . Hypertension   . Iron deficiency anemia   . Leukopenia   . PAF (paroxysmal atrial fibrillation) (HCC)    a. on Coumadin for anticoagulation  . Peripheral vascular disease ([place])   .  Stroke ([place]) 2005  . Wears glasses       Past Surgical History:  Procedure Laterality Date  . ABDOMINAL AORTOGRAM W/LOWER EXTREMITY N/A 08/27/2017   Procedure: ABDOMINAL AORTOGRAM W/LOWER EXTREMITY;  Surgeon: Conrad [place], MD;  Location: [facility];  Service: Cardiovascular;  Laterality: N/A;  . ABDOMINAL HYSTERECTOMY    . AV FISTULA PLACEMENT Left 08/24/2013   Procedure: ARTERIOVENOUS (AV) FISTULA CREATION- LEFT BRACHIAL CEPHALIC;  Surgeon: Conrad Franklin, MD;  Location: [facility];  Service: Vascular;  Laterality: Left;  . BIOPSY OF SKIN SUBCUTANEOUS TISSUE AND/OR MUCOUS MEMBRANE Left 08/31/2017   Procedure: BIOPSY OF SKIN SUBCUTANEOUS TISSUE  LEFT CALF;  Surgeon: Conrad Felt, MD;  Location: [facility];  Service: Vascular;  Laterality: Left;  . CATARACT EXTRACTION, BILATERAL     lens implants  . COLON SURGERY  2010  . COLONOSCOPY  2008   Dr. Oneida Alar: 3-4 cm cecal polyp partially removed, path noting cecal adenocarcinoma, underwent right hemicolectomy with TI resection  . COLONOSCOPY  2009   Dr. Oneida Alar: 88m sessile rectal polyp (benign), rare diverticula  . ESOPHAGOGASTRODUODENOSCOPY  2008   Dr. FOneida Alar normal esophagus  .  ESOPHAGOGASTRODUODENOSCOPY  2009   Dr. FOneida Alar multiple antral erosions (reactive gastropathy), normal duodenum, normal esophagus  . EYE SURGERY    . GIVENS CAPSULE STUDY  2009   normal   . HEMICOLECTOMY  2008  . ILIAC ATHERECTOMY Left 08/31/2017   Procedure: ORBITAL ATHERECTOMY of Left Superficial Femoral  and Peroneal  Artery. Drug Coated ANGIOPLASTY of Left Superficial Femoral Artery. Angioplasty of Peroneal Artery;  Surgeon: CConrad [place]kton MD;  [place] [facility]  Service: Vascular;  Laterality: Left;  . LOWER EXTREMITY ANGIOGRAM  08/31/2017  . LOWER EXTREMITY ANGIOGRAM Left 08/31/2017   Procedure: LOWER EXTREMITY ANGIOGRAM LEFT LEG RUNOFF;  Surgeon: CConrad [place] MD;  [facility]  Service: Vascular;  Laterality: Left;  .Marland KitchenMULTIPLE TOOTH EXTRACTIONS    . PERIPHERAL VASCULAR CATHETERIZATION N/A 03/03/2016   Procedure: Fistulagram;  Surgeon: CAngelia Mould MD;  [facility];  Service: Cardiovascular;  Laterality: N/A;  . PERIPHERAL VASCULAR CATHETERIZATION Left 03/03/2016   Procedure: Peripheral Vascular Balloon Angioplasty;  Surgeon: CAngelia Mould MD;  [facility];  Service: Cardiovascular;  Laterality: Left;  arm fistula pta      Social History:     Social History   Tobacco Use  . Smoking status: Former Smoker    Packs/day: 1.00    Years: 10.00    Pack years: 10.00    Types: Cigarettes    Last attempt to quit: 04/12/1985    Years since quitting: 32.4  . Smokeless tobacco: Never Used  Substance Use Topics  . Alcohol use: No    Alcohol/week: 0.0 oz     Lives - at home  Mobility - walks by self   Family History :     Family History  Problem Relation Age of Onset  . Diabetes Mother   . Hypertension Mother   . Hyperlipidemia Daughter   . Varicose Veins Daughter   . Cancer Son   . Diabetes Son   . Heart disease Son   . Hyperlipidemia Son       Home Medications:   Prior to Admission medications   Medication Sig  Start Date End Date Taking? Authorizing Provider  amLODipine (NORVASC) 10 MG tablet Take 1 tablet by mouth daily. 02/12/15  Yes [provider]  aspirin EC 81  MG tablet Take 81 mg by mouth daily.   Yes [provider]  atenolol (TENORMIN) 100 MG tablet Take 1 tablet by mouth daily. 02/12/15  Yes [provider]  diltiazem (CARDIZEM CD) 120 MG 24 hr capsule Take 240 mg ( 2 tablets ) in th am, and take 120 mg ( 1 tablet)  in the pm Patient taking differently: Take 120-240 mg by mouth See admin instructions. Take 240 mg ( 2 tablets ) in th am, and take 120 mg ( 1 tablet)  in the pm 06/25/17  Yes Herminio Commons, MD  furosemide (LASIX) 40 MG tablet Take 1 tablet by mouth daily. 02/12/15  Yes [provider]  hydrALAZINE (APRESOLINE) 50 MG tablet Take 1 tablet by mouth 2 (two) times daily. 02/12/15  Yes [provider]  multivitamin (RENA-VIT) TABS tablet Take 1 tablet by mouth daily.   Yes [provider]  oxyCODONE-acetaminophen (PERCOCET/ROXICET) 5-325 MG tablet Take 1 tablet by mouth every 6 (six) hours as needed for moderate pain. 09/01/17  Yes Dagoberto Ligas, PA-C  pantoprazole (PROTONIX) 40 MG tablet Take 1 tablet (40 mg total) by mouth daily. 01/30/17  Yes Rosita Fire, MD  sevelamer carbonate (RENVELA) 800 MG tablet Take 3,200 mg by mouth 3 (three) times daily with meals. & 1600 by mouth with snacks   Yes [provider]  TRADJENTA 5 MG TABS tablet Take 1 tablet by mouth daily. 02/06/17  Yes [provider]  traMADol (ULTRAM) 50 MG tablet Take 100 mg by mouth at bedtime.    Yes [provider]  warfarin (COUMADIN) 2.5 MG tablet Take 2.5-5 mg by mouth. Per 09/16/17 warfarin note in chart:  2.5 mg Monday, Wednesday and Friday, and 5 mg Tuesday, Thursday, Saturday and sunday 09/03/17  Yes Dagoberto Ligas, PA-C  diltiazem [facility]) 120 MG 24 hr capsule Take 120 mg by mouth 2 (two) times daily. 06/21/17   [provider]     Allergies:    No Known Allergies   Physical Exam:   Vitals  Blood pressure 127/68, pulse (!) 35, temperature 99.2 F (37.3 C), temperature source Oral, resp. rate 14, height '5\' 4"'$  (1.626 m), weight 50.8 kg (112 lb), SpO2 (!) 78 %.   1. General lying in bed in NAD,     2. Normal affect and insight, Not Suicidal or Homicidal, Awake Alert, Oriented X 3.  3. No F.N deficits, ALL C.Nerves Intact, Strength 5/5 all 4 extremities, Sensation intact all 4 extremities, Plantars down going.  4. Ears and Eyes appear Normal, Conjunctivae clear, PERRLA. Moist Oral Mucosa.  5. Supple Neck, No JVD, No cervical lymphadenopathy appriciated, No Carotid Bruits.  6. Symmetrical Chest wall movement, Good air movement bilaterally, CTAB.  7. RRR s1, s2, 2/6 sem rusb  8. Positive Bowel Sounds, Abdomen Soft, No tenderness, No organomegaly appriciated,No rebound -guarding or rigidity.  9.  No Cyanosis, Normal Skin Turgor, No Skin Rash or Bruise.  10. Good muscle tone,  joints appear normal , no effusions, Normal ROM.  11. No Palpable Lymph Nodes in Neck or Axillae  Sutures still in place on left distal lower ext  L AVF   Data Review:    CBC Recent Labs  Lab 09/16/17 1205  WBC 18.3*  HGB 9.3*  HCT 31.5*  PLT 315  MCV 89.2  MCH 26.3  MCHC 29.5*  RDW 15.3  LYMPHSABS 1.1  MONOABS 1.4*  EOSABS 0.1  BASOSABS 0.0   ------------------------------------------------------------------------------------------------------------------  Chemistries  Recent  Labs  Lab 09/16/17 1205  NA 136  K 4.6  CL 95*  CO2 25  GLUCOSE 242*  BUN 38*  CREATININE 5.77*  CALCIUM 9.1  AST 49*  ALT 32  ALKPHOS 136*  BILITOT 0.7   ------------------------------------------------------------------------------------------------------------------ estimated creatinine clearance is 6.1 mL/min (A) (by C-G formula based on SCr of 5.77 mg/dL  (H)). ------------------------------------------------------------------------------------------------------------------ No results for input(s): TSH, T4TOTAL, T3FREE, THYROIDAB in the last 72 hours.  Invalid input(s): FREET3  Coagulation profile Recent Labs  Lab 09/14/17 09/16/17 1205  INR 4.0 2.73   ------------------------------------------------------------------------------------------------------------------- No results for input(s): DDIMER in the last 72 hours. -------------------------------------------------------------------------------------------------------------------  Cardiac Enzymes Recent Labs  Lab 09/16/17 1205  TROPONINI 0.08*   ------------------------------------------------------------------------------------------------------------------ No results found for: BNP   ---------------------------------------------------------------------------------------------------------------  Urinalysis    Component Value Date/Time   COLORURINE YELLOW 11/22/2008 2014   APPEARANCEUR CLEAR 11/22/2008 2014   LABSPEC 1.020 11/22/2008 2014   PHURINE 5.5 11/22/2008 2014   GLUCOSEU NEG mg/dL 11/22/2008 2014   HGBUR TRACE (A) 07/07/2007 0956   BILIRUBINUR NEG 11/22/2008 2014   KETONESUR NEG mg/dL 11/22/2008 2014   PROTEINUR 30 (A) 11/22/2008 2014   UROBILINOGEN 0.2 11/22/2008 2014   NITRITE NEG 11/22/2008 2014   LEUKOCYTESUR NEG 11/22/2008 2014    ----------------------------------------------------------------------------------------------------------------   Imaging Results:    Dg Chest 2 View  Result Date: 09/16/2017 CLINICAL DATA:  Hypertension and tachycardia. EXAM: CHEST  2 VIEW COMPARISON:  01/24/2017. FINDINGS: Trachea is midline. Heart is enlarged. Thoracic aorta is calcified. There may be a calcified granuloma in the right upper lobe. Small density in the left midlung zone likely relates to the EKG lead. Linear scarring in the lingula. No pleural fluid.  Lungs are otherwise clear. IMPRESSION: 1. Small density in the left midlung zone is likely related to the EKG lead. Difficult to exclude a pulmonary nodule. Consider PA and lateral views of the chest in the nonacute setting, in further evaluation, as clinically indicated. 2.  Aortic atherosclerosis (ICD10-170.0). Electronically Signed   By: Lorin Picket M.D.   On: 09/16/2017 12:40     Assessment & Plan:    Active Problems:   ESRD on dialysis [facility])   Atrial fibrillation (HCC)   Elevated troponin   Atrial fibrillation with RVR (HCC)    Afib with RVR Resolved after metoprolol Tele Trop I q6h x3 Check TSH Cardiac echo Cont cardizem Cont atenolol Cont coumadin, pharmacy to dose Cardiology consult requested   Troponin elevation Trop I q6h x3 Cont aspirin Check lipid Start lipitor '80mg'$  po x1  Tachycardia ? Early sepsis (LA elevation, tachycardia, Leukocytosis) Blood culture x2 pending Urinalysis pending Vanco iv, Zosyn iv pharmacy to dose If cultures negative stop abx  ESRD on HD (T, T, S) Nephrology consulted in computer [place] fsbs ac and qhs, ISS  Abnormal liver function Check acute hepatitis panel RUQ ultrasound  Small density left mid lung zone CXR PA and Lateral in AM   Healing wound on left distal lower ext Wound care consult, appreciate input  DVT Prophylaxis  Coumadin   AM Labs Ordered, also please review Full Orders  Family Communication: Admission, patients condition and plan of care including tests being ordered have been discussed with the patient  who indicate understanding and agree with the plan and Code Status.  Code Status FULL CODE  Likely DC to  home  Condition GUARDED   Consults called: nephrology in computer  Admission status: inpatient  Time spent in minutes :  [place] M.D on 09/16/2017 at 3:10 PM  Between 7am to 7pm - Pager - [phone]    After 7pm go to [email or web address] - password  [facility]  [facility] - Office  [phone]

## 2017-09-16 NOTE — Progress Notes (Signed)
Pharmacy Antibiotic Note  Destiny Boyd is a 84 y.o. female admitted on 09/16/2017 with sepsis.  Pharmacy has been consulted for Vancomycin and zosyn dosing.  Plan: Vancomycin 1000mg  loading dose then 500mg   IV after HD on Tuesd, Thurs, and Saturday mcg/mL. Zosyn 3.375g IV q12h (4 hour infusion). F/U cxs and clinical progress Monitor V/S, labs, and levels as indicated  Height: 5\' 4"  (162.6 cm) Weight: 112 lb (50.8 kg) IBW/kg (Calculated) : 54.7  Temp (24hrs), Avg:99.2 F (37.3 C), Min:99.2 F (37.3 C), Max:99.2 F (37.3 C)  Recent Labs  Lab 09/16/17 1205 09/16/17 1220 09/16/17 1516  WBC 18.3*  --   --   CREATININE 5.77*  --   --   LATICACIDVEN  --  3.68* 1.44    Estimated Creatinine Clearance: 6.1 mL/min (A) (by C-G formula based on SCr of 5.77 mg/dL (H)).    No Known Allergies  Antimicrobials this admission: Vancomycin 1/30 >>  Zosyn 1/30 >>   Dose adjustments this admission: N/A  Microbiology results: 1/30 BCx: pending  Thank you for allowing pharmacy to be a part of this patient's care.  Isac Sarna, BS [place], [place] Clinical Pharmacist Pager [phone] 09/16/2017 3:32 PM

## 2017-09-16 NOTE — ED Triage Notes (Signed)
Pt had a visit from home health rn today and was told her heart rate was irregular and her bp was elevated.  Reports had vascular surgery 2 weeks ago and has had home health since then.  Dressing noted to left lower leg.

## 2017-09-16 NOTE — ED Notes (Signed)
Date and time results received: 09/16/17 1322  Test: Troponin Critical Value: 0.08  Name of Provider Notified: Dr. Melina Copa  Orders Received? Or Actions Taken?:No new orders given.

## 2017-09-16 NOTE — Progress Notes (Deleted)
Established Critical Limb Ischemia Patient   History of Present Illness   Destiny Boyd is a 84 y.o. (05/27/1934) female who presents with chief complaint: ***.    Procedures include: 1.  L antegrade cann, OA+PTA L SFA, OA+PTA L peroneal, skin bx x 2 (08/31/17)  Patient skin biopsy did not demonstrate microcalcifications.  The patient has *** rest pain and wounds include: ***.  The patient notes symptoms have *** progressed.  The patient's treatment regimen currently included: maximal medical management and ***.  The patient's PMH, PSH, SH, and FamHx are unchanged from 08/31/17.  Current Outpatient Medications  Medication Sig Dispense Refill  . aspirin EC 81 MG tablet Take 81 mg by mouth daily.    Marland Kitchen diltiazem (CARDIZEM CD) 120 MG 24 hr capsule Take 240 mg ( 2 tablets ) in th am, and take 120 mg ( 1 tablet)  in the pm (Patient taking differently: Take 120-240 mg by mouth See admin instructions. Take 240 mg ( 2 tablets ) in th am, and take 120 mg ( 1 tablet)  in the pm) 90 capsule 3  . multivitamin (RENA-VIT) TABS tablet Take 1 tablet by mouth daily.    Marland Kitchen oxyCODONE-acetaminophen (PERCOCET/ROXICET) 5-325 MG tablet Take 1 tablet by mouth every 6 (six) hours as needed for moderate pain. (Patient not taking: Reported on 09/15/2017) 10 tablet 0  . pantoprazole (PROTONIX) 40 MG tablet Take 1 tablet (40 mg total) by mouth daily. 30 tablet 3  . sevelamer carbonate (RENVELA) 800 MG tablet Take 3,200 mg by mouth 3 (three) times daily with meals. & 1600 by mouth with snacks    . TRADJENTA 5 MG TABS tablet Take 1 tablet by mouth daily.    . traMADol (ULTRAM) 50 MG tablet Take 100 mg by mouth at bedtime.     Marland Kitchen warfarin (COUMADIN) 2.5 MG tablet Take 2.5-5 mg by mouth. Per 09/14/17 warfarin note in chart:  2.5 mg daily, except 5 mg Wed and Sat     No current facility-administered medications for this visit.     On ROS today: ***, ***   Physical Examination  ***There were no vitals filed for  this visit. ***There is no height or weight on file to calculate BMI.  General {LOC:19197::"Somulent","Alert"}, {Orientation:19197::"Confused","O x 3"}, {Weight:19197::"Obese","Cachectic","WD"}, {General state of health:19197::"Ill appearing","Elderly","NAD"}  Pulmonary {Chest wall:19197::"Asx chest movement","Sym exp"}, {Air movt:19197::"Decreased *** air movt","good B air movt"}, {BS:19197::"rales on ***","rhonchi on ***","wheezing on ***","CTA B"}  Cardiac {Rhythm:19197::"Irregularly, irregular rate and rhythm","RRR, Nl S1, S2"}, {Murmur:19197::"Murmur present: ***","no Murmurs"}, {Rubs:19197::"Rub present: ***","No rubs"}, {Gallop:19197::"Gallop present: ***","No S3,S4"}  Vascular Vessel Right Left  Radial {Palpable:19197::"Not palpable","Faintly palpable","Palpable"} {Palpable:19197::"Not palpable","Faintly palpable","Palpable"}  Brachial {Palpable:19197::"Not palpable","Faintly palpable","Palpable"} {Palpable:19197::"Not palpable","Faintly palpable","Palpable"}  Carotid Palpable, {Bruit:19197::"Bruit present","No Bruit"} Palpable, {Bruit:19197::"Bruit present","No Bruit"}  Aorta Not palpable N/A  Femoral {Palpable:19197::"Not palpable","Faintly palpable","Palpable"} {Palpable:19197::"Not palpable","Faintly palpable","Palpable"}  Popliteal {Palpable:19197::"Prominently palpable","Not palpable"} {Palpable:19197::"Prominently palpable","Not palpable"}  PT {Palpable:19197::"Not palpable","Faintly palpable","Palpable"} {Palpable:19197::"Not palpable","Faintly palpable","Palpable"}  DP {Palpable:19197::"Not palpable","Faintly palpable","Palpable"} {Palpable:19197::"Not palpable","Faintly palpable","Palpable"}    Gastro- intestinal soft, {Distension:19197::"distended","non-distended"}, {TTP:19197::"TTP in *** quadrant","appropriate tenderness to palpation","non-tender to palpation"}, {Guarding:19197::"Guarding and rebound in *** quadrant","No guarding or rebound"}, {HSM:19197::"HSM present","no  HSM"}, {Masses:19197::"Mass present: ***","no masses"}, {Flank:19197::"CVAT on ***","Flank bruit present on ***","no CVAT B"}, {AAA:19197::"Palpable prominent aortic pulse","No palpable prominent aortic pulse"}, {Surgical incision:19197::"Surg incisions: ***","Surgical incisions well healed"," "}  Musculo- skeletal M/S 5/5 throughout {MS:19197::"except ***"," "}, Extremities without ischemic changes {MS:19197::"except ***"," "}, {Edema:19197::"Pitting edema present: ***","Non-pitting edema present: ***","No edema present"}, {Varicosities:19197::"Varicosities present: ***","No visible varicosities "}, {  LDS:19197::"Lipodermatosclerosis present: ***","No Lipodermatosclerosis present"}  Neurologic Cranial nerves 2-12 intact{CN:19197::" except ***"," "}, Pain and light touch intact in extremities{CN:19197::" except for decreased sensation in ***"," "}, Motor exam as listed above    Non-Invasive Vascular Imaging   ABI (***)  R:   ABI: *** (***),   PT: {Signals:19197::"none","mono","bi","tri"}  DP: {Signals:19197::"none","mono","bi","tri"}  TBI:  ***  L:   ABI: *** (***),   PT: {Signals:19197::"none","mono","bi","tri"}  DP: {Signals:19197::"none","mono","bi","tri"}  TBI: ***   Medical Decision Making   Destiny Boyd is a 84 y.o. female who presents with: BLE critical limb ischemia with B shin wounds due to B tibial arterial disease   Based on the patient's vascular studies and examination, I have offered the patient: ***.  I discussed in depth with the patient the nature of atherosclerosis, and emphasized the importance of maximal medical management including strict control of blood pressure, blood glucose, and lipid levels, antiplatelet agents, obtaining regular exercise, and cessation of smoking.    The patient is aware that without maximal medical management the underlying atherosclerotic disease process will progress, limiting the benefit of any interventions. The patient is  currently {Statin:19197::"not on on statin as not medically indicated.","not on a statin due to reported allergy.","not on a statin. Patient will be started on Lipitor 10 mg PO daily, to be titrated and managed by their primary physician.","on a statin: Zocor.","on a statin: Lipitor.","on a statin: Pravachol.","on a statin: Crestor.","on a statin: ***."}  The patient is currently {Antiplatelet:19197::"not on an anti-platelet due to allergy.","not on an anti-platelet due to bleeding risks related to use of an anticoagulant.","not on an anti-platelet. Patient will be started on ASA 81 mg PO daily","on an anti-platelet: ASA.","on an anti-platelet: Plavix.","on an anti-platelet: ASA and Plavix.","on an anti-platelet: ***."}  Thank you for allowing [place] to participate in this patient's care.   Adele Barthel, MD, FACS [facility] Office: [phone] Pager: [phone]

## 2017-09-16 NOTE — Progress Notes (Signed)
ANTICOAGULATION CONSULT NOTE - Initial Consult  Pharmacy Consult for Warfarin Indication: atrial fibrillation  No Known Allergies  Patient Measurements: Height: 5\' 4"  (162.6 cm) Weight: 112 lb (50.8 kg) IBW/kg (Calculated) : 54.7   Vital Signs: Temp: 99.2 F (37.3 C) (01/30 1116) Temp Source: Oral (01/30 1116) BP: 127/68 (01/30 1330) Pulse Rate: 35 (01/30 1330)  Labs: Recent Labs    09/14/17 09/16/17 1205  HGB  --  9.3*  HCT  --  31.5*  PLT  --  315  LABPROT  --  28.7*  INR 4.0 2.73  CREATININE  --  5.77*  TROPONINI  --  0.08*    Estimated Creatinine Clearance: 6.1 mL/min (A) (by C-G formula based on SCr of 5.77 mg/dL (H)).   Medical History: Past Medical History:  Diagnosis Date  . 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 Colon cancer ([place])    cecum cancer  . Diabetes mellitus   . ESRD (end stage renal disease) ([place])   . Full dentures   . GERD (gastroesophageal reflux disease)   . Gout   . Hypercholesteremia   . Hypertension   . Iron deficiency anemia   . Leukopenia   . PAF (paroxysmal atrial fibrillation) (HCC)    a. on Coumadin for anticoagulation  . Peripheral vascular disease ([place])   . Stroke ([place]) 2005  . Wears glasses     Medications:   (Not in a hospital admission)  Assessment: 77 yof on coumadin for afib. S/P LLE angiogram/calf biopsy on 1/14.  INR currently 2.73 on admission. Last anti-coag visit on 1/28 had patient taking 2.5 mg MWF and 5 mg ROW.   Goal of Therapy:  INR 2-3 Monitor platelets by anticoagulation protocol: Yes   Plan:  Warfarin 2.5 mg today x1 dose Daily INR, CBC every 3 days  Margot Ables, PharmD Clinical Pharmacist 09/16/2017 3:40 PM

## 2017-09-17 ENCOUNTER — Inpatient Hospital Stay (HOSPITAL_COMMUNITY): Payer: Medicare Other

## 2017-09-17 ENCOUNTER — Other Ambulatory Visit: Payer: Self-pay | Admitting: *Deleted

## 2017-09-17 DIAGNOSIS — N186 End stage renal disease: Secondary | ICD-10-CM

## 2017-09-17 DIAGNOSIS — D72829 Elevated white blood cell count, unspecified: Secondary | ICD-10-CM

## 2017-09-17 DIAGNOSIS — R748 Abnormal levels of other serum enzymes: Secondary | ICD-10-CM

## 2017-09-17 DIAGNOSIS — I4891 Unspecified atrial fibrillation: Secondary | ICD-10-CM

## 2017-09-17 DIAGNOSIS — I361 Nonrheumatic tricuspid (valve) insufficiency: Secondary | ICD-10-CM

## 2017-09-17 DIAGNOSIS — Z992 Dependence on renal dialysis: Secondary | ICD-10-CM | POA: Diagnosis not present

## 2017-09-17 LAB — ECHOCARDIOGRAM COMPLETE
AV Area VTI index: 0.87 cm2/m2
AV Area VTI: 1.01 cm2
AV Mean grad: 12 mmHg
AV Peak grad: 28 mmHg
AV area mean vel ind: 0.76 cm2/m2
AV vel: 1.39
AVAREAMEANV: 1.21 cm2
AVCELMEANRAT: 0.38
AVLVOTPG: 3 mmHg
AVPKVEL: 263 cm/s
Ao pk vel: 0.32 m/s
CHL CUP AV PEAK INDEX: 0.64
CHL CUP MV DEC (S): 158
CHL CUP STROKE VOLUME: 54 mL
DOP CAL AO MEAN VELOCITY: 159 cm/s
EWDT: 158 ms
FS: 35 % (ref 28–44)
Height: 64 in
IVS/LV PW RATIO, ED: 1.05
LA diam end sys: 49 mm
LA diam index: 3.08 cm/m2
LA vol A4C: 108 ml
LA vol index: 67.3 mL/m2
LA vol: 107 mL
LASIZE: 49 mm
LV PW d: 11.4 mm — AB (ref 0.6–1.1)
LV SIMPSON'S DISK: 66
LV dias vol: 82 mL (ref 46–106)
LV sys vol index: 18 mL/m2
LVDIAVOLIN: 52 mL/m2
LVOT SV: 57 mL
LVOT VTI: 18.1 cm
LVOT area: 3.14 cm2
LVOT peak VTI: 0.44 cm
LVOTD: 20 mm
LVOTPV: 84.9 cm/s
LVSYSVOL: 28 mL (ref 14–42)
MV pk E vel: 173 m/s
MVPG: 12 mmHg
RV TAPSE: 15.5 mm
RV sys press: 36 mmHg
Reg peak vel: 265 cm/s
TRMAXVEL: 265 cm/s
VTI: 40.9 cm
Valve area index: 0.87
Valve area: 1.39 cm2
Weight: 1971.2 oz

## 2017-09-17 LAB — COMPREHENSIVE METABOLIC PANEL
ALT: 27 U/L (ref 14–54)
AST: 32 U/L (ref 15–41)
Albumin: 2.3 g/dL — ABNORMAL LOW (ref 3.5–5.0)
Alkaline Phosphatase: 117 U/L (ref 38–126)
Anion gap: 16 — ABNORMAL HIGH (ref 5–15)
BUN: 47 mg/dL — AB (ref 6–20)
CALCIUM: 9.3 mg/dL (ref 8.9–10.3)
CO2: 26 mmol/L (ref 22–32)
CREATININE: 6.74 mg/dL — AB (ref 0.44–1.00)
Chloride: 95 mmol/L — ABNORMAL LOW (ref 101–111)
GFR calc non Af Amer: 5 mL/min — ABNORMAL LOW (ref >60)
GFR, EST AFRICAN AMERICAN: 6 mL/min — AB (ref >60)
Glucose, Bld: 160 mg/dL — ABNORMAL HIGH (ref 65–99)
Potassium: 4.9 mmol/L (ref 3.5–5.1)
SODIUM: 137 mmol/L (ref 135–145)
Total Bilirubin: 0.9 mg/dL (ref 0.3–1.2)
Total Protein: 7 g/dL (ref 6.5–8.1)

## 2017-09-17 LAB — PROTIME-INR
INR: 3.16
Prothrombin Time: 32.2 seconds — ABNORMAL HIGH (ref 11.4–15.2)

## 2017-09-17 LAB — GLUCOSE, CAPILLARY
GLUCOSE-CAPILLARY: 183 mg/dL — AB (ref 65–99)
Glucose-Capillary: 145 mg/dL — ABNORMAL HIGH (ref 65–99)
Glucose-Capillary: 169 mg/dL — ABNORMAL HIGH (ref 65–99)
Glucose-Capillary: 110 mg/dL — ABNORMAL HIGH (ref 65–99)

## 2017-09-17 LAB — CBC
HCT: 28.7 % — ABNORMAL LOW (ref 36.0–46.0)
HEMOGLOBIN: 8.7 g/dL — AB (ref 12.0–15.0)
MCH: 26.5 pg (ref 26.0–34.0)
MCHC: 30.3 g/dL (ref 30.0–36.0)
MCV: 87.5 fL (ref 78.0–100.0)
Platelets: 322 10*3/uL (ref 150–400)
RBC: 3.28 MIL/uL — AB (ref 3.87–5.11)
RDW: 15.1 % (ref 11.5–15.5)
WBC: 19 10*3/uL — AB (ref 4.0–10.5)

## 2017-09-17 LAB — TROPONIN I
Troponin I: 0.06 ng/mL (ref <0.03)
Troponin I: 0.06 ng/mL (ref <0.03)

## 2017-09-17 MED ORDER — METOPROLOL TARTRATE 50 MG PO TABS
50.0000 mg | ORAL_TABLET | Freq: Two times a day (BID) | ORAL | Status: DC
  Administered 2017-09-17 – 2017-09-19 (×2): 50 mg via ORAL
  Filled 2017-09-17 (×5): qty 1

## 2017-09-17 MED ORDER — PENTAFLUOROPROP-TETRAFLUOROETH EX AERO: 1.0000 "application " | INHALATION_SPRAY | CUTANEOUS | Status: DC | PRN

## 2017-09-17 MED ORDER — LIDOCAINE-PRILOCAINE 2.5-2.5 % EX CREA: 1.0000 "application " | TOPICAL_CREAM | CUTANEOUS | Status: DC | PRN

## 2017-09-17 MED ORDER — DILTIAZEM HCL 25 MG/5ML IV SOLN: 10.0000 mg | Freq: Four times a day (QID) | INTRAVENOUS | Status: DC | PRN

## 2017-09-17 MED ORDER — HYDROCORTISONE ACETATE 25 MG RE SUPP
25.0000 mg | Freq: Two times a day (BID) | RECTAL | Status: DC
  Administered 2017-09-17 – 2017-09-19 (×4): 25 mg via RECTAL
  Filled 2017-09-17 (×4): qty 1

## 2017-09-17 MED ORDER — VANCOMYCIN HCL 500 MG IV SOLR
INTRAVENOUS | Status: AC
  Filled 2017-09-17: qty 500

## 2017-09-17 MED ORDER — SODIUM CHLORIDE 0.9 % IV SOLN: 100.0000 mL | INTRAVENOUS | Status: DC | PRN

## 2017-09-17 MED ORDER — COLLAGENASE 250 UNIT/GM EX OINT
TOPICAL_OINTMENT | Freq: Every day | CUTANEOUS | Status: DC
  Administered 2017-09-17 – 2017-09-19 (×3): via TOPICAL
  Filled 2017-09-17: qty 30

## 2017-09-17 MED ORDER — LIDOCAINE HCL (PF) 1 % IJ SOLN: 5.0000 mL | INTRAMUSCULAR | Status: DC | PRN

## 2017-09-17 NOTE — Progress Notes (Signed)
PROGRESS NOTE                                                                                                                                                                                                             Patient Demographics:    Destiny Boyd, is a 84 y.o. female, DOB - 04/20/1934, UDJ:[id number]  Admit date - 09/16/2017   Admitting Physician Jani Gravel, MD  Outpatient Primary MD for the patient is Rosita Fire, MD  LOS - 1  Outpatient Specialists: Nephrology  Chief Complaint  Patient presents with  . Irregular Heart Beat       Brief Narrative   84 year old female with history of hypertension, diabetes mellitus, A. fib on Coumadin, end-stage renal disease on dialysis (Tuesdays ,Thursdays and Saturday) sent by her home health nurse  after she was found to be tachycardic.  Patient reports that since yesterday she was feeling extremely weak.  Denies any fevers, chills, nausea, vomiting, chest pain, palpitations, shortness of breath, abdominal pain, dysuria (make some urine) or diarrhea. She was found to be in rapid A. fib in the ED, responded to IV Lopressor and admitted to hospitalist service on telemetry.  Blood work showed elevated WBC (19 K, mildly elevated lactate with no source of infection)    Subjective:   Patient reports feeling slightly better today.  Heart rate frequently in 120s on the monitor.   Assessment  & Plan :   principle problem Atrial fibrillation with RVR (HCC) No clear etiology.  Has elevated WBC and mildly elevated lactate without source of infection.  Currently on empiric vancomycin and Zosyn (will discontinue if blood cultures on admission negative). Continue home dose Cardizem (to 40 mg a.m. and 120 mg p.m.), switch atenolol to metoprolol 50 mg twice daily and adjust dose as needed.  Coumadin dosing per pharmacy. TSH normal. 2D echo with LVH and normal EF of 60-65% without wall  motion abnormality. If no improvement despite adjusting medication I will consult cardiology.   active Problems:   ESRD on dialysis [facility]) Nephrology following and will have dialysis today.    Abnormal liver function Mild.  Resolved in a.m. lab.  Ultrasound abdomen shows multiple tiny gallstones with gallbladder sludge.  Given mild transaminitis that has resolved and absence of fever or right upper quadrant tenderness I think acute cholangitis or cholecystitis is  unlikely.  Will monitor clinically.     Elevated troponin Suspect secondary to ESRD and A. fib.  2D echo reviewed.  No chest pain symptoms.  Continue beta-blocker.  Diabetes mellitus type II CBG stable.  Continue linagliptin and sliding scale coverage.    Pressure injury of skin Over left anterior tibia.  Care per nursing  Anemia of chronic disease      Code Status : Full code  Family Communication  : None at bedside  Disposition Plan  : Home once improved  Barriers For Discharge : Active symptoms  Consults  : None  Procedures  : 2D echo Ultrasound abdomen  DVT Prophylaxis  : Coumadin  Lab Results  Component Value Date   PLT 322 09/17/2017    Antibiotics  :    Anti-infectives (From admission, onward)   Start     Dose/Rate Route Frequency Ordered Stop   09/17/17 1200  vancomycin (VANCOCIN) 500 mg in sodium chloride 0.9 % 100 mL IVPB     500 mg 100 mL/hr over 60 Minutes Intravenous Every T-Th-Sa (Hemodialysis) 09/16/17 1546     09/16/17 2200  piperacillin-tazobactam (ZOSYN) IVPB 3.375 g     3.375 g 12.5 mL/hr over 240 Minutes Intravenous Every 12 hours 09/16/17 1546     09/16/17 1330  piperacillin-tazobactam (ZOSYN) IVPB 3.375 g     3.375 g 100 mL/hr over 30 Minutes Intravenous  Once 09/16/17 1325 09/16/17 1414   09/16/17 1330  vancomycin (VANCOCIN) IVPB 1000 mg/200 mL premix     1,000 mg 200 mL/hr over 60 Minutes Intravenous  Once 09/16/17 1325 09/16/17 2004        Objective:   Vitals:    09/16/17 1713 09/16/17 2041 09/16/17 2109 09/17/17 0516  BP: (!) 113/58  (!) 118/58 127/68  Pulse: 83  88 (!) 59  Resp: 16  16 16   Temp: 97.8 F (36.6 C)  99.4 F (37.4 C) 98.2 F (36.8 C)  TempSrc: Oral  Oral Oral  SpO2: 98% 98% 98% 93%  Weight: 55.9 kg (123 lb 3.2 oz)     Height: 5\' 4"  (1.626 m)       Wt Readings from Last 3 Encounters:  09/16/17 55.9 kg (123 lb 3.2 oz)  09/08/17 55.3 kg (122 lb)  09/03/17 53.1 kg (117 lb 1 oz)     Intake/Output Summary (Last 24 hours) at 09/17/2017 1330 Last data filed at 09/17/2017 0600 Gross per 24 hour  Intake 710 ml  Output -  Net 710 ml     Physical Exam  Gen: not in distress HEENT: Pallor present moist mucosa, supple neck Chest: clear b/l, no added sounds CVS: S1&S2 tachycardic and irregular, no murmurs, rubs or gallop GI: soft, NT, ND, BS+ Musculoskeletal: warm, no edema, left upper extremity AV fistula CNS: AAOX3, non focal    Data Review:    CBC Recent Labs  Lab 09/16/17 1205 09/17/17 0558  WBC 18.3* 19.0*  HGB 9.3* 8.7*  HCT 31.5* 28.7*  PLT 315 322  MCV 89.2 87.5  MCH 26.3 26.5  MCHC 29.5* 30.3  RDW 15.3 15.1  LYMPHSABS 1.1  --   MONOABS 1.4*  --   EOSABS 0.1  --   BASOSABS 0.0  --     Chemistries  Recent Labs  Lab 09/16/17 1205 09/17/17 0558  NA 136 137  K 4.6 4.9  CL 95* 95*  CO2 25 26  GLUCOSE 242* 160*  BUN 38* 47*  CREATININE 5.77* 6.74*  CALCIUM 9.1 9.3  AST 49* 32  ALT 32 27  ALKPHOS 136* 117  BILITOT 0.7 0.9   ------------------------------------------------------------------------------------------------------------------ No results for input(s): CHOL, HDL, LDLCALC, TRIG, CHOLHDL, LDLDIRECT in the last 72 hours.  Lab Results  Component Value Date   HGBA1C 6.6 (H) 08/31/2017   ------------------------------------------------------------------------------------------------------------------ Recent Labs    09/16/17 1818  TSH 2.151    ------------------------------------------------------------------------------------------------------------------ No results for input(s): VITAMINB12, FOLATE, FERRITIN, TIBC, IRON, RETICCTPCT in the last 72 hours.  Coagulation profile Recent Labs  Lab 09/14/17 09/16/17 1205 09/17/17 0558  INR 4.0 2.73 3.16    No results for input(s): DDIMER in the last 72 hours.  Cardiac Enzymes Recent Labs  Lab 09/16/17 1818 09/16/17 2347 09/17/17 0558  TROPONINI 0.06* 0.06* 0.06*   ------------------------------------------------------------------------------------------------------------------ No results found for: BNP  Inpatient Medications  Scheduled Meds: . amLODipine  10 mg Oral Daily  . aspirin EC  81 mg Oral Daily  . atorvastatin  80 mg Oral q1800  . collagenase   Topical Daily  . diltiazem  240 mg Oral Daily   And  . diltiazem  120 mg Oral QHS  . furosemide  40 mg Oral Daily  . hydrALAZINE  50 mg Oral BID  . insulin aspart  0-5 Units Subcutaneous QHS  . insulin aspart  0-9 Units Subcutaneous TID WC  . linagliptin  5 mg Oral Daily  . metoprolol tartrate  50 mg Oral BID  . pantoprazole  40 mg Oral Daily  . sevelamer carbonate  3,200 mg Oral TID WC  . sodium chloride flush  3 mL Intravenous Q12H  . traMADol  100 mg Oral QHS  . Warfarin - Pharmacist Dosing Inpatient   Does not apply q1800   Continuous Infusions: . sodium chloride 250 mL (09/17/17 0600)  . piperacillin-tazobactam (ZOSYN)  IV 3.375 g (09/17/17 1106)  . vancomycin     PRN Meds:.sodium chloride, acetaminophen **OR** acetaminophen, diltiazem, oxyCODONE-acetaminophen, sodium chloride flush  Micro Results Recent Results (from the past 240 hour(s))  Culture, blood (Routine x 2)     Status: None (Preliminary result)   Collection Time: 09/16/17 12:45 PM  Result Value Ref Range Status   Specimen Description BLOOD RIGHT FOREARM  Final   Special Requests   Final    BOTTLES DRAWN AEROBIC AND ANAEROBIC Blood  Culture adequate volume   Culture NO GROWTH < 24 HOURS  Final   Report Status PENDING  Incomplete  Culture, blood (Routine x 2)     Status: None (Preliminary result)   Collection Time: 09/16/17 12:45 PM  Result Value Ref Range Status   Specimen Description BLOOD RIGHT HAND  Final   Special Requests   Final    BOTTLES DRAWN AEROBIC ONLY Blood Culture results may not be optimal due to an inadequate volume of blood received in culture bottles   Culture NO GROWTH < 24 HOURS  Final   Report Status PENDING  Incomplete    Radiology Reports Dg Chest 2 View  Result Date: 09/17/2017 CLINICAL DATA:  Possible left-sided lung nodule EXAM: CHEST  2 VIEW COMPARISON:  09/16/2017 FINDINGS: Cardiac shadow is enlarged but stable. Aortic calcifications are again seen. The lungs are hyper aerated without focal infiltrate. Minimal scarring is noted in the left mid lung. Previously seen density is related to the EKG lead. No acute abnormality is noted. IMPRESSION: COPD without acute abnormality. Previously seen density was related to the overlying extrinsic artifact. Electronically Signed   By: Inez Catalina M.D.   On: 09/17/2017 09:05  Dg Chest 2 View  Result Date: 09/16/2017 CLINICAL DATA:  Hypertension and tachycardia. EXAM: CHEST  2 VIEW COMPARISON:  01/24/2017. FINDINGS: Trachea is midline. Heart is enlarged. Thoracic aorta is calcified. There may be a calcified granuloma in the right upper lobe. Small density in the left midlung zone likely relates to the EKG lead. Linear scarring in the lingula. No pleural fluid. Lungs are otherwise clear. IMPRESSION: 1. Small density in the left midlung zone is likely related to the EKG lead. Difficult to exclude a pulmonary nodule. Consider PA and lateral views of the chest in the nonacute setting, in further evaluation, as clinically indicated. 2.  Aortic atherosclerosis (ICD10-170.0). Electronically Signed   By: Lorin Picket M.D.   On: 09/16/2017 12:40   US Abdomen  Limited Ruq  Result Date: 09/17/2017 CLINICAL DATA:  Elevated LFTs. EXAM: ULTRASOUND ABDOMEN LIMITED RIGHT UPPER QUADRANT COMPARISON: CT 10/25/2013. FINDINGS: Gallbladder: Gallstones measuring up 6 mm noted. Sludge noted. Gallbladder wall thickness 2.4 mm. Negative Murphy sign. Common bile duct: Diameter: 2.4 mm Liver: No focal lesion identified. Within normal limits in parenchymal echogenicity. Portal vein is patent on color Doppler imaging with normal direction of blood flow towards the liver. Hepatic veins and IVC prominent. This could be related to a cardiac process including congestive heart failure. Increased right renal echogenicity is incidentally noted consistent chronic medical renal disease. Simple right renal cysts incidentally noted, the largest visualized measures 3.2 cm. IMPRESSION: 1.  Multiple tiny gallstones measuring up 6 mm.  Gallbladder sludge. 2. Prominence of the patent veins and IVC noted. This may be secondary to a cardiac process including CHF. 3. Increased right renal echogenicity is incidentally noted consistent chronic medical renal disease. Simple right renal cysts are again noted. Electronically Signed   By: Marcello Moores  Register   On: 09/17/2017 09:01    Time Spent in minutes  25   Tida Saner M.D on 09/17/2017 at 1:30 PM  Between 7am to 7pm - Pager - [phone]  After 7pm go to [email or web address] - password [facility]  [facility] -  Office  [phone]

## 2017-09-17 NOTE — Consult Note (Signed)
Reason for Consult: End-stage renal disease Referring Physician: Dr. Burnett Sheng Destiny Boyd is an 84 y.o. female.  HPI: She is a patient who has history of hypertension, diabetes, atrial fibrillation, end-stage renal disease on maintenance hemodialysis presently came because of palpitation.  When she was evaluated in the emergency room she was found to have atrial fibrillation with aVR admitted to the hospital.  Patient presently is feeling better.  She denies any difficulty breathing, no chest pain.  Past Medical History:  Diagnosis Date  . 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 Colon cancer ([place])    cecum cancer  . Diabetes mellitus   . ESRD (end stage renal disease) ([place])   . Full dentures   . GERD (gastroesophageal reflux disease)   . Gout   . Hypercholesteremia   . Hypertension   . Iron deficiency anemia   . Leukopenia   . PAF (paroxysmal atrial fibrillation) (HCC)    a. on Coumadin for anticoagulation  . Peripheral vascular disease ([place])   . Stroke ([place]) 2005  . Wears glasses     Past Surgical History:  Procedure Laterality Date  . ABDOMINAL AORTOGRAM W/LOWER EXTREMITY N/A 08/27/2017   Procedure: ABDOMINAL AORTOGRAM W/LOWER EXTREMITY;  Surgeon: Conrad Cary, MD;  Location: [facility];  Service: Cardiovascular;  Laterality: N/A;  . ABDOMINAL HYSTERECTOMY    . AV FISTULA PLACEMENT Left 08/24/2013   Procedure: ARTERIOVENOUS (AV) FISTULA CREATION- LEFT BRACHIAL CEPHALIC;  Surgeon: Conrad Hillsdale, MD;  Location: [place];  Service: Vascular;  Laterality: Left;  . BIOPSY OF SKIN SUBCUTANEOUS TISSUE AND/OR MUCOUS MEMBRANE Left 08/31/2017   Procedure: BIOPSY OF SKIN SUBCUTANEOUS TISSUE  LEFT CALF;  Surgeon: Conrad Shaft, MD;  Location: [place];  Service: Vascular;  Laterality: Left;  . CATARACT EXTRACTION, BILATERAL     lens implants  . COLON SURGERY  2010  . COLONOSCOPY  2008   Dr. Oneida Alar: 3-4 cm cecal polyp partially removed, path noting cecal adenocarcinoma, underwent right  hemicolectomy with TI resection  . COLONOSCOPY  2009   Dr. Oneida Alar: 67m sessile rectal polyp (benign), rare diverticula  . ESOPHAGOGASTRODUODENOSCOPY  2008   Dr. FOneida Alar normal esophagus  . ESOPHAGOGASTRODUODENOSCOPY  2009   Dr. FOneida Alar multiple antral erosions (reactive gastropathy), normal duodenum, normal esophagus  . EYE SURGERY    . GIVENS CAPSULE STUDY  2009   normal   . HEMICOLECTOMY  2008  . ILIAC ATHERECTOMY Left 08/31/2017   Procedure: ORBITAL ATHERECTOMY of Left Superficial Femoral  and Peroneal  Artery. Drug Coated ANGIOPLASTY of Left Superficial Femoral Artery. Angioplasty of Peroneal Artery;  Surgeon: CConrad Hayfork MD;  [place]  Service: Vascular;  Laterality: Left;  . LOWER EXTREMITY ANGIOGRAM  08/31/2017  . LOWER EXTREMITY ANGIOGRAM Left 08/31/2017   Procedure: LOWER EXTREMITY ANGIOGRAM LEFT LEG RUNOFF;  Surgeon: CConrad Sumner MD;  [place]  Service: Vascular;  Laterality: Left;  .Marland KitchenMULTIPLE TOOTH EXTRACTIONS    . PERIPHERAL VASCULAR CATHETERIZATION N/A 03/03/2016   Procedure: Fistulagram;  Surgeon: CAngelia Mould MD;  [facility];  Service: Cardiovascular;  Laterality: N/A;  . PERIPHERAL VASCULAR CATHETERIZATION Left 03/03/2016   Procedure: Peripheral Vascular Balloon Angioplasty;  Surgeon: CAngelia Mould MD;  [facility];  Service: Cardiovascular;  Laterality: Left;  arm fistula pta    Family History  Problem Relation Age of Onset  . Diabetes Mother   . Hypertension Mother   . Hyperlipidemia Daughter   .  Varicose Veins Daughter   . Cancer Son   . Diabetes Son   . Heart disease Son   . Hyperlipidemia Son     Social History:  reports that she quit smoking about 32 years ago. Her smoking use included cigarettes. She has a 10.00 pack-year smoking history. she has never used smokeless tobacco. She reports that she does not drink alcohol or use drugs.  Allergies: No Known Allergies  Medications: I have  reviewed the patient's current medications.  Results for orders placed or performed during the hospital encounter of 09/16/17 (from the past 48 hour(s))  Comprehensive metabolic panel     Status: Abnormal   Collection Time: 09/16/17 12:05 PM  Result Value Ref Range   Sodium 136 135 - 145 mmol/L   Potassium 4.6 3.5 - 5.1 mmol/L   Chloride 95 (L) 101 - 111 mmol/L   CO2 25 22 - 32 mmol/L   Glucose, Bld 242 (H) 65 - 99 mg/dL   BUN 38 (H) 6 - 20 mg/dL   Creatinine, Ser 5.77 (H) 0.44 - 1.00 mg/dL   Calcium 9.1 8.9 - 10.3 mg/dL   Total Protein 7.6 6.5 - 8.1 g/dL   Albumin 2.6 (L) 3.5 - 5.0 g/dL   AST 49 (H) 15 - 41 U/L   ALT 32 14 - 54 U/L   Alkaline Phosphatase 136 (H) 38 - 126 U/L   Total Bilirubin 0.7 0.3 - 1.2 mg/dL   GFR calc non Af Amer 6 (L) >60 mL/min   GFR calc Af Amer 7 (L) >60 mL/min    Comment: (NOTE) The eGFR has been calculated using the CKD EPI equation. This calculation has not been validated in all clinical situations. eGFR's persistently <60 mL/min signify possible Chronic Kidney Disease.    Anion gap 16 (H) 5 - 15  CBC with Differential     Status: Abnormal   Collection Time: 09/16/17 12:05 PM  Result Value Ref Range   WBC 18.3 (H) 4.0 - 10.5 K/uL   RBC 3.53 (L) 3.87 - 5.11 MIL/uL   Hemoglobin 9.3 (L) 12.0 - 15.0 g/dL   HCT 31.5 (L) 36.0 - 46.0 %   MCV 89.2 78.0 - 100.0 fL   MCH 26.3 26.0 - 34.0 pg   MCHC 29.5 (L) 30.0 - 36.0 g/dL   RDW 15.3 11.5 - 15.5 %   Platelets 315 150 - 400 K/uL   Neutrophils Relative % 86 %   Neutro Abs 15.7 (H) 1.7 - 7.7 K/uL   Lymphocytes Relative 6 %   Lymphs Abs 1.1 0.7 - 4.0 K/uL   Monocytes Relative 7 %   Monocytes Absolute 1.4 (H) 0.1 - 1.0 K/uL   Eosinophils Relative 1 %   Eosinophils Absolute 0.1 0.0 - 0.7 K/uL   Basophils Relative 0 %   Basophils Absolute 0.0 0.0 - 0.1 K/uL  Protime-INR     Status: Abnormal   Collection Time: 09/16/17 12:05 PM  Result Value Ref Range   Prothrombin Time 28.7 (H) 11.4 - 15.2 seconds    INR 2.73   Troponin I     Status: Abnormal   Collection Time: 09/16/17 12:05 PM  Result Value Ref Range   Troponin I 0.08 (HH) <0.03 ng/mL    Comment: CRITICAL RESULT CALLED TO, READ BACK BY AND VERIFIED WITH: KENDRICK,J AT 1320 ON 1.30.19 BY ISLEY,B   I-Stat CG4 Lactic Acid, ED     Status: Abnormal   Collection Time: 09/16/17 12:20 PM  Result Value Ref  Range   Lactic Acid, Venous 3.68 (HH) 0.5 - 1.9 mmol/L   Comment NOTIFIED PHYSICIAN   Culture, blood (Routine x 2)     Status: None (Preliminary result)   Collection Time: 09/16/17 12:45 PM  Result Value Ref Range   Specimen Description BLOOD RIGHT FOREARM    Special Requests      BOTTLES DRAWN AEROBIC AND ANAEROBIC Blood Culture adequate volume   Culture NO GROWTH < 24 HOURS    Report Status PENDING   Culture, blood (Routine x 2)     Status: None (Preliminary result)   Collection Time: 09/16/17 12:45 PM  Result Value Ref Range   Specimen Description BLOOD RIGHT HAND    Special Requests      BOTTLES DRAWN AEROBIC ONLY Blood Culture results may not be optimal due to an inadequate volume of blood received in culture bottles   Culture NO GROWTH < 24 HOURS    Report Status PENDING   I-Stat CG4 Lactic Acid, ED     Status: None   Collection Time: 09/16/17  3:16 PM  Result Value Ref Range   Lactic Acid, Venous 1.44 0.5 - 1.9 mmol/L  Glucose, capillary     Status: Abnormal   Collection Time: 09/16/17  6:08 PM  Result Value Ref Range   Glucose-Capillary 203 (H) 65 - 99 mg/dL  Troponin I (q 6hr x 3)     Status: Abnormal   Collection Time: 09/16/17  6:18 PM  Result Value Ref Range   Troponin I 0.06 (HH) <0.03 ng/mL    Comment: CRITICAL VALUE NOTED.  VALUE IS CONSISTENT WITH PREVIOUSLY REPORTED AND CALLED VALUE.  TSH     Status: None   Collection Time: 09/16/17  6:18 PM  Result Value Ref Range   TSH 2.151 0.350 - 4.500 uIU/mL    Comment: Performed by a 3rd Generation assay with a functional sensitivity of <=0.01 uIU/mL.  Glucose,  capillary     Status: Abnormal   Collection Time: 09/16/17  8:59 PM  Result Value Ref Range   Glucose-Capillary 146 (H) 65 - 99 mg/dL   Comment 1 Notify RN    Comment 2 Document in Chart   Troponin I (q 6hr x 3)     Status: Abnormal   Collection Time: 09/16/17 11:47 PM  Result Value Ref Range   Troponin I 0.06 (HH) <0.03 ng/mL    Comment: CRITICAL RESULT CALLED TO, READ BACK BY AND VERIFIED WITH: MARTIN,J @ 0140 ON 1.31.19 BY BOWMAN,L   Troponin I (q 6hr x 3)     Status: Abnormal   Collection Time: 09/17/17  5:58 AM  Result Value Ref Range   Troponin I 0.06 (HH) <0.03 ng/mL    Comment: CRITICAL VALUE NOTED.  VALUE IS CONSISTENT WITH PREVIOUSLY REPORTED AND CALLED VALUE.  CBC     Status: Abnormal   Collection Time: 09/17/17  5:58 AM  Result Value Ref Range   WBC 19.0 (H) 4.0 - 10.5 K/uL   RBC 3.28 (L) 3.87 - 5.11 MIL/uL   Hemoglobin 8.7 (L) 12.0 - 15.0 g/dL   HCT 28.7 (L) 36.0 - 46.0 %   MCV 87.5 78.0 - 100.0 fL   MCH 26.5 26.0 - 34.0 pg   MCHC 30.3 30.0 - 36.0 g/dL   RDW 15.1 11.5 - 15.5 %   Platelets 322 150 - 400 K/uL  Comprehensive metabolic panel     Status: Abnormal   Collection Time: 09/17/17  5:58 AM  Result Value Ref  Range   Sodium 137 135 - 145 mmol/L   Potassium 4.9 3.5 - 5.1 mmol/L   Chloride 95 (L) 101 - 111 mmol/L   CO2 26 22 - 32 mmol/L   Glucose, Bld 160 (H) 65 - 99 mg/dL   BUN 47 (H) 6 - 20 mg/dL   Creatinine, Ser 6.74 (H) 0.44 - 1.00 mg/dL   Calcium 9.3 8.9 - 10.3 mg/dL   Total Protein 7.0 6.5 - 8.1 g/dL   Albumin 2.3 (L) 3.5 - 5.0 g/dL   AST 32 15 - 41 U/L   ALT 27 14 - 54 U/L   Alkaline Phosphatase 117 38 - 126 U/L   Total Bilirubin 0.9 0.3 - 1.2 mg/dL   GFR calc non Af Amer 5 (L) >60 mL/min   GFR calc Af Amer 6 (L) >60 mL/min    Comment: (NOTE) The eGFR has been calculated using the CKD EPI equation. This calculation has not been validated in all clinical situations. eGFR's persistently <60 mL/min signify possible Chronic Kidney Disease.     Anion gap 16 (H) 5 - 15  Protime-INR     Status: Abnormal   Collection Time: 09/17/17  5:58 AM  Result Value Ref Range   Prothrombin Time 32.2 (H) 11.4 - 15.2 seconds   INR 3.16   Glucose, capillary     Status: Abnormal   Collection Time: 09/17/17  7:33 AM  Result Value Ref Range   Glucose-Capillary 145 (H) 65 - 99 mg/dL    Dg Chest 2 View  Result Date: 09/16/2017 CLINICAL DATA:  Hypertension and tachycardia. EXAM: CHEST  2 VIEW COMPARISON:  01/24/2017. FINDINGS: Trachea is midline. Heart is enlarged. Thoracic aorta is calcified. There may be a calcified granuloma in the right upper lobe. Small density in the left midlung zone likely relates to the EKG lead. Linear scarring in the lingula. No pleural fluid. Lungs are otherwise clear. IMPRESSION: 1. Small density in the left midlung zone is likely related to the EKG lead. Difficult to exclude a pulmonary nodule. Consider PA and lateral views of the chest in the nonacute setting, in further evaluation, as clinically indicated. 2.  Aortic atherosclerosis (ICD10-170.0). Electronically Signed   By: Lorin Picket M.D.   On: 09/16/2017 12:40    Review of Systems  Constitutional: Negative for chills.  Respiratory: Negative for cough and shortness of breath.   Cardiovascular: Positive for palpitations. Negative for chest pain and orthopnea.  Gastrointestinal: Negative for nausea and vomiting.  Neurological: Negative for weakness.   Blood pressure 127/68, pulse (!) 59, temperature 98.2 F (36.8 C), temperature source Oral, resp. rate 16, height _0  (1.626 m), weight 55.9 kg (123 lb 3.2 oz), SpO2 93 %. Physical Exam  Constitutional: She is oriented to person, place, and time. No distress.  Neck: No JVD present.  Cardiovascular:  Irregular rate and rhythm  Respiratory: No respiratory distress. She has no wheezes.  GI: There is no tenderness. There is no rebound.  Neurological: She is alert and oriented to person, place, and time.     Assessment/Plan: 1] atrial fibrillation: Her heart rate is presently controlled and patient is a symptomatic. 2] end-stage renal disease: She is status post hemodialysis on Tuesday.  Her potassium is normal.  She is due for dialysis today. 3] hypertension: Her blood pressure is reasonably controlled 4] diabetes 5] anemia: Her hemoglobin remains within our target goal. 6] bone and mineral disorder: Her calcium is range.  Patient is on a binder.  Her  phosphorus level was high as an outpatient but presently there is no new blood work. 7] leukocytosis: Patient presently afebrile.  Etiology not clear.  Patient however had some wound on her foot which has healed. Plan: We will make arrangement for patient to get dialysis today 2] we will check a renal panel and CBC in the morning.  Keonte Daubenspeck S 09/17/2017, 8:19 AM

## 2017-09-17 NOTE — Consult Note (Signed)
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  Has been seen by vascular with resulting aortogram, bilateral leg runoff. Wounds remain with <90% devitalized tissue and may benefit from debridement. WBC 19.0  Will recommend surgical consult.   Wound type:calciphylaxis. Painful Pressure Injury POA: NA Measurement:Right ankle:  5 cm x 3 cm eschar Left lateral leg: 23cm x 7 cm wound bed is 100% devitalized tissue Wound bed: devitalized tissue Drainage (amount, consistency, odor) necrotic odor serosanguinous drainage Periwound:Dry skin Dressing procedure/placement/frequency:Cleanse wounds to right ankle and left leg with NS and pat dry.  Apply Santyl to wound bed.  Cover with NS moist gauze.  Cover with dry 4x4 and kerlix/tape.  Albumin is very low. Impaired healing Will not follow at this time.  Please re-consult if needed.  Domenic Moras RN BSN [place] Pager [phone]

## 2017-09-17 NOTE — Patient Outreach (Signed)
  [facility]) Care Management  09/17/2017  Destiny Boyd Jul 11, 1934 [id number]   Care coordination of care  Community CM received a copy of the [facility] pharmacy note from 09/15/17 indicating -As patient's daughter denies other pharmacy related needs, will close pharmacy episode. Patient's daughter provided with [facility] Pharmacist phone number if new needs arise.   09/17/17 Destiny Boyd has been noted to re admit back to hospital after Home health nurse recommendation after an irregular heart beat was assessed on 09/16/17  Admitting dx rapid atrial fibrillation, troponin elevation abnormal liver function and possible early sepsis   Plan Community CM will re engage with Destiny Boyd and her daughter, Destiny Boyd for home visit after discharge for continued transition of care services   Destiny Boyd L. Lavina Hamman, RN, BSN, [place] Coordinator [phone] week day mobile

## 2017-09-17 NOTE — Progress Notes (Signed)
*  PRELIMINARY RESULTS* Echocardiogram 2D Echocardiogram has been performed.  Destiny Boyd 09/17/2017, 11:16 AM

## 2017-09-17 NOTE — Progress Notes (Signed)
CRITICAL VALUE ALERT  Critical Value: Troponin 0.06  Date & Time Notied:  09/17/2017 0200  Provider Notified: P. Manuella Ghazi  Orders Received/Actions taken: No new orders received at this time.

## 2017-09-17 NOTE — Procedures (Signed)
     HEMODIALYSIS TREATMENT NOTE:  3.5 hour heparin-free dialysis completed via left upper arm AVF (16g/antegrade). Goal NOT met: Unable to tolerate removal of 1.5L as ordered.  Pt became tachycardic 150s two and a half hours into HD session.  Ultrafiltration rate was decreased, then stopped as she also became hypotensive (asymptomatic).  Net UF 1019cc.  BP 125/73, P156 post-dialysis. Primary RN administered scheduled Diltiazem and Metoprolol.  All blood was returned and hemostasis was achieved within 10 minutes. Report given to Kearney Hard, RN.  Rockwell Alexandria, RN, CDN

## 2017-09-17 NOTE — Progress Notes (Addendum)
ANTICOAGULATION CONSULT NOTE -  Pharmacy Consult for Warfarin Indication: atrial fibrillation  No Known Allergies  Patient Measurements: Height: 5\' 4"  (162.6 cm) Weight: 123 lb 3.2 oz (55.9 kg) IBW/kg (Calculated) : 54.7   Vital Signs: Temp: 98.2 F (36.8 C) (01/31 0516) Temp Source: Oral (01/31 0516) BP: 127/68 (01/31 0516) Pulse Rate: 59 (01/31 0516)  Labs: Recent Labs    09/16/17 1205 09/16/17 1818 09/16/17 2347 09/17/17 0558  HGB 9.3*  --   --  8.7*  HCT 31.5*  --   --  28.7*  PLT 315  --   --  322  LABPROT 28.7*  --   --  32.2*  INR 2.73  --   --  3.16  CREATININE 5.77*  --   --  6.74*  TROPONINI 0.08* 0.06* 0.06* 0.06*    Estimated Creatinine Clearance: 5.7 mL/min (A) (by C-G formula based on SCr of 6.74 mg/dL (H)).   Medical History: Past Medical History:  Diagnosis Date  . 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 Colon cancer ([place])    cecum cancer  . Diabetes mellitus   . ESRD (end stage renal disease) ([place])   . Full dentures   . GERD (gastroesophageal reflux disease)   . Gout   . Hypercholesteremia   . Hypertension   . Iron deficiency anemia   . Leukopenia   . PAF (paroxysmal atrial fibrillation) (HCC)    a. on Coumadin for anticoagulation  . Peripheral vascular disease ([place])   . Stroke ([place]) 2005  . Wears glasses     Medications:  Medications Prior to Admission  Medication Sig Dispense Refill Last Dose  . amLODipine (NORVASC) 10 MG tablet Take 1 tablet by mouth daily.   09/16/2017 at Unknown time  . aspirin EC 81 MG tablet Take 81 mg by mouth daily.   09/16/2017 at Unknown time  . atenolol (TENORMIN) 100 MG tablet Take 1 tablet by mouth daily.   09/16/2017 at Unknown time  . diltiazem (CARDIZEM CD) 120 MG 24 hr capsule Take 240 mg ( 2 tablets ) in th am, and take 120 mg ( 1 tablet)  in the pm (Patient taking differently: Take 120-240 mg by mouth See admin instructions. Take 240 mg ( 2 tablets ) in th am, and take 120 mg ( 1 tablet)  in the pm) 90  capsule 3 09/16/2017 at Unknown time  . furosemide (LASIX) 40 MG tablet Take 1 tablet by mouth daily.   09/16/2017 at Unknown time  . hydrALAZINE (APRESOLINE) 50 MG tablet Take 1 tablet by mouth 2 (two) times daily.   09/16/2017 at Unknown time  . multivitamin (RENA-VIT) TABS tablet Take 1 tablet by mouth daily.   09/16/2017 at Unknown time  . oxyCODONE-acetaminophen (PERCOCET/ROXICET) 5-325 MG tablet Take 1 tablet by mouth every 6 (six) hours as needed for moderate pain. 10 tablet 0 09/15/2017 at Unknown time  . pantoprazole (PROTONIX) 40 MG tablet Take 1 tablet (40 mg total) by mouth daily. 30 tablet 3 09/16/2017 at Unknown time  . sevelamer carbonate (RENVELA) 800 MG tablet Take 3,200 mg by mouth 3 (three) times daily with meals. & 1600 by mouth with snacks   09/16/2017 at Unknown time  . TRADJENTA 5 MG TABS tablet Take 1 tablet by mouth daily.   09/16/2017 at Unknown time  . traMADol (ULTRAM) 50 MG tablet Take 100 mg by mouth at bedtime.    Past Week at Unknown time  . warfarin (COUMADIN) 2.5 MG tablet Take  2.5-5 mg by mouth. Per 09/16/17 warfarin note in chart:  2.5 mg Monday, Wednesday and Friday, and 5 mg Tuesday, Thursday, Saturday and sunday   Past Week at Unknown time  . diltiazem (TIAZAC) 120 MG 24 hr capsule Take 120 mg by mouth 2 (two) times daily.  3     Assessment: 5 yof on coumadin for afib. S/P LLE angiogram/calf biopsy on 1/14.  INR currently supratherapeutic at 3.16 and trending up . Last anti-coag visit on 1/28 had patient taking 2.5 mg MWF and 5 mg ROW.   Goal of Therapy:  INR 2-3 Monitor platelets by anticoagulation protocol: Yes   Plan:  HOLD warfarin today x1 day Daily INR, CBC every 3 days  Margot Ables, PharmD Clinical Pharmacist 09/17/2017 8:35 AM

## 2017-09-18 DIAGNOSIS — K649 Unspecified hemorrhoids: Secondary | ICD-10-CM

## 2017-09-18 DIAGNOSIS — I480 Paroxysmal atrial fibrillation: Principal | ICD-10-CM

## 2017-09-18 DIAGNOSIS — Z48812 Encounter for surgical aftercare following surgery on the circulatory system: Secondary | ICD-10-CM | POA: Diagnosis not present

## 2017-09-18 LAB — RENAL FUNCTION PANEL
Albumin: 2.1 g/dL — ABNORMAL LOW (ref 3.5–5.0)
Anion gap: 11 (ref 5–15)
BUN: 23 mg/dL — ABNORMAL HIGH (ref 6–20)
CALCIUM: 8.4 mg/dL — AB (ref 8.9–10.3)
CHLORIDE: 93 mmol/L — AB (ref 101–111)
CO2: 30 mmol/L (ref 22–32)
CREATININE: 3.8 mg/dL — AB (ref 0.44–1.00)
GFR calc Af Amer: 12 mL/min — ABNORMAL LOW (ref >60)
GFR calc non Af Amer: 10 mL/min — ABNORMAL LOW (ref >60)
GLUCOSE: 256 mg/dL — AB (ref 65–99)
Phosphorus: 2.9 mg/dL (ref 2.5–4.6)
Potassium: 4.3 mmol/L (ref 3.5–5.1)
SODIUM: 134 mmol/L — AB (ref 135–145)

## 2017-09-18 LAB — CBC
HCT: 27.9 % — ABNORMAL LOW (ref 36.0–46.0)
Hemoglobin: 8.5 g/dL — ABNORMAL LOW (ref 12.0–15.0)
MCH: 26.6 pg (ref 26.0–34.0)
MCHC: 30.5 g/dL (ref 30.0–36.0)
MCV: 87.2 fL (ref 78.0–100.0)
PLATELETS: 350 10*3/uL (ref 150–400)
RBC: 3.2 MIL/uL — ABNORMAL LOW (ref 3.87–5.11)
RDW: 15.4 % (ref 11.5–15.5)
WBC: 14.8 10*3/uL — ABNORMAL HIGH (ref 4.0–10.5)

## 2017-09-18 LAB — GLUCOSE, CAPILLARY
GLUCOSE-CAPILLARY: 67 mg/dL (ref 65–99)
Glucose-Capillary: 211 mg/dL — ABNORMAL HIGH (ref 65–99)
Glucose-Capillary: 184 mg/dL — ABNORMAL HIGH (ref 65–99)
Glucose-Capillary: 90 mg/dL (ref 65–99)

## 2017-09-18 LAB — PROTIME-INR
INR: 3.77
Prothrombin Time: 36.9 seconds — ABNORMAL HIGH (ref 11.4–15.2)

## 2017-09-18 LAB — HEPATITIS PANEL, ACUTE
HCV Ab: <0.1 s/co ratio (ref 0.0–0.9)
Hep A IgM: NEGATIVE
Hep B C IgM: NEGATIVE
Hepatitis B Surface Ag: NEGATIVE

## 2017-09-18 LAB — MRSA PCR SCREENING: MRSA by PCR: NEGATIVE

## 2017-09-18 MED ORDER — EPOETIN ALFA 10000 UNIT/ML IJ SOLN
6000.0000 [IU] | INTRAMUSCULAR | Status: DC
  Administered 2017-09-19: 6000 [IU] via SUBCUTANEOUS
  Filled 2017-09-18: qty 1

## 2017-09-18 NOTE — Progress Notes (Signed)
PROGRESS NOTE                                                                                                                                                                                                             Patient Demographics:    Destiny Boyd, is a 84 y.o. female, DOB - [birth year], MVH:[id number]  Admit date - 09/16/2017   Admitting Physician Jani Gravel, MD  Outpatient Primary MD for the patient is Rosita Fire, MD  LOS - 2  Outpatient Specialists: Nephrology  Chief Complaint  Patient presents with  . Irregular Heart Beat       Brief Narrative   84 year old female with history of hypertension, diabetes mellitus, A. fib on Coumadin, end-stage renal disease on dialysis (Tuesdays ,Thursdays and Saturday) sent by her home health nurse  after she was found to be tachycardic.  Patient reports that since yesterday she was feeling extremely weak.  Denies any fevers, chills, nausea, vomiting, chest pain, palpitations, shortness of breath, abdominal pain, dysuria (make some urine) or diarrhea. She was found to be in rapid A. fib in the ED, responded to IV Lopressor and admitted to hospitalist service on telemetry.  Blood work showed elevated WBC (19 K, mildly elevated lactate with no source of infection)    Subjective:   Reports feeling better.  Heart rate better controlled overnight.   Assessment  & Plan :   principle problem Atrial fibrillation with RVR (HCC) No clear etiology.  Has elevated WBC and mildly elevated lactate without source of infection.  Blood cultures negative.  We will discontinue empiric antibiotics. Continue home dose Cardizem (240 mg a.m. and 120 mg p.m.) switched atenolol to metoprolol 50 mg twice daily.   Coumadin dosing per pharmacy.  TSH normal. 2D echo with normal EF of 60-65% without wall motion abnormality and shows LVH.    active Problems:   ESRD on dialysis [facility]) Nephrology  following.  Received dialysis yesterday.    Abnormal liver function Mild.  Resolved in a.m. lab.  Ultrasound abdomen shows multiple tiny gallstones with gallbladder sludge.  Given mild transaminitis that has resolved and absence of fever or right upper quadrant tenderness I think acute cholangitis or cholecystitis is unlikely.  Will monitor clinically.     Elevated troponin Suspect secondary to ESRD and A. fib.  2D echo reviewed.  No  chest pain symptoms.  Continue beta-blocker.  Diabetes mellitus type II CBG stable.  Continue linagliptin and sliding scale coverage.    Pressure injury of skin Over left anterior tibia.  Care per nursing  Anemia of chronic disease H&H stable.  Noted to have small rectal bleed yesterday likely hemorrhoidal.  Ordered anusol suppository.  Generalized weakness Seen by PT and recommends SNF.  Social work consulted.   Code Status : Full code  Family Communication  : None at bedside  Disposition Plan  : SNF once bed available.  Barriers For Discharge : Active symptoms  Consults  : None  Procedures  : 2D echo Ultrasound abdomen  DVT Prophylaxis  : Coumadin  Lab Results  Component Value Date   PLT 350 09/18/2017    Antibiotics  :    Anti-infectives (From admission, onward)   Start     Dose/Rate Route Frequency Ordered Stop   09/17/17 1200  vancomycin (VANCOCIN) 500 mg in sodium chloride 0.9 % 100 mL IVPB     500 mg 100 mL/hr over 60 Minutes Intravenous Every T-Th-Sa (Hemodialysis) 09/16/17 1546     09/16/17 2200  piperacillin-tazobactam (ZOSYN) IVPB 3.375 g     3.375 g 12.5 mL/hr over 240 Minutes Intravenous Every 12 hours 09/16/17 1546     09/16/17 1330  piperacillin-tazobactam (ZOSYN) IVPB 3.375 g     3.375 g 100 mL/hr over 30 Minutes Intravenous  Once 09/16/17 1325 09/16/17 1414   09/16/17 1330  vancomycin (VANCOCIN) IVPB 1000 mg/200 mL premix     1,000 mg 200 mL/hr over 60 Minutes Intravenous  Once 09/16/17 1325 09/16/17 2004         Objective:   Vitals:   09/17/17 2230 09/17/17 2245 09/18/17 0510 09/18/17 1431  BP: 120/60 125/73 (!) 95/59 (!) 93/41  Pulse: (!) 156 (!) 155 91 (!) 59  Resp:  16 16 16   Temp:  98.4 F (36.9 C) 99.1 F (37.3 C) 98.6 F (37 C)  TempSrc:  Oral Oral Oral  SpO2:  96% 98% 92%  Weight:  54.2 kg (119 lb 7.8 oz) 56.2 kg (123 lb 14.4 oz)   Height:        Wt Readings from Last 3 Encounters:  09/18/17 56.2 kg (123 lb 14.4 oz)  09/08/17 55.3 kg (122 lb)  09/03/17 53.1 kg (117 lb 1 oz)     Intake/Output Summary (Last 24 hours) at 09/18/2017 1620 Last data filed at 09/18/2017 0953 Gross per 24 hour  Intake 860 ml  Output 1019 ml  Net -159 ml     Physical Exam General: Elderly female not in distress HEENT: Pallor present, moist mucosa, supple neck Chest: Clear to auscultation bilaterally CVS: S1 and S2 regular, no murmurs rubs or gallop GI: Soft, nondistended, nontender Musculoskeletal: Warm, no edema, skin breakdown over left tibia, left upper extremity AV fistula    Data Review:    CBC Recent Labs  Lab 09/16/17 1205 09/17/17 0558 09/18/17 0518  WBC 18.3* 19.0* 14.8*  HGB 9.3* 8.7* 8.5*  HCT 31.5* 28.7* 27.9*  PLT 315 322 350  MCV 89.2 87.5 87.2  MCH 26.3 26.5 26.6  MCHC 29.5* 30.3 30.5  RDW 15.3 15.1 15.4  LYMPHSABS 1.1  --   --   MONOABS 1.4*  --   --   EOSABS 0.1  --   --   BASOSABS 0.0  --   --     Chemistries  Recent Labs  Lab 09/16/17 1205 09/17/17 0558 09/18/17 0518  NA 136 137 134*  K 4.6 4.9 4.3  CL 95* 95* 93*  CO2 25 26 30   GLUCOSE 242* 160* 256*  BUN 38* 47* 23*  CREATININE 5.77* 6.74* 3.80*  CALCIUM 9.1 9.3 8.4*  AST 49* 32  --   ALT 32 27  --   ALKPHOS 136* 117  --   BILITOT 0.7 0.9  --    ------------------------------------------------------------------------------------------------------------------ No results for input(s): CHOL, HDL, LDLCALC, TRIG, CHOLHDL, LDLDIRECT in the last 72 hours.  Lab Results  Component Value Date    HGBA1C 6.6 (H) 08/31/2017   ------------------------------------------------------------------------------------------------------------------ Recent Labs    09/16/17 1818  TSH 2.151   ------------------------------------------------------------------------------------------------------------------ No results for input(s): VITAMINB12, FOLATE, FERRITIN, TIBC, IRON, RETICCTPCT in the last 72 hours.  Coagulation profile Recent Labs  Lab 09/14/17 09/16/17 1205 09/17/17 0558 09/18/17 0909  INR 4.0 2.73 3.16 3.77    No results for input(s): DDIMER in the last 72 hours.  Cardiac Enzymes Recent Labs  Lab 09/16/17 1818 09/16/17 2347 09/17/17 0558  TROPONINI 0.06* 0.06* 0.06*   ------------------------------------------------------------------------------------------------------------------ No results found for: BNP  Inpatient Medications  Scheduled Meds: . aspirin EC  81 mg Oral Daily  . collagenase   Topical Daily  . diltiazem  240 mg Oral Daily   And  . diltiazem  120 mg Oral QHS  . [START ON 09/19/2017] epoetin (EPOGEN/PROCRIT) injection  6,000 Units Subcutaneous Q T,Th,Sa-HD  . furosemide  40 mg Oral Daily  . hydrALAZINE  50 mg Oral BID  . hydrocortisone  25 mg Rectal BID  . insulin aspart  0-5 Units Subcutaneous QHS  . insulin aspart  0-9 Units Subcutaneous TID WC  . linagliptin  5 mg Oral Daily  . metoprolol tartrate  50 mg Oral BID  . pantoprazole  40 mg Oral Daily  . sevelamer carbonate  3,200 mg Oral TID WC  . sodium chloride flush  3 mL Intravenous Q12H  . traMADol  100 mg Oral QHS  . Warfarin - Pharmacist Dosing Inpatient   Does not apply q1800   Continuous Infusions: . sodium chloride 250 mL (09/18/17 0500)  . sodium chloride    . sodium chloride    . piperacillin-tazobactam (ZOSYN)  IV Stopped (09/18/17 1402)  . vancomycin Stopped (09/17/17 2357)   PRN Meds:.sodium chloride, sodium chloride, sodium chloride, acetaminophen **OR** acetaminophen,  diltiazem, lidocaine (PF), lidocaine-prilocaine, oxyCODONE-acetaminophen, pentafluoroprop-tetrafluoroeth, sodium chloride flush  Micro Results Recent Results (from the past 240 hour(s))  Culture, blood (Routine x 2)     Status: None (Preliminary result)   Collection Time: 09/16/17 12:45 PM  Result Value Ref Range Status   Specimen Description BLOOD RIGHT FOREARM  Final   Special Requests   Final    BOTTLES DRAWN AEROBIC AND ANAEROBIC Blood Culture adequate volume   Culture   Final    NO GROWTH 2 DAYS Performed at [facility], [address], [place], [place] [address]    Report Status PENDING  Incomplete  Culture, blood (Routine x 2)     Status: None (Preliminary result)   Collection Time: 09/16/17 12:45 PM  Result Value Ref Range Status   Specimen Description BLOOD RIGHT HAND  Final   Special Requests   Final    BOTTLES DRAWN AEROBIC ONLY Blood Culture results may not be optimal due to an inadequate volume of blood received in culture bottles   Culture   Final    NO GROWTH 2 DAYS Performed at [facility], [address] [place], [place]  [address]    Report Status PENDING  Incomplete  MRSA PCR Screening     Status: None   Collection Time: 09/18/17  1:25 AM  Result Value Ref Range Status   MRSA by PCR NEGATIVE NEGATIVE Final    Comment:        The GeneXpert MRSA Assay (FDA approved for NASAL specimens only), is one component of a comprehensive MRSA colonization surveillance program. It is not intended to diagnose MRSA infection nor to guide or monitor treatment for MRSA infections. Performed at [facility], [address], [place], [place] [address]     Radiology Reports Dg Chest 2 View  Result Date: 09/17/2017 CLINICAL DATA:  Possible left-sided lung nodule EXAM: CHEST  2 VIEW COMPARISON:  09/16/2017 FINDINGS: Cardiac shadow is enlarged but stable. Aortic calcifications are again seen. The lungs are hyper aerated without focal infiltrate. Minimal scarring is noted in  the left mid lung. Previously seen density is related to the EKG lead. No acute abnormality is noted. IMPRESSION: COPD without acute abnormality. Previously seen density was related to the overlying extrinsic artifact. Electronically Signed   By: Inez Catalina M.D.   On: 09/17/2017 09:05   Dg Chest 2 View  Result Date: 09/16/2017 CLINICAL DATA:  Hypertension and tachycardia. EXAM: CHEST  2 VIEW COMPARISON:  01/24/2017. FINDINGS: Trachea is midline. Heart is enlarged. Thoracic aorta is calcified. There may be a calcified granuloma in the right upper lobe. Small density in the left midlung zone likely relates to the EKG lead. Linear scarring in the lingula. No pleural fluid. Lungs are otherwise clear. IMPRESSION: 1. Small density in the left midlung zone is likely related to the EKG lead. Difficult to exclude a pulmonary nodule. Consider PA and lateral views of the chest in the nonacute setting, in further evaluation, as clinically indicated. 2.  Aortic atherosclerosis (ICD10-170.0). Electronically Signed   By: Lorin Picket M.D.   On: 09/16/2017 12:40   US Abdomen Limited Ruq  Result Date: 09/17/2017 CLINICAL DATA:  Elevated LFTs. EXAM: ULTRASOUND ABDOMEN LIMITED RIGHT UPPER QUADRANT COMPARISON: CT 10/25/2013. FINDINGS: Gallbladder: Gallstones measuring up 6 mm noted. Sludge noted. Gallbladder wall thickness 2.4 mm. Negative Murphy sign. Common bile duct: Diameter: 2.4 mm Liver: No focal lesion identified. Within normal limits in parenchymal echogenicity. Portal vein is patent on color Doppler imaging with normal direction of blood flow towards the liver. Hepatic veins and IVC prominent. This could be related to a cardiac process including congestive heart failure. Increased right renal echogenicity is incidentally noted consistent chronic medical renal disease. Simple right renal cysts incidentally noted, the largest visualized measures 3.2 cm. IMPRESSION: 1.  Multiple tiny gallstones measuring up 6 mm.   Gallbladder sludge. 2. Prominence of the patent veins and IVC noted. This may be secondary to a cardiac process including CHF. 3. Increased right renal echogenicity is incidentally noted consistent chronic medical renal disease. Simple right renal cysts are again noted. Electronically Signed   By: Marcello Moores  Register   On: 09/17/2017 09:01    Time Spent in minutes  25   Parks Czajkowski M.D on 09/18/2017 at 4:20 PM  Between 7am to 7pm - Pager - [phone]  After 7pm go to [email or web address] - password [facility]  [facility] -  Office  [phone]

## 2017-09-18 NOTE — Care Management Note (Signed)
Case Management Note  Patient Details  Name: Destiny Boyd MRN: [id number] Date of Birth: [birth year]  Subjective/Objective:   Adm with Afib. From home with daughter. Patient is active with [facility] for SW, RN, and PT. She walks with a RW. PT has recommended SNF.                 Action/Plan: CM will follow. Daughter interested in SNF.    Expected Discharge Date:    unk              Expected Discharge Plan:     In-House Referral:  Clinical Social Work  Discharge planning Services  CM Consult  Post Acute Care Choice:    Choice offered to:     DME Arranged:    DME Agency:     HH Arranged:    HH Agency:     Status of Service:  In process, will continue to follow  If discussed at Long Length of Stay Meetings, dates discussed:    Additional Comments:  Alvin Rubano, Chauncey [place], RN 09/18/2017, 12:14 PM

## 2017-09-18 NOTE — Evaluation (Addendum)
Physical Therapy Evaluation Patient Details Name: Destiny Boyd MRN: [id number] DOB: 25-Aug-1933 Today's Date: 09/18/2017   History of Present Illness  Destiny Boyd  is a 84 y.o. female, w hypertension, hyperlipidemia, Dm2, ESRD  On HD (T, T, S), Pafib,CVA, PVD  apparently didn't feel well.  Her home health aide noted her to be tachycardic and recommended that she come to [facility]. Pt denies medication noncompliance    Clinical Impression  Patient demonstrates fair/poor balance for gait, narrow base of support, occasional dragging of RLE, has difficulty transferring to [facility] due to BLE weakness and presently fall risk due to generalized weakness.  Patient will benefit from continued physical therapy in hospital and recommended venue below to increase strength, balance, endurance for safe ADLs and gait.  Patient suffers from a stroke with residual weakness  which impairs her ability to perform daily activities like walking, prolonged standing, transfers in her home.  A walker alone will not resolve the issues with performing activities of daily living. A wheelchair will allow patient to safely perform daily activities.  The patient can self propel in the home or has a caregiver who can provide assistance.       Follow Up Recommendations SNF;Supervision/Assistance - 24 hour    Equipment Recommendations  Wheelchair (measurements PT);Wheelchair cushion (measurements PT)    Recommendations for Other Services       Precautions / Restrictions Precautions Precautions: Fall Restrictions Weight Bearing Restrictions: No      Mobility  Bed Mobility Overal bed mobility: Modified Independent                Transfers Overall transfer level: Needs assistance Equipment used: Rolling walker (2 wheeled) Transfers: Sit to/from [facility] Sit to Stand: Min assist Stand pivot transfers: Min assist       General transfer comment: requires VC's for safety and proper hand  placement during sit to stands   Ambulation/Gait Ambulation/Gait assistance: Min assist Ambulation Distance (Feet): 30 Feet Assistive device: Rolling walker (2 wheeled) Gait Pattern/deviations: Decreased step length - right;Decreased step length - left;Decreased stride length   Gait velocity interpretation: Below normal speed for age/gender General Gait Details: demonstrates narrow base of support with occasional dragging of RLE, slow labored cadence limited secondary to c/o fatigue  Stairs            Wheelchair Mobility    Modified Rankin (Stroke Patients Only)       Balance Overall balance assessment: Needs assistance Sitting-balance support: No upper extremity supported;Feet supported Sitting balance-Leahy Scale: Good     Standing balance support: Bilateral upper extremity supported;During functional activity Standing balance-Leahy Scale: Fair                               Pertinent Vitals/Pain Pain Assessment: No/denies pain    Home Living Family/patient expects to be discharged to:: Private residence Living Arrangements: Children Available Help at Discharge: Family Type of Home: House Home Access: Stairs to enter Entrance Stairs-Rails: None Entrance Stairs-Number of Steps: 2 Home Layout: One level Home Equipment: Environmental consultant - 4 wheels;Shower seat Additional Comments: shower seat is borrowed    Prior Function Level of Independence: Independent with assistive device(s)         Comments: ambulates household with 4 wheeled walker     Hand Dominance   Dominant Hand: Right    Extremity/Trunk Assessment   Upper Extremity Assessment Upper Extremity Assessment: Generalized weakness    Lower  Extremity Assessment Lower Extremity Assessment: Generalized weakness    Cervical / Trunk Assessment Cervical / Trunk Assessment: Normal  Communication   Communication: No difficulties  Cognition Arousal/Alertness: Awake/alert Behavior During  Therapy: WFL for tasks assessed/performed Overall Cognitive Status: Within Functional Limits for tasks assessed                                        General Comments      Exercises     Assessment/Plan    PT Assessment Patient needs continued PT services  PT Problem List Decreased strength;Decreased activity tolerance;Decreased balance;Decreased mobility       PT Treatment Interventions Gait training;Functional mobility training;Therapeutic activities;Therapeutic exercise;Patient/family education;Stair training    PT Goals (Current goals can be found in the Care Plan section)  Acute Rehab PT Goals Patient Stated Goal: return home after rehab PT Goal Formulation: With patient/family Time For Goal Achievement: 10/02/17 Potential to Achieve Goals: Good    Frequency Min 3X/week   Barriers to discharge        Co-evaluation               AM-PAC PT "6 Clicks" Daily Activity  Outcome Measure Difficulty turning over in bed (including adjusting bedclothes, sheets and blankets)?: None Difficulty moving from lying on back to sitting on the side of the bed? : None Difficulty sitting down on and standing up from a chair with arms (e.g., wheelchair, bedside commode, etc,.)?: A Little Help needed moving to and from a bed to chair (including a wheelchair)?: A Little Help needed walking in hospital room?: A Little Help needed climbing 3-5 steps with a railing? : A Lot 6 Click Score: 19    End of Session   Activity Tolerance: Patient tolerated treatment well;Patient limited by fatigue Patient left: in bed;with call bell/phone within reach;with family/visitor present Nurse Communication: Mobility status PT Visit Diagnosis: Unsteadiness on feet (R26.81);Other abnormalities of gait and mobility (R26.89);Muscle weakness (generalized) (M62.81)    Time: 1610-9604 PT Time Calculation (min) (ACUTE ONLY): 31 min   Charges:   PT Evaluation $PT Eval Moderate  Complexity: 1 Mod PT Treatments $Therapeutic Activity: 23-37 mins   PT G Codes:        12:00 PM, 10-12-17 Lonell Grandchild, MPT Physical Therapist with [facility] 336 [phone] office [phone] mobile phone

## 2017-09-18 NOTE — Plan of Care (Signed)
  Acute Rehab PT Goals(only PT should resolve) Patient Will Transfer Sit To/From Stand 09/18/2017 1202 - Progressing by Lonell Grandchild, PT Flowsheets Taken 09/18/2017 1202  Patient will transfer sit to/from stand with supervision Pt Will Transfer Bed To Chair/Chair To Bed 09/18/2017 1202 - Progressing by Lonell Grandchild, PT Flowsheets Taken 09/18/2017 1202  Pt will Transfer Bed to Chair/Chair to Bed with supervision Pt Will Ambulate 09/18/2017 1202 - Progressing by Lonell Grandchild, PT Flowsheets Taken 09/18/2017 1202  Pt will Ambulate with min guard assist;50 feet;with rolling walker  12:02 PM, 09/18/17 Lonell Grandchild, MPT Physical Therapist with [facility] 336 [phone] office [phone] mobile phone

## 2017-09-18 NOTE — Progress Notes (Signed)
Subjective: Interval History: has no complaint of difficulty breathing or palpitation.  Presently she offers no complaints..  Objective: Vital signs in last 24 hours: Temp:  [98.1 F (36.7 C)-99.1 F (37.3 C)] 99.1 F (37.3 C) (02/01 0510) Pulse Rate:  [76-158] 91 (02/01 0510) Resp:  [16-18] 16 (02/01 0510) BP: (93-144)/(42-75) 95/59 (02/01 0510) SpO2:  [89 %-98 %] 98 % (02/01 0510) FiO2 (%):  [1 %] 1 % (02/01 0510) Weight:  [54.2 kg (119 lb 7.8 oz)-56.2 kg (123 lb 14.4 oz)] 56.2 kg (123 lb 14.4 oz) (02/01 0510) Weight change: 4.497 kg (9 lb 14.6 oz)  Intake/Output from previous day: 01/31 0701 - 02/01 0700 In: 860 [P.O.:480; I.V.:230; IV Piggyback:150] Out: 1019  Intake/Output this shift: No intake/output data recorded.  General appearance: alert, cooperative and no distress Resp: clear to auscultation bilaterally Cardio: irregularly irregular rhythm Extremities: No edema  Lab Results: Recent Labs    09/17/17 0558 09/18/17 0518  WBC 19.0* 14.8*  HGB 8.7* 8.5*  HCT 28.7* 27.9*  PLT 322 350   BMET:  Recent Labs    09/17/17 0558 09/18/17 0518  NA 137 134*  K 4.9 4.3  CL 95* 93*  CO2 26 30  GLUCOSE 160* 256*  BUN 47* 23*  CREATININE 6.74* 3.80*  CALCIUM 9.3 8.4*   No results for input(s): PTH in the last 72 hours. Iron Studies: No results for input(s): IRON, TIBC, TRANSFERRIN, FERRITIN in the last 72 hours.  Studies/Results: Dg Chest 2 View  Result Date: 09/17/2017 CLINICAL DATA:  Possible left-sided lung nodule EXAM: CHEST  2 VIEW COMPARISON:  09/16/2017 FINDINGS: Cardiac shadow is enlarged but stable. Aortic calcifications are again seen. The lungs are hyper aerated without focal infiltrate. Minimal scarring is noted in the left mid lung. Previously seen density is related to the EKG lead. No acute abnormality is noted. IMPRESSION: COPD without acute abnormality. Previously seen density was related to the overlying extrinsic artifact. Electronically Signed    By: Inez Catalina M.D.   On: 09/17/2017 09:05   Dg Chest 2 View  Result Date: 09/16/2017 CLINICAL DATA:  Hypertension and tachycardia. EXAM: CHEST  2 VIEW COMPARISON:  01/24/2017. FINDINGS: Trachea is midline. Heart is enlarged. Thoracic aorta is calcified. There may be a calcified granuloma in the right upper lobe. Small density in the left midlung zone likely relates to the EKG lead. Linear scarring in the lingula. No pleural fluid. Lungs are otherwise clear. IMPRESSION: 1. Small density in the left midlung zone is likely related to the EKG lead. Difficult to exclude a pulmonary nodule. Consider PA and lateral views of the chest in the nonacute setting, in further evaluation, as clinically indicated. 2.  Aortic atherosclerosis (ICD10-170.0). Electronically Signed   By: Lorin Picket M.D.   On: 09/16/2017 12:40   US Abdomen Limited Ruq  Result Date: 09/17/2017 CLINICAL DATA:  Elevated LFTs. EXAM: ULTRASOUND ABDOMEN LIMITED RIGHT UPPER QUADRANT COMPARISON: CT 10/25/2013. FINDINGS: Gallbladder: Gallstones measuring up 6 mm noted. Sludge noted. Gallbladder wall thickness 2.4 mm. Negative Murphy sign. Common bile duct: Diameter: 2.4 mm Liver: No focal lesion identified. Within normal limits in parenchymal echogenicity. Portal vein is patent on color Doppler imaging with normal direction of blood flow towards the liver. Hepatic veins and IVC prominent. This could be related to a cardiac process including congestive heart failure. Increased right renal echogenicity is incidentally noted consistent chronic medical renal disease. Simple right renal cysts incidentally noted, the largest visualized measures 3.2 cm. IMPRESSION: 1.  Multiple tiny gallstones measuring up 6 mm.  Gallbladder sludge. 2. Prominence of the patent veins and IVC noted. This may be secondary to a cardiac process including CHF. 3. Increased right renal echogenicity is incidentally noted consistent chronic medical renal disease. Simple right  renal cysts are again noted. Electronically Signed   By: Marcello Moores  Register   On: 09/17/2017 09:01    I have reviewed the patient's current medications.  Assessment/Plan: 1] atrial fibrillation: Her heart rate is controlled. 2] hypertension: Patient presently on Cardizem CD 240 mg p.o. daily, amlodipine 10 mg p.o. daily, hydralazine 50 mg p.o. twice daily.  Her blood pressure is low normal.  She had episode of hypotension on dialysis with some tachycardia. 3] fluid management: No sign of fluid overload.  We are able to remove about a liter yesterday because of recurrent hypotension. 4] bone and mineral disorder: Her calcium and phosphorus is range 5] bone and mineral disorder her calcium and phosphorus is range. 6] leukocytosis: Etiology not clear.  Patient is afebrile.  Her white blood cell count is improving on antibiotics. Plan: 1] we will DC amlodipine 2] if her blood pressure continues to be low then we may need to DC hydralazine. 3] patient does require dialysis today 4] we will dialyze patient in the morning. 5] will use Epogen 6000 units after each dialysis. 6] we will check her CBC and renal panel in the morning.    LOS: 2 days   Jomarie Gellis S 09/18/2017,9:02 AM

## 2017-09-18 NOTE — Progress Notes (Signed)
ANTICOAGULATION CONSULT NOTE -  Pharmacy Consult for Warfarin Indication: atrial fibrillation  No Known Allergies  Patient Measurements: Height: 5\' 4"  (162.6 cm) Weight: 123 lb 14.4 oz (56.2 kg) IBW/kg (Calculated) : 54.7   Vital Signs: Temp: 99.1 F (37.3 C) (02/01 0510) Temp Source: Oral (02/01 0510) BP: 95/59 (02/01 0510) Pulse Rate: 91 (02/01 0510)  Labs: Recent Labs    09/16/17 1205 09/16/17 1818 09/16/17 2347 09/17/17 0558 09/18/17 0518 09/18/17 0909  HGB 9.3*  --   --  8.7* 8.5*  --   HCT 31.5*  --   --  28.7* 27.9*  --   PLT 315  --   --  322 350  --   LABPROT 28.7*  --   --  32.2*  --  36.9*  INR 2.73  --   --  3.16  --  3.77  CREATININE 5.77*  --   --  6.74* 3.80*  --   TROPONINI 0.08* 0.06* 0.06* 0.06*  --   --     Estimated Creatinine Clearance: 10 mL/min (A) (by C-G formula based on SCr of 3.8 mg/dL (H)).   Medical History: Past Medical History:  Diagnosis Date  . 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 Colon cancer ([place])    cecum cancer  . Diabetes mellitus   . ESRD (end stage renal disease) ([place])   . Full dentures   . GERD (gastroesophageal reflux disease)   . Gout   . Hypercholesteremia   . Hypertension   . Iron deficiency anemia   . Leukopenia   . PAF (paroxysmal atrial fibrillation) ([facility])    a. on Coumadin for anticoagulation  . Peripheral vascular disease ([place])   . Stroke ([place]) 2005  . Wears glasses     Medications:  Medications Prior to Admission  Medication Sig Dispense Refill Last Dose  . amLODipine (NORVASC) 10 MG tablet Take 1 tablet by mouth daily.   09/16/2017 at Unknown time  . aspirin EC 81 MG tablet Take 81 mg by mouth daily.   09/16/2017 at Unknown time  . atenolol (TENORMIN) 100 MG tablet Take 1 tablet by mouth daily.   09/16/2017 at Unknown time  . diltiazem (CARDIZEM CD) 120 MG 24 hr capsule Take 240 mg ( 2 tablets ) in th am, and take 120 mg ( 1 tablet)  in the pm (Patient taking differently: Take 120-240 mg by mouth See  admin instructions. Take 240 mg ( 2 tablets ) in th am, and take 120 mg ( 1 tablet)  in the pm) 90 capsule 3 09/16/2017 at Unknown time  . furosemide (LASIX) 40 MG tablet Take 1 tablet by mouth daily.   09/16/2017 at Unknown time  . hydrALAZINE (APRESOLINE) 50 MG tablet Take 1 tablet by mouth 2 (two) times daily.   09/16/2017 at Unknown time  . multivitamin (RENA-VIT) TABS tablet Take 1 tablet by mouth daily.   09/16/2017 at Unknown time  . oxyCODONE-acetaminophen (PERCOCET/ROXICET) 5-325 MG tablet Take 1 tablet by mouth every 6 (six) hours as needed for moderate pain. 10 tablet 0 09/15/2017 at Unknown time  . pantoprazole (PROTONIX) 40 MG tablet Take 1 tablet (40 mg total) by mouth daily. 30 tablet 3 09/16/2017 at Unknown time  . sevelamer carbonate (RENVELA) 800 MG tablet Take 3,200 mg by mouth 3 (three) times daily with meals. & 1600 by mouth with snacks   09/16/2017 at Unknown time  . TRADJENTA 5 MG TABS tablet Take 1 tablet by mouth daily.  09/16/2017 at Unknown time  . traMADol (ULTRAM) 50 MG tablet Take 100 mg by mouth at bedtime.    Past Week at Unknown time  . warfarin (COUMADIN) 2.5 MG tablet Take 2.5-5 mg by mouth. Per 09/16/17 warfarin note in chart:  2.5 mg Monday, Wednesday and Friday, and 5 mg Tuesday, Thursday, Saturday and sunday   Past Week at Unknown time  . diltiazem (TIAZAC) 120 MG 24 hr capsule Take 120 mg by mouth 2 (two) times daily.  3     Assessment: 45 yof on coumadin for afib. S/P LLE angiogram/calf biopsy on 1/14.  INR currently supratherapeutic. Last anti-coag visit on 1/28 had patient taking 2.5 mg MWF and 5 mg ROW.   Goal of Therapy:  INR 2-3 Monitor platelets by anticoagulation protocol: Yes   Plan:  HOLD warfarin today x1 day Daily INR, CBC every 3 days  Excell Seltzer, PharmD Clinical Pharmacist 09/18/2017 12:59 PM

## 2017-09-19 DIAGNOSIS — D72829 Elevated white blood cell count, unspecified: Secondary | ICD-10-CM | POA: Diagnosis present

## 2017-09-19 LAB — GLUCOSE, CAPILLARY
Glucose-Capillary: 113 mg/dL — ABNORMAL HIGH (ref 65–99)
Glucose-Capillary: 218 mg/dL — ABNORMAL HIGH (ref 65–99)
Glucose-Capillary: 119 mg/dL — ABNORMAL HIGH (ref 65–99)

## 2017-09-19 LAB — CBC
HEMATOCRIT: 28.8 % — AB (ref 36.0–46.0)
Hemoglobin: 8.9 g/dL — ABNORMAL LOW (ref 12.0–15.0)
MCH: 27.1 pg (ref 26.0–34.0)
MCHC: 30.9 g/dL (ref 30.0–36.0)
MCV: 87.8 fL (ref 78.0–100.0)
Platelets: 394 10*3/uL (ref 150–400)
RBC: 3.28 MIL/uL — AB (ref 3.87–5.11)
RDW: 15.3 % (ref 11.5–15.5)
WBC: 15.7 10*3/uL — ABNORMAL HIGH (ref 4.0–10.5)

## 2017-09-19 LAB — PROTIME-INR
INR: 2.31
Prothrombin Time: 25.2 seconds — ABNORMAL HIGH (ref 11.4–15.2)

## 2017-09-19 LAB — RENAL FUNCTION PANEL
ANION GAP: 20 — AB (ref 5–15)
Albumin: 2.5 g/dL — ABNORMAL LOW (ref 3.5–5.0)
BUN: 34 mg/dL — ABNORMAL HIGH (ref 6–20)
CALCIUM: 9.4 mg/dL (ref 8.9–10.3)
CHLORIDE: 93 mmol/L — AB (ref 101–111)
CO2: 24 mmol/L (ref 22–32)
Creatinine, Ser: 5.49 mg/dL — ABNORMAL HIGH (ref 0.44–1.00)
GFR calc non Af Amer: 7 mL/min — ABNORMAL LOW (ref >60)
GFR, EST AFRICAN AMERICAN: 8 mL/min — AB (ref >60)
Glucose, Bld: 139 mg/dL — ABNORMAL HIGH (ref 65–99)
POTASSIUM: 4.8 mmol/L (ref 3.5–5.1)
Phosphorus: 4.8 mg/dL — ABNORMAL HIGH (ref 2.5–4.6)
Sodium: 137 mmol/L (ref 135–145)

## 2017-09-19 MED ORDER — EPOETIN ALFA 10000 UNIT/ML IJ SOLN
INTRAMUSCULAR | Status: AC
  Administered 2017-09-19: 6000 [IU] via SUBCUTANEOUS
  Filled 2017-09-19: qty 1

## 2017-09-19 MED ORDER — AMOXICILLIN-POT CLAVULANATE 875-125 MG PO TABS: 1.0000 | ORAL_TABLET | Freq: Two times a day (BID) | ORAL | 0 refills | Status: AC

## 2017-09-19 MED ORDER — SODIUM CHLORIDE 0.9 % IV SOLN: 100.0000 mL | INTRAVENOUS | Status: DC | PRN

## 2017-09-19 MED ORDER — METOPROLOL TARTRATE 50 MG PO TABS: 50.0000 mg | ORAL_TABLET | Freq: Two times a day (BID) | ORAL | 0 refills | Status: AC

## 2017-09-19 MED ORDER — WARFARIN SODIUM 5 MG PO TABS
5.0000 mg | ORAL_TABLET | Freq: Once | ORAL | Status: AC
  Administered 2017-09-19: 5 mg via ORAL
  Filled 2017-09-19: qty 1

## 2017-09-19 NOTE — Progress Notes (Signed)
Notified [facility] that patient will DC today, patient active, orders in place.

## 2017-09-19 NOTE — Progress Notes (Signed)
ANTICOAGULATION CONSULT NOTE -  Pharmacy Consult for Warfarin Indication: atrial fibrillation  No Known Allergies  Patient Measurements: Height: 5\' 4"  (162.6 cm) Weight: 113 lb 14.4 oz (51.7 kg) IBW/kg (Calculated) : 54.7   Vital Signs: Temp: 97.6 F (36.4 C) (02/02 0503) Temp Source: Oral (02/02 0503) BP: 115/72 (02/02 0503) Pulse Rate: 86 (02/02 0503)  Labs: Recent Labs    09/16/17 1818 09/16/17 2347 09/17/17 0558 09/18/17 0518 09/18/17 0909 09/19/17 0706  HGB  --   --  8.7* 8.5*  --  8.9*  HCT  --   --  28.7* 27.9*  --  28.8*  PLT  --   --  322 350  --  394  LABPROT  --   --  32.2*  --  36.9* 25.2*  INR  --   --  3.16  --  3.77 2.31  CREATININE  --   --  6.74* 3.80*  --  5.49*  TROPONINI 0.06* 0.06* 0.06*  --   --   --     Estimated Creatinine Clearance: 6.6 mL/min (A) (by C-G formula based on SCr of 5.49 mg/dL (H)).   Medical History: Past Medical History:  Diagnosis Date  . 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 Colon cancer ([place])    cecum cancer  . Diabetes mellitus   . ESRD (end stage renal disease) ([place])   . Full dentures   . GERD (gastroesophageal reflux disease)   . Gout   . Hypercholesteremia   . Hypertension   . Iron deficiency anemia   . Leukopenia   . PAF (paroxysmal atrial fibrillation) ([facility])    a. on Coumadin for anticoagulation  . Peripheral vascular disease ([place])   . Stroke ([place]) 2005  . Wears glasses     Medications:  Medications Prior to Admission  Medication Sig Dispense Refill Last Dose  . amLODipine (NORVASC) 10 MG tablet Take 1 tablet by mouth daily.   09/16/2017 at Unknown time  . aspirin EC 81 MG tablet Take 81 mg by mouth daily.   09/16/2017 at Unknown time  . atenolol (TENORMIN) 100 MG tablet Take 1 tablet by mouth daily.   09/16/2017 at Unknown time  . diltiazem (CARDIZEM CD) 120 MG 24 hr capsule Take 240 mg ( 2 tablets ) in th am, and take 120 mg ( 1 tablet)  in the pm (Patient taking differently: Take 120-240 mg by mouth  See admin instructions. Take 240 mg ( 2 tablets ) in th am, and take 120 mg ( 1 tablet)  in the pm) 90 capsule 3 09/16/2017 at Unknown time  . furosemide (LASIX) 40 MG tablet Take 1 tablet by mouth daily.   09/16/2017 at Unknown time  . hydrALAZINE (APRESOLINE) 50 MG tablet Take 1 tablet by mouth 2 (two) times daily.   09/16/2017 at Unknown time  . multivitamin (RENA-VIT) TABS tablet Take 1 tablet by mouth daily.   09/16/2017 at Unknown time  . oxyCODONE-acetaminophen (PERCOCET/ROXICET) 5-325 MG tablet Take 1 tablet by mouth every 6 (six) hours as needed for moderate pain. 10 tablet 0 09/15/2017 at Unknown time  . pantoprazole (PROTONIX) 40 MG tablet Take 1 tablet (40 mg total) by mouth daily. 30 tablet 3 09/16/2017 at Unknown time  . sevelamer carbonate (RENVELA) 800 MG tablet Take 3,200 mg by mouth 3 (three) times daily with meals. & 1600 by mouth with snacks   09/16/2017 at Unknown time  . TRADJENTA 5 MG TABS tablet Take 1 tablet by mouth daily.  09/16/2017 at Unknown time  . traMADol (ULTRAM) 50 MG tablet Take 100 mg by mouth at bedtime.    Past Week at Unknown time  . warfarin (COUMADIN) 2.5 MG tablet Take 2.5-5 mg by mouth. Per 09/16/17 warfarin note in chart:  2.5 mg Monday, Wednesday and Friday, and 5 mg Tuesday, Thursday, Saturday and sunday   Past Week at Unknown time  . diltiazem (TIAZAC) 120 MG 24 hr capsule Take 120 mg by mouth 2 (two) times daily.  3     Assessment: 59 yof on coumadin for afib. S/P LLE angiogram/calf biopsy on 1/14.  INR has trended down.    Goal of Therapy:  INR 2-3 Monitor platelets by anticoagulation protocol: Yes   Plan:  5mg  warfarin today Daily INR, CBC every 3 days  Excell Seltzer, PharmD Clinical Pharmacist 09/19/2017 9:30 AM

## 2017-09-19 NOTE — Procedures (Signed)
    HEMODIALYSIS TREATMENT NOTE:   3 hour heparin-free dialysis completed via left upper arm AVF (16g/antegrade). Goal met: 1 liter removed without interruption in ultrafiltration.  All blood was returned and hemostasis was achieved within 12 minutes. Report given to Santa Lighter, RN.   Rockwell Alexandria, RN, CDN

## 2017-09-19 NOTE — Progress Notes (Signed)
Removed IV-clean, dry, intact. Reviewed d/c paperwork with pt and daughter-in-law. Daughter called and updated. Reviewed new medications and home meds. Answered all questions. Stable pt was wheeled to main entrance by Network engineer where she was picked up by daughter-in-law.

## 2017-09-19 NOTE — Progress Notes (Signed)
Subjective: Interval History: Patient offers no complaints.  She denies any palpitation, no dizziness or lightheadedness.  Her appetite remains good  Objective: Vital signs in last 24 hours: Temp:  [97.6 F (36.4 C)-98.6 F (37 C)] 97.6 F (36.4 C) (02/02 0503) Pulse Rate:  [59-86] 86 (02/02 0503) Resp:  [16-18] 16 (02/02 0503) BP: (93-115)/(41-72) 115/72 (02/02 0503) SpO2:  [92 %-96 %] 96 % (02/02 0503) Weight:  [51.7 kg (113 lb 14.4 oz)] 51.7 kg (113 lb 14.4 oz) (02/02 0503) Weight change: -3.635 kg (-0.2 oz)  Intake/Output from previous day: 02/01 0701 - 02/02 0700 In: 740 [P.O.:180; I.V.:510; IV Piggyback:50] Out: 0  Intake/Output this shift: No intake/output data recorded.  General appearance: alert, cooperative and no distress Resp: clear to auscultation bilaterally Cardio: irregularly irregular rhythm Extremities: No edema  Lab Results: Recent Labs    09/18/17 0518 09/19/17 0706  WBC 14.8* 15.7*  HGB 8.5* 8.9*  HCT 27.9* 28.8*  PLT 350 394   BMET:  Recent Labs    09/18/17 0518 09/19/17 0706  NA 134* 137  K 4.3 4.8  CL 93* 93*  CO2 30 24  GLUCOSE 256* 139*  BUN 23* 34*  CREATININE 3.80* 5.49*  CALCIUM 8.4* 9.4   No results for input(Boyd): PTH in the last 72 hours. Iron Studies: No results for input(Boyd): IRON, TIBC, TRANSFERRIN, FERRITIN in the last 72 hours.  Studies/Results: No results found.  I have reviewed the patient'Boyd current medications.  Assessment/Plan: 1] atrial fibrillation: Her heart rate is controlled. 2] hypertension: Patient presently on Cardizem CD 240 mg p.o. daily,  hydralazine 50 mg p.o. twice daily and metoprolol.  Her blood pressure remains low normal.  Her amlodipine was discontinued yesterday.  She had episode of hypotension with tachycardia the last time she was put on dialysis.   3] fluid management: No sign of fluid overload.  . 4] bone and mineral disorder: Her calcium and phosphorus is range 5] anemia: Her hemoglobin is  below our target goal but improving.  Patient presently on Epogen. 6] leukocytosis: Etiology not clear.  Patient is afebrile.  Her white blood cell count is improving on antibiotics. Plan: 1] we will DC hydralazine. 2] if her blood pressure continues to be low and her heart rate is controlled possibly we may need to use either Cardizem or metoprolol to control her heart rate. 3] we will dialyze patient today 4] will continue with Epogen 5]] we will check her CBC and renal panel in the morning.    LOS: 3 days   Destiny Boyd 09/19/2017,9:19 AM

## 2017-09-19 NOTE — Discharge Summary (Signed)
Physician Discharge Summary  Destiny Boyd DOB: 1933/11/19 DOA: 09/16/2017  PCP: Rosita Fire, MD  Admit date: 09/16/2017 Discharge date: 09/19/2017  Admitted From: Home Disposition: Home  Recommendations for Outpatient Follow-up:  1. Follow up with PCP in 1-2 weeks.  She is being discharged on 5-day course of Augmentin for left leg wound.  Check WBC as outpatient.   Home Health: RN and PT Equipment/Devices: As per home health  Discharge Condition fair CODE STATUS: Full code Diet recommendation: Renal    Discharge Diagnoses:  Principal problems:    Atrial fibrillation with RVR ([place])  Active problems   ESRD on dialysis (HCC)   Elevated troponin   Pressure injury of skin   Abnormal liver function   Leucocytosis  Brief narrative/HPI Please refer to admission H&P for details, in brief,84 year old female with history of hypertension, diabetes mellitus, A. fib on Coumadin, end-stage renal disease on dialysis (Tuesdays ,Thursdays and Saturday) sent by her home health nurse  after she was found to be tachycardic.  Patient reports that since yesterday she was feeling extremely weak.  Denies any fevers, chills, nausea, vomiting, chest pain, palpitations, shortness of breath, abdominal pain, dysuria (make some urine) or diarrhea. She was found to be in rapid A. fib in the ED, responded to IV Lopressor and admitted to hospitalist service on telemetry.  Blood work showed elevated WBC (19 K, mildly elevated lactate with no source of infection) .  Hospital course  principle problem Atrial fibrillation with RVR (HCC) No clear etiology.   Continue home dose Cardizem (240 mg a.m. and 120 mg p.m.) switched atenolol to metoprolol 50 mg twice daily.   Heart rate better controlled. INR therapeutic on Coumadin.  TSH normal. 2D echo with normal EF of 60-65% without wall motion abnormality and shows LVH.    active Problems: Leukocytosis Elevated WBC of 19 K with mildly  elevated lactic acid on presentation.  No clear source of infection.  Empiric antibiotic discontinued after negative blood cultures. Upon wound care evaluation she had some discharge from her left tibia.  She has calcified lactic lesions around the area.  She was seen by vascular surgery recently and had aortogram done.  Wound care reported some drainage around the area.  I have not noticed them and do not see any clear sign of infection.  I spoke with general surgery who recommended that debridement of calcific lactic lesion and dialysis patient is not recommended. Patient remains afebrile and WBC has been trending down.  I will discharge her on 7-day course of oral Augmentin to cover for possible skin and soft tissue infection.    ESRD on dialysis [facility]) Nephrology following.     discharge home after dialysis today.    Abnormal liver function Mild.  Resolved in a.m. lab.  Ultrasound abdomen shows multiple tiny gallstones with gallbladder sludge.  Given mild transaminitis that has resolved and absence of fever or right upper quadrant tenderness I think acute cholangitis or cholecystitis is unlikely.       Elevated troponin Suspect secondary to ESRD and A. fib.  2D echo reviewed.  No chest pain symptoms.  Continue beta-blocker.  Diabetes mellitus type II CBG stable.  Continue linagliptin     Anemia of chronic disease H&H stable.  Noted to have small rectal bleed yesterday likely hemorrhoidal.    Resolved with initial suppository.  Generalized weakness Seen by PT and recommends SNF.    Patient wantsto go home with her daughter who lives with her and  can provide care throughout the day.  I offered home health services and both patient and daughter agree with that.   Family Communication  :  Spoke with daughter on the phone  Disposition Plan  :  Home with home health RN and PT    Consults  :  Nephrology  Procedures  : 2D echo Ultrasound abdomen       Discharge  Instructions   Allergies as of 09/19/2017   No Known Allergies     Medication List    STOP taking these medications   atenolol 100 MG tablet Commonly known as:  TENORMIN   diltiazem 120 MG 24 hr capsule Commonly known as:  TIAZAC   oxyCODONE-acetaminophen 5-325 MG tablet Commonly known as:  PERCOCET/ROXICET     TAKE these medications   amLODipine 10 MG tablet Commonly known as:  NORVASC Take 1 tablet by mouth daily.   amoxicillin-clavulanate 875-125 MG tablet Commonly known as:  AUGMENTIN Take 1 tablet by mouth every 12 (twelve) hours for 7 days.   aspirin EC 81 MG tablet Take 81 mg by mouth daily.   diltiazem 120 MG 24 hr capsule Commonly known as:  CARDIZEM CD Take 240 mg ( 2 tablets ) in th am, and take 120 mg ( 1 tablet)  in the pm What changed:    how much to take  how to take this  when to take this  additional instructions   furosemide 40 MG tablet Commonly known as:  LASIX Take 1 tablet by mouth daily.   hydrALAZINE 50 MG tablet Commonly known as:  APRESOLINE Take 1 tablet by mouth 2 (two) times daily.   metoprolol tartrate 50 MG tablet Commonly known as:  LOPRESSOR Take 1 tablet (50 mg total) by mouth 2 (two) times daily.   multivitamin Tabs tablet Take 1 tablet by mouth daily.   pantoprazole 40 MG tablet Commonly known as:  PROTONIX Take 1 tablet (40 mg total) by mouth daily.   sevelamer carbonate 800 MG tablet Commonly known as:  RENVELA Take 3,200 mg by mouth 3 (three) times daily with meals. & 1600 by mouth with snacks   TRADJENTA 5 MG Tabs tablet Generic drug:  linagliptin Take 1 tablet by mouth daily.   traMADol 50 MG tablet Commonly known as:  ULTRAM Take 100 mg by mouth at bedtime.   warfarin 2.5 MG tablet Commonly known as:  COUMADIN Take as directed. If you are unsure how to take this medication, talk to your nurse or doctor. Original instructions:  Take 2.5-5 mg by mouth. Per 09/16/17 warfarin note in chart:  2.5 mg  Monday, Wednesday and Friday, and 5 mg Tuesday, Thursday, Saturday and sunday      Follow-up Information    Rosita Fire, MD. Schedule an appointment as soon as possible for a visit in 1 week(s).   Specialty:  Internal Medicine Contact information: [place] [address] [phone]          No Known Allergies    Procedures/Studies: Dg Chest 2 View  Result Date: 09/17/2017 CLINICAL DATA:  Possible left-sided lung nodule EXAM: CHEST  2 VIEW COMPARISON:  09/16/2017 FINDINGS: Cardiac shadow is enlarged but stable. Aortic calcifications are again seen. The lungs are hyper aerated without focal infiltrate. Minimal scarring is noted in the left mid lung. Previously seen density is related to the EKG lead. No acute abnormality is noted. IMPRESSION: COPD without acute abnormality. Previously seen density was related to the overlying extrinsic  artifact. Electronically Signed   By: Inez Catalina M.D.   On: 09/17/2017 09:05   Dg Chest 2 View  Result Date: 09/16/2017 CLINICAL DATA:  Hypertension and tachycardia. EXAM: CHEST  2 VIEW COMPARISON:  01/24/2017. FINDINGS: Trachea is midline. Heart is enlarged. Thoracic aorta is calcified. There may be a calcified granuloma in the right upper lobe. Small density in the left midlung zone likely relates to the EKG lead. Linear scarring in the lingula. No pleural fluid. Lungs are otherwise clear. IMPRESSION: 1. Small density in the left midlung zone is likely related to the EKG lead. Difficult to exclude a pulmonary nodule. Consider PA and lateral views of the chest in the nonacute setting, in further evaluation, as clinically indicated. 2.  Aortic atherosclerosis (ICD10-170.0). Electronically Signed   By: Lorin Picket M.D.   On: 09/16/2017 12:40   US Abdomen Limited Ruq  Result Date: 09/17/2017 CLINICAL DATA:  Elevated LFTs. EXAM: ULTRASOUND ABDOMEN LIMITED RIGHT UPPER QUADRANT COMPARISON: CT 10/25/2013. FINDINGS: Gallbladder:  Gallstones measuring up 6 mm noted. Sludge noted. Gallbladder wall thickness 2.4 mm. Negative Murphy sign. Common bile duct: Diameter: 2.4 mm Liver: No focal lesion identified. Within normal limits in parenchymal echogenicity. Portal vein is patent on color Doppler imaging with normal direction of blood flow towards the liver. Hepatic veins and IVC prominent. This could be related to a cardiac process including congestive heart failure. Increased right renal echogenicity is incidentally noted consistent chronic medical renal disease. Simple right renal cysts incidentally noted, the largest visualized measures 3.2 cm. IMPRESSION: 1.  Multiple tiny gallstones measuring up 6 mm.  Gallbladder sludge. 2. Prominence of the patent veins and IVC noted. This may be secondary to a cardiac process including CHF. 3. Increased right renal echogenicity is incidentally noted consistent chronic medical renal disease. Simple right renal cysts are again noted. Electronically Signed   By: Marcello Moores  Register   On: 09/17/2017 09:01     2D echo Study Conclusions  - Left ventricle: The cavity size was normal. Wall thickness was   increased in a pattern of mild LVH. Systolic function was normal.   The estimated ejection fraction was in the range of 60% to 65%.   Wall motion was normal; there were no regional wall motion   abnormalities. The study was not technically sufficient to allow   evaluation of LV diastolic dysfunction due to atrial   fibrillation. - Aortic valve: Mildly calcified annulus. Trileaflet; moderately   calcified leaflets. There was moderate stenosis. Mean gradient   (S): 12 mm Hg. Peak gradient (S): 28 mm Hg. VTI ratio of LVOT to   aortic valve: 0.44. Valve area (VTI): 1.39 cm^2. - Mitral valve: Moderately calcified annulus. Mildly thickened,   mildly calcified leaflets . There was mild to moderate   regurgitation. - Left atrium: The atrium was severely dilated. - Right atrium: The atrium was mildly  to moderately dilated. - Tricuspid valve: There was mild regurgitation. - Pulmonary arteries: PA peak pressure: 36 mm Hg (S). - Pericardium, extracardiac: There was no pericardial effusion.   Subjective: No overnight events.  Denies any symptoms.  Discharge Exam: Vitals:   09/18/17 2127 09/19/17 0503  BP: (!) 112/57 115/72  Pulse: 77 86  Resp: 18 16  Temp: 98.4 F (36.9 C) 97.6 F (36.4 C)  SpO2: 96% 96%   Vitals:   09/18/17 0510 09/18/17 1431 09/18/17 2127 09/19/17 0503  BP: (!) 95/59 (!) 93/41 (!) 112/57 115/72  Pulse: 91 (!) 59 77 86  Resp: 16 16 18 16   Temp: 99.1 F (37.3 C) 98.6 F (37 C) 98.4 F (36.9 C) 97.6 F (36.4 C)  TempSrc: Oral Oral Oral Oral  SpO2: 98% 92% 96% 96%  Weight: 56.2 kg (123 lb 14.4 oz)   51.7 kg (113 lb 14.4 oz)  Height:        General: Elderly female not in distress HEENT: Pallor present, moist mucosa, supple neck Chest: Clear to auscultation bilaterally CVS: S1 and S2 irregular, no murmurs rubs or gallop GI: Soft, nondistended, nontender Musculoskeletal: Warm, no edema, calciphylaxis and skin breakdown over left tibia, left upper extremity AV fistula      The results of significant diagnostics from this hospitalization (including imaging, microbiology, ancillary and laboratory) are listed below for reference.     Microbiology: Recent Results (from the past 240 hour(s))  Culture, blood (Routine x 2)     Status: None (Preliminary result)   Collection Time: 09/16/17 12:45 PM  Result Value Ref Range Status   Specimen Description BLOOD RIGHT FOREARM  Final   Special Requests   Final    BOTTLES DRAWN AEROBIC AND ANAEROBIC Blood Culture adequate volume   Culture   Final    NO GROWTH 3 DAYS Performed at [facility], [address] [place], [place] [address]    Report Status PENDING  Incomplete  Culture, blood (Routine x 2)     Status: None (Preliminary result)   Collection Time: 09/16/17 12:45 PM  Result Value Ref Range Status    Specimen Description BLOOD RIGHT HAND  Final   Special Requests   Final    BOTTLES DRAWN AEROBIC ONLY Blood Culture results may not be optimal due to an inadequate volume of blood received in culture bottles   Culture   Final    NO GROWTH 3 DAYS Performed at [facility], [address] [place], [place] [address]    Report Status PENDING  Incomplete  MRSA PCR Screening     Status: None   Collection Time: 09/18/17  1:25 AM  Result Value Ref Range Status   MRSA by PCR NEGATIVE NEGATIVE Final    Comment:        The GeneXpert MRSA Assay (FDA approved for NASAL specimens only), is one component of a comprehensive MRSA colonization surveillance program. It is not intended to diagnose MRSA infection nor to guide or monitor treatment for MRSA infections. Performed at [facility], [address], [place], [place] [address]      Labs: BNP (last 3 results) No results for input(s): BNP in the last 8760 hours. Basic Metabolic Panel: Recent Labs  Lab 09/16/17 1205 09/17/17 0558 09/18/17 0518 09/19/17 0706  NA 136 137 134* 137  K 4.6 4.9 4.3 4.8  CL 95* 95* 93* 93*  CO2 25 26 30 24   GLUCOSE 242* 160* 256* 139*  BUN 38* 47* 23* 34*  CREATININE 5.77* 6.74* 3.80* 5.49*  CALCIUM 9.1 9.3 8.4* 9.4  PHOS  --   --  2.9 4.8*   Liver Function Tests: Recent Labs  Lab 09/16/17 1205 09/17/17 0558 09/18/17 0518 09/19/17 0706  AST 49* 32  --   --   ALT 32 27  --   --   ALKPHOS 136* 117  --   --   BILITOT 0.7 0.9  --   --   PROT 7.6 7.0  --   --   ALBUMIN 2.6* 2.3* 2.1* 2.5*   No results for input(s): LIPASE, AMYLASE in the last  168 hours. No results for input(s): AMMONIA in the last 168 hours. CBC: Recent Labs  Lab 09/16/17 1205 09/17/17 0558 09/18/17 0518 09/19/17 0706  WBC 18.3* 19.0* 14.8* 15.7*  NEUTROABS 15.7*  --   --   --   HGB 9.3* 8.7* 8.5* 8.9*  HCT 31.5* 28.7* 27.9* 28.8*  MCV 89.2 87.5 87.2 87.8  PLT 315 322 350 394   Cardiac Enzymes: Recent Labs  Lab  09/16/17 1205 09/16/17 1818 09/16/17 2347 09/17/17 0558  TROPONINI 0.08* 0.06* 0.06* 0.06*   BNP: Invalid input(s): POCBNP CBG: Recent Labs  Lab 09/18/17 1133 09/18/17 1644 09/18/17 2134 09/19/17 0726 09/19/17 1126  GLUCAP 67 184* 90 113* 218*   D-Dimer No results for input(s): DDIMER in the last 72 hours. Hgb A1c No results for input(s): HGBA1C in the last 72 hours. Lipid Profile No results for input(s): CHOL, HDL, LDLCALC, TRIG, CHOLHDL, LDLDIRECT in the last 72 hours. Thyroid function studies Recent Labs    09/16/17 1818  TSH 2.151   Anemia work up No results for input(s): VITAMINB12, FOLATE, FERRITIN, TIBC, IRON, RETICCTPCT in the last 72 hours. Urinalysis    Component Value Date/Time   COLORURINE YELLOW 11/22/2008 2014   APPEARANCEUR CLEAR 11/22/2008 2014   LABSPEC 1.020 11/22/2008 2014   PHURINE 5.5 11/22/2008 2014   GLUCOSEU NEG mg/dL 11/22/2008 2014   HGBUR TRACE (A) 07/07/2007 0956   BILIRUBINUR NEG 11/22/2008 2014   KETONESUR NEG mg/dL 11/22/2008 2014   PROTEINUR 30 (A) 11/22/2008 2014   UROBILINOGEN 0.2 11/22/2008 2014   NITRITE NEG 11/22/2008 2014   LEUKOCYTESUR NEG 11/22/2008 2014   Sepsis Labs Invalid input(s): PROCALCITONIN,  WBC,  LACTICIDVEN Microbiology Recent Results (from the past 240 hour(s))  Culture, blood (Routine x 2)     Status: None (Preliminary result)   Collection Time: 09/16/17 12:45 PM  Result Value Ref Range Status   Specimen Description BLOOD RIGHT FOREARM  Final   Special Requests   Final    BOTTLES DRAWN AEROBIC AND ANAEROBIC Blood Culture adequate volume   Culture   Final    NO GROWTH 3 DAYS Performed at [facility], [address] [place], [place] [address]    Report Status PENDING  Incomplete  Culture, blood (Routine x 2)     Status: None (Preliminary result)   Collection Time: 09/16/17 12:45 PM  Result Value Ref Range Status   Specimen Description BLOOD RIGHT HAND  Final   Special Requests   Final    BOTTLES  DRAWN AEROBIC ONLY Blood Culture results may not be optimal due to an inadequate volume of blood received in culture bottles   Culture   Final    NO GROWTH 3 DAYS Performed at [facility], [address], [place], [place] [address]    Report Status PENDING  Incomplete  MRSA PCR Screening     Status: None   Collection Time: 09/18/17  1:25 AM  Result Value Ref Range Status   MRSA by PCR NEGATIVE NEGATIVE Final    Comment:        The GeneXpert MRSA Assay (FDA approved for NASAL specimens only), is one component of a comprehensive MRSA colonization surveillance program. It is not intended to diagnose MRSA infection nor to guide or monitor treatment for MRSA infections. Performed at [facility], [address], [place],  [address]      Time coordinating discharge: Over 30 minutes  SIGNED:   Louellen Molder, MD  [facility] 09/19/2017, 11:43 AM Pager  If 7PM-7AM, please contact night-coverage [email or web address] Password [email or web address]

## 2017-09-21 ENCOUNTER — Telehealth: Payer: Self-pay | Admitting: *Deleted

## 2017-09-21 DIAGNOSIS — I70248 Atherosclerosis of native arteries of left leg with ulceration of other part of lower left leg: Secondary | ICD-10-CM | POA: Diagnosis not present

## 2017-09-21 DIAGNOSIS — L97211 Non-pressure chronic ulcer of right calf limited to breakdown of skin: Secondary | ICD-10-CM | POA: Diagnosis not present

## 2017-09-21 DIAGNOSIS — I70232 Atherosclerosis of native arteries of right leg with ulceration of calf: Secondary | ICD-10-CM | POA: Diagnosis not present

## 2017-09-21 DIAGNOSIS — Z48812 Encounter for surgical aftercare following surgery on the circulatory system: Secondary | ICD-10-CM | POA: Diagnosis not present

## 2017-09-21 DIAGNOSIS — L97821 Non-pressure chronic ulcer of other part of left lower leg limited to breakdown of skin: Secondary | ICD-10-CM | POA: Diagnosis not present

## 2017-09-21 DIAGNOSIS — E1151 Type 2 diabetes mellitus with diabetic peripheral angiopathy without gangrene: Secondary | ICD-10-CM | POA: Diagnosis not present

## 2017-09-21 LAB — CULTURE, BLOOD (ROUTINE X 2)
Culture: NO GROWTH
Culture: NO GROWTH
SPECIAL REQUESTS: ADEQUATE

## 2017-09-21 NOTE — Patient Outreach (Signed)
[facility]) Care Management  09/21/2017  Destiny Boyd 01-11-1934 [id number]   Transition of care/Care coordination/Calls from Health Coach   CM received a call from Kreg Shropshire, [facility] health coach, to voice concerns and "immediate needs" discussed with Destiny Boyd, Destiny Boyd prior to call to Community CM.  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, limited breakdown of skin Kreg Shropshire, [facility] wellness coach, (ESRD) and CM reviewed these concerns related contact with Destiny Boyd for orders and items that may be able to come from [facility]    Community CM called and spoke with Destiny Boyd at Destiny Boyd office about DME needs (pads, glucose meter, wheelchair to get to dialysis safely, ramp (SW)), Boyd not having money, goals of care for 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  CM discussed cost of discounted store brand glucose meters at stores like [facility], [employer] and [employer]. Cm reviewed with Destiny Boyd on her computer the [facility] store brand with the glucose meter, test strips and lancets at a total cost of $34.82 Destiny Boyd shared concerns about calls to DSS about "Lights about  to be cut off" related to not being able to work since she is now the care giver to Destiny Boyd. She reports she was denied assistance.  Destiny Boyd confirms she has [facility] SW working with her and at this time does not need Destiny Boyd SW services.  Destiny Boyd confirms she was not able to purchase the bedside commode " We could not pay for it.  We could not afford it." after [facility] ordered it Destiny Boyd reports Destiny Boyd does not prefer a hospital bed "she likes her bed too much"  Destiny Boyd is also working with Destiny Boyd of [place] [phone]) and [facility] to get Medicaid for Destiny Boyd.  This DSS SW came to the home to visit them recently. Destiny Boyd never had any medicaid services in the past as confirmed by [facility] today. Also helping to get food stamps  Destiny Boyd mentions that Destiny Boyd is "getting around but not walking" Using her Rolator.   Destiny Boyd reports cleaning out Destiny Boyd's apartment recently with the patient's assistance and Destiny Kowalczyk will be permanently living with her.  Destiny Finkle is scheduled to go to dialysis on tomorrow, Tuesday 09/22/17 in the morning  [facility] Community CM scheduled for home visit with Destiny Los on Tuesday 09/22/17 afternoon  [place]. Lavina Hamman, RN, BSN, [place] Coordinator [phone] week day mobile

## 2017-09-22 ENCOUNTER — Other Ambulatory Visit: Payer: Self-pay | Admitting: *Deleted

## 2017-09-22 DIAGNOSIS — Z992 Dependence on renal dialysis: Secondary | ICD-10-CM | POA: Diagnosis not present

## 2017-09-22 DIAGNOSIS — N2581 Secondary hyperparathyroidism of renal origin: Secondary | ICD-10-CM | POA: Diagnosis not present

## 2017-09-22 DIAGNOSIS — D631 Anemia in chronic kidney disease: Secondary | ICD-10-CM | POA: Diagnosis not present

## 2017-09-22 DIAGNOSIS — D509 Iron deficiency anemia, unspecified: Secondary | ICD-10-CM | POA: Diagnosis not present

## 2017-09-22 DIAGNOSIS — N186 End stage renal disease: Secondary | ICD-10-CM | POA: Diagnosis not present

## 2017-09-22 NOTE — Patient Outreach (Signed)
Destiny Boyd Destiny Boyd) Care Management   09/22/2017  Destiny Boyd 02-09-1934 [id number]  Destiny Boyd is an 84 y.o. female with a PMH ofhypertension, hyperlipidemia, Diabetes type2, ESRD On Hemodialysis(Tuesdays,Thursdays, Saturdays), Paroxysmal atrial fibrillation, stroke, gout, GERD, colon cancer (cecum)Peripheral vascular disease,extensive calcification of entire arterial system. She underwent OA+PTA distal L SFA and OTA+PTA L peroneal artery, iron deficiency anemia, abdominal hysterectomy and multiple teeth extractions  Destiny Destiny Boyd was referred to Destiny Boyd on 09/02/17 and contacted on 09/04/17 to initiate the referred transition of care services assessment. She has an apartment but is now staying with her daughter, Destiny Boyd. She was also referred to Destiny Boyd staff.   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  Destiny Boyd was discharged on 1/17/18as confirmed by her Daughter, Destiny Boyd.  She has had 2 admission in the last 6 months. Her last admission was from 09/16/17 to 09/19/17 for atrial fibrillation with RVR, ESRD on dialysis, elevated troponin  This is the second home visit for Destiny Boyd   Subjective: see notes below  Objective:    Review of Systems  HENT: Negative.   Eyes: Negative.   Cardiovascular: Positive for claudication.  Gastrointestinal: Negative for abdominal pain, blood in stool, constipation, diarrhea, heartburn, melena, nausea and vomiting.  Genitourinary: Negative.  Negative for dysuria, flank pain, frequency, hematuria and urgency.  Musculoskeletal: Positive for joint pain. Negative for back pain, falls, myalgias and neck pain.  Skin: Negative.    Neurological: Positive for tingling, sensory change and weakness. Negative for dizziness, tremors, speech change, focal weakness, seizures, loss of consciousness and headaches.  Endo/Heme/Allergies: Negative for environmental allergies and polydipsia. Bruises/bleeds easily.  Psychiatric/Behavioral: Negative.  Negative for depression, hallucinations, memory loss, substance abuse and suicidal ideas. The patient is not nervous/anxious and does not have insomnia.     Physical Exam  Constitutional: She is oriented to person, place, and time. She appears well-developed and well-nourished.  HENT:  Head: Normocephalic.  Eyes: Pupils are equal, round, and reactive to light.  Neck: Normal range of motion.  Cardiovascular: Normal rate.  Respiratory: Effort normal.  GI: Soft. Bowel sounds are normal.  Musculoskeletal: She exhibits edema and tenderness.  Neurological: She is alert and oriented to person, place, and time.  Skin: Skin is warm and dry.  Psychiatric: She has a normal mood and affect. Her behavior is normal. Judgment and thought content normal.    Encounter Medications:   Outpatient Encounter Medications as of 09/22/2017  Medication Sig  . amLODipine (NORVASC) 10 MG tablet Take 1 tablet by mouth daily.  . [EXPIRED] amoxicillin-clavulanate (AUGMENTIN) 875-125 MG tablet Take 1 tablet by mouth every 12 (twelve) hours for 7 days.  Marland Kitchen aspirin EC 81 MG tablet Take 81 mg by mouth daily.  Marland Kitchen diltiazem (CARDIZEM CD) 120 MG 24 hr capsule Take 240 mg ( 2 tablets ) in th am, and take 120 mg ( 1 tablet)  in the pm (Patient taking differently: Take 120-240 mg by mouth See admin instructions. Take 240 mg ( 2 tablets ) in th am, and take 120 mg ( 1 tablet)  in the pm)  . metoprolol tartrate (LOPRESSOR) 50 MG tablet Take 1 tablet (50 mg total) by mouth 2 (two) times daily.  . multivitamin (RENA-VIT) TABS  tablet Take 1 tablet by mouth daily.  . pantoprazole (PROTONIX) 40 MG tablet Take 1 tablet (40 mg total)  by mouth daily.  . sevelamer carbonate (RENVELA) 800 MG tablet Take 3,200 mg by mouth 3 (three) times daily with meals. & 1600 by mouth with snacks  . TRADJENTA 5 MG TABS tablet Take 1 tablet by mouth daily.  . traMADol (ULTRAM) 50 MG tablet Take 100 mg by mouth at bedtime.   Marland Kitchen warfarin (COUMADIN) 2.5 MG tablet Take 2.5-5 mg by mouth. Per 09/16/17 warfarin note in chart:  2.5 mg Monday, Wednesday and Friday, and 5 mg Tuesday, Thursday, Saturday and sunday  . [DISCONTINUED] furosemide (LASIX) 40 MG tablet Take 1 tablet by mouth daily.  . [DISCONTINUED] hydrALAZINE (APRESOLINE) 50 MG tablet Take 1 tablet by mouth 2 (two) times daily.   No facility-administered encounter medications on file as of 09/22/2017.     Functional Status:   In your present state of health, do you have any difficulty performing the following activities: 09/16/2017 09/08/2017  Hearing? N N  Vision? N N  Difficulty concentrating or making decisions? Destiny Boyd  Walking or climbing stairs? Y Y  Dressing or bathing? Y Y  Doing errands, shopping? Destiny Boyd  Preparing Food and eating ? - Y  Using the Toilet? - N  In the past six months, have you accidently leaked urine? - N  Do you have problems with loss of bowel control? - N  Managing your Medications? - Y  Managing your Finances? - Y  Housekeeping or managing your Housekeeping? - Y  Some recent data might be hidden    Fall/Depression Screening:    Fall Risk  09/29/2017  Falls in the past year? No  Risk for fall due to : Impaired balance/gait;Medication side effect;Impaired mobility   PHQ 2/9 Scores 09/29/2017  PHQ - 2 Score 1    Assessment:    CM met Destiny Boyd and Destiny Boyd at Destiny Boyd home. She was noted to be moving her legs frequently and CM inquired about pain but she stated "right now I am feeling good" Destiny Boyd reports she had taken pain medicine. Cm encouraged use of pain medication as ordered on a timely basis to prevent from being in pain She was noted during the visit  to move her legs up and down frequently but would deny pain.  She was noted to have a puffy face but she and Destiny Boyd denied.  Destiny Boyd was noted to be less cheerful.  Hospice Cm discussed hospice and palliative care with Destiny Boyd but noted Destiny Boyd would whisper her answers.  Cm reminded her of the call from Kreg Shropshire. Cm encouraged them to have a conversation with Destiny Legrand Rams about palliative care on the follow up visit.  Destiny Boyd reports she would. She called Destiny Josephine Cables office during the home visit to verify the office visit as Destiny Reich was re admitted on 09/16/17 and discharged on 09/19/17 Updated J Lattie Haw stated "she needs to get stronger. We got places to go"   DME/Ramp Wheelchair CM called [facility] to discuss with Caryl Pina during the home visit but CM was referred to [facility] who is already Presenter, broadcasting with a ramp.    Home health Still being seen by advanced home care staff for wound care RN and therapy Destiny Boyd reported no physical therapist visit.  Cm called and spoke with staff to confirm that physical therapy would be arriving this week.  The last physical therapy visit was  on 09/16/17   Medicaid- Cm spoke with Vaughan Basta at [facility] to find out that the SW ws Traci and left a message for her to return a call to CM and Destiny Boyd.to check on the status of medicaid application CM discussed with Destiny Boyd that generally it takes 30 -45 days for Medicaid to begin to be processed.  She voiced understanding   Plan:  Follow up with Destiny Crilly and Destiny Boyd as requested for diet information in 2 weeks  Route not to MD care team listed in [place]. Lavina Hamman, RN, BSN, [place] Coordinator [phone] week day mobile

## 2017-09-23 ENCOUNTER — Ambulatory Visit: Payer: Self-pay | Admitting: *Deleted

## 2017-09-23 ENCOUNTER — Encounter: Payer: Medicare Other | Admitting: Vascular Surgery

## 2017-09-23 ENCOUNTER — Telehealth: Payer: Self-pay | Admitting: *Deleted

## 2017-09-23 ENCOUNTER — Ambulatory Visit (INDEPENDENT_AMBULATORY_CARE_PROVIDER_SITE_OTHER): Payer: Medicare Other | Admitting: *Deleted

## 2017-09-23 DIAGNOSIS — I4891 Unspecified atrial fibrillation: Secondary | ICD-10-CM

## 2017-09-23 DIAGNOSIS — Z48812 Encounter for surgical aftercare following surgery on the circulatory system: Secondary | ICD-10-CM | POA: Diagnosis not present

## 2017-09-23 DIAGNOSIS — E1151 Type 2 diabetes mellitus with diabetic peripheral angiopathy without gangrene: Secondary | ICD-10-CM | POA: Diagnosis not present

## 2017-09-23 DIAGNOSIS — I70232 Atherosclerosis of native arteries of right leg with ulceration of calf: Secondary | ICD-10-CM | POA: Diagnosis not present

## 2017-09-23 DIAGNOSIS — Z7901 Long term (current) use of anticoagulants: Secondary | ICD-10-CM | POA: Diagnosis not present

## 2017-09-23 DIAGNOSIS — L97821 Non-pressure chronic ulcer of other part of left lower leg limited to breakdown of skin: Secondary | ICD-10-CM | POA: Diagnosis not present

## 2017-09-23 DIAGNOSIS — I70248 Atherosclerosis of native arteries of left leg with ulceration of other part of lower left leg: Secondary | ICD-10-CM | POA: Diagnosis not present

## 2017-09-23 DIAGNOSIS — L97211 Non-pressure chronic ulcer of right calf limited to breakdown of skin: Secondary | ICD-10-CM | POA: Diagnosis not present

## 2017-09-23 LAB — POCT INR: INR: 2.8

## 2017-09-23 NOTE — Telephone Encounter (Signed)
INR 2.8 per Judson Roch w/ [facility] [phone]

## 2017-09-23 NOTE — Telephone Encounter (Signed)
Call from [facility] with [place]. Patient was just released from [facility] and wound care nurse ordered Santyl ointment for patient. States this is like debridement action. Claims patient's Left great toe and 5th toe are dusky foot is somewhat warm and pulses are weak but PRESENT. Decided to keep dry dressing on till patient sees Dr. Bridgett Larsson on Friday in office. Daughter will accompany patient to office and ask about this ointment.Marland Kitchen

## 2017-09-23 NOTE — Progress Notes (Signed)
Done.  See coumadin dose.

## 2017-09-23 NOTE — Patient Instructions (Signed)
Hold coumadin tonight then decrease dose to 2.5mg  daily except 5mg  on Wednesdays and Saturdays Recheck in 1 week Order given to [place]

## 2017-09-23 NOTE — Telephone Encounter (Signed)
INR 2.8 per Judson Roch w/ [facility] [phone]

## 2017-09-23 NOTE — Telephone Encounter (Signed)
Done.  See coumadin note. 

## 2017-09-24 ENCOUNTER — Other Ambulatory Visit: Payer: Self-pay | Admitting: *Deleted

## 2017-09-24 DIAGNOSIS — L97211 Non-pressure chronic ulcer of right calf limited to breakdown of skin: Secondary | ICD-10-CM | POA: Diagnosis not present

## 2017-09-24 DIAGNOSIS — D631 Anemia in chronic kidney disease: Secondary | ICD-10-CM | POA: Diagnosis not present

## 2017-09-24 DIAGNOSIS — N2581 Secondary hyperparathyroidism of renal origin: Secondary | ICD-10-CM | POA: Diagnosis not present

## 2017-09-24 DIAGNOSIS — Z48812 Encounter for surgical aftercare following surgery on the circulatory system: Secondary | ICD-10-CM | POA: Diagnosis not present

## 2017-09-24 DIAGNOSIS — L97821 Non-pressure chronic ulcer of other part of left lower leg limited to breakdown of skin: Secondary | ICD-10-CM | POA: Diagnosis not present

## 2017-09-24 DIAGNOSIS — E1151 Type 2 diabetes mellitus with diabetic peripheral angiopathy without gangrene: Secondary | ICD-10-CM | POA: Diagnosis not present

## 2017-09-24 DIAGNOSIS — Z992 Dependence on renal dialysis: Secondary | ICD-10-CM | POA: Diagnosis not present

## 2017-09-24 DIAGNOSIS — N186 End stage renal disease: Secondary | ICD-10-CM | POA: Diagnosis not present

## 2017-09-24 DIAGNOSIS — I70232 Atherosclerosis of native arteries of right leg with ulceration of calf: Secondary | ICD-10-CM | POA: Diagnosis not present

## 2017-09-24 DIAGNOSIS — D509 Iron deficiency anemia, unspecified: Secondary | ICD-10-CM | POA: Diagnosis not present

## 2017-09-24 DIAGNOSIS — I70248 Atherosclerosis of native arteries of left leg with ulceration of other part of lower left leg: Secondary | ICD-10-CM | POA: Diagnosis not present

## 2017-09-24 NOTE — Patient Outreach (Signed)
[facility]) Care Management  09/24/2017  [place] JUSTINA BERTINI 1934/06/02 [id number]   Care coordination   CM received a voice message from Tonia Ghent at Dr Legrand Rams office after discussing palliative/hospice care with Dr Legrand Rams Dr Josephine Cables preference is to discuss this care option with Mrs Pint and [name] during the 09/28/17 office hospital follow up visit   CM received a call and message from Kennis Carina, [facility] ESRD staff about updates on Mrs Friberg.  CM returned call and left a message that [place] had been contacted about the ramp and Dr Legrand Rams office is aware of the palliative consult and pending Dr Josephine Cables preference to address but Tonia Ghent, Dr Legrand Rams RN offered [facility] and [name] was given [facility] contact information during the last home visit. Return message from [place] for updated and offer to assist with glucose meter if they have financial hardship  Plans Cm will see Mrs Coor on next week for home visit and follow up MD office visit and DME   Joelene Millin L. Lavina Hamman, RN, BSN, [place] Coordinator [phone] week day mobile

## 2017-09-25 ENCOUNTER — Ambulatory Visit (INDEPENDENT_AMBULATORY_CARE_PROVIDER_SITE_OTHER): Payer: Medicare Other | Admitting: Vascular Surgery

## 2017-09-25 ENCOUNTER — Other Ambulatory Visit: Payer: Self-pay

## 2017-09-25 ENCOUNTER — Encounter: Payer: Self-pay | Admitting: Vascular Surgery

## 2017-09-25 VITALS — BP 98/60 | HR 69 | Temp 98.1°F | Resp 14 | Ht 63.0 in | Wt 112.0 lb

## 2017-09-25 DIAGNOSIS — I7025 Atherosclerosis of native arteries of other extremities with ulceration: Secondary | ICD-10-CM

## 2017-09-25 DIAGNOSIS — L97911 Non-pressure chronic ulcer of unspecified part of right lower leg limited to breakdown of skin: Secondary | ICD-10-CM

## 2017-09-25 NOTE — Progress Notes (Signed)
Postoperative Visit ([place])   History of Present Illness   Destiny Boyd is a 84 y.o. female who presents cc:  Left leg turning black.  Prior procedure include: 1.  OA+PTA L SFA, OA+PTA L peroneal, skin bx x 2 (08/31/17)  Patient's rest pain has completely resolved but the left foot continued to worsen, turning black.  Pt is taking abx at this point.  Past Medical History, Past Surgical History, Social History, Family History, Medications, Allergies, and Review of Systems are unchanged from previous evaluation on 09/03/17.  Current Outpatient Medications  Medication Sig Dispense Refill  . amoxicillin-clavulanate (AUGMENTIN) 875-125 MG tablet Take 1 tablet by mouth every 12 (twelve) hours for 7 days. 14 tablet 0  . aspirin EC 81 MG tablet Take 81 mg by mouth daily.    Marland Kitchen diltiazem (CARDIZEM CD) 120 MG 24 hr capsule Take 240 mg ( 2 tablets ) in th am, and take 120 mg ( 1 tablet)  in the pm (Patient taking differently: Take 120-240 mg by mouth See admin instructions. Take 240 mg ( 2 tablets ) in th am, and take 120 mg ( 1 tablet)  in the pm) 90 capsule 3  . furosemide (LASIX) 40 MG tablet Take 1 tablet by mouth daily.    . metoprolol tartrate (LOPRESSOR) 50 MG tablet Take 1 tablet (50 mg total) by mouth 2 (two) times daily. 60 tablet 0  . multivitamin (RENA-VIT) TABS tablet Take 1 tablet by mouth daily.    . pantoprazole (PROTONIX) 40 MG tablet Take 1 tablet (40 mg total) by mouth daily. 30 tablet 3  . sevelamer carbonate (RENVELA) 800 MG tablet Take 3,200 mg by mouth 3 (three) times daily with meals. & 1600 by mouth with snacks    . TRADJENTA 5 MG TABS tablet Take 1 tablet by mouth daily.    . traMADol (ULTRAM) 50 MG tablet Take 100 mg by mouth at bedtime.     Marland Kitchen warfarin (COUMADIN) 2.5 MG tablet Take 2.5-5 mg by mouth. Per 09/16/17 warfarin note in chart:  2.5 mg Monday, Wednesday and Friday, and 5 mg Tuesday, Thursday, Saturday and sunday    . amLODipine (NORVASC) 10 MG tablet Take 1  tablet by mouth daily.    . hydrALAZINE (APRESOLINE) 50 MG tablet Take 1 tablet by mouth 2 (two) times daily.     No current facility-administered medications for this visit.     ROS: progression in gangrene in L foot, no pain, no fever or chills   For VQI Use Only   PRE-ADM LIVING: Home  AMB STATUS: Wheelchair   Physical Examination   Vitals:   09/25/17 1006 09/25/17 1012  BP: 96/60 98/60  Pulse: 69 69  Resp: 14   Temp: 98.1 F (36.7 C)   TempSrc: Oral   SpO2: 91%   Weight: 112 lb (50.8 kg)   Height: 5\' 3"  (1.6 m)    Body mass index is 19.84 kg/m.  General Alert, O x 3, WD, NAD  Pulmonary Sym exp, good B air movt, CTA B  Cardiac RRR, Nl S1, S2, no Murmurs, No rubs, No S3,S4  Vascular Vessel Right Left  Radial Palpable Palpable  Brachial Palpable Palpable  Carotid Palpable, No Bruit Palpable, No Bruit  Aorta Not palpable N/A  Femoral Palpable Palpable  Popliteal Not palpable Not palpable  PT Not palpable Not palpable  DP Not palpable Not palpable    Gastrointestinal soft, non-distended, non-tender to palpation, No guarding or rebound, no  HSM, no masses, no CVAT B, No palpable prominent aortic pulse,    Musculoskeletal M/S 5/5 in arms, BLE not tested, R leg with few scatter black eschars, L lateral calf has progressed to extended area of black eschar, L 5th toe with gangrene changes, mid-foot plantar surface appears gangrene, no drainage, skin appears blistered in a few spots, easily dopplerable peroneal, PT and DP signals  Neurologic  Pain and light touch intact in extremities except for decreased sensation in B feet, Motor exam as listed above    Medical Decision Making   Destiny Boyd is a 84 y.o. female who presents s/p L SFA and peroneal OA+PTA.   While the endovascular intervention was successful, as evident in resolution of rest pain, the progression in tissue loss demonstrates this was not enough.  The patient has been very clear: NO  AMPUTATIONS.  Would proceed with a Palliative approach: wound care, abx, and pain rx as needed.  In regards to wound care: wash left foot daily.  Sterilize skin with Betadine to ischemic areas.  Wrap left foot with Kerlix to cushion.  In the event, pain becomes an issue may need to refer to Hospice at that time.  Will check on patient again in 3 months.  Thank you for allowing [place] to participate in this patient's care.   Adele Barthel, MD, FACS [facility] Office: [phone] Pager: [phone]

## 2017-09-26 DIAGNOSIS — N2581 Secondary hyperparathyroidism of renal origin: Secondary | ICD-10-CM | POA: Diagnosis not present

## 2017-09-26 DIAGNOSIS — Z992 Dependence on renal dialysis: Secondary | ICD-10-CM | POA: Diagnosis not present

## 2017-09-26 DIAGNOSIS — D631 Anemia in chronic kidney disease: Secondary | ICD-10-CM | POA: Diagnosis not present

## 2017-09-26 DIAGNOSIS — N186 End stage renal disease: Secondary | ICD-10-CM | POA: Diagnosis not present

## 2017-09-26 DIAGNOSIS — D509 Iron deficiency anemia, unspecified: Secondary | ICD-10-CM | POA: Diagnosis not present

## 2017-09-28 ENCOUNTER — Telehealth: Payer: Self-pay | Admitting: Vascular Surgery

## 2017-09-28 DIAGNOSIS — L97901 Non-pressure chronic ulcer of unspecified part of unspecified lower leg limited to breakdown of skin: Secondary | ICD-10-CM | POA: Diagnosis not present

## 2017-09-28 DIAGNOSIS — I70232 Atherosclerosis of native arteries of right leg with ulceration of calf: Secondary | ICD-10-CM | POA: Diagnosis not present

## 2017-09-28 DIAGNOSIS — Z48812 Encounter for surgical aftercare following surgery on the circulatory system: Secondary | ICD-10-CM | POA: Diagnosis not present

## 2017-09-28 DIAGNOSIS — I70248 Atherosclerosis of native arteries of left leg with ulceration of other part of lower left leg: Secondary | ICD-10-CM | POA: Diagnosis not present

## 2017-09-28 DIAGNOSIS — E1151 Type 2 diabetes mellitus with diabetic peripheral angiopathy without gangrene: Secondary | ICD-10-CM | POA: Diagnosis not present

## 2017-09-28 DIAGNOSIS — E1165 Type 2 diabetes mellitus with hyperglycemia: Secondary | ICD-10-CM | POA: Diagnosis not present

## 2017-09-28 DIAGNOSIS — L97821 Non-pressure chronic ulcer of other part of left lower leg limited to breakdown of skin: Secondary | ICD-10-CM | POA: Diagnosis not present

## 2017-09-28 DIAGNOSIS — N186 End stage renal disease: Secondary | ICD-10-CM | POA: Diagnosis not present

## 2017-09-28 DIAGNOSIS — I482 Chronic atrial fibrillation: Secondary | ICD-10-CM | POA: Diagnosis not present

## 2017-09-28 DIAGNOSIS — L97211 Non-pressure chronic ulcer of right calf limited to breakdown of skin: Secondary | ICD-10-CM | POA: Diagnosis not present

## 2017-09-28 NOTE — Telephone Encounter (Signed)
I called and spoke to the nurse at the [place] [facility] [place] office.  Per Dr. Bridgett Larsson, new orders are:   1-Wash leg with soap and water daily 2-Betadine paint to black eschar and blistered skin 3-Wrap left leg with Kerlix.  Someone will come out to teach patient and her family how to do daily dressing changes.  Brandye Inthavong E., LPN.

## 2017-09-29 ENCOUNTER — Other Ambulatory Visit: Payer: Self-pay | Admitting: *Deleted

## 2017-09-29 DIAGNOSIS — N186 End stage renal disease: Secondary | ICD-10-CM | POA: Diagnosis not present

## 2017-09-29 DIAGNOSIS — N2581 Secondary hyperparathyroidism of renal origin: Secondary | ICD-10-CM | POA: Diagnosis not present

## 2017-09-29 DIAGNOSIS — D509 Iron deficiency anemia, unspecified: Secondary | ICD-10-CM | POA: Diagnosis not present

## 2017-09-29 DIAGNOSIS — Z992 Dependence on renal dialysis: Secondary | ICD-10-CM | POA: Diagnosis not present

## 2017-09-29 DIAGNOSIS — D631 Anemia in chronic kidney disease: Secondary | ICD-10-CM | POA: Diagnosis not present

## 2017-09-30 ENCOUNTER — Ambulatory Visit (INDEPENDENT_AMBULATORY_CARE_PROVIDER_SITE_OTHER): Payer: Medicare Other | Admitting: *Deleted

## 2017-09-30 DIAGNOSIS — I70232 Atherosclerosis of native arteries of right leg with ulceration of calf: Secondary | ICD-10-CM | POA: Diagnosis not present

## 2017-09-30 DIAGNOSIS — E1151 Type 2 diabetes mellitus with diabetic peripheral angiopathy without gangrene: Secondary | ICD-10-CM | POA: Diagnosis not present

## 2017-09-30 DIAGNOSIS — I4891 Unspecified atrial fibrillation: Secondary | ICD-10-CM | POA: Diagnosis not present

## 2017-09-30 DIAGNOSIS — I70248 Atherosclerosis of native arteries of left leg with ulceration of other part of lower left leg: Secondary | ICD-10-CM | POA: Diagnosis not present

## 2017-09-30 DIAGNOSIS — Z7901 Long term (current) use of anticoagulants: Secondary | ICD-10-CM

## 2017-09-30 DIAGNOSIS — L97211 Non-pressure chronic ulcer of right calf limited to breakdown of skin: Secondary | ICD-10-CM | POA: Diagnosis not present

## 2017-09-30 DIAGNOSIS — L97821 Non-pressure chronic ulcer of other part of left lower leg limited to breakdown of skin: Secondary | ICD-10-CM | POA: Diagnosis not present

## 2017-09-30 DIAGNOSIS — Z48812 Encounter for surgical aftercare following surgery on the circulatory system: Secondary | ICD-10-CM | POA: Diagnosis not present

## 2017-09-30 LAB — POCT INR: INR: 4.7

## 2017-09-30 NOTE — Patient Instructions (Signed)
Hold coumadin tonight and tomorrow night then decrease dose to 2.5mg  daily Recheck on Monday Order given to [place]

## 2017-10-01 ENCOUNTER — Encounter (HOSPITAL_COMMUNITY): Payer: Self-pay | Admitting: Emergency Medicine

## 2017-10-01 ENCOUNTER — Inpatient Hospital Stay (HOSPITAL_COMMUNITY)
Admission: EM | Admit: 2017-10-01 | Discharge: 2017-10-16 | DRG: 064 | Disposition: E | Payer: Medicare Other | Attending: Family Medicine | Admitting: Family Medicine

## 2017-10-01 ENCOUNTER — Emergency Department (HOSPITAL_COMMUNITY): Payer: Medicare Other

## 2017-10-01 ENCOUNTER — Telehealth: Payer: Self-pay | Admitting: *Deleted

## 2017-10-01 ENCOUNTER — Other Ambulatory Visit: Payer: Self-pay | Admitting: *Deleted

## 2017-10-01 ENCOUNTER — Other Ambulatory Visit: Payer: Self-pay

## 2017-10-01 DIAGNOSIS — N186 End stage renal disease: Secondary | ICD-10-CM | POA: Diagnosis present

## 2017-10-01 DIAGNOSIS — D631 Anemia in chronic kidney disease: Secondary | ICD-10-CM | POA: Diagnosis present

## 2017-10-01 DIAGNOSIS — Z8673 Personal history of transient ischemic attack (TIA), and cerebral infarction without residual deficits: Secondary | ICD-10-CM

## 2017-10-01 DIAGNOSIS — I6200 Nontraumatic subdural hemorrhage, unspecified: Principal | ICD-10-CM | POA: Diagnosis present

## 2017-10-01 DIAGNOSIS — R531 Weakness: Secondary | ICD-10-CM | POA: Diagnosis not present

## 2017-10-01 DIAGNOSIS — Z9842 Cataract extraction status, left eye: Secondary | ICD-10-CM | POA: Diagnosis not present

## 2017-10-01 DIAGNOSIS — I120 Hypertensive chronic kidney disease with stage 5 chronic kidney disease or end stage renal disease: Secondary | ICD-10-CM | POA: Diagnosis present

## 2017-10-01 DIAGNOSIS — E1152 Type 2 diabetes mellitus with diabetic peripheral angiopathy with gangrene: Secondary | ICD-10-CM | POA: Diagnosis present

## 2017-10-01 DIAGNOSIS — Z992 Dependence on renal dialysis: Secondary | ICD-10-CM | POA: Diagnosis not present

## 2017-10-01 DIAGNOSIS — Z7901 Long term (current) use of anticoagulants: Secondary | ICD-10-CM | POA: Diagnosis not present

## 2017-10-01 DIAGNOSIS — I96 Gangrene, not elsewhere classified: Secondary | ICD-10-CM | POA: Diagnosis present

## 2017-10-01 DIAGNOSIS — Z66 Do not resuscitate: Secondary | ICD-10-CM | POA: Diagnosis present

## 2017-10-01 DIAGNOSIS — Z7982 Long term (current) use of aspirin: Secondary | ICD-10-CM | POA: Diagnosis not present

## 2017-10-01 DIAGNOSIS — E7800 Pure hypercholesterolemia, unspecified: Secondary | ICD-10-CM | POA: Diagnosis present

## 2017-10-01 DIAGNOSIS — Z515 Encounter for palliative care: Secondary | ICD-10-CM | POA: Diagnosis present

## 2017-10-01 DIAGNOSIS — Z85038 Personal history of other malignant neoplasm of large intestine: Secondary | ICD-10-CM

## 2017-10-01 DIAGNOSIS — I480 Paroxysmal atrial fibrillation: Secondary | ICD-10-CM | POA: Diagnosis present

## 2017-10-01 DIAGNOSIS — Z961 Presence of intraocular lens: Secondary | ICD-10-CM | POA: Diagnosis present

## 2017-10-01 DIAGNOSIS — Z9049 Acquired absence of other specified parts of digestive tract: Secondary | ICD-10-CM

## 2017-10-01 DIAGNOSIS — R29734 NIHSS score 34: Secondary | ICD-10-CM | POA: Diagnosis present

## 2017-10-01 DIAGNOSIS — Z8249 Family history of ischemic heart disease and other diseases of the circulatory system: Secondary | ICD-10-CM

## 2017-10-01 DIAGNOSIS — M109 Gout, unspecified: Secondary | ICD-10-CM | POA: Diagnosis present

## 2017-10-01 DIAGNOSIS — R29818 Other symptoms and signs involving the nervous system: Secondary | ICD-10-CM | POA: Diagnosis not present

## 2017-10-01 DIAGNOSIS — D509 Iron deficiency anemia, unspecified: Secondary | ICD-10-CM | POA: Diagnosis not present

## 2017-10-01 DIAGNOSIS — K219 Gastro-esophageal reflux disease without esophagitis: Secondary | ICD-10-CM | POA: Diagnosis present

## 2017-10-01 DIAGNOSIS — Z9841 Cataract extraction status, right eye: Secondary | ICD-10-CM | POA: Diagnosis not present

## 2017-10-01 DIAGNOSIS — I69992 Facial weakness following unspecified cerebrovascular disease: Secondary | ICD-10-CM | POA: Diagnosis not present

## 2017-10-01 DIAGNOSIS — I6789 Other cerebrovascular disease: Secondary | ICD-10-CM | POA: Diagnosis not present

## 2017-10-01 DIAGNOSIS — I619 Nontraumatic intracerebral hemorrhage, unspecified: Secondary | ICD-10-CM

## 2017-10-01 DIAGNOSIS — Z90710 Acquired absence of both cervix and uterus: Secondary | ICD-10-CM | POA: Diagnosis not present

## 2017-10-01 DIAGNOSIS — Z87898 Personal history of other specified conditions: Secondary | ICD-10-CM

## 2017-10-01 DIAGNOSIS — Z87891 Personal history of nicotine dependence: Secondary | ICD-10-CM | POA: Diagnosis not present

## 2017-10-01 DIAGNOSIS — Z833 Family history of diabetes mellitus: Secondary | ICD-10-CM

## 2017-10-01 DIAGNOSIS — N2581 Secondary hyperparathyroidism of renal origin: Secondary | ICD-10-CM | POA: Diagnosis not present

## 2017-10-01 DIAGNOSIS — E1122 Type 2 diabetes mellitus with diabetic chronic kidney disease: Secondary | ICD-10-CM | POA: Diagnosis present

## 2017-10-01 DIAGNOSIS — R4182 Altered mental status, unspecified: Secondary | ICD-10-CM | POA: Diagnosis not present

## 2017-10-01 LAB — I-STAT CHEM 8, ED
BUN: 17 mg/dL (ref 6–20)
CHLORIDE: 99 mmol/L — AB (ref 101–111)
Calcium, Ion: 1 mmol/L — ABNORMAL LOW (ref 1.15–1.40)
Creatinine, Ser: 3.4 mg/dL — ABNORMAL HIGH (ref 0.44–1.00)
Glucose, Bld: 250 mg/dL — ABNORMAL HIGH (ref 65–99)
HEMATOCRIT: 28 % — AB (ref 36.0–46.0)
Hemoglobin: 9.5 g/dL — ABNORMAL LOW (ref 12.0–15.0)
POTASSIUM: 5.4 mmol/L — AB (ref 3.5–5.1)
SODIUM: 134 mmol/L — AB (ref 135–145)
TCO2: 29 mmol/L (ref 22–32)

## 2017-10-01 LAB — COMPREHENSIVE METABOLIC PANEL
ALT: 19 U/L (ref 14–54)
AST: 20 U/L (ref 15–41)
Albumin: 2.3 g/dL — ABNORMAL LOW (ref 3.5–5.0)
Alkaline Phosphatase: 100 U/L (ref 38–126)
Anion gap: 17 — ABNORMAL HIGH (ref 5–15)
BUN: 15 mg/dL (ref 6–20)
CO2: 25 mmol/L (ref 22–32)
CREATININE: 3.49 mg/dL — AB (ref 0.44–1.00)
Calcium: 9 mg/dL (ref 8.9–10.3)
Chloride: 94 mmol/L — ABNORMAL LOW (ref 101–111)
GFR, EST AFRICAN AMERICAN: 13 mL/min — AB (ref >60)
GFR, EST NON AFRICAN AMERICAN: 11 mL/min — AB (ref >60)
Glucose, Bld: 249 mg/dL — ABNORMAL HIGH (ref 65–99)
POTASSIUM: 3.6 mmol/L (ref 3.5–5.1)
SODIUM: 136 mmol/L (ref 135–145)
Total Bilirubin: 0.6 mg/dL (ref 0.3–1.2)
Total Protein: 7.8 g/dL (ref 6.5–8.1)

## 2017-10-01 LAB — CBC
HCT: 26.8 % — ABNORMAL LOW (ref 36.0–46.0)
Hemoglobin: 8.1 g/dL — ABNORMAL LOW (ref 12.0–15.0)
MCH: 25.5 pg — ABNORMAL LOW (ref 26.0–34.0)
MCHC: 30.2 g/dL (ref 30.0–36.0)
MCV: 84.3 fL (ref 78.0–100.0)
PLATELETS: 384 10*3/uL (ref 150–400)
RBC: 3.18 MIL/uL — AB (ref 3.87–5.11)
RDW: 15.7 % — AB (ref 11.5–15.5)
WBC: 18.9 10*3/uL — AB (ref 4.0–10.5)

## 2017-10-01 LAB — DIFFERENTIAL
Basophils Absolute: 0 10*3/uL (ref 0.0–0.1)
Basophils Relative: 0 %
EOS ABS: 0 10*3/uL (ref 0.0–0.7)
Eosinophils Relative: 0 %
LYMPHS PCT: 9 %
Lymphs Abs: 1.7 10*3/uL (ref 0.7–4.0)
MONO ABS: 1.1 10*3/uL — AB (ref 0.1–1.0)
Monocytes Relative: 6 %
NEUTROS PCT: 85 %
Neutro Abs: 16.1 10*3/uL — ABNORMAL HIGH (ref 1.7–7.7)

## 2017-10-01 LAB — I-STAT TROPONIN, ED: TROPONIN I, POC: 0.42 ng/mL — AB (ref 0.00–0.08)

## 2017-10-01 LAB — ETHANOL

## 2017-10-01 LAB — PROTIME-INR
INR: 4.48 — AB
PROTHROMBIN TIME: 42.3 s — AB (ref 11.4–15.2)

## 2017-10-01 LAB — APTT: APTT: 91 s — AB (ref 24–36)

## 2017-10-01 LAB — CBG MONITORING, ED: GLUCOSE-CAPILLARY: 237 mg/dL — AB (ref 65–99)

## 2017-10-01 MED ORDER — LORAZEPAM 2 MG/ML IJ SOLN: 2.0000 mg | INTRAMUSCULAR | Status: DC | PRN

## 2017-10-01 MED ORDER — FENTANYL CITRATE (PF) 100 MCG/2ML IJ SOLN: 50.0000 ug | INTRAMUSCULAR | Status: DC | PRN

## 2017-10-02 ENCOUNTER — Other Ambulatory Visit: Payer: Self-pay | Admitting: *Deleted

## 2017-10-02 NOTE — Patient Outreach (Signed)
[place] [facility]) Care Management  10/02/2017  Destiny Boyd 1934/08/11 [id number]   Case closure  [facility] (CM) noted in chart pt passed on 2017-10-29 at 16 in the [facility] ED  Plans case closure -Patient deceased CM updated [facility] CMA Case closure letter sent to Dr Renato Battles L. Lavina Hamman, RN, BSN, [place] Coordinator [phone] week day mobile

## 2017-10-06 NOTE — Addendum Note (Signed)
Addended by: Virgel Manifold on: 10/06/2017 02:32 PM   Modules accepted: Level of Service, SmartSet

## 2017-10-06 NOTE — Progress Notes (Signed)
This encounter was created in error - please disregard.

## 2017-10-13 ENCOUNTER — Ambulatory Visit: Payer: Medicare Other | Admitting: *Deleted

## 2017-10-16 NOTE — ED Triage Notes (Signed)
Pt brought in by ems for evaluation of altered mental status and right sided weakness.  Pt has resolution of right sided weakness, but remains aphasic.  Dr. Lacinda Axon notified on arrival and code stroke called.

## 2017-10-16 NOTE — Patient Outreach (Addendum)
[facility]) Care Management   09/29/2017  Destiny Boyd 1934-06-14 [id number]  Destiny Boyd is an 84 y.o. female with a PMH of hypertension, hyperlipidemia, Diabetes type 2, ESRD On Hemodialysis (Tuesdays,Thursdays, Saturdays), Paroxysmal atrial fibrillation, stroke, gout, GERD, colon cancer (cecum) Peripheral vascular disease, extensive calcification of entire arterial system. She underwent OA+PTA distal L SFA and OTA+PTA L peroneal artery, iron deficiency anemia, abdominal hysterectomy and multiple teeth extractions  Destiny Boyd was referred to [facility] on 09/02/17 and contacted on 09/04/17 to initiate the referred transition of care services assessment.  She has an apartment but is now staying with her daughter, Destiny Boyd.  She was also referred to [place] staff.   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  Destiny Boyd was discharged on 09/03/16 as confirmed by her Daughter, Destiny Boyd.  She has had 2 admission in the last 6 months.  Her last admission was from 09/16/17 to 09/19/17 for atrial fibrillation with RVR, ESRD on dialysis, elevated troponin  This is the third home visit for Destiny Boyd   Subjective:  See noted below  Objective:   Review of Systems  Constitutional: Positive for malaise/fatigue. Negative for chills, diaphoresis, fever and weight loss.  HENT: Negative for congestion, ear discharge, ear pain, hearing loss, nosebleeds, sinus pain, sore throat and tinnitus.   Eyes: Negative for blurred vision, double vision, photophobia, pain, discharge and redness.  Respiratory: Negative for cough, hemoptysis, sputum production, shortness of breath, wheezing and stridor.    Cardiovascular: Positive for claudication and leg swelling. Negative for chest pain, palpitations, orthopnea and PND.  Gastrointestinal: Negative for abdominal pain, blood in stool, constipation, diarrhea, heartburn, melena, nausea and vomiting.  Genitourinary: Negative.  Negative for dysuria, flank pain, frequency, hematuria and urgency.  Musculoskeletal: Positive for joint pain and myalgias. Negative for back pain, falls and neck pain.  Skin: Positive for rash. Negative for itching.  Neurological: Positive for tingling, sensory change and weakness. Negative for dizziness, tremors, speech change, focal weakness, seizures, loss of consciousness and headaches.  Endo/Heme/Allergies: Negative for environmental allergies and polydipsia. Bruises/bleeds easily.  Psychiatric/Behavioral: Negative for depression, hallucinations, memory loss, substance abuse and suicidal ideas. The patient is not nervous/anxious and does not have insomnia.     Physical Exam  HENT:  Head: Normocephalic and atraumatic.  Eyes: Conjunctivae are normal. Pupils are equal, round, and reactive to light.  Neck: Normal range of motion. Neck supple.  Cardiovascular: Normal rate and regular rhythm.  Musculoskeletal: She exhibits edema and tenderness.    Encounter Medications:   No facility-administered encounter medications on file as of 09/29/2017.    Outpatient Encounter Medications as of 09/29/2017  Medication Sig  . amLODipine (NORVASC) 10 MG tablet Take 1 tablet by mouth daily.  Marland Kitchen aspirin EC 81 MG tablet Take 81 mg by mouth daily.  Marland Kitchen diltiazem (CARDIZEM CD) 120 MG 24 hr capsule Take 240 mg ( 2 tablets ) in th am, and take 120 mg ( 1 tablet)  in the pm (Patient taking differently: Take 120-240 mg by mouth See admin instructions. Take 240 mg ( 2 tablets ) in th am, and take 120 mg ( 1 tablet)  in the pm)  . metoprolol tartrate (LOPRESSOR) 50 MG tablet Take  1 tablet (50 mg total) by mouth 2 (two) times daily.  . multivitamin  (RENA-VIT) TABS tablet Take 1 tablet by mouth daily.  . pantoprazole (PROTONIX) 40 MG tablet Take 1 tablet (40 mg total) by mouth daily.  . sevelamer carbonate (RENVELA) 800 MG tablet Take 3,200 mg by mouth 3 (three) times daily with meals. & 1600 by mouth with snacks  . TRADJENTA 5 MG TABS tablet Take 1 tablet by mouth daily.  . traMADol (ULTRAM) 50 MG tablet Take 100 mg by mouth at bedtime.   Marland Kitchen warfarin (COUMADIN) 2.5 MG tablet Take 2.5-5 mg by mouth. Per 09/16/17 warfarin note in chart:  2.5 mg Monday, Wednesday and Friday, and 5 mg Tuesday, Thursday, Saturday and sunday  . [DISCONTINUED] furosemide (LASIX) 40 MG tablet Take 1 tablet by mouth daily.  . [DISCONTINUED] hydrALAZINE (APRESOLINE) 50 MG tablet Take 1 tablet by mouth 2 (two) times daily.    Functional Status:   In your present state of health, do you have any difficulty performing the following activities: 09/16/2017 09/08/2017  Hearing? N N  Vision? N N  Difficulty concentrating or making decisions? Tempie Donning  Walking or climbing stairs? Y Y  Dressing or bathing? Y Y  Doing errands, shopping? Tempie Donning  Preparing Food and eating ? - Y  Using the Toilet? - N  In the past six months, have you accidently leaked urine? - N  Do you have problems with loss of bowel control? - N  Managing your Medications? - Y  Managing your Finances? - Y  Housekeeping or managing your Housekeeping? - Y  Some recent data might be hidden    Fall/Depression Screening:    Fall Risk  09/29/2017  Falls in the past year? No  Risk for fall due to : Impaired balance/gait;Medication side effect;Impaired mobility   PHQ 2/9 Scores 09/29/2017  PHQ - 2 Score 1    Assessment:    CM met with Destiny Boyd, Destiny Boyd and Destiny Boyd's fiance at [employer] home. Destiny Boyd was sitting in her chair by the window.  CM noted swelling of her face but she and Destiny Boyd denied swelling. She reported being tired from dialysis and noted to doze off at intervals  Follow up with Primary  care provider, Dr Destiny Boyd on 09/28/17 Destiny Boyd denied a discussion about palliative care took place but informed CM that Destiny Boyd was going to a "rehab for therapy" as decided at Dr Destiny Boyd follow up visit.  She did not inform Cm about the name of the facility nor when Destiny Minella would be going to a facility and still voiced interest in CM completing nutrition teaching today as requested on the last home visit. CM reviewed palliative care (home, facility), hospice (home or facility) and skilled nursing facility services. Discussed after Destiny Boyd stated "why don't you tell her" The conversation with Kreg Shropshire and Destiny Boyd after her 09/19/17 discharge related ESRD and Calciphylaxis.prognosis was discussed as told to this CM. Discussed calciphylaxis as hardening vessels not allowing circulation leading to other complications.  Discussed available palliative care services to assist with this and goals of care.  Discussed the availability of 2-3 weeks, 2-3 months and traditional skilled nursing/rehab facility stays.  Destiny Jazzmen, [place] and Destiny Boyd fiance voiced understanding.  Destiny Boyd stated "we want her to go to rehab to become stronger.  I told momma we got lots of places and things to do together."  Support Destiny Boyd did mention that she has the support of her brother and fiance.  Reports Destiny Boyd's son lives in another state at this time.  Nutrition teaching CM reviewed Destiny Hester ordered renal diet, low sodium, no or low sugar foods reviewed.  A flyer on low sodium foods reviewed in detail. CM reviewed how to read food labels for sodium, sugar, carbohydrates and calories.  Suggested starting with 1250 mg sodium /day, less sugar at  1-9 gram sugar for each serving if available.  CM went through kitchen cabinets, refrigerator and deep refrigerator to discuss recommended items, alternative food items and non recommended food items with Destiny Boyd and her fiance while Destiny Douglass dozed in her chair. Cm discussed portion size (  palm of hand) Discussed inquiring about her fluid restriction (generally 2 L) Discussed the plate method of eating, counting carbs, starchy vegetables, Reviewed and provided a handout of starchy vegetables. Cm discussed fish, chicken and [place].  Cm informed that Destiny Boyd would be preparing salmon cakes for dinner with broccoli and cheese and for the next breakfast egg scrambled with wheat toast.  While awake she did report that she   liked grits, oatmeal, grapes and peanut butter & crackers   DM- Destiny Boyd confirms she has all the information and a Rx at the pharmacy to get a glucometer but has not obtained one yet  DME A Wheel chair has not been obtained and CM noted upon leaving Destiny Boyd asked her fiance to assist her with getting Destiny Goodchild to the car after she finished checking her out first.  Destiny Villagomez was noted to be assisted to her Rolator and taken to her room. [facility] delivered incontinence pads to the home during this home visit paid for by [facility]  Supplements- During the home visit [facility] delivered ensure supplements that Destiny Boyd reports is paid for by [facility]. She called Waymond Cera during the home visit to confirm the $80+ listed on the sheet provided from [place] is not a bill that Destiny Boyd would have to pay or that was charged to the patient.  CM reviewed with them the sugar, carbohydrate and sodium contents on the food labels for Ensure, Glucerna and ensure enlive. gave print out of starchy vegetables   Plan: follow up with Destiny Inlow in 1-2 weeks to further review medical diagnoses and further transition of care services Route note to Listed MD on care team  [facility] CM Care Plan Problem One     Most Recent Value  Care Plan Problem One  Risk for hospital readmission secondary to [place] of CKD, PAD, DM  Role Documenting the Problem One  Care Management Coordinator  Care Plan for Problem One  Active  [facility] Long Term Goal   over the next 31 days patient will  not experience hospital re admission as evidence by patient reporting and review of EMR during Cm outreach  [place] Term Goal Start Date  09/22/17  Interventions for Problem One Long Term Goal   Discussed with patient current clinical status, upcoming provider appointments,  medication reconciliation completed.  [facility] CCM TOC program initiated  [facility] CM Short Term Goal #1   Over the next 7 days, patient will schedule hospital follow up office visit appointments with PCP, as evidenced by patient reporting during [facility] RN CCM outreach  [facility] CM Short Term Goal #1 Start Date  09/22/17  [facility] CM Short Term Goal #1 Met Date  09/28/17  Interventions for Short Term Goal #1  Discussed with patient his plans to schedule hospital follow up appointments with cardiologist and PCP,  encouraged  patient to schedule appointments promptly    [facility] CM Care Plan Problem Two     Most Recent Value  Care Plan Problem Two  palliative/hospice goals of care  Role Documenting the Problem Two  Care Management [place] for Problem Two  Active  Interventions for Problem Two Long Term Goal   reviewed palliative care (home, facility), hospice (home or facility) and skilled nursing facility services.related ESRD and Calciphylaxis.prognosis Discussed calciphylaxis  Discussed the availability of 2-3 weeks, 2-3 months and traditional skilled nursing/rehab facility stays  [facility] Long Term Goal  over the next 31 days patient and family will voice understanding of palliative, hospice and facility care differences and   [facility] Long Term Goal Start Date  09/29/17  [facility] CM Short Term Goal #1   over the next 14 days patient and family will receive preferred services for continued care  [facility] CM Short Term Goal #1 Start Date  09/29/17  Interventions for Short Term Goal #2   reviewed palliative care (home, facility), hospice (home or facility) and skilled nursing facility services.related ESRD and Calciphylaxis.prognosis Discussed calciphylaxis   Discussed the availability of 2-3 weeks, 2-3 months and traditional skilled nursing/rehab facility stays.    [facility] CM Care Plan Problem Three     Most Recent Value  Care Plan Problem Three  knowledge deficit of renal, low sodium DM diet  Role Documenting the Problem Three  Care Management Coordinator  Care Plan for Problem Three  Active  [facility] Long Term Goal   over the next 31 days patient and family will verbalize understanding of renal low sodium DM diet   [facility] Long Term Goal Start Date  09/29/17  Interventions for Problem Three Long Term Goal  over the next 31 days patient and family will verbalize understanding of renal low sodium DM diet   [facility] CM Short Term Goal #1   over the next 14 days patient and family will confirm decrease in sodium in diet by verbalizing meal choices  [facility] CM Short Term Goal #1 Start Date  09/29/17  Interventions for Short Term Goal #1  Renal diet, low sodium, no or low sugar foods reviewed.  A flyer on low sodium foods reviewed in detail. CM reviewed how to read food labels for sodium, sugar, carbohydrates and calories.  Suggested starting with 1250 mg sodium /day, less sugar at  1-9 gram sugar for each serving if available.  CM went through kitchen cabinets, refrigerator and deep refrigerator to discuss recommended items, alternative food items and non recommended food items with Destiny Boyd and her fiance while Destiny Burress dozed in her chair. Cm discussed portion size ( palm of hand) Discussed inquiring about her fluid restriction (generally 2 L) Discussed the plate method of eating, counting carbs, starchy vegetables, Reviewed and provided a handout of starchy vegetables       Radley Teston L. Lavina Hamman, RN, BSN, [place] Coordinator [phone] week day mobile

## 2017-10-16 NOTE — Patient Outreach (Signed)
[facility]) Care Management  10-09-2017  Destiny Boyd [birth year] [id number]   Late entry for 09/30/17   Tonia Ghent, Dr Josephine Cables nurse contacted to discuss another patient but was informed that [name] had decided to get assist from the office to get Destiny Boyd to a rehab facility.  Discussed [name] did not realize the care that would be needed for the patient. Ida assisted with providing a FL2.  Tonia Ghent stated [name] reported being interested in "[facility]" Cm confirmed Destiny Boyd was seen on 09/29/17 last near 2 pm but [name] did not mention that she was trying to get patient to [facility] but stated she wanted short term rehab and did not know a date or time for a facility admission when CM asked, Cm confirmed medicaid had not been obtained at the time.   Takiesha Mcdevitt L. Lavina Hamman, RN, BSN, [place] Coordinator [phone] week day mobile

## 2017-10-16 NOTE — ED Notes (Signed)
Spoke with [facility] dialysis nurse who states pt had a progressive decline in mental status during treatment today but was talking and was able to transfer self from chair for weight.  States she did not think anything of the mental status change as this has happened since pt's last admission.

## 2017-10-16 NOTE — ED Notes (Signed)
MD Lacinda Axon notified pt began bleeding from fistula site and had frank hematuria.

## 2017-10-16 NOTE — ED Notes (Signed)
Pt urinated while changing brief, not able to obtain specimen.

## 2017-10-16 NOTE — ED Notes (Signed)
Date and time results received: 10-10-17 1355   Test: INR/Prothrombin Time  Critical Value: INR 4.48  Name of Provider Notified: Lacinda Axon  Orders Received? Or Actions Taken?: no new orders at this time.

## 2017-10-16 NOTE — Progress Notes (Signed)
Code stroke 6440 call time 1244 exam started 1246 exam finished 3474 images to [facility] 1253 exam completed in epic,[facility] radiology called spoke with Opal Sidles

## 2017-10-16 NOTE — Progress Notes (Signed)
   [facility] TeleNeurology Consult Services  Impression: Patient with left SDH and left to right shift, unclear if underlying trauma/fall.   Not a tpa candidate due to: extensive and diffuse SDH with 6mm shift    Comments:   [facility] contacted: 12:51 [facility] at bedside: 12:58 NIHSS assessment time: 13:08  Recommendations: HOB elevated at 30 degrees Neurosurgery evaluation BP control goal <478 systolic No antiplatelet or antithrombotic agents  MRI brain  inpatient neurology consultation Inpatient stroke evaluation as per Neurology/ Internal Medicine Discussed with ED MD  -----------------------------------------------------------------------------------------  CC  History of Present Illness   Patient is a  84 yr old with PMH Of colon ca, ESRD on HD, HTN, HLD, DM, chronic leg wounds with necrosis has declined amputation who presents from dialysis after being noticeably weak on the right. The family last saw her well at 5am this morning prior to going to HD. There she was less communicative and more lethargic. Right side was not moving. Per records it does appear the family wanted to make her palliative care. She does not ambulate at baseline nor move her legs.  No seizure like activity reported.   Diagnostic: CTH: large subdural bleed, left frontal parietal with left to right shift 9 mm also interhemispheric. No hydrocephalus  Exam:  NIHSS score: 34 1a 3, 1b 2, 1c 2, 2-2, 3-2, 4-2, 5a 1, 5b 4, 6a/b 8, 7-2, 8-2 9 2, 10-2, 11-2     Medical Decision Making:  - Extensive number of diagnosis or management options are considered above.   - Extensive amount of complex data reviewed.   - High risk of complication and/or morbidity or mortality are associated with differential diagnostic considerations above.  - There may be Uncertain outcome and increased probability of prolonged functional impairment or high probability of severe prolonged functional impairment  associated with some of these differential diagnosis.  Medical Data Reviewed:  1.Data reviewed include clinical labs, radiology,  Medical Tests;   2.Tests results discussed w/performing or interpreting physician;   3.Obtaining/reviewing old medical records;  4.Obtaining case history from another source;  5.Independent review of image, tracing or specimen.    Patient was informed the Neurology Consult would happen via telehealth (remote video) and consented to receiving care in this manner.

## 2017-10-16 NOTE — Discharge Summary (Signed)
Death Summary  LACYE MCCARN GYJ:[id number] DOB: 1933-10-24 DOA: 2017/10/31  PCP: Rosita Fire, MD   Admit date: 10-31-2017 Date of Death: 2017/10/31  Final Diagnoses:  Active Problems:   Subdural hemorrhage (HCC)   ESRD     History of present illness:  84 y.o. female, with history of diabetes mellitus, end-stage renal disease on hemodialysis, Jerrye Bushy, hypertension, paroxysmal atrial fibrillation on Coumadin for anticoagulation, peripheral vascular disease was brought to hospital with altered mental status.. Patient underwent hemodialysis today and when she returned from dialysis at 10 AM patient was found to be disoriented. EMS was called and patient was transported to the ED.  As per family members, patient fell at home yesterday. She is on Coumadin. INR is 4.48.  In the ED CT of the head showed subdural hemorrhage 9 mm midline shift to the right on left. Neurosurgeon was consulted by the ED physician, and he recommended comfort measures only. Patient is not a surgical candidate.     Hospital Course: I discussed in detail with patient's family members regarding her poor prognosis and recommendation by neurosurgeon. At this time family members are in agreement with comfort measures only. Also confirm that patient is DNR. Patient will be admitted for end-of-life care    patient was admitted for comfort measures only. She expired in the ED at 1650     Signed:  Oswald Hillock  [facility] 31-Oct-2017, 5:51 PM

## 2017-10-16 NOTE — H&P (Signed)
TRH H&P    Patient Demographics:    Destiny Boyd, is a 84 y.o. female  MRN: [id number]  DOB - [birth year]  Admit Date - 2017/10/09  Referring MD/NP/PA: Dr. Lacinda Axon  Outpatient Primary MD for the patient is Rosita Fire, MD  Patient coming from: home  Chief complaint-altered mental status   HPI:    Destiny Boyd  is a 84 y.o. female, with history of diabetes mellitus, end-stage renal disease on hemodialysis, Jerrye Bushy, hypertension, paroxysmal atrial fibrillation on Coumadin for anticoagulation, peripheral vascular disease was brought to hospital with altered mental status.. Patient underwent hemodialysis today and when she returned from dialysis at 10 AM patient was found to be disoriented. EMS was called and patient was transported to the ED.  As per family members, patient fell at home yesterday. She is on Coumadin. INR is 4.48.  In the ED CT of the head showed subdural hemorrhage 9 mm midline shift to the right on left. Neurosurgeon was consulted by the ED physician, and he recommended comfort measures only. Patient is not a surgical candidate.    Review of systems:    unobtainable due to  Patient's  altered mental status   With Past History of the following :    Past Medical History:  Diagnosis Date  . 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 Colon cancer ([place])    cecum cancer  . Diabetes mellitus   . ESRD (end stage renal disease) ([place])   . Full dentures   . GERD (gastroesophageal reflux disease)   . Gout   . Hypercholesteremia   . Hypertension   . Iron deficiency anemia   . Leukopenia   . PAF (paroxysmal atrial fibrillation) (HCC)    a. on Coumadin for anticoagulation  . Peripheral vascular disease ([place])   . Stroke ([place]) 2005  . Wears glasses       Past Surgical History:  Procedure Laterality Date  . ABDOMINAL AORTOGRAM W/LOWER EXTREMITY N/A 08/27/2017   Procedure: ABDOMINAL AORTOGRAM  W/LOWER EXTREMITY;  Surgeon: Conrad Hyndman, MD;  Location: [place] [facility];  Service: Cardiovascular;  Laterality: N/A;  . ABDOMINAL HYSTERECTOMY    . AV FISTULA PLACEMENT Left 08/24/2013   Procedure: ARTERIOVENOUS (AV) FISTULA CREATION- LEFT BRACHIAL CEPHALIC;  Surgeon: Conrad Wolfforth, MD;  Location: [place];  Service: Vascular;  Laterality: Left;  . BIOPSY OF SKIN SUBCUTANEOUS TISSUE AND/OR MUCOUS MEMBRANE Left 08/31/2017   Procedure: BIOPSY OF SKIN SUBCUTANEOUS TISSUE  LEFT CALF;  Surgeon: Conrad Fox Chase, MD;  Location: [place];  Service: Vascular;  Laterality: Left;  . CATARACT EXTRACTION, BILATERAL     lens implants  . COLON SURGERY  2010  . COLONOSCOPY  2008   Dr. Oneida Alar: 3-4 cm cecal polyp partially removed, path noting cecal adenocarcinoma, underwent right hemicolectomy with TI resection  . COLONOSCOPY  2009   Dr. Oneida Alar: 61mm sessile rectal polyp (benign), rare diverticula  . ESOPHAGOGASTRODUODENOSCOPY  2008   Dr. Oneida Alar: normal esophagus  . ESOPHAGOGASTRODUODENOSCOPY  2009   Dr. Oneida Alar: multiple antral erosions (reactive gastropathy), normal  duodenum, normal esophagus  . EYE SURGERY    . GIVENS CAPSULE STUDY  2009   normal   . HEMICOLECTOMY  2008  . ILIAC ATHERECTOMY Left 08/31/2017   Procedure: ORBITAL ATHERECTOMY of Left Superficial Femoral  and Peroneal  Artery. Drug Coated ANGIOPLASTY of Left Superficial Femoral Artery. Angioplasty of Peroneal Artery;  Surgeon: Conrad South Hooksett, MD;  Location: [place];  Service: Vascular;  Laterality: Left;  . LOWER EXTREMITY ANGIOGRAM  08/31/2017  . LOWER EXTREMITY ANGIOGRAM Left 08/31/2017   Procedure: LOWER EXTREMITY ANGIOGRAM LEFT LEG RUNOFF;  Surgeon: Conrad Lake Tansi, MD;  Location: [place];  Service: Vascular;  Laterality: Left;  Marland Kitchen MULTIPLE TOOTH EXTRACTIONS    . PERIPHERAL VASCULAR CATHETERIZATION N/A 03/03/2016   Procedure: Fistulagram;  Surgeon: Angelia Mould, MD;  Location: [place] [facility];  Service: Cardiovascular;  Laterality: N/A;  .  PERIPHERAL VASCULAR CATHETERIZATION Left 03/03/2016   Procedure: Peripheral Vascular Balloon Angioplasty;  Surgeon: Angelia Mould, MD;  Location: [place] [facility];  Service: Cardiovascular;  Laterality: Left;  arm fistula pta      Social History:      Social History   Tobacco Use  . Smoking status: Former Smoker    Packs/day: 1.00    Years: 10.00    Pack years: 10.00    Types: Cigarettes    Last attempt to quit: 04/12/1985    Years since quitting: 32.4  . Smokeless tobacco: Never Used  Substance Use Topics  . Alcohol use: No    Alcohol/week: 0.0 oz       Family History :     Family History  Problem Relation Age of Onset  . Diabetes Mother   . Hypertension Mother   . Hyperlipidemia Daughter   . Varicose Veins Daughter   . Cancer Son   . Diabetes Son   . Heart disease Son   . Hyperlipidemia Son       Home Medications:   Prior to Admission medications   Medication Sig Start Date End Date Taking? Authorizing Provider  amLODipine (NORVASC) 10 MG tablet Take 1 tablet by mouth daily. 02/12/15  Yes [provider]  aspirin EC 81 MG tablet Take 81 mg by mouth daily.   Yes [provider]  diltiazem (CARDIZEM CD) 120 MG 24 hr capsule Take 240 mg ( 2 tablets ) in th am, and take 120 mg ( 1 tablet)  in the pm Patient taking differently: Take 120-240 mg by mouth See admin instructions. Take 240 mg ( 2 tablets ) in th am, and take 120 mg ( 1 tablet)  in the pm 06/25/17  Yes Herminio Commons, MD  metoprolol tartrate (LOPRESSOR) 50 MG tablet Take 1 tablet (50 mg total) by mouth 2 (two) times daily. 09/19/17 09/19/18 Yes Dhungel, Nishant, MD  multivitamin (RENA-VIT) TABS tablet Take 1 tablet by mouth daily.   Yes [provider]  pantoprazole (PROTONIX) 40 MG tablet Take 1 tablet (40 mg total) by mouth daily. 01/30/17  Yes Rosita Fire, MD  sevelamer carbonate (RENVELA) 800 MG tablet Take 3,200 mg by mouth 3 (three) times daily with meals. & 1600  by mouth with snacks   Yes [provider]  TRADJENTA 5 MG TABS tablet Take 1 tablet by mouth daily. 02/06/17  Yes [provider]  traMADol (ULTRAM) 50 MG tablet Take 100 mg by mouth at bedtime.    Yes [provider]  warfarin (COUMADIN) 2.5 MG tablet Take 2.5-5 mg by  mouth. Per 09/16/17 warfarin note in chart:  2.5 mg Monday, Wednesday and Friday, and 5 mg Tuesday, Thursday, Saturday and sunday 09/03/17  Yes Dagoberto Ligas, PA-C     Allergies:    No Known Allergies   Physical Exam:   Vitals  Blood pressure (!) 176/72, pulse 97, temperature 98.1 F (36.7 C), temperature source Oral, resp. rate 18, height 5\' 3"  (1.6 m), weight 50.8 kg (112 lb), SpO2 99 %.  1.  General: Patient is obtunded  2. Psychiatric: not tested  3. Neurologic: obtunded, moving all extremities  4. Eyes : moist conjunctivae   5. ENMT:  Oropharynx clear with moist mucous membranes   6. Neck:  supple, no cervical lymphadenopathy appriciated  7. Respiratory : Normal respiratory effort, good air movement bilaterally,clear to  auscultation bilaterally  8. Cardiovascular : RRR, no gallops, rubs or murmurs, no leg edema  9. Gastrointestinal:  Positive bowel sounds, abdomen soft, non-tender to palpation,no hepatosplenomegaly, no rigidity or guarding            Data Review:    CBC Recent Labs  Lab 2017-10-21 1308 Oct 21, 2017 1309  WBC  --  18.9*  HGB 9.5* 8.1*  HCT 28.0* 26.8*  PLT  --  384  MCV  --  84.3  MCH  --  25.5*  MCHC  --  30.2  RDW  --  15.7*  LYMPHSABS  --  1.7  MONOABS  --  1.1*  EOSABS  --  0.0  BASOSABS  --  0.0   ------------------------------------------------------------------------------------------------------------------  Chemistries  Recent Labs  Lab 21-Oct-2017 1308 10/21/2017 1309  NA 134* 136  K 5.4* 3.6  CL 99* 94*  CO2  --  25  GLUCOSE 250* 249*  BUN 17 15  CREATININE 3.40* 3.49*  CALCIUM  --  9.0  AST  --  20  ALT  --  19    ALKPHOS  --  100  BILITOT  --  0.6   ------------------------------------------------------------------------------------------------------------------  ------------------------------------------------------------------------------------------------------------------ GFR: Estimated Creatinine Clearance: 10.1 mL/min (A) (by C-G formula based on SCr of 3.49 mg/dL (H)). Liver Function Tests: Recent Labs  Lab 21-Oct-2017 1309  AST 20  ALT 19  ALKPHOS 100  BILITOT 0.6  PROT 7.8  ALBUMIN 2.3*   No results for input(s): LIPASE, AMYLASE in the last 168 hours. No results for input(s): AMMONIA in the last 168 hours. Coagulation Profile: Recent Labs  Lab 09/30/17 10-21-17 1309  INR 4.7 4.48*   Cardiac Enzymes: No results for input(s): CKTOTAL, CKMB, CKMBINDEX, TROPONINI in the last 168 hours. BNP (last 3 results) No results for input(s): PROBNP in the last 8760 hours. HbA1C: No results for input(s): HGBA1C in the last 72 hours. CBG: Recent Labs  Lab 2017-10-21 1300  GLUCAP 237*    --------------------------------------------------------------------------------------------------------------- Urine analysis:    Component Value Date/Time   COLORURINE YELLOW 11/22/2008 2014   APPEARANCEUR CLEAR 11/22/2008 2014   LABSPEC 1.020 11/22/2008 2014   PHURINE 5.5 11/22/2008 2014   GLUCOSEU NEG mg/dL 11/22/2008 2014   HGBUR TRACE (A) 07/07/2007 0956   BILIRUBINUR NEG 11/22/2008 2014   KETONESUR NEG mg/dL 11/22/2008 2014   PROTEINUR 30 (A) 11/22/2008 2014   UROBILINOGEN 0.2 11/22/2008 2014   NITRITE NEG 11/22/2008 2014   LEUKOCYTESUR NEG 11/22/2008 2014      Imaging Results:    Ct Head Code Stroke Wo Contrast  Result Date: Oct 21, 2017 CLINICAL DATA:  Code stroke. Focal neuro deficit. Right-sided weakness. Coumadin. EXAM: CT HEAD WITHOUT CONTRAST TECHNIQUE: Contiguous  axial images were obtained from the base of the skull through the vertex without intravenous contrast. COMPARISON:   None. FINDINGS: Brain: Extensive acute subdural hemorrhage in multiple areas. The largest area of hemorrhage is in the left frontal parietal region measuring up to 19 mm in thickness. There is also interhemispheric subdural blood of a moderate degree. Tentorial subdural hemorrhage is present bilaterally as well as subdural blood surrounding the right cerebellar hemisphere. 9 mm midline shift to the right. Effacement of the sulci throughout the hemisphere on the left. Generalized atrophy. Negative for hydrocephalus. Negative for acute ischemic infarction.  Negative for mass. Vascular: Negative for hyperdense vessel Skull: Negative Sinuses/Orbits: Prior sinus surgery. Sinuses are clear. Bilateral cataract removal. Other: None IMPRESSION: 1. Extensive and diffuse subdural hemorrhage most prominent on the left. 9 mm midline shift to the right 2. These results were called by telephone at the time of interpretation on October 22, 2017 at 1:07 pm to Dr. Nat Christen , who verbally acknowledged these results. 3. Electronically Signed   By: Franchot Gallo M.D.   On: 10-22-2017 13:08    My personal review of EKG: Rhythm sinus tachycardia    Assessment & Plan:    Active Problems:   Subdural hemorrhage (HCC)   1. Subdural hemorrhage-patient has extensive and diffuse cerebral hemorrhage most prominent on left. Causing midline shift right. Neurosurgeon was consulted, patient is not a surgical candidate. Recommended comfort measures only.   2. Goals of care discussion-I discussed in detail with patient's family members regarding her poor prognosis and recommendation by neurosurgeon. At this time family members are in agreement with comfort measures only. Also confirm that patient is DNR. Patient will be admitted for end-of-life care, if she survives the next 24 to 48 hours, consider residential hospice placement. Start fentanyl 50 g Q2 hours PRN  for pain, shortness of breath. Ativan 2 mg IV Q4 hours PRN for  seizure/sedation/anxiety. Will also consult palliative care for symptom management.    DVT Prophylaxis-   SCDs   AM Labs Ordered, also please review Full Orders  Goals of care-discussed in detail with patient's family at bedside regarding poor prognosis. At this time goal is total comfort measures, and if patient remains stable over next 24 to 48 hours, consider transfer to residential hospice for end-of-life care.  Code Status: DNR  Admission status: inpatient  Time spent in minutes : 60 minutes  More than 50% time spent in discussing goals of care   Oswald Hillock M.D on 2017-10-22 at 3:55 PM  Between 7am to 7pm - Pager - [phone]. After 7pm go to [email or web address] - password [facility]  [facility] - Office  [phone]

## 2017-10-16 NOTE — Patient Outreach (Addendum)
[facility]) Care Management . 10/23/2017  Destiny Boyd 12-19-1933 [id number]  Care coordination  [id number] 23-Oct-2017 Call from Kreg Shropshire, [facility] ESRD Clinical Wellness Coach to discuss Destiny Boyd, noted she has returned to [facility] from [place] dialysis today with AMS and voiced concern about re admission, palliative care, pain management and progression   Updated Kreg Shropshire about the discussion CM had with the patient, daughter, [name] and daughter's fiance on 09/29/17 about calciphylaxis, palliative care, hospice, goals of care after finding out that either it was not discussed nor understood at the recent office visit to Dr Josephine Cables office. Especially after CM was informed that the daughter decided that Destiny Boyd would benefit from a short term rehab program.  This was confirmed by Tonia Ghent, Dr Josephine Cables RN on 09/30/17.  Cm discussed this was also reviewed during [facility] difficult case discussion on 10/23/17 with recommendations to continue services to monitor progressive declining condition of LE. Education about pain management and appropriate level of care planning.    1615 CM continued to monitor ED visit in Epic to find that patient has been admitted for comfort care after daughter states patient had a fall on 09/30/17 and imaging indicates a Subdural hematoma (SDH) Destiny Boyd is determined after neurology consult not to be a surgical candidate  Plans: CM will continue to monitor patient hospital stay and notified [place] Liaison of her re admission and status via in basket message Updated Kreg Shropshire via e-mail CM was scheduled for home visit with Destiny Boyd in 2 weeks pending hospital discharge    Route note to MD care team members listed in Epic  Spoke to [place] at Dr Legrand Rams office to update her on re admission, SDH, comfort care measures. Tonia Ghent confirmed [name] was assisted with a FL2 to take to [facility] facility during this week after 09/28/17 pcp visit.     Barnie Sopko L.  Lavina Hamman, RN, BSN, [place] Coordinator [phone] week day mobile

## 2017-10-16 NOTE — ED Notes (Signed)
No radial pulse at this time, asystole on the monitor. MD [place] paged.

## 2017-10-16 NOTE — ED Notes (Signed)
NO TPA decision at this time as pt has hemorrhage.

## 2017-10-16 NOTE — ED Notes (Signed)
Pt having bleeding from left sided fistula.  Pressure dressing applied and Dr. Lacinda Axon made aware.

## 2017-10-16 NOTE — ED Provider Notes (Signed)
[facility] EMERGENCY DEPARTMENT Provider Note   CSN: [id number] Arrival date & time: 10/29/17  1244   An emergency department physician performed an initial assessment on this suspected stroke patient at 1248.  History   Chief Complaint Chief Complaint  Patient presents with  . Altered Mental Status    HPI Destiny Boyd is a 84 y.o. female.  Level 5 caveat for acuity of condition and urgent need for intervention.  Most of history obtained from EMS and daughter.  Daughter reports patient leaving the house at 4:45 AM today for dialysis.  She was normal at this time.  When she returned from dialysis at 10 AM, the daughter noted that she was acting in a disoriented fashion.  The right side appeared to be flaccid and she was not speaking normally.  EMS was called and the patient was transported to the ED.  Patient has multiple medical problems including end-stage renal disease, colon cancer, diabetes, hypertension, previous stroke, many others.      Past Medical History:  Diagnosis Date  . 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 Colon cancer ([place])    cecum cancer  . Diabetes mellitus   . ESRD (end stage renal disease) ([place])   . Full dentures   . GERD (gastroesophageal reflux disease)   . Gout   . Hypercholesteremia   . Hypertension   . Iron deficiency anemia   . Leukopenia   . PAF (paroxysmal atrial fibrillation) (H[facility])    a. on Coumadin for anticoagulation  . Peripheral vascular disease ([place])   . Stroke ([place]) 2005  . Wears glasses     Patient Active Problem List   Diagnosis Date Noted  . Leucocytosis 09/19/2017  . Calciphylaxis   . Atrial fibrillation with RVR ([place]) 09/16/2017  . Pressure injury of skin 09/16/2017  . Abnormal liver function   . PAD (peripheral artery disease) ([place]) 08/31/2017  . Atherosclerosis of native arteries of the extremities with ulceration ([place]) 08/21/2017  . 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 Calciphylaxis of right lower extremity with nonhealing ulcer, limited to breakdown of skin ([place]) 08/21/2017  . Long term (current) use of anticoagulants [Z79.01] 03/09/2017  . Nausea and vomiting   . Anemia   . Demand ischemia of myocardium ([place])   . Encounter for anticoagulation discussion and counseling   . Elevated troponin 01/24/2017  . DM type 2 (diabetes mellitus, type 2) ([place]) 01/24/2017  . Aortic atherosclerosis ([place]) 01/24/2017  . ESRD on dialysis ([place]) 06/30/2013  . Anemia in chronic kidney disease(285.21) 05/05/2013  . Iron deficiency 05/20/2012  . HEMORRHOIDS 11/22/2008  . HEMATURIA UNSPECIFIED 11/22/2008  . 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 Essential hypertension 10/30/2008  . TOBACCO USE, QUIT 10/30/2008    Past Surgical History:  Procedure Laterality Date  . ABDOMINAL AORTOGRAM W/LOWER EXTREMITY N/A 08/27/2017   Procedure: ABDOMINAL AORTOGRAM W/LOWER EXTREMITY;  Surgeon: Conrad Alta, MD;  Location: [place] [facility];  Service: Cardiovascular;  Laterality: N/A;  . ABDOMINAL HYSTERECTOMY    . AV FISTULA PLACEMENT Left 08/24/2013   Procedure: ARTERIOVENOUS (AV) FISTULA CREATION- LEFT BRACHIAL CEPHALIC;  Surgeon: Conrad Zortman, MD;  Location: [place];  Service: Vascular;  Laterality: Left;  . BIOPSY OF SKIN SUBCUTANEOUS TISSUE AND/OR MUCOUS MEMBRANE Left 08/31/2017   Procedure: BIOPSY OF SKIN SUBCUTANEOUS TISSUE  LEFT CALF;  Surgeon: Conrad , MD;  Location: [place];  Service: Vascular;  Laterality: Left;  .  CATARACT EXTRACTION, BILATERAL     lens implants  . COLON SURGERY  2010  . COLONOSCOPY  2008   Dr. Oneida Alar: 3-4 cm cecal polyp partially removed, path noting cecal adenocarcinoma, underwent right hemicolectomy with TI resection  . COLONOSCOPY  2009   Dr. Oneida Alar: 58mm sessile rectal polyp (benign), rare diverticula  . ESOPHAGOGASTRODUODENOSCOPY  2008   Dr. Oneida Alar: normal esophagus  . ESOPHAGOGASTRODUODENOSCOPY  2009   Dr. Oneida Alar:  multiple antral erosions (reactive gastropathy), normal duodenum, normal esophagus  . EYE SURGERY    . GIVENS CAPSULE STUDY  2009   normal   . HEMICOLECTOMY  2008  . ILIAC ATHERECTOMY Left 08/31/2017   Procedure: ORBITAL ATHERECTOMY of Left Superficial Femoral  and Peroneal  Artery. Drug Coated ANGIOPLASTY of Left Superficial Femoral Artery. Angioplasty of Peroneal Artery;  Surgeon: Conrad Etna, MD;  Location: [place];  Service: Vascular;  Laterality: Left;  . LOWER EXTREMITY ANGIOGRAM  08/31/2017  . LOWER EXTREMITY ANGIOGRAM Left 08/31/2017   Procedure: LOWER EXTREMITY ANGIOGRAM LEFT LEG RUNOFF;  Surgeon: Conrad [place], MD;  Location: [place];  Service: Vascular;  Laterality: Left;  Marland Kitchen MULTIPLE TOOTH EXTRACTIONS    . PERIPHERAL VASCULAR CATHETERIZATION N/A 03/03/2016   Procedure: Fistulagram;  Surgeon: Angelia Mould, MD;  Location: [place] [facility];  Service: Cardiovascular;  Laterality: N/A;  . PERIPHERAL VASCULAR CATHETERIZATION Left 03/03/2016   Procedure: Peripheral Vascular Balloon Angioplasty;  Surgeon: Angelia Mould, MD;  Location: [place] [facility];  Service: Cardiovascular;  Laterality: Left;  arm fistula pta    OB History    No data available       Home Medications    Prior to Admission medications   Medication Sig Start Date End Date Taking? Authorizing Provider  amLODipine (NORVASC) 10 MG tablet Take 1 tablet by mouth daily. 02/12/15  Yes [provider]  aspirin EC 81 MG tablet Take 81 mg by mouth daily.   Yes [provider]  diltiazem (CARDIZEM CD) 120 MG 24 hr capsule Take 240 mg ( 2 tablets ) in th am, and take 120 mg ( 1 tablet)  in the pm Patient taking differently: Take 120-240 mg by mouth See admin instructions. Take 240 mg ( 2 tablets ) in th am, and take 120 mg ( 1 tablet)  in the pm 06/25/17  Yes Herminio Commons, MD  metoprolol tartrate (LOPRESSOR) 50 MG tablet Take 1 tablet (50 mg total) by mouth 2 (two) times daily. 09/19/17  09/19/18 Yes Dhungel, Nishant, MD  multivitamin (RENA-VIT) TABS tablet Take 1 tablet by mouth daily.   Yes [provider]  pantoprazole (PROTONIX) 40 MG tablet Take 1 tablet (40 mg total) by mouth daily. 01/30/17  Yes Rosita Fire, MD  sevelamer carbonate (RENVELA) 800 MG tablet Take 3,200 mg by mouth 3 (three) times daily with meals. & 1600 by mouth with snacks   Yes [provider]  TRADJENTA 5 MG TABS tablet Take 1 tablet by mouth daily. 02/06/17  Yes [provider]  traMADol (ULTRAM) 50 MG tablet Take 100 mg by mouth at bedtime.    Yes [provider]  warfarin (COUMADIN) 2.5 MG tablet Take 2.5-5 mg by mouth. Per 09/16/17 warfarin note in chart:  2.5 mg Monday, Wednesday and Friday, and 5 mg Tuesday, Thursday, Saturday and sunday 09/03/17  Yes Dagoberto Ligas, PA-C    Family History Family History  Problem Relation Age of Onset  . Diabetes Mother   . Hypertension  Mother   . Hyperlipidemia Daughter   . Varicose Veins Daughter   . Cancer Son   . Diabetes Son   . Heart disease Son   . Hyperlipidemia Son     Social History Social History   Tobacco Use  . Smoking status: Former Smoker    Packs/day: 1.00    Years: 10.00    Pack years: 10.00    Types: Cigarettes    Last attempt to quit: 04/12/1985    Years since quitting: 32.4  . Smokeless tobacco: Never Used  Substance Use Topics  . Alcohol use: No    Alcohol/week: 0.0 oz  . Drug use: No     Allergies   Patient has no known allergies.   Review of Systems Review of Systems  Unable to perform ROS: Acuity of condition     Physical Exam Updated Vital Signs BP (!) 178/74   Pulse (!) 103   Temp 98.1 F (36.7 C) (Oral)   Resp (!) 22   Ht 5\' 3"  (1.6 m)   Wt 50.8 kg (112 lb)   SpO2 99%   BMI 19.84 kg/m   Physical Exam  Constitutional:  Does not respond to verbal stimuli.  HENT:  Head: Normocephalic and atraumatic.  Eyes: Conjunctivae are normal.  Neck: Neck supple.    Cardiovascular: Normal rate and regular rhythm.  Pulmonary/Chest: Effort normal and breath sounds normal.  Abdominal: Soft. Bowel sounds are normal.  Musculoskeletal:  unable  Neurological:  Obtunded  Skin: Skin is warm and dry.  Psychiatric:  unable  Nursing note and vitals reviewed.    ED Treatments / Results  Labs (all labs ordered are listed, but only abnormal results are displayed) Labs Reviewed  PROTIME-INR - Abnormal; Notable for the following components:      Result Value   Prothrombin Time 42.3 (*)    INR 4.48 (*)    All other components within normal limits  APTT - Abnormal; Notable for the following components:   aPTT 91 (*)    All other components within normal limits  CBC - Abnormal; Notable for the following components:   WBC 18.9 (*)    RBC 3.18 (*)    Hemoglobin 8.1 (*)    HCT 26.8 (*)    MCH 25.5 (*)    RDW 15.7 (*)    All other components within normal limits  DIFFERENTIAL - Abnormal; Notable for the following components:   Neutro Abs 16.1 (*)    Monocytes Absolute 1.1 (*)    All other components within normal limits  COMPREHENSIVE METABOLIC PANEL - Abnormal; Notable for the following components:   Chloride 94 (*)    Glucose, Bld 249 (*)    Creatinine, Ser 3.49 (*)    Albumin 2.3 (*)    GFR calc non Af Amer 11 (*)    GFR calc Af Amer 13 (*)    Anion gap 17 (*)    All other components within normal limits  I-STAT CHEM 8, ED - Abnormal; Notable for the following components:   Sodium 134 (*)    Potassium 5.4 (*)    Chloride 99 (*)    Creatinine, Ser 3.40 (*)    Glucose, Bld 250 (*)    Calcium, Ion 1.00 (*)    Hemoglobin 9.5 (*)    HCT 28.0 (*)    All other components within normal limits  I-STAT TROPONIN, ED - Abnormal; Notable for the following components:   Troponin i, poc 0.42 (*)  All other components within normal limits  CBG MONITORING, ED - Abnormal; Notable for the following components:   Glucose-Capillary 237 (*)    All other  components within normal limits  ETHANOL  RAPID URINE DRUG SCREEN, HOSP PERFORMED  URINALYSIS, ROUTINE W REFLEX MICROSCOPIC    EKG  EKG Interpretation  Date/Time:  10-27-2017 12:59:16 EST Ventricular Rate:  90 PR Interval:    QRS Duration: 87 QT Interval:  370 QTC Calculation: 453 R Axis:   5 Text Interpretation:  Sinus tachycardia Multiple premature complexes, vent & supraven Borderline repolarization abnormality Confirmed by Nat Christen [phone]) on 10/27/17 1:28:26 PM       Radiology Ct Head Code Stroke Wo Contrast  Result Date: 10-27-17 CLINICAL DATA:  Code stroke. Focal neuro deficit. Right-sided weakness. Coumadin. EXAM: CT HEAD WITHOUT CONTRAST TECHNIQUE: Contiguous axial images were obtained from the base of the skull through the vertex without intravenous contrast. COMPARISON:  None. FINDINGS: Brain: Extensive acute subdural hemorrhage in multiple areas. The largest area of hemorrhage is in the left frontal parietal region measuring up to 19 mm in thickness. There is also interhemispheric subdural blood of a moderate degree. Tentorial subdural hemorrhage is present bilaterally as well as subdural blood surrounding the right cerebellar hemisphere. 9 mm midline shift to the right. Effacement of the sulci throughout the hemisphere on the left. Generalized atrophy. Negative for hydrocephalus. Negative for acute ischemic infarction.  Negative for mass. Vascular: Negative for hyperdense vessel Skull: Negative Sinuses/Orbits: Prior sinus surgery. Sinuses are clear. Bilateral cataract removal. Other: None IMPRESSION: 1. Extensive and diffuse subdural hemorrhage most prominent on the left. 9 mm midline shift to the right 2. These results were called by telephone at the time of interpretation on 10/27/17 at 1:07 pm to Dr. Nat Christen , who verbally acknowledged these results. 3. Electronically Signed   By: Franchot Gallo M.D.   On: 10/27/2017 13:08    Procedures Procedures  (including critical care time)  Medications Ordered in ED Medications - No data to display   Initial Impression / Assessment and Plan / ED Course  I have reviewed the triage vital signs and the nursing notes.  Pertinent labs & imaging results that were available during my care of the patient were reviewed by me and considered in my medical decision making (see chart for details).     Patient presents with altered mental status, inability to speak, flaccid right side.  CT head reveals extensive and diffuse subdural hemorrhage more prominent on the left with 9 mm midline shift to the right.  I discussed these findings with the neurosurgeon on-call Dr. Particia Jasper.  It was his opinion that this scenario did not require any neurosurgical intervention.  Family agreed with a DO NOT RESUSCITATE status.  All these findings were discussed with the children and I shared the x-ray findings.   CRITICAL CARE Performed by: Nat Christen Total critical care time: 45 minutes Critical care time was exclusive of separately billable procedures and treating other patients. Critical care was necessary to treat or prevent imminent or life-threatening deterioration. Critical care was time spent personally by me on the following activities: development of treatment plan with patient and/or surrogate as well as nursing, discussions with consultants, evaluation of patient's response to treatment, examination of patient, obtaining history from patient or surrogate, ordering and performing treatments and interventions, ordering and review of laboratory studies, ordering and review of radiographic studies, pulse oximetry and re-evaluation of patient's condition.  Final Clinical Impressions(s) /  ED Diagnoses   Final diagnoses:  Cerebral hemorrhage [facility])    ED Discharge Orders    None       Nat Christen, MD 10-03-2017 1538

## 2017-10-16 NOTE — Consult Note (Signed)
SLP Cancellation Note  Patient Details Name: Destiny Boyd MRN: [id number] DOB: [birth year]   Cancelled treatment:       Reason Eval/Treat Not Completed: Medical issues which prohibited therapy; nursing reported pt is on comfort care and actively dying; please re-consult prn for BSE; will s/o at this time.   Elvina Sidle, M.S., CCC-SLP 2017/10/26, 4:58 PM

## 2017-10-16 DEATH — deceased

## 2017-10-29 ENCOUNTER — Other Ambulatory Visit: Payer: Self-pay | Admitting: *Deleted

## 2017-10-29 NOTE — Patient Outreach (Signed)
[facility]) Care Management  10/29/2017  Destiny Boyd [birth year] [id number]   Care coordination  CM received a request to review [name] for 10/29/17 [facility] multidisciplinary team discussion  Pt reviewed and team informed of the details of [name] passing on 10-17-2017  Wise. Lavina Hamman, RN, BSN, [place] Coordinator [phone] week day mobile

## 2017-12-23 ENCOUNTER — Ambulatory Visit: Payer: Medicare Other | Admitting: Vascular Surgery

## 2018-08-02 IMAGING — DX DG CHEST 2V
2 series · 2 of 2 positions shown · non-contrast
Comparison: 09/16/2017

CLINICAL DATA: Possible left-sided lung nodule

EXAM:
CHEST  2 VIEW

[chest pa]
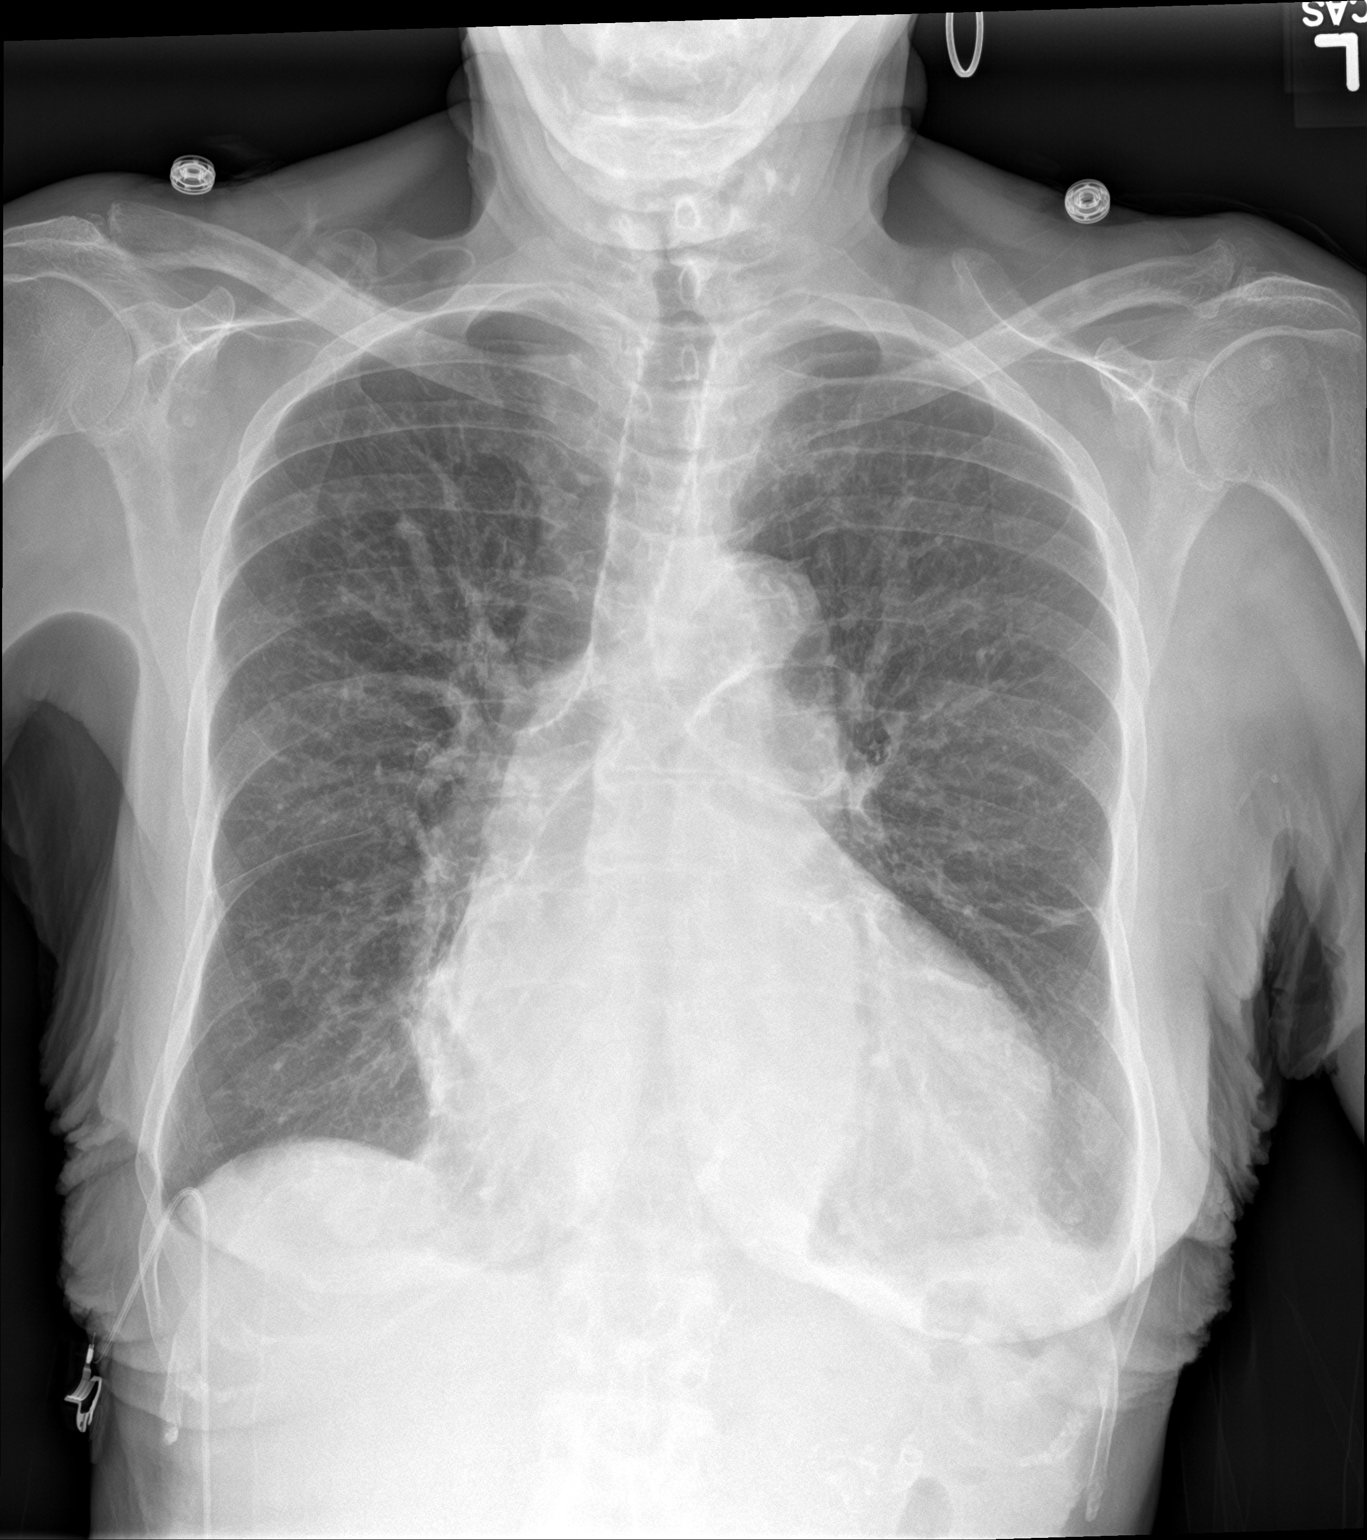

[chest lat]
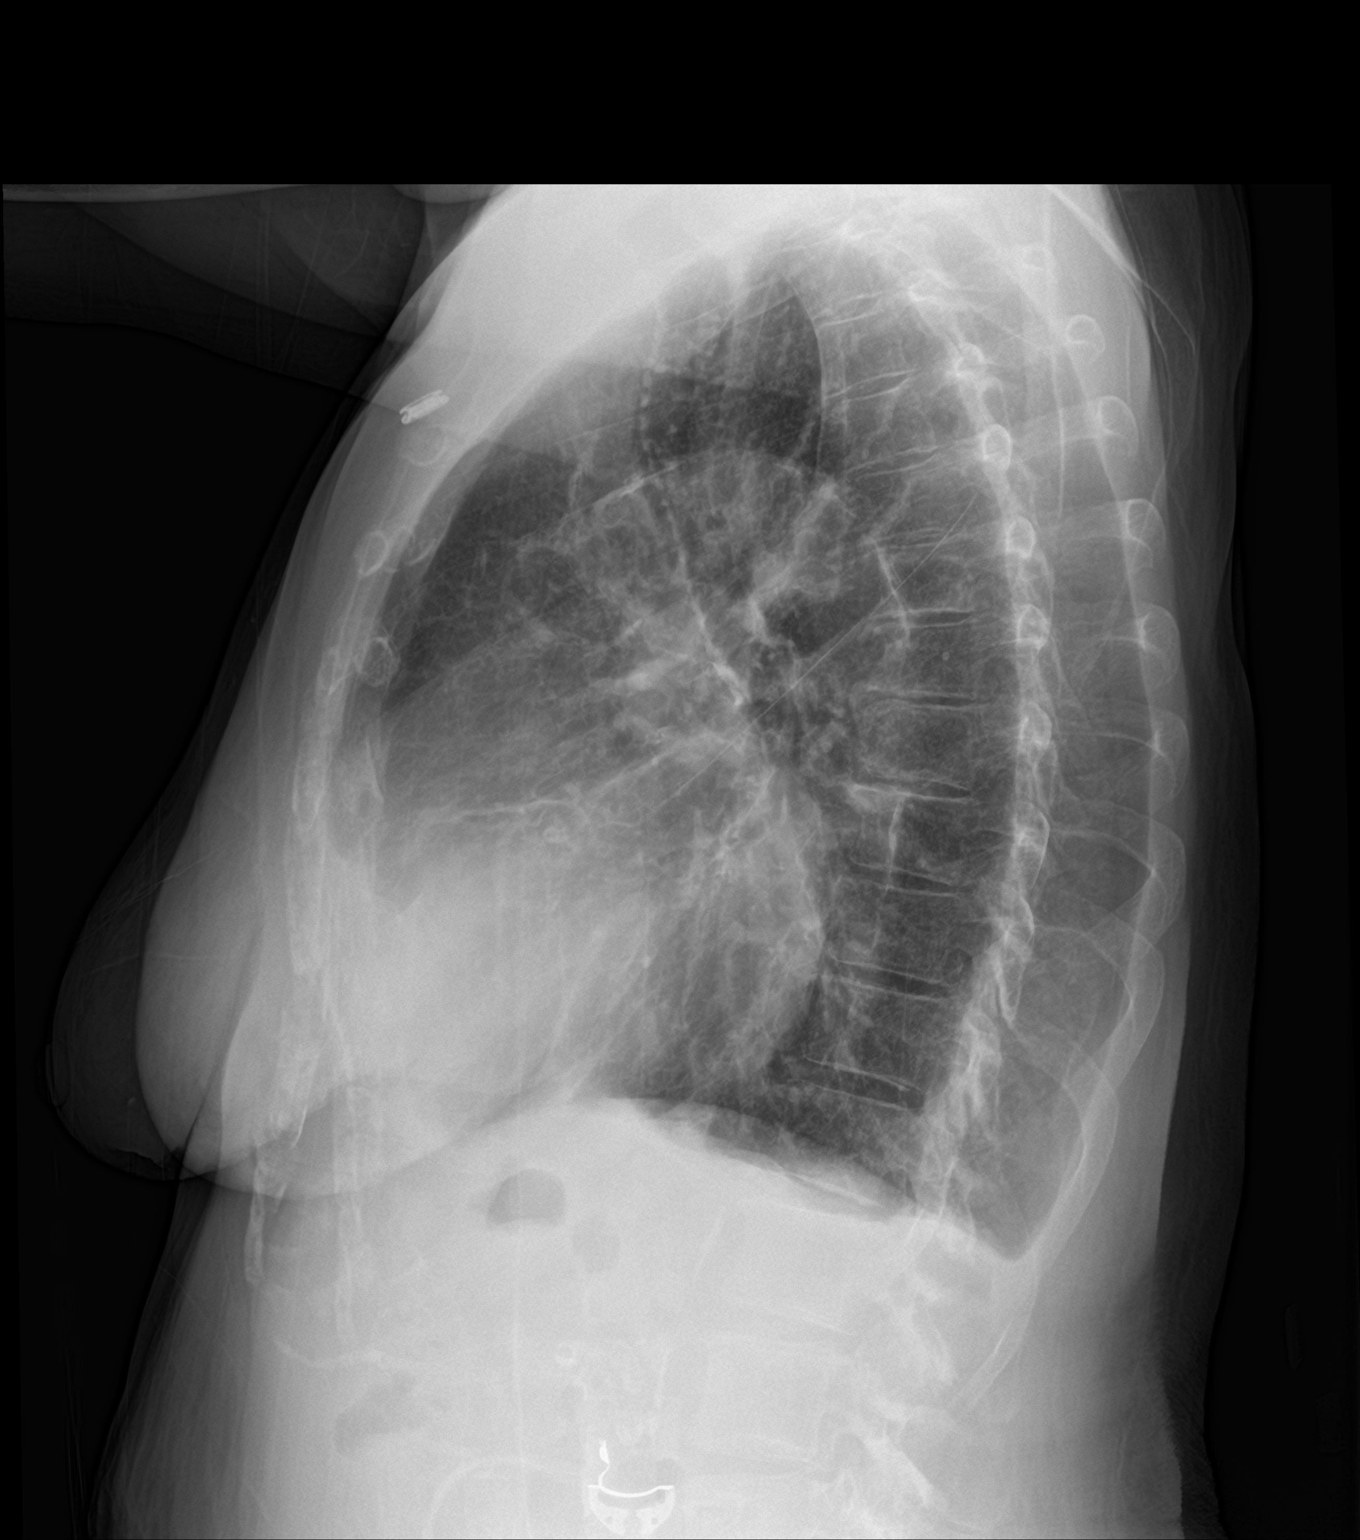

[2 of 2 positions shown; findings below may reference images not displayed]

FINDINGS: Cardiac shadow is enlarged but stable. Aortic calcifications are
again seen. The lungs are hyper aerated without focal infiltrate.
Minimal scarring is noted in the left mid lung. Previously seen
density is related to the EKG lead. No acute abnormality is noted.
IMPRESSION: COPD without acute abnormality. Previously seen density was related
to the overlying extrinsic artifact.

## 2018-08-16 IMAGING — CT CT HEAD CODE STROKE
3 series · 15 of 45 positions shown, 18 images · non-contrast
Comparison: None.

CLINICAL DATA: Code stroke. Focal neuro deficit. Right-sided
weakness. Coumadin.

EXAM:
CT HEAD WITHOUT CONTRAST
TECHNIQUE: Contiguous axial images were obtained from the base of the skull
through the vertex without intravenous contrast.

[Series 2: head code stroke wo · axial · 0.42mm/px · z∈[+50,+166]mm · 9 of 28 slices shown, 12 images]
[im 3/28  brain]
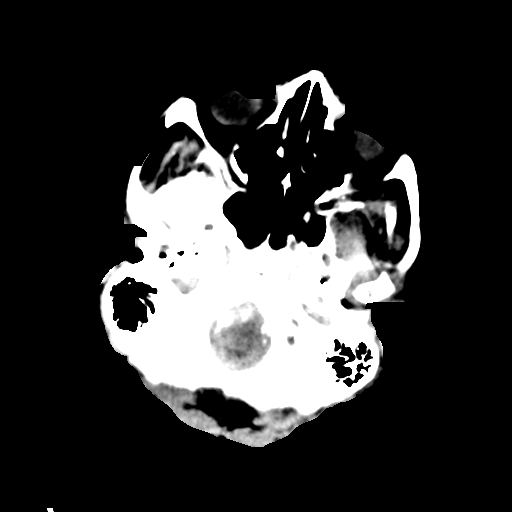
[im 3/28  bone]
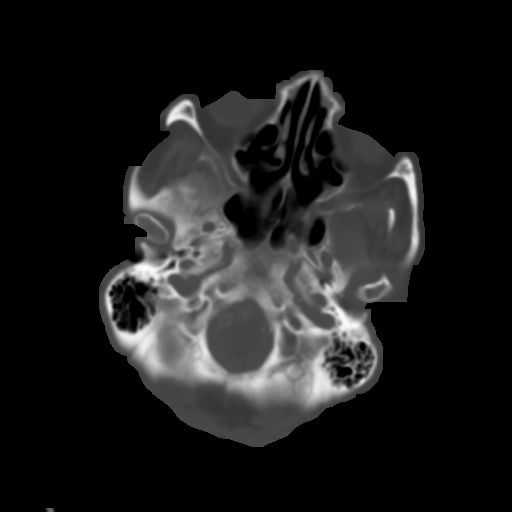
[im 6/28  brain]
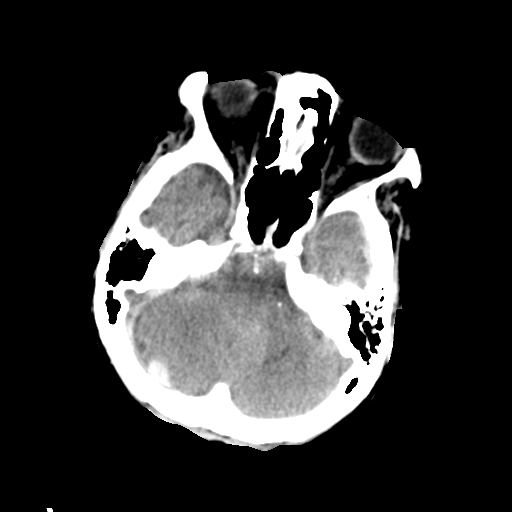
[im 9/28  brain]
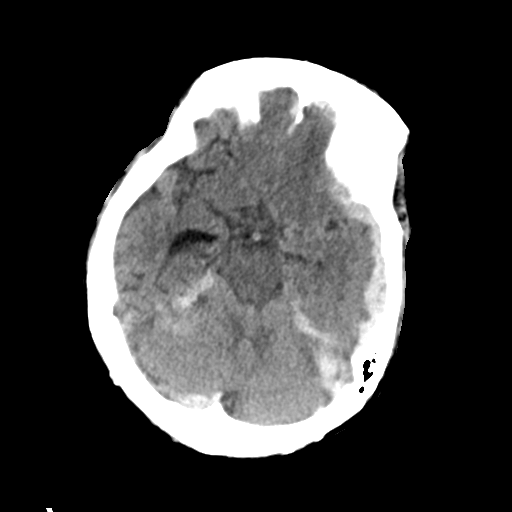
[im 12/28  brain]
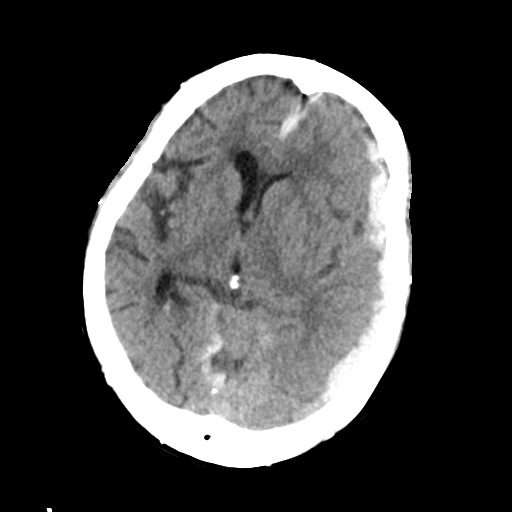
[im 15/28  brain]
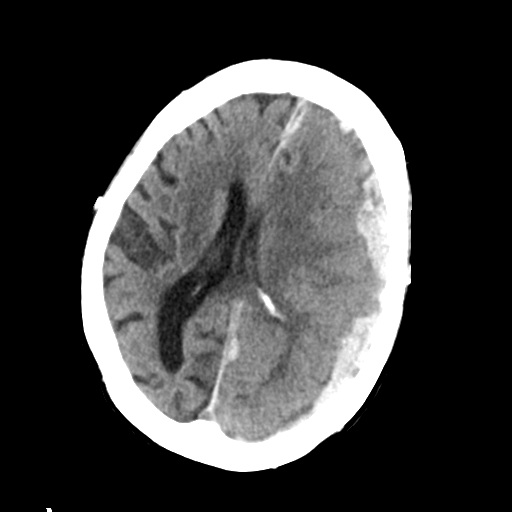
[im 15/28  bone]
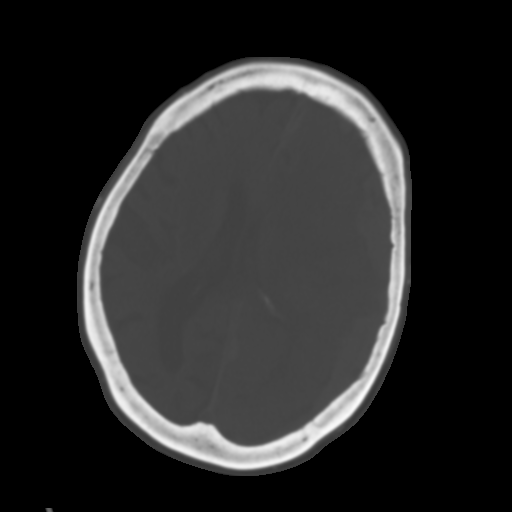
[im 17/28  brain]
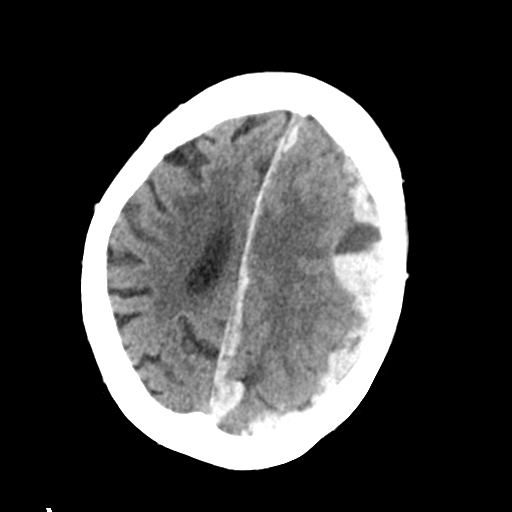
[im 20/28  brain]
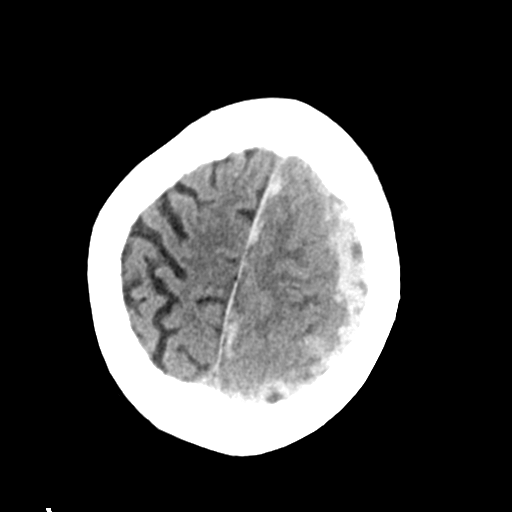
[im 23/28  brain]
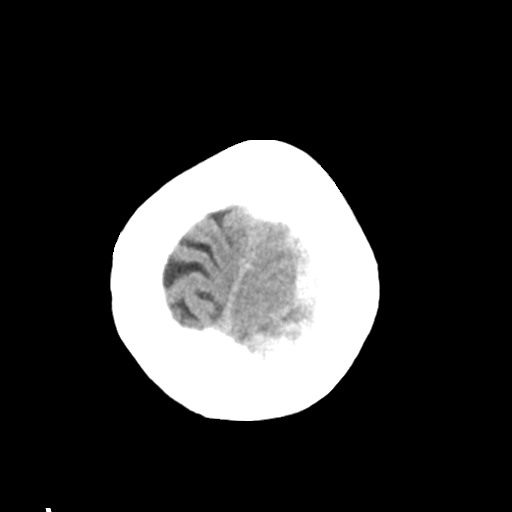
[im 26/28  brain]
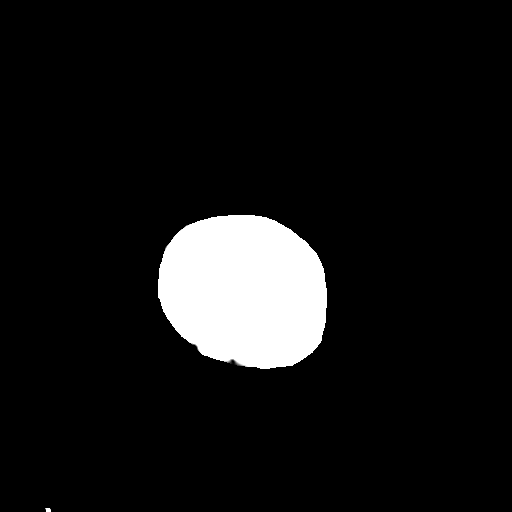
[im 26/28  bone]
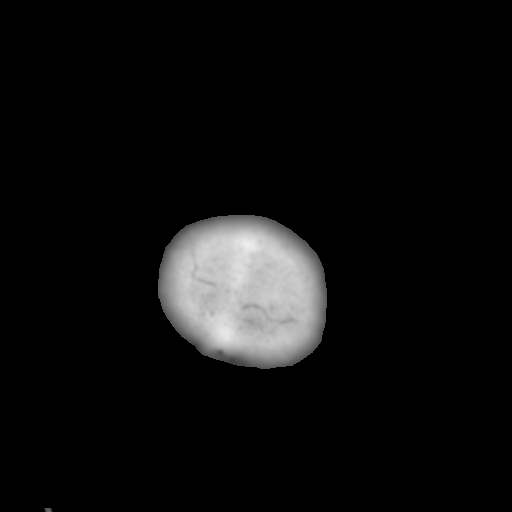

[Series 4: coronal soft tissue · coronal · 0.33mm/px · 3 of 63 slices shown]
[im 21/63  brain]
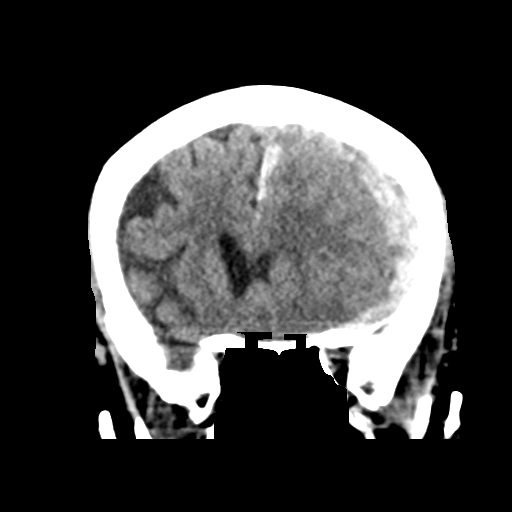
[im 28/63  brain]
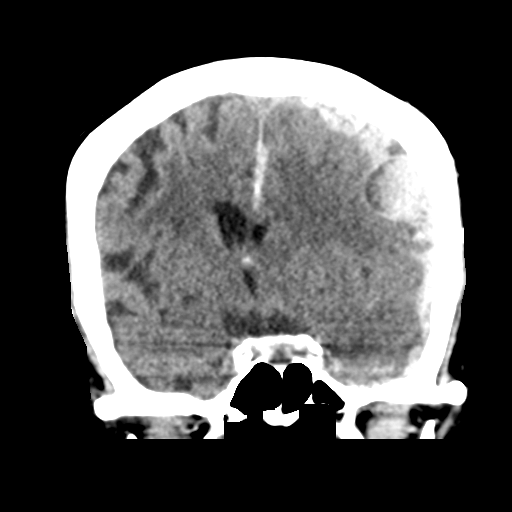
[im 35/63  brain]
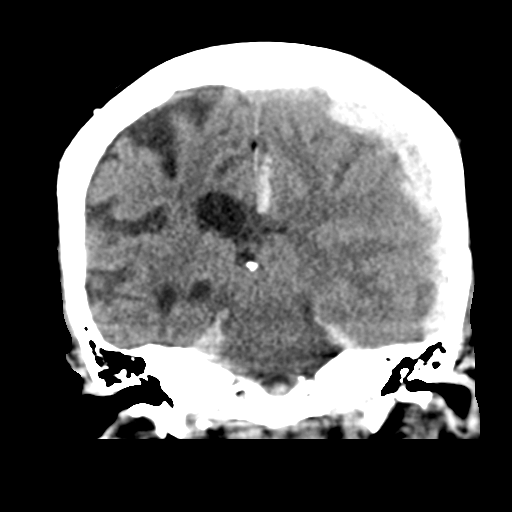

[Series 5: sagittal soft tissue · sagittal · 0.31mm/px · 3 of 61 slices shown]
[im 21/61  brain]
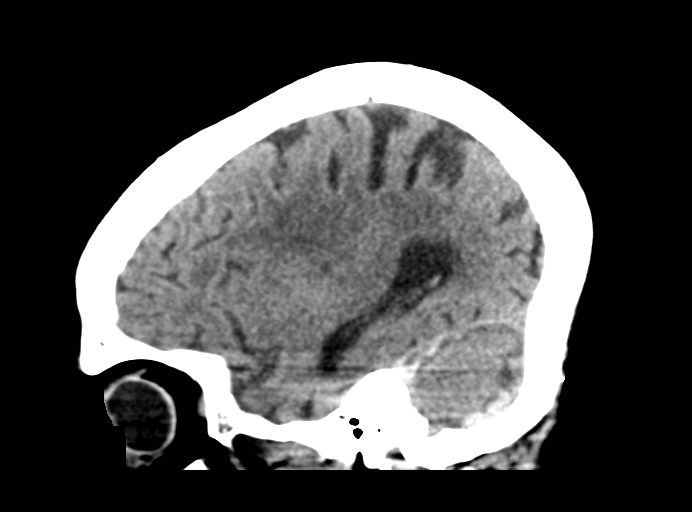
[im 31/61  brain]
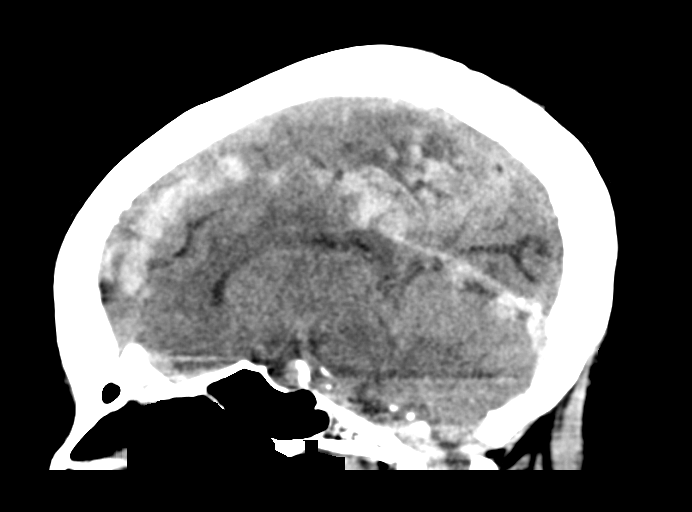
[im 41/61  brain]
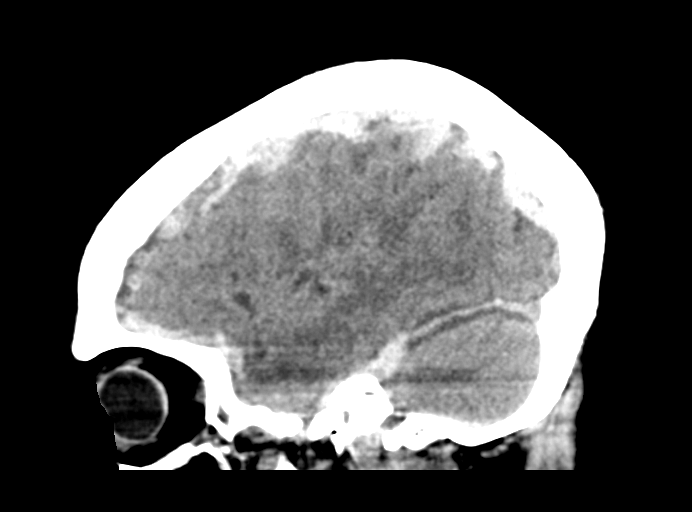

[15 of 45 positions shown; findings below may reference images not displayed]

FINDINGS: Brain: Extensive acute subdural hemorrhage in multiple areas. The
largest area of hemorrhage is in the left frontal parietal region
measuring up to 19 mm in thickness. There is also interhemispheric
subdural blood of a moderate degree. Tentorial subdural hemorrhage
is present bilaterally as well as subdural blood surrounding the
right cerebellar hemisphere.

9 mm midline shift to the right. Effacement of the sulci throughout
the hemisphere on the left. Generalized atrophy. Negative for
hydrocephalus.

Negative for acute ischemic infarction.  Negative for mass.

Vascular: Negative for hyperdense vessel

Skull: Negative

Sinuses/Orbits: Prior sinus surgery. Sinuses are clear. Bilateral
cataract removal.

Other: None
IMPRESSION: 1. Extensive and diffuse subdural hemorrhage most prominent on the
left. 9 mm midline shift to the right
2. These results were called by telephone at the time of
interpretation on 10/01/2017 at [DATE] to Dr. JELIMAN JON , who
verbally acknowledged these results.
[DATE].

## 2019-01-05 ENCOUNTER — Other Ambulatory Visit: Payer: Self-pay | Admitting: *Deleted
